# Patient Record
Sex: Female | Born: 1965
Health system: Southern US, Community
[De-identification: ages and names within clinical notes are randomized; demographics above are authoritative.]

## PROBLEM LIST (undated history)

## (undated) DIAGNOSIS — M1990 Unspecified osteoarthritis, unspecified site: Secondary | ICD-10-CM

## (undated) DIAGNOSIS — E079 Disorder of thyroid, unspecified: Secondary | ICD-10-CM

## (undated) DIAGNOSIS — E119 Type 2 diabetes mellitus without complications: Secondary | ICD-10-CM

---

## 1991-05-28 HISTORY — PX: APPENDECTOMY: SHX54

## 1996-05-27 HISTORY — PX: TONSILLECTOMY: SUR1361

## 2006-05-27 HISTORY — PX: KNEE ARTHROSCOPY W/ MENISCAL REPAIR: SHX1877

## 2013-05-27 HISTORY — PX: ROTATOR CUFF REPAIR: SHX139

## 2014-01-02 ENCOUNTER — Emergency Department: Payer: Self-pay | Admitting: Emergency Medicine

## 2014-02-12 ENCOUNTER — Emergency Department: Payer: Self-pay

## 2014-02-12 LAB — CBC
HCT: 42.7 % (ref 35.0–47.0)
HGB: 14.4 g/dL (ref 12.0–16.0)
MCH: 31.6 pg (ref 26.0–34.0)
MCHC: 33.7 g/dL (ref 32.0–36.0)
MCV: 94 fL (ref 80–100)
Platelet: 220 10*3/uL (ref 150–440)
RBC: 4.56 10*6/uL (ref 3.80–5.20)
RDW: 12 % (ref 11.5–14.5)
WBC: 10.6 10*3/uL (ref 3.6–11.0)

## 2014-02-12 LAB — COMPREHENSIVE METABOLIC PANEL
Albumin: 3.7 g/dL (ref 3.4–5.0)
Alkaline Phosphatase: 76 U/L
Anion Gap: 11 (ref 7–16)
BUN: 19 mg/dL — ABNORMAL HIGH (ref 7–18)
Bilirubin,Total: 0.4 mg/dL (ref 0.2–1.0)
Calcium, Total: 9.4 mg/dL (ref 8.5–10.1)
Chloride: 98 mmol/L (ref 98–107)
Co2: 23 mmol/L (ref 21–32)
Creatinine: 1.04 mg/dL (ref 0.60–1.30)
EGFR (African American): >60
EGFR (Non-African Amer.): >60
Glucose: 418 mg/dL — ABNORMAL HIGH (ref 65–99)
Osmolality: 285 (ref 275–301)
Potassium: 3.8 mmol/L (ref 3.5–5.1)
SGOT(AST): 11 U/L — ABNORMAL LOW (ref 15–37)
SGPT (ALT): 14 U/L
Sodium: 132 mmol/L — ABNORMAL LOW (ref 136–145)
Total Protein: 6.7 g/dL (ref 6.4–8.2)

## 2014-02-12 LAB — URINALYSIS, COMPLETE
Bacteria: NONE SEEN
Bilirubin,UR: NEGATIVE
Blood: NEGATIVE
Glucose,UR: >=500 mg/dL (ref 0–75)
Leukocyte Esterase: NEGATIVE
Nitrite: NEGATIVE
Ph: 5 (ref 4.5–8.0)
Protein: NEGATIVE
RBC,UR: 1 /HPF (ref 0–5)
Specific Gravity: 1.026 (ref 1.003–1.030)
Squamous Epithelial: 1
WBC UR: 1 /HPF (ref 0–5)

## 2014-02-12 LAB — T4, FREE: Free Thyroxine: 1.82 ng/dL — ABNORMAL HIGH (ref 0.76–1.46)

## 2014-02-12 LAB — TSH: Thyroid Stimulating Horm: 0.014 u[IU]/mL — ABNORMAL LOW

## 2014-02-19 ENCOUNTER — Ambulatory Visit: Payer: Self-pay | Admitting: Specialist

## 2014-03-09 ENCOUNTER — Ambulatory Visit: Payer: Self-pay | Admitting: Specialist

## 2014-06-30 ENCOUNTER — Emergency Department: Payer: Self-pay | Admitting: Student

## 2014-06-30 LAB — CBC
HCT: 45.2 % (ref 35.0–47.0)
HGB: 15.2 g/dL (ref 12.0–16.0)
MCH: 31.9 pg (ref 26.0–34.0)
MCHC: 33.6 g/dL (ref 32.0–36.0)
MCV: 95 fL (ref 80–100)
Platelet: 226 10*3/uL (ref 150–440)
RBC: 4.77 10*6/uL (ref 3.80–5.20)
RDW: 12.9 % (ref 11.5–14.5)
WBC: 9 10*3/uL (ref 3.6–11.0)

## 2014-06-30 LAB — BASIC METABOLIC PANEL
Anion Gap: 9 (ref 7–16)
BUN: 20 mg/dL — ABNORMAL HIGH (ref 7–18)
Calcium, Total: 9.1 mg/dL (ref 8.5–10.1)
Chloride: 102 mmol/L (ref 98–107)
Co2: 24 mmol/L (ref 21–32)
Creatinine: 0.79 mg/dL (ref 0.60–1.30)
EGFR (African American): >60
EGFR (Non-African Amer.): >60
Glucose: 335 mg/dL — ABNORMAL HIGH (ref 65–99)
Osmolality: 286 (ref 275–301)
Potassium: 4.6 mmol/L (ref 3.5–5.1)
Sodium: 135 mmol/L — ABNORMAL LOW (ref 136–145)

## 2014-06-30 LAB — TROPONIN I
Troponin-I: <0.02 ng/mL
Troponin-I: <0.02 ng/mL

## 2014-06-30 LAB — HCG, QUANTITATIVE, PREGNANCY: Beta Hcg, Quant.: 4 m[IU]/mL — ABNORMAL HIGH

## 2014-09-17 NOTE — Op Note (Signed)
PATIENT NAME:  Tanya Graves, Tanya Graves MR#:  [id number] DATE OF BIRTH:  1965/07/04  DATE OF PROCEDURE:  03/09/2014  PREOPERATIVE DIAGNOSIS: Large tear of the right rotator cuff supraspinatus and infraspinatus following anterior right shoulder dislocation 2 months ago.   OPERATION: Mini open right rotator cuff repair with arthroscopic debridement aunt of the shoulder.   SURGEON: Park Breed, MD   ANESTHESIA: General endotracheal plus interscalene block.   COMPLICATIONS: None.   DRAINS: None.   ESTIMATED BLOOD LOSS: Minimal.   REPLACEMENTS: None.   OPERATIVE FINDINGS: The patient had a very large tear of the supraspinatus and infraspinatus tendons. They were slightly retracted. The bone quality in the greater tuberosity was extremely poor and would not hold anchors well. The joint showed an intact biceps tendon and intact glenohumeral surfaces. The labrum was intact anteriorly. There was some bursitis. The coracoacromial ligament was intact.   OPERATIVE PROCEDURE: The patient was brought to the Operating Room where she underwent satisfactory general endotracheal anesthesia as well as an interscalene block on her right. She was placed in the left lateral decubitus position and padded appropriately. The right shoulder was prepped and draped in sterile fashion. Arthroscopy was carried out from a posterior portal with accessory portals being made laterally and anteriorly. The above findings were encountered on thorough arthroscopy of the joint and the subacromial space. The subacromial space was prepared with a motorized resector and ArthroCare to remove bursal tissue. The 3 Magnum wires were introduced into the rotator cuff and brought out the anterior portal. The bur was used to gently debride the greater tuberosity. We then attempted to introduce the speed screw tamp into the bone of the greater tuberosity. This proved to be fruitless as the bone was too to hold the tamps well. We attempted to put 1  speed screw in posteriorly, but it would not hold and it was removed. At this point, I elected to proceed with a mini open procedure and extended the anterolateral incision proximally and distally. The muscle was spread exposing the lateral aspect of the greater tuberosity. Further bursectomy was carried out. The 3 sutures in the rotator cuff were brought out laterally. Using another Magnum wire posteriorly woven through the tendon, all 3 of these sutures were then brought out through bone tunnels laterally over the good hard bone of the lateral portion of the greater tuberosity. The traction was reduced to 5 pounds. All 3 sutures were then tied over the lateral portion of the greater tuberosity bringing the rotator cuff down nicely. The wound was thoroughly irrigated. There was good coverage and return of the rotator cuff to the tuberosity region. The fascia was closed with 0 Vicryl and the subcutaneous tissue was closed with 2-0 Vicryl. All skin was closed with staples. Marcaine 0.5 with epinephrine and morphine was placed in the joint. Dry sterile dressing with TENS pads was applied. A padded and sling was applied. The patient was awakened and taken to recovery in good condition.    ____________________________ Park Breed, MD hem:TT D: 03/09/2014 11:30:35 ET T: 03/09/2014 14:28:11 ET JOB#: [id number]  cc: Park Breed, MD, <Dictator> Park Breed MD ELECTRONICALLY SIGNED 03/09/2014 22:24

## 2015-01-22 ENCOUNTER — Inpatient Hospital Stay
Admission: EM | Admit: 2015-01-22 | Discharge: 2015-01-25 | DRG: 854 | Disposition: A | Discharge status: Home / Self Care | Payer: 59 | Attending: Internal Medicine | Admitting: Internal Medicine

## 2015-01-22 ENCOUNTER — Encounter: Payer: Self-pay | Admitting: Emergency Medicine

## 2015-01-22 DIAGNOSIS — F17210 Nicotine dependence, cigarettes, uncomplicated: Secondary | ICD-10-CM | POA: Diagnosis present

## 2015-01-22 DIAGNOSIS — E039 Hypothyroidism, unspecified: Secondary | ICD-10-CM | POA: Diagnosis present

## 2015-01-22 DIAGNOSIS — E079 Disorder of thyroid, unspecified: Secondary | ICD-10-CM | POA: Diagnosis present

## 2015-01-22 DIAGNOSIS — Z79899 Other long term (current) drug therapy: Secondary | ICD-10-CM

## 2015-01-22 DIAGNOSIS — R739 Hyperglycemia, unspecified: Secondary | ICD-10-CM | POA: Diagnosis present

## 2015-01-22 DIAGNOSIS — Z794 Long term (current) use of insulin: Secondary | ICD-10-CM | POA: Diagnosis not present

## 2015-01-22 DIAGNOSIS — A419 Sepsis, unspecified organism: Principal | ICD-10-CM | POA: Diagnosis present

## 2015-01-22 DIAGNOSIS — E1165 Type 2 diabetes mellitus with hyperglycemia: Secondary | ICD-10-CM | POA: Diagnosis present

## 2015-01-22 DIAGNOSIS — Z833 Family history of diabetes mellitus: Secondary | ICD-10-CM | POA: Diagnosis not present

## 2015-01-22 DIAGNOSIS — E108 Type 1 diabetes mellitus with unspecified complications: Secondary | ICD-10-CM

## 2015-01-22 DIAGNOSIS — Z9889 Other specified postprocedural states: Secondary | ICD-10-CM

## 2015-01-22 DIAGNOSIS — Z9049 Acquired absence of other specified parts of digestive tract: Secondary | ICD-10-CM | POA: Diagnosis present

## 2015-01-22 DIAGNOSIS — L02415 Cutaneous abscess of right lower limb: Secondary | ICD-10-CM

## 2015-01-22 DIAGNOSIS — E119 Type 2 diabetes mellitus without complications: Secondary | ICD-10-CM | POA: Diagnosis not present

## 2015-01-22 DIAGNOSIS — L02214 Cutaneous abscess of groin: Secondary | ICD-10-CM | POA: Diagnosis present

## 2015-01-22 DIAGNOSIS — L02215 Cutaneous abscess of perineum: Secondary | ICD-10-CM | POA: Diagnosis present

## 2015-01-22 DIAGNOSIS — L03115 Cellulitis of right lower limb: Secondary | ICD-10-CM | POA: Diagnosis present

## 2015-01-22 DIAGNOSIS — Z8249 Family history of ischemic heart disease and other diseases of the circulatory system: Secondary | ICD-10-CM

## 2015-01-22 HISTORY — DX: Disorder of thyroid, unspecified: E07.9

## 2015-01-22 HISTORY — DX: Type 2 diabetes mellitus without complications: E11.9

## 2015-01-22 LAB — LACTIC ACID, PLASMA: Lactic Acid, Venous: 1.4 mmol/L (ref 0.5–2.0)

## 2015-01-22 LAB — URINALYSIS COMPLETE WITH MICROSCOPIC (ARMC ONLY)
Bacteria, UA: NONE SEEN
Bilirubin Urine: NEGATIVE
Glucose, UA: >500 mg/dL — AB
Leukocytes, UA: NEGATIVE
Nitrite: NEGATIVE
Protein, ur: NEGATIVE mg/dL
Specific Gravity, Urine: 1.033 — ABNORMAL HIGH (ref 1.005–1.030)
WBC, UA: NONE SEEN WBC/hpf (ref 0–5)
pH: 6 (ref 5.0–8.0)

## 2015-01-22 LAB — BASIC METABOLIC PANEL
Anion gap: 10 (ref 5–15)
BUN: 18 mg/dL (ref 6–20)
CO2: 23 mmol/L (ref 22–32)
Calcium: 9.5 mg/dL (ref 8.9–10.3)
Chloride: 97 mmol/L — ABNORMAL LOW (ref 101–111)
Creatinine, Ser: 0.94 mg/dL (ref 0.44–1.00)
GFR calc Af Amer: >60 mL/min (ref >60)
GFR calc non Af Amer: >60 mL/min (ref >60)
Glucose, Bld: 508 mg/dL (ref 65–99)
Potassium: 4.1 mmol/L (ref 3.5–5.1)
Sodium: 130 mmol/L — ABNORMAL LOW (ref 135–145)

## 2015-01-22 LAB — CBC
HCT: 44.4 % (ref 35.0–47.0)
Hemoglobin: 15.1 g/dL (ref 12.0–16.0)
MCH: 32.1 pg (ref 26.0–34.0)
MCHC: 34.1 g/dL (ref 32.0–36.0)
MCV: 93.9 fL (ref 80.0–100.0)
Platelets: 231 10*3/uL (ref 150–440)
RBC: 4.72 MIL/uL (ref 3.80–5.20)
RDW: 12.9 % (ref 11.5–14.5)
WBC: 14.7 10*3/uL — ABNORMAL HIGH (ref 3.6–11.0)

## 2015-01-22 LAB — GLUCOSE, CAPILLARY: Glucose-Capillary: 519 mg/dL — ABNORMAL HIGH (ref 65–99)

## 2015-01-22 MED ORDER — VANCOMYCIN HCL IN DEXTROSE 1-5 GM/200ML-% IV SOLN
1000.0000 mg | Freq: Once | INTRAVENOUS | Status: AC
  Administered 2015-01-22: 1000 mg via INTRAVENOUS
  Filled 2015-01-22: qty 200

## 2015-01-22 MED ORDER — LIDOCAINE-EPINEPHRINE 2 %-1:100000 IJ SOLN
1.7000 mL | Freq: Once | INTRAMUSCULAR | Status: AC
  Administered 2015-01-22: 1.7 mL via INTRADERMAL
  Filled 2015-01-22: qty 1.7

## 2015-01-22 MED ORDER — ACETAMINOPHEN 650 MG RE SUPP: 650.0000 mg | Freq: Four times a day (QID) | RECTAL | Status: DC | PRN

## 2015-01-22 MED ORDER — MORPHINE SULFATE (PF) 2 MG/ML IV SOLN
2.0000 mg | INTRAVENOUS | Status: DC | PRN
  Administered 2015-01-24 – 2015-01-25 (×3): 2 mg via INTRAVENOUS
  Filled 2015-01-22 (×3): qty 1

## 2015-01-22 MED ORDER — SODIUM CHLORIDE 0.9 % IV BOLUS (SEPSIS)
1000.0000 mL | Freq: Once | INTRAVENOUS | Status: AC
Start: 2015-01-22 — End: 2015-01-23
  Administered 2015-01-23: 1000 mL via INTRAVENOUS

## 2015-01-22 MED ORDER — HEPARIN SODIUM (PORCINE) 5000 UNIT/ML IJ SOLN
5000.0000 [IU] | Freq: Three times a day (TID) | INTRAMUSCULAR | Status: DC
  Administered 2015-01-22 – 2015-01-25 (×6): 5000 [IU] via SUBCUTANEOUS
  Filled 2015-01-22 (×7): qty 1

## 2015-01-22 MED ORDER — ACETAMINOPHEN 325 MG PO TABS: 650.0000 mg | ORAL_TABLET | Freq: Four times a day (QID) | ORAL | Status: DC | PRN

## 2015-01-22 MED ORDER — SODIUM CHLORIDE 0.9 % IV SOLN
INTRAVENOUS | Status: DC
  Administered 2015-01-23 – 2015-01-24 (×6): via INTRAVENOUS

## 2015-01-22 MED ORDER — SODIUM CHLORIDE 0.9 % IV BOLUS (SEPSIS)
1000.0000 mL | Freq: Once | INTRAVENOUS | Status: AC
  Administered 2015-01-22: 1000 mL via INTRAVENOUS

## 2015-01-22 MED ORDER — SODIUM CHLORIDE 0.9 % IV BOLUS (SEPSIS)
1000.0000 mL | Freq: Once | INTRAVENOUS | Status: AC
  Administered 2015-01-23: 1000 mL via INTRAVENOUS

## 2015-01-22 MED ORDER — SODIUM CHLORIDE 0.9 % IJ SOLN
3.0000 mL | Freq: Two times a day (BID) | INTRAMUSCULAR | Status: DC
  Administered 2015-01-22 – 2015-01-24 (×2): 3 mL via INTRAVENOUS

## 2015-01-22 MED ORDER — OXYCODONE HCL 5 MG PO TABS
5.0000 mg | ORAL_TABLET | ORAL | Status: DC | PRN
  Administered 2015-01-22 – 2015-01-25 (×7): 5 mg via ORAL
  Filled 2015-01-22 (×7): qty 1

## 2015-01-22 MED ORDER — ONDANSETRON HCL 4 MG PO TABS: 4.0000 mg | ORAL_TABLET | Freq: Four times a day (QID) | ORAL | Status: DC | PRN

## 2015-01-22 MED ORDER — CLINDAMYCIN PHOSPHATE 600 MG/50ML IV SOLN
600.0000 mg | Freq: Once | INTRAVENOUS | Status: AC
  Administered 2015-01-22: 600 mg via INTRAVENOUS
  Filled 2015-01-22: qty 50

## 2015-01-22 MED ORDER — ONDANSETRON HCL 4 MG/2ML IJ SOLN: 4.0000 mg | Freq: Four times a day (QID) | INTRAMUSCULAR | Status: DC | PRN

## 2015-01-22 NOTE — ED Provider Notes (Signed)
[facility] Emergency Department Provider Note   ____________________________________________  Time seen: 8:45 PM I have reviewed the triage vital signs and the triage nursing note.  HISTORY  Chief Complaint Dizziness   Historian Patient  HPI Tanya Graves is a 49 y.o. female who has a history of insulin-dependent diabetes, and history of remote [place], who noticed a small area of tenderness and inflammation consistent with a boil on Friday, and a third one popped up and seems to be worsening. The pain in the right inner thigh boil is an 8 out of 10 currently. Patient mostly came in because her blood sugars were elevated today in the 500s and even after she gave herself an additional 5 units fast acting insulin, her blood sugar did not come down except into the 400s. She denies any fevers. She denies syncope or passing out, but is positive for dizziness. No chest pain shortness of breath, trouble breathing, or cough. No urinary symptoms. Symptoms are moderate.    Past Medical History  Diagnosis Date  . Diabetes mellitus without complication   . Thyroid disease     There are no active problems to display for this patient.   Past Surgical History  Procedure Laterality Date  . Rotator cuff repair    . Appendectomy      No current outpatient prescriptions on file. metformin, long-acting insulin, and short-acting insulin as needed for a sliding scale  Allergies Review of patient's allergies indicates no known allergies.  Family History  Problem Relation Age of Onset  . Diabetes Father   . Heart failure Father   . Diabetes Brother   . Diabetes Other     Social History Social History  Substance Use Topics  . Smoking status: Current Every Day Smoker -- 0.25 packs/day    Types: Cigarettes  . Smokeless tobacco: None  . Alcohol Use: No    Review of Systems  Constitutional: Negative for fever. Eyes: Negative for visual changes. ENT: Negative  for sore throat. Cardiovascular: Negative for chest pain. Respiratory: Negative for shortness of breath. Gastrointestinal: Negative for abdominal pain, vomiting and diarrhea. Genitourinary: Negative for dysuria. Musculoskeletal: Negative for back pain. Skin: Positive for boils in the groin. Neurological: Negative for headache. 10 point Review of Systems otherwise negative ____________________________________________   PHYSICAL EXAM:  VITAL SIGNS: ED Triage Vitals  Enc Vitals Group     BP 01/22/15 2024 135/90 mmHg     Pulse Rate 01/22/15 2024 128     Resp 01/22/15 2024 18     Temp 01/22/15 2024 98.2 F (36.8 C)     Temp src --      SpO2 01/22/15 2024 96 %     Weight 01/22/15 2024 271 lb (122.925 kg)     Height 01/22/15 2024 5\' 8"  (1.727 m)     Head Cir --      Peak Flow --      Pain Score 01/22/15 2025 8     Pain Loc --      Pain Edu? --      Excl. in [place]? --      Constitutional: Alert and oriented. Anxious. Well appearing and overall, and in no distress. Eyes: Conjunctivae are normal. PERRL. Normal extraocular movements. ENT   Head: Normocephalic and atraumatic.   Nose: No congestion/rhinnorhea.   Mouth/Throat: Mucous membranes are moist.   Neck: No stridor. Cardiovascular/Chest: Tachycardic, regular.  No murmurs, rubs, or gallops. Respiratory: Normal respiratory effort without tachypnea nor retractions. Breath sounds  are clear and equal bilaterally. No wheezes/rales/rhonchi. Gastrointestinal: Soft. No distention, no guarding, no rebound. Nontender   Genitourinary/rectal: Two tiny boils which are draining on the left groin. Right inner groin area of fluctuance and pointing with some surrounding tenderness. No additional skin rash. No crepitus. Musculoskeletal: Nontender with normal range of motion in all extremities. No joint effusions.  No lower extremity tenderness.  No edema. Neurologic:  Normal speech and language. No gross or focal neurologic deficits  are appreciated. Skin:  Skin is warm, dry and intact. No rash noted. Psychiatric: Mood and affect are normal. Speech and behavior are normal. Patient exhibits appropriate insight and judgment.  ____________________________________________   EKG I, Lisa Roca, MD, the attending physician have personally viewed and interpreted all ECGs.  Sinus tachycardia. On the trauma beats per minute. Narrow QRS. Normal axis. Normal ST and T-wave. ____________________________________________  LABS (pertinent positives/negatives)  Lactic acid 1.4 Basic metabolic panel significant for glucose 508, sodium 1:30 and chloride 97 without significant other abnormality White blood cell count is 14.7, hemoglobin 15.1, platelets 231 Urinalysis 1+ ketones and negative for signs of urinary tract infection  ____________________________________________  RADIOLOGY All Xrays were viewed by me. Imaging interpreted by Radiologist.  None __________________________________________  PROCEDURES  Procedure(s) performed: Performed by Dr. Lisa Roca M.D. Abscess I&D, right inner thigh. Skin cleaned with Betadine. #11 blade incised fluctuant/pointing abscess.  Critical Care performed: None   ____________________________________________   ED COURSE / ASSESSMENT AND PLAN  CONSULTATIONS: Face-to-face with hospitalist for admission.  Pertinent labs & imaging results that were available during my care of the patient were reviewed by me and considered in my medical decision making (see chart for details).  Patient is here tachycardic and hyperglycemic with multiple small boils and one that is more extensive and lanced by me here in the emergency department. Only a small amount of pus was drained. Area of induration is relatively large and with her elevated white blood cell count, hyperglycemia and tachycardia, I'm concerned for cellulitis and/or potentially early sepsis. No evidence of DKA. Normal saline bolus was  started. Lactate is normal. Patient was started on antibiotic clindamycin.  Patient / Family / Caregiver informed of clinical course, medical decision-making process, and agree with plan.   ___________________________________________   FINAL CLINICAL IMPRESSION(S) / ED DIAGNOSES   Final diagnoses:  Hyperglycemia  Abscess of right thigh  Cellulitis of right thigh       Lisa Roca, MD 01/22/15 2239

## 2015-01-22 NOTE — ED Notes (Signed)
Pt. States she is insulin diabetic.  Pt. States reading on her machine read 467.  Pt. Also states "I have a boils to inner upper legs".

## 2015-01-22 NOTE — H&P (Signed)
Tanya Graves at [place] NAME: Tanya Graves    MR#:  [id number]  DATE OF BIRTH:  02-28-1966   DATE OF ADMISSION:  01/22/2015  PRIMARY CARE PHYSICIAN: No primary care provider on file.   REQUESTING/REFERRING PHYSICIAN: Lord  CHIEF COMPLAINT:   Chief Complaint  Patient presents with  . Dizziness    Pt. states BS is high.  Pt. states her machine read 467.  Pt. also states 3 boils on inside of upper legs.    HISTORY OF PRESENT ILLNESS:  Tanya Graves  is a 49 y.o. female with a known history of type 2 diabetes uncomplicated presenting with elevated blood glucose. She describes approximately 2-3 day duration of elevated blood glucose readings with associated increased thirst and increased urination. She also denotes having "3 boils" on the inside of her thighs. The  largest being inside of her right thigh, red, painful described as "pain" nonradiating and worse with touching no relieving factors nonradiating. Denies fevers or chills  PAST MEDICAL HISTORY:   Past Medical History  Diagnosis Date  . Diabetes mellitus without complication   . Thyroid disease     PAST SURGICAL HISTORY:   Past Surgical History  Procedure Laterality Date  . Rotator cuff repair    . Appendectomy      SOCIAL HISTORY:   Social History  Substance Use Topics  . Smoking status: Current Every Day Smoker -- 0.25 packs/day    Types: Cigarettes  . Smokeless tobacco: Not on file  . Alcohol Use: No    FAMILY HISTORY:   Family History  Problem Relation Age of Onset  . Diabetes Father   . Heart failure Father   . Diabetes Brother   . Diabetes Other     DRUG ALLERGIES:  No Known Allergies  REVIEW OF SYSTEMS:  REVIEW OF SYSTEMS:  CONSTITUTIONAL: Denies fevers, chills, fatigue, weakness.  EYES: Denies blurred vision, double vision, or eye pain.  EARS, NOSE, THROAT: Denies tinnitus, ear pain, hearing loss.  RESPIRATORY: denies cough, shortness  of breath, wheezing  CARDIOVASCULAR: Denies chest pain, palpitations, edema.  GASTROINTESTINAL: Denies nausea, vomiting, diarrhea, abdominal pain.  GENITOURINARY: Denies dysuria, hematuria.  ENDOCRINE: Denies nocturia or thyroid problems. HEMATOLOGIC AND LYMPHATIC: Denies easy bruising or bleeding.  SKIN: Skin lesions as described above Denies further rash or lesions.  MUSCULOSKELETAL: Denies pain in neck, back, shoulder, knees, hips, or further arthritic symptoms.  NEUROLOGIC: Denies paralysis, paresthesias.  PSYCHIATRIC: Denies anxiety or depressive symptoms. Otherwise full review of systems performed by me is negative.   MEDICATIONS AT HOME:   Prior to Admission medications   Medication Sig Start Date End Date Taking? Authorizing Provider  Insulin Glargine (TOUJEO SOLOSTAR [place]) Inject 45 Units into the skin every morning.   Yes Historical Provider, MD  insulin lispro (HUMALOG) 100 UNIT/ML injection Inject 4-11 Units into the skin 3 (three) times daily before meals. Sliding scale per blood sugar readings. BS100-150=4, 151-200=5, 201-250=7, 251-300=8, 301-350=9, over 350=11   Yes Historical Provider, MD  levothyroxine (SYNTHROID, LEVOTHROID) 200 MCG tablet Take 200 mcg by mouth daily before breakfast.   Yes Historical Provider, MD  metFORMIN (GLUCOPHAGE) 500 MG tablet Take 500 mg by mouth 2 (two) times daily with a meal.   Yes Historical Provider, MD      VITAL SIGNS:  Blood pressure 102/76, pulse 99, temperature 98.2 F (36.8 C), resp. rate 16, height 5\' 8"  (1.727 m), weight 271 lb (122.925 kg), last menstrual period  01/16/2015, SpO2 97 %.  PHYSICAL EXAMINATION:  VITAL SIGNS: Filed Vitals:   01/22/15 2230  BP: 102/76  Pulse: 99  Temp:   Resp: 46   GENERAL:49 y.o.female currently in no acute distress.  HEAD: Normocephalic, atraumatic.  EYES: Pupils equal, round, reactive to light. Extraocular muscles intact. No scleral icterus.  MOUTH: Moist mucosal membrane. Dentition intact.  No abscess noted.  EAR, NOSE, THROAT: Clear without exudates. No external lesions.  NECK: Supple. No thyromegaly. No nodules. No JVD.  PULMONARY: Clear to ascultation, without wheeze rails or rhonci. No use of accessory muscles, Good respiratory effort. good air entry bilaterally CHEST: Nontender to palpation.  CARDIOVASCULAR: S1 and S2. Tachycardic. No murmurs, rubs, or gallops. No edema. Pedal pulses 2+ bilaterally.  GASTROINTESTINAL: Soft, nontender, nondistended. No masses. Positive bowel sounds. No hepatosplenomegaly.  MUSCULOSKELETAL: No swelling, clubbing, or edema. Range of motion full in all extremities.  NEUROLOGIC: Cranial nerves II through XII are intact. No gross focal neurological deficits. Sensation intact. Reflexes intact.  SKIN: Erythematous lesion medial aspect of right thigh approximately 2 x 2 centimeters warm to palpation, 2 smaller lesions left thigh 1 cm x 1 cm. No further ulceration, lesions, rashes, or cyanosis. Skin warm and dry. Turgor intact.  PSYCHIATRIC: Mood, affect within normal limits. The patient is awake, alert and oriented x 3. Insight, judgment intact.    LABORATORY PANEL:   CBC  Recent Labs Lab 01/22/15 2111  WBC 14.7*  HGB 15.1  HCT 44.4  PLT 231   ------------------------------------------------------------------------------------------------------------------  Chemistries   Recent Labs Lab 01/22/15 2111  NA 130*  K 4.1  CL 97*  CO2 23  GLUCOSE 508*  BUN 18  CREATININE 0.94  CALCIUM 9.5   ------------------------------------------------------------------------------------------------------------------  Cardiac Enzymes No results for input(s): TROPONINI in the last 168 hours. ------------------------------------------------------------------------------------------------------------------  RADIOLOGY:  No results found.  EKG:   Orders placed or performed in visit on 06/30/14  . EKG 12-Lead    IMPRESSION AND PLAN:    49 year old Caucasian female history of type 2 diabetes uncomplicated presenting with elevated blood glucose as well as multiple boils  1.Sepsis, meeting septic criteria by leukocytosis, tachycardia present on arrival. Source cellulitis  Panculture. Broad-spectrum antibiotics including vancomycin and taper antibiotics when culture data returns. Continue IV fluid hydration to keep mean arterial pressure greater than 65. He may require pressor therapy if blood pressure worsens. We will repeat lactic acid given the initial is greater than 2.2.  2. Type 2 diabetes insulin requiring poorly controlled: IV fluid hydration, continue basal insulin, at insulin sliding scale with Accu-Cheks 3. Hypothyroidism unspecified: Synthroid 4. Venous thromboembolism prophylactic: Heparin subcutaneous     All the records are reviewed and case discussed with ED provider. Management plans discussed with the patient, family and they are in agreement.  CODE STATUS: Full  TOTAL TIME TAKING CARE OF THIS PATIENT: 35 minutes.    Hower,  Karenann Cai.D on 01/22/2015 at 11:18 PM  Between 7am to 6pm - Pager - [phone]  After 6pm: House Pager: - [phone]  [facility]  Office  [phone]  CC: Primary care physician; No primary care provider on file.

## 2015-01-22 NOTE — ED Notes (Signed)
MD at bedside. 

## 2015-01-22 NOTE — ED Notes (Signed)
BS in triage. [place]

## 2015-01-23 DIAGNOSIS — L02415 Cutaneous abscess of right lower limb: Secondary | ICD-10-CM

## 2015-01-23 DIAGNOSIS — R739 Hyperglycemia, unspecified: Secondary | ICD-10-CM

## 2015-01-23 DIAGNOSIS — L03115 Cellulitis of right lower limb: Secondary | ICD-10-CM

## 2015-01-23 DIAGNOSIS — E119 Type 2 diabetes mellitus without complications: Secondary | ICD-10-CM

## 2015-01-23 LAB — BASIC METABOLIC PANEL
Anion gap: 7 (ref 5–15)
BUN: 15 mg/dL (ref 6–20)
CO2: 24 mmol/L (ref 22–32)
Calcium: 8.2 mg/dL — ABNORMAL LOW (ref 8.9–10.3)
Chloride: 103 mmol/L (ref 101–111)
Creatinine, Ser: 0.65 mg/dL (ref 0.44–1.00)
GFR calc Af Amer: >60 mL/min (ref >60)
GFR calc non Af Amer: >60 mL/min (ref >60)
Glucose, Bld: 286 mg/dL — ABNORMAL HIGH (ref 65–99)
Potassium: 3.8 mmol/L (ref 3.5–5.1)
Sodium: 134 mmol/L — ABNORMAL LOW (ref 135–145)

## 2015-01-23 LAB — GLUCOSE, CAPILLARY
Glucose-Capillary: 184 mg/dL — ABNORMAL HIGH (ref 65–99)
Glucose-Capillary: 246 mg/dL — ABNORMAL HIGH (ref 65–99)
Glucose-Capillary: 253 mg/dL — ABNORMAL HIGH (ref 65–99)
Glucose-Capillary: 279 mg/dL — ABNORMAL HIGH (ref 65–99)
Glucose-Capillary: 314 mg/dL — ABNORMAL HIGH (ref 65–99)

## 2015-01-23 LAB — CBC
HCT: 38.9 % (ref 35.0–47.0)
Hemoglobin: 13.6 g/dL (ref 12.0–16.0)
MCH: 32.7 pg (ref 26.0–34.0)
MCHC: 34.9 g/dL (ref 32.0–36.0)
MCV: 93.6 fL (ref 80.0–100.0)
Platelets: 204 10*3/uL (ref 150–440)
RBC: 4.16 MIL/uL (ref 3.80–5.20)
RDW: 12.6 % (ref 11.5–14.5)
WBC: 12.3 10*3/uL — ABNORMAL HIGH (ref 3.6–11.0)

## 2015-01-23 LAB — LACTIC ACID, PLASMA: Lactic Acid, Venous: 0.9 mmol/L (ref 0.5–2.0)

## 2015-01-23 LAB — HEMOGLOBIN A1C: Hgb A1c MFr Bld: 11.6 % — ABNORMAL HIGH (ref 4.0–6.0)

## 2015-01-23 MED ORDER — INSULIN GLARGINE 100 UNIT/ML ~~LOC~~ SOLN
45.0000 [IU] | Freq: Every morning | SUBCUTANEOUS | Status: DC
  Administered 2015-01-23: 45 [IU] via SUBCUTANEOUS
  Filled 2015-01-23 (×2): qty 0.45

## 2015-01-23 MED ORDER — INSULIN GLARGINE 300 UNIT/ML ~~LOC~~ SOPN: 45.0000 [IU] | PEN_INJECTOR | Freq: Every morning | SUBCUTANEOUS | Status: DC

## 2015-01-23 MED ORDER — VANCOMYCIN HCL 10 G IV SOLR
1500.0000 mg | Freq: Two times a day (BID) | INTRAVENOUS | Status: DC
  Administered 2015-01-23 – 2015-01-24 (×4): 1500 mg via INTRAVENOUS
  Filled 2015-01-23 (×5): qty 1500

## 2015-01-23 MED ORDER — INSULIN ASPART 100 UNIT/ML ~~LOC~~ SOLN
0.0000 [IU] | Freq: Three times a day (TID) | SUBCUTANEOUS | Status: DC
  Administered 2015-01-23 (×2): 8 [IU] via SUBCUTANEOUS
  Administered 2015-01-23: 5 [IU] via SUBCUTANEOUS
  Administered 2015-01-24 (×2): 3 [IU] via SUBCUTANEOUS
  Filled 2015-01-23: qty 5
  Filled 2015-01-23: qty 8
  Filled 2015-01-23: qty 5
  Filled 2015-01-23: qty 8
  Filled 2015-01-23 (×2): qty 3

## 2015-01-23 MED ORDER — INSULIN ASPART 100 UNIT/ML ~~LOC~~ SOLN
5.0000 [IU] | Freq: Three times a day (TID) | SUBCUTANEOUS | Status: DC
  Administered 2015-01-23 – 2015-01-25 (×6): 5 [IU] via SUBCUTANEOUS
  Filled 2015-01-23 (×5): qty 5

## 2015-01-23 MED ORDER — LEVOTHYROXINE SODIUM 200 MCG PO TABS
200.0000 ug | ORAL_TABLET | Freq: Every day | ORAL | Status: DC
  Administered 2015-01-23 – 2015-01-25 (×3): 200 ug via ORAL
  Filled 2015-01-23 (×3): qty 1

## 2015-01-23 MED ORDER — INSULIN ASPART 100 UNIT/ML ~~LOC~~ SOLN
0.0000 [IU] | Freq: Every day | SUBCUTANEOUS | Status: DC
  Administered 2015-01-23: 4 [IU] via SUBCUTANEOUS
  Filled 2015-01-23: qty 4

## 2015-01-23 MED ORDER — INSULIN GLARGINE 100 UNIT/ML ~~LOC~~ SOLN
52.0000 [IU] | Freq: Every morning | SUBCUTANEOUS | Status: DC
  Administered 2015-01-24 – 2015-01-25 (×2): 52 [IU] via SUBCUTANEOUS
  Filled 2015-01-23 (×3): qty 0.52

## 2015-01-23 NOTE — ED Notes (Signed)
CBG value of 322

## 2015-01-23 NOTE — Progress Notes (Signed)
[place] at [place] NAME: Tanya Graves    MR#:  [id number]  DATE OF BIRTH:  1966/02/03  SUBJECTIVE:  CHIEF COMPLAINT:  Patient is still complaining of severe pain in the right groin area   REVIEW OF SYSTEMS:  CONSTITUTIONAL: No fever, fatigue or weakness.  EYES: No blurred or double vision.  EARS, NOSE, AND THROAT: No tinnitus or ear pain.  RESPIRATORY: No cough, shortness of breath, wheezing or hemoptysis.  CARDIOVASCULAR: No chest pain, orthopnea, edema.  GASTROINTESTINAL: No nausea, vomiting, diarrhea or abdominal pain.  GENITOURINARY: No dysuria, hematuria.  ENDOCRINE: No polyuria, nocturia,  HEMATOLOGY: No anemia, easy bruising or bleeding SKIN: No rash or lesion. MUSCULOSKELETAL: No joint pain or arthritis.  Pain and swelling in the right thigh area NEUROLOGIC: No tingling, numbness, weakness.  PSYCHIATRY: No anxiety or depression.   DRUG ALLERGIES:  No Known Allergies  VITALS:  Blood pressure 117/77, pulse 80, temperature 98.1 F (36.7 C), temperature source Oral, resp. rate 18, height 5\' 8"  (1.727 m), weight 122.925 kg (271 lb), last menstrual period 01/16/2015, SpO2 95 %.  PHYSICAL EXAMINATION:  GENERAL:  49 y.o.-year-old patient lying in the bed with no acute distress.  EYES: Pupils equal, round, reactive to light and accommodation. No scleral icterus. Extraocular muscles intact.  HEENT: Head atraumatic, normocephalic. Oropharynx and nasopharynx clear.  NECK:  Supple, no jugular venous distention. No thyroid enlargement, no tenderness.  LUNGS: Normal breath sounds bilaterally, no wheezing, rales,rhonchi or crepitation. No use of accessory muscles of respiration.  CARDIOVASCULAR: S1, S2 normal. No murmurs, rubs, or gallops.  ABDOMEN: Soft, nontender, nondistended. Bowel sounds present. No organomegaly or mass.  EXTREMITIES: No pedal edema, cyanosis, or clubbing. Right thigh with erythema, induration some fluctuance  and tender to touch. The left eye area with healing furuncle NEUROLOGIC: Cranial nerves II through XII are intact. Muscle strength 5/5 in all extremities. Sensation intact. Gait not checked.  PSYCHIATRIC: The patient is alert and oriented x 3.  SKIN: No obvious rash, lesion, or ulcer.    LABORATORY PANEL:   CBC  Recent Labs Lab 01/23/15 0115  WBC 12.3*  HGB 13.6  HCT 38.9  PLT 204   ------------------------------------------------------------------------------------------------------------------  Chemistries   Recent Labs Lab 01/23/15 0115  NA 134*  K 3.8  CL 103  CO2 24  GLUCOSE 286*  BUN 15  CREATININE 0.65  CALCIUM 8.2*   ------------------------------------------------------------------------------------------------------------------  Cardiac Enzymes No results for input(s): TROPONINI in the last 168 hours. ------------------------------------------------------------------------------------------------------------------  RADIOLOGY:  No results found.  EKG:   Orders placed or performed during the hospital encounter of 01/22/15  . EKG 12-Lead  . EKG 12-Lead    ASSESSMENT AND PLAN:   49 year old Caucasian female history of type 2 diabetes uncomplicated presenting with elevated blood glucose as well as multiple boils  1.Sepsis, meeting septic criteria by leukocytosis, tachycardia present on arrival. Source cellulitis Follow-up Panculture.  Broad-spectrum antibiotics including vancomycin and taper antibiotics when culture data returns.  Continue IV fluid hydration to keep mean arterial pressure greater than 65. Surgical consult is placed or performed possible incision and drainage Pain management with morphine as needed basis  2. Type 2 diabetes insulin requiring poorly controlled: Lantus is increased to 52 units Continue IV fluid hydration, continue basal insulin, at insulin sliding scale with Accu-Cheks 3. Hypothyroidism unspecified: Synthroid 4.  Venous thromboembolism prophylactic: Heparin subcutaneous    All the records are reviewed and case discussed with Care Management/Social Workerr. Management plans discussed  with the patient, family and they are in agreement.  CODE STATUS: Full code  TOTAL TIME TAKING CARE OF THIS PATIENT: 35 minutes.   POSSIBLE D/C IN  1-2 DAYS, DEPENDING ON CLINICAL CONDITION.   Nicholes Mango M.D on 01/23/2015 at 3:03 PM  Between 7am to 6pm - Pager - [phone] After 6pm go to [email or web address] - password [email or web address] [facility]  [place] [facility]  Office  [phone]  CC: Primary care physician; No primary care provider on file.

## 2015-01-23 NOTE — Progress Notes (Signed)
ANTIBIOTIC CONSULT NOTE - INITIAL  Pharmacy Consult for vancomycin dosing Indication: cellulitis  No Known Allergies  Patient Measurements: Height: 5\' 8"  (172.7 cm) Weight: 271 lb (122.925 kg) IBW/kg (Calculated) : 63.9 Adjusted Body Weight: 88kg  Vital Signs: Temp: 98.2 F (36.8 C) (08/28 2024) BP: 105/69 mmHg (08/28 2321) Pulse Rate: 105 (08/28 2321) Intake/Output from previous day:   Intake/Output from this shift:    Labs:  Recent Labs  01/22/15 2111  WBC 14.7*  HGB 15.1  PLT 231  CREATININE 0.94   Estimated Creatinine Clearance: 101.1 mL/min (by C-G formula based on Cr of 0.94). No results for input(Graves): VANCOTROUGH, VANCOPEAK, VANCORANDOM, GENTTROUGH, GENTPEAK, GENTRANDOM, TOBRATROUGH, TOBRAPEAK, TOBRARND, AMIKACINPEAK, AMIKACINTROU, AMIKACIN in the last 72 hours.   Microbiology: No results found for this or any previous visit (from the past 720 hour(Graves)).  Medical History: Past Medical History  Diagnosis Date  . Diabetes mellitus without complication   . Thyroid disease     Medications:   Assessment: UA: (-)  Blood cx pending  Goal of Therapy:  Vancomycin trough level 15-20 mcg/ml  Plan:  TBW 122.9kg  IBW 63.9kg  DW 88kg Vd 62L kei 0.089 hr-1  t1/2 8 hours 1 gram given in ED. 1500 mg IV q 12 hours ordered as continuation. Level before 5th dose.  Tanya Graves 01/23/2015,12:08 AM

## 2015-01-23 NOTE — Consult Note (Signed)
General Surgery Consult Note  CC: Right groin pain, hyperglycemia x 3 day  HPI: [name] is a pleasant 49 yo F with a history of DM who presents with worsening right medial groin nodule/abscess.  Small incision has been placed into it in ER, some purulence.  Also with BG in 4-600 range.  Has never had before, does have two smaller nodules on left medial thigh.  No fevers/chills, night sweats, shortness of breath, cough, chest pain, abdominal pain, nausea/vomiting, diarrhea/constipation, dysuria/hematuria.  Active Ambulatory Problems    Diagnosis Date Noted  . No Active Ambulatory Problems   Resolved Ambulatory Problems    Diagnosis Date Noted  . No Resolved Ambulatory Problems   Past Medical History  Diagnosis Date  . Diabetes mellitus without complication   . Thyroid disease    Past Surgical History  Procedure Laterality Date  . Rotator cuff repair    . Appendectomy    . Knee arthroscopy w/ meniscal repair Right 2008     Medication List    ASK your doctor about these medications        insulin lispro 100 UNIT/ML injection  Commonly known as:  HUMALOG  Inject 4-11 Units into the skin 3 (three) times daily before meals. Sliding scale per blood sugar readings. BS100-150=4, 151-200=5, 201-250=7, 251-300=8, 301-350=9, over 350=11     levothyroxine 200 MCG tablet  Commonly known as:  SYNTHROID, LEVOTHROID  Take 200 mcg by mouth daily before breakfast.     metFORMIN 500 MG tablet  Commonly known as:  GLUCOPHAGE  Take 500 mg by mouth 2 (two) times daily with a meal.     TOUJEO SOLOSTAR [place]  Inject 45 Units into the skin every morning.       No Known Allergies   Social History   Social History  . Marital Status: Single    Spouse Name: N/A  . Number of Children: N/A  . Years of Education: N/A   Occupational History  . Not on file.   Social History Main Topics  . Smoking status: Current Every Day Smoker -- 0.25 packs/day    Types: Cigarettes  . Smokeless tobacco:  Not on file  . Alcohol Use: No  . Drug Use: Not on file  . Sexual Activity: Not on file   Other Topics Concern  . Not on file   Social History Narrative   Family History  Problem Relation Age of Onset  . Diabetes Father   . Heart failure Father   . Diabetes Brother   . Diabetes Other    ROS: Full ROS obtained, pertinent positives and negatives as above.  Blood pressure 117/77, pulse 80, temperature 98.1 F (36.7 C), temperature source Oral, resp. rate 18, height 5\' 8"  (1.727 m), weight 271 lb (122.925 kg), last menstrual period 01/16/2015, SpO2 95 %. GEN: NAD/A&Ox3 FACE: no obvious facial trauma, normal external nose, normal external ears EYES: no scleral icterus, no conjunctivitis HEAD: normocephalic atraumatic CV: RRR, no MRG RESP: moving air well, lungs clear ABD: soft, nontender, nondistended EXT: moving all ext well, strength 5/5, approx 5 x 2 cm nodule with purulence right groin, approx 1 x 1 cm nodules x 2 on left medial thigh NEURO: cnII-XII grossly intact, sensation intact all 4 ext  Labs: Personally reviewed, significant for WBC 12.3, BG in 200s  A/P 49 yo with groin abscess - cont abx - will plan on OR tomorrow for I and D of groin abscess.  I have spoken with patient regarding risks  and benefits of procedure and she would like to proceed.

## 2015-01-23 NOTE — Progress Notes (Signed)
Inpatient Diabetes Program Recommendations  AACE/ADA: New Consensus Statement on Inpatient Glycemic Control (2013)  Target Ranges:  Prepandial:   less than 140 mg/dL      Peak postprandial:   less than 180 mg/dL (1-2 hours)      Critically ill patients:  140 - 180 mg/dL   Review of Glycemic control: Results for ORRIE, LASCANO (MRN [id number]) as of 01/23/2015 12:49  Ref. Range 01/22/2015 20:29 01/23/2015 00:41 01/23/2015 07:30 01/23/2015 11:28  Glucose-Capillary Latest Ref Range: 65-99 mg/dL 519 (H) 314 (H) 279 (H) 246 (H)    Diabetes history: Type 2 Outpatient Diabetes medications: Toujeo 45 units q HS, Humalog quikpen, Metformin Current orders for Inpatient glycemic control:  Lantus 45 units q AM, Novolog moderate tid with meals and HS  Spoke with patient regarding home diabetes management.  She states that her A1C had been high in the past, however she states that the last 3 months (since being on Toujeo) they have improved.  She states she knew something was wrong when her blood sugars were elevated regarding the "boil" on her leg.  She has lost 93 pounds over the past 2 1/2 years using [employer] and recently rejoined in March.    A1C pending.  May need increased in basal insulin while in the hospital.  Consider Lantus 52 units q HS.  Also consider adding Novolog meal coverage 5 units tid with meals (hold if patient eats less than 50%).  Thanks, Adah Perl, RN, BC-ADM Inpatient Diabetes Coordinator Pager [phone] (8a-5p)

## 2015-01-24 ENCOUNTER — Inpatient Hospital Stay: Payer: 59 | Admitting: Anesthesiology

## 2015-01-24 ENCOUNTER — Encounter: Payer: Self-pay | Admitting: Surgery

## 2015-01-24 ENCOUNTER — Encounter
Admission: EM | Disposition: A | Discharge status: Home / Self Care | Payer: Self-pay | Source: Home / Self Care | Attending: Internal Medicine

## 2015-01-24 HISTORY — PX: IRRIGATION AND DEBRIDEMENT ABSCESS: SHX5252

## 2015-01-24 LAB — BASIC METABOLIC PANEL
Anion gap: 3 — ABNORMAL LOW (ref 5–15)
BUN: 9 mg/dL (ref 6–20)
CO2: 27 mmol/L (ref 22–32)
Calcium: 8.3 mg/dL — ABNORMAL LOW (ref 8.9–10.3)
Chloride: 106 mmol/L (ref 101–111)
Creatinine, Ser: 0.55 mg/dL (ref 0.44–1.00)
GFR calc Af Amer: >60 mL/min (ref >60)
GFR calc non Af Amer: >60 mL/min (ref >60)
Glucose, Bld: 267 mg/dL — ABNORMAL HIGH (ref 65–99)
Potassium: 4.4 mmol/L (ref 3.5–5.1)
Sodium: 136 mmol/L (ref 135–145)

## 2015-01-24 LAB — CBC
HCT: 40.3 % (ref 35.0–47.0)
Hemoglobin: 13.7 g/dL (ref 12.0–16.0)
MCH: 32.9 pg (ref 26.0–34.0)
MCHC: 34 g/dL (ref 32.0–36.0)
MCV: 96.5 fL (ref 80.0–100.0)
Platelets: 220 10*3/uL (ref 150–440)
RBC: 4.18 MIL/uL (ref 3.80–5.20)
RDW: 12.7 % (ref 11.5–14.5)
WBC: 7.9 10*3/uL (ref 3.6–11.0)

## 2015-01-24 LAB — MRSA PCR SCREENING: MRSA by PCR: NEGATIVE

## 2015-01-24 LAB — GLUCOSE, CAPILLARY
Glucose-Capillary: 238 mg/dL — ABNORMAL HIGH (ref 65–99)
Glucose-Capillary: 180 mg/dL — ABNORMAL HIGH (ref 65–99)
Glucose-Capillary: 177 mg/dL — ABNORMAL HIGH (ref 65–99)
Glucose-Capillary: 147 mg/dL — ABNORMAL HIGH (ref 65–99)

## 2015-01-24 LAB — HEMOGLOBIN A1C: Hgb A1c MFr Bld: 11.4 % — ABNORMAL HIGH (ref 4.0–6.0)

## 2015-01-24 LAB — VANCOMYCIN, TROUGH: Vancomycin Tr: 10 ug/mL (ref 10–20)

## 2015-01-24 LAB — PREGNANCY, URINE: Preg Test, Ur: NEGATIVE

## 2015-01-24 SURGERY — IRRIGATION AND DEBRIDEMENT ABSCESS
Anesthesia: General | Site: Groin | Wound class: Clean Contaminated

## 2015-01-24 MED ORDER — PROPOFOL 10 MG/ML IV BOLUS
INTRAVENOUS | Status: DC | PRN
  Administered 2015-01-24: 200 mg via INTRAVENOUS

## 2015-01-24 MED ORDER — FENTANYL CITRATE (PF) 100 MCG/2ML IJ SOLN
INTRAMUSCULAR | Status: DC | PRN
  Administered 2015-01-24: 100 ug via INTRAVENOUS

## 2015-01-24 MED ORDER — MIDAZOLAM HCL 2 MG/2ML IJ SOLN
INTRAMUSCULAR | Status: DC | PRN
  Administered 2015-01-24: 2 mg via INTRAVENOUS

## 2015-01-24 MED ORDER — KETOROLAC TROMETHAMINE 30 MG/ML IJ SOLN
INTRAMUSCULAR | Status: DC | PRN
  Administered 2015-01-24: 30 mg via INTRAVENOUS

## 2015-01-24 MED ORDER — LIDOCAINE HCL (CARDIAC) 20 MG/ML IV SOLN
INTRAVENOUS | Status: DC | PRN
  Administered 2015-01-24: 100 mg via INTRAVENOUS

## 2015-01-24 MED ORDER — GLYCOPYRROLATE 0.2 MG/ML IJ SOLN
INTRAMUSCULAR | Status: DC | PRN
  Administered 2015-01-24: 0.3 mg via INTRAVENOUS

## 2015-01-24 MED ORDER — ONDANSETRON HCL 4 MG/2ML IJ SOLN: 4.0000 mg | Freq: Once | INTRAMUSCULAR | Status: DC | PRN

## 2015-01-24 MED ORDER — VANCOMYCIN HCL 10 G IV SOLR
1500.0000 mg | Freq: Three times a day (TID) | INTRAVENOUS | Status: DC
  Administered 2015-01-25 (×2): 1500 mg via INTRAVENOUS
  Filled 2015-01-24 (×5): qty 1500

## 2015-01-24 MED ORDER — FENTANYL CITRATE (PF) 100 MCG/2ML IJ SOLN
25.0000 ug | INTRAMUSCULAR | Status: DC | PRN
  Administered 2015-01-24 (×4): 25 ug via INTRAVENOUS

## 2015-01-24 MED ORDER — ONDANSETRON HCL 4 MG/2ML IJ SOLN
INTRAMUSCULAR | Status: DC | PRN
  Administered 2015-01-24: 4 mg via INTRAVENOUS

## 2015-01-24 MED ORDER — KETAMINE HCL 50 MG/ML IJ SOLN
INTRAMUSCULAR | Status: DC | PRN
  Administered 2015-01-24: 25 mg via INTRAMUSCULAR

## 2015-01-24 SURGICAL SUPPLY — 20 items
BLADE SURG SZ11 CARB STEEL (BLADE) ×3 IMPLANT
BRIEF STRETCH MATERNITY 2XLG (MISCELLANEOUS) IMPLANT
CANISTER SUCT 1200ML W/VALVE (MISCELLANEOUS) ×3 IMPLANT
DRAIN PENROSE 1/4X12 LTX (DRAIN) ×3 IMPLANT
DRAIN PENROSE 5/8X12 LTX STRL (DRAIN) ×3 IMPLANT
DRAPE LAPAROTOMY 100X77 ABD (DRAPES) ×3 IMPLANT
DRAPE LEGGINS SURG 28X43 STRL (DRAPES) IMPLANT
DRAPE UNDER BUTTOCK W/FLU (DRAPES) IMPLANT
GAUZE SPONGE 4X4 12PLY STRL (GAUZE/BANDAGES/DRESSINGS) ×3 IMPLANT
GLOVE BIO SURGEON STRL SZ7.5 (GLOVE) ×6 IMPLANT
GLOVE INDICATOR 8.0 STRL GRN (GLOVE) ×3 IMPLANT
GOWN STRL REUS W/ TWL LRG LVL3 (GOWN DISPOSABLE) ×2 IMPLANT
GOWN STRL REUS W/TWL LRG LVL3 (GOWN DISPOSABLE) ×4
KIT RM TURNOVER CYSTO AR (KITS) ×3 IMPLANT
LABEL OR SOLS (LABEL) ×3 IMPLANT
NS IRRIG 500ML POUR BTL (IV SOLUTION) ×3 IMPLANT
PACK BASIN MINOR ARMC (MISCELLANEOUS) ×3 IMPLANT
PAD ABD DERMACEA PRESS 5X9 (GAUZE/BANDAGES/DRESSINGS) IMPLANT
PAD PREP 24X41 OB/GYN DISP (PERSONAL CARE ITEMS) IMPLANT
SWAB CULTURE AMIES ANAERIB BLU (MISCELLANEOUS) IMPLANT

## 2015-01-24 NOTE — Transfer of Care (Signed)
Immediate Anesthesia Transfer of Care Note  Patient: Tanya Graves  Procedure(s) Performed: Procedure(s): IRRIGATION AND DEBRIDEMENT ABSCESS/GROIN ABSCESS (N/A)  Patient Location: PACU  Anesthesia Type:General  Level of Consciousness: awake, alert , oriented and patient cooperative  Airway & Oxygen Therapy: Patient Spontanous Breathing and Patient connected to nasal cannula oxygen  Post-op Assessment: Report given to RN and Post -op Vital signs reviewed and stable  Post vital signs: Reviewed and stable  Last Vitals:  Filed Vitals:   01/24/15 1136  BP: 126/90  Pulse:   Temp: 36.2 C  Resp: 16    Complications: No apparent anesthesia complications

## 2015-01-24 NOTE — Progress Notes (Signed)
[name]    MR#:  [id number]  DATE OF BIRTH:  16-Oct-1965  SUBJECTIVE:  No issues overnight. Patient is going to OR for I&D.  REVIEW OF SYSTEMS:    Review of Systems  Constitutional: Negative for fever, chills and malaise/fatigue.  HENT: Negative for sore throat.   Eyes: Negative for blurred vision.  Respiratory: Negative for cough, hemoptysis, shortness of breath and wheezing.   Cardiovascular: Negative for chest pain, palpitations and leg swelling.  Gastrointestinal: Negative for nausea, vomiting, abdominal pain, diarrhea and blood in stool.  Genitourinary: Negative for dysuria.  Musculoskeletal: Negative for back pain.  Skin:       absess groin  Neurological: Negative for dizziness, tremors and headaches.  Endo/Heme/Allergies: Does not bruise/bleed easily.    Tolerating Diet: Yes      DRUG ALLERGIES:  No Known Allergies  VITALS:  Blood pressure 115/70, pulse 72, temperature 98.2 F (36.8 C), temperature source Oral, resp. rate 18, height _0  (1.727 m), weight 122.925 kg (271 lb), last menstrual period 01/16/2015, SpO2 99 %.  PHYSICAL EXAMINATION:   Physical Exam  Constitutional: She is oriented to person, place, and time and well-developed, well-nourished, and in no distress. No distress.  HENT:  Head: Normocephalic.  Eyes: No scleral icterus.  Neck: Normal range of motion. Neck supple. No JVD present. No tracheal deviation present.  Cardiovascular: Normal rate, regular rhythm and normal heart sounds.  Exam reveals no gallop and no friction rub.   No murmur heard. Pulmonary/Chest: Effort normal and breath sounds normal. No respiratory distress. She has no wheezes. She has no rales. She exhibits no tenderness.  Abdominal: Soft. Bowel sounds are normal. She exhibits no distension and no mass. There is no tenderness. There is no rebound and no guarding.  Musculoskeletal: Normal range  of motion. She exhibits no edema.  Neurological: She is alert and oriented to person, place, and time.  Skin: Skin is warm. No rash noted. No erythema.  Psychiatric: Affect and judgment normal.      LABORATORY PANEL:   CBC  Recent Labs Lab 01/24/15 0557  WBC 7.9  HGB 13.7  HCT 40.3  PLT 220   ------------------------------------------------------------------------------------------------------------------  Chemistries   Recent Labs Lab 01/24/15 0557  NA 136  K 4.4  CL 106  CO2 27  GLUCOSE 267*  BUN 9  CREATININE 0.55  CALCIUM 8.3*   ------------------------------------------------------------------------------------------------------------------  Cardiac Enzymes No results for input(s): TROPONINI in the last 168 hours. ------------------------------------------------------------------------------------------------------------------  RADIOLOGY:  No results found.   ASSESSMENT AND PLAN:   49 year old female with history of type 2 diabetes presented with hyperglycemia and multiple boils/abscess in the groin region.  1. Sepsis: Patient met criteria by leukocytosis and tachycardia on arrival. Sepsis is resolving. The sources due to these boils. Patient is on broad-spectrum antibiotics including vancomycin. Patient will go for I&D. Blood culture negative date.   2. Type 2 diabetes: Insulin requiring. I suspect her blood sugars were elevated due to her acute infection. Blood sugars have improved. Continue Lantus and sliding scale insulin along with basal insulin.  3. Hypothyroidism: Continue Synthroid     Management plans discussed with the patient and she is in agreement.  CODE STATUS: FULL  TOTAL TIME TAKING CARE OF THIS PATIENT: 25 minutes.     POSSIBLE D/C 1-2 days, DEPENDING ON CLINICAL CONDITION.   Manish Ruggiero M.D on 01/24/2015 at 11:03 AM  Between 7am to 6pm -  Pager - [phone] After 6pm go to [email or web address] - password [email or web address] [facility]  [facility]  Office  [phone]  CC: Primary care physician; No primary care provider on file.

## 2015-01-24 NOTE — Progress Notes (Signed)
ANTIBIOTIC CONSULT NOTE - Follow up  Pharmacy Consult for vancomycin dosing Indication: cellulitis/groin abscess  No Known Allergies  Patient Measurements: Height: 5\' 8"  (172.7 cm) Weight: 271 lb (122.925 kg) IBW/kg (Calculated) : 63.9 Adjusted Body Weight: 88kg  Vital Signs: Temp: 97.7 F (36.5 C) (08/30 1604) Temp Source: Oral (08/30 1604) BP: 101/58 mmHg (08/30 1604) Pulse Rate: 55 (08/30 1604)  Labs:  Recent Labs  01/22/15 2111 01/23/15 0115 01/24/15 0557  WBC 14.7* 12.3* 7.9  HGB 15.1 13.6 13.7  PLT 231 204 220  CREATININE 0.94 0.65 0.55   Estimated Creatinine Clearance: 118.8 mL/min (by C-G formula based on Cr of 0.55).  Microbiology: Recent Results (from the past 720 hour(s))  Culture, blood (routine x 2)     Status: None (Preliminary result)   Collection Time: 01/22/15  9:11 PM  Result Value Ref Range Status   Specimen Description BLOOD LEFT HAND  Final   Special Requests BOTTLES DRAWN AEROBIC AND ANAEROBIC  1CC  Final   Culture NO GROWTH 2 DAYS  Final   Report Status PENDING  Incomplete  Culture, blood (routine x 2)     Status: None (Preliminary result)   Collection Time: 01/22/15  9:12 PM  Result Value Ref Range Status   Specimen Description BLOOD RIGHT ASSIST CONTROL  Final   Special Requests BOTTLES DRAWN AEROBIC AND ANAEROBIC  1CC  Final   Culture NO GROWTH 2 DAYS  Final   Report Status PENDING  Incomplete  MRSA PCR Screening     Status: None   Collection Time: 01/24/15  4:35 AM  Result Value Ref Range Status   MRSA by PCR NEGATIVE NEGATIVE Final    Comment:        The GeneXpert MRSA Assay (FDA approved for NASAL specimens only), is one component of a comprehensive MRSA colonization surveillance program. It is not intended to diagnose MRSA infection nor to guide or monitor treatment for MRSA infections.     Medical History: Past Medical History  Diagnosis Date  . Diabetes mellitus without complication   . Thyroid disease      Medications:  Anti-infectives    Start     Dose/Rate Route Frequency Ordered Stop   01/25/15 0100  vancomycin (VANCOCIN) 1,500 mg in sodium chloride 0.9 % 500 mL IVPB     1,500 mg 250 mL/hr over 120 Minutes Intravenous Every 8 hours 01/24/15 1843     01/23/15 0500  vancomycin (VANCOCIN) 1,500 mg in sodium chloride 0.9 % 500 mL IVPB  Status:  Discontinued     1,500 mg 250 mL/hr over 120 Minutes Intravenous Every 12 hours 01/23/15 0006 01/24/15 1843   01/22/15 2300  vancomycin (VANCOCIN) IVPB 1000 mg/200 mL premix     1,000 mg 200 mL/hr over 60 Minutes Intravenous  Once 01/22/15 2257 01/23/15 0037   01/22/15 2200  clindamycin (CLEOCIN) IVPB 600 mg     600 mg 100 mL/hr over 30 Minutes Intravenous  Once 01/22/15 2200 01/22/15 2305     Assessment: 49 yo Female with Groin abscess/cellulitis/sepsis, Hx DM.  Blood cx NGTD. Vancomycin trough= 10 mcg/ml  Goal of Therapy:  Vancomycin trough level 15-20 mcg/ml  Plan:  Vancomycin trough is 10 so will increase vancomycin dose to 1500 mg iv q 8 hours and check a trough with the 5th dose of this new regimen.   Chinita Greenland PharmD Clinical Pharmacist 01/24/2015 6:44 PM

## 2015-01-24 NOTE — Anesthesia Postprocedure Evaluation (Signed)
  Anesthesia Post-op Note  Patient: Tanya Graves  Procedure(s) Performed: Procedure(s): IRRIGATION AND DEBRIDEMENT ABSCESS/GROIN ABSCESS (N/A)  Anesthesia type:General  Patient location: PACU  Post pain: Pain level controlled  Post assessment: Post-op Vital signs reviewed, Patient's Cardiovascular Status Stable, Respiratory Function Stable, Patent Airway and No signs of Nausea or vomiting  Post vital signs: Reviewed and stable  Last Vitals:  Filed Vitals:   01/24/15 1604  BP: 101/58  Pulse: 55  Temp: 36.5 C  Resp: 18    Level of consciousness: awake, alert  and patient cooperative  Complications: No apparent anesthesia complications

## 2015-01-24 NOTE — Progress Notes (Signed)
ANTIBIOTIC CONSULT NOTE - Follow up  Pharmacy Consult for vancomycin dosing Indication: cellulitis/groin abscess  No Known Allergies  Patient Measurements: Height: 5\' 8"  (172.7 cm) Weight: 271 lb (122.925 kg) IBW/kg (Calculated) : 63.9 Adjusted Body Weight: 88kg  Vital Signs: Temp: 98.2 F (36.8 C) (08/30 0737) Temp Source: Oral (08/30 0737) BP: 115/70 mmHg (08/30 0737) Pulse Rate: 72 (08/30 0737)  Labs:  Recent Labs  01/22/15 2111 01/23/15 0115 01/24/15 0557  WBC 14.7* 12.3* 7.9  HGB 15.1 13.6 13.7  PLT 231 204 220  CREATININE 0.94 0.65 0.55   Estimated Creatinine Clearance: 118.8 mL/min (by C-G formula based on Cr of 0.55).  Microbiology: Recent Results (from the past 720 hour(s))  Culture, blood (routine x 2)     Status: None (Preliminary result)   Collection Time: 01/22/15  9:11 PM  Result Value Ref Range Status   Specimen Description BLOOD LEFT HAND  Final   Special Requests BOTTLES DRAWN AEROBIC AND ANAEROBIC  1CC  Final   Culture NO GROWTH < 12 HOURS  Final   Report Status PENDING  Incomplete  Culture, blood (routine x 2)     Status: None (Preliminary result)   Collection Time: 01/22/15  9:12 PM  Result Value Ref Range Status   Specimen Description BLOOD RIGHT ASSIST CONTROL  Final   Special Requests BOTTLES DRAWN AEROBIC AND ANAEROBIC  1CC  Final   Culture NO GROWTH < 12 HOURS  Final   Report Status PENDING  Incomplete  MRSA PCR Screening     Status: None   Collection Time: 01/24/15  4:35 AM  Result Value Ref Range Status   MRSA by PCR NEGATIVE NEGATIVE Final    Comment:        The GeneXpert MRSA Assay (FDA approved for NASAL specimens only), is one component of a comprehensive MRSA colonization surveillance program. It is not intended to diagnose MRSA infection nor to guide or monitor treatment for MRSA infections.     Medical History: Past Medical History  Diagnosis Date  . Diabetes mellitus without complication   . Thyroid disease      Medications:  Anti-infectives    Start     Dose/Rate Route Frequency Ordered Stop   01/23/15 0500  vancomycin (VANCOCIN) 1,500 mg in sodium chloride 0.9 % 500 mL IVPB     1,500 mg 250 mL/hr over 120 Minutes Intravenous Every 12 hours 01/23/15 0006     01/22/15 2300  vancomycin (VANCOCIN) IVPB 1000 mg/200 mL premix     1,000 mg 200 mL/hr over 60 Minutes Intravenous  Once 01/22/15 2257 01/23/15 0037   01/22/15 2200  clindamycin (CLEOCIN) IVPB 600 mg     600 mg 100 mL/hr over 30 Minutes Intravenous  Once 01/22/15 2200 01/22/15 2305     Assessment: 49 yo Female with Groin abscess/cellulitis/sepsis, Hx DM.  Blood cx NGTD.  Goal of Therapy:  Vancomycin trough level 15-20 mcg/ml  Plan:  TBW 122.9kg  IBW 63.9kg  DW 88kg Vd 62L kei 0.089 hr-1  t1/2 8 hours 1 gram given in ED. 1500 mg IV q 12 hours ordered as continuation. Level before 5th dose on 8/30 at 1630.  8/30: Patient for I&D of abscess today. F/U for possible wound cx?  Chinita Greenland PharmD Clinical Pharmacist 01/24/2015 8:54 AM

## 2015-01-24 NOTE — Anesthesia Procedure Notes (Signed)
Procedure Name: LMA Insertion Date/Time: 01/24/2015 10:57 AM Performed by: Rosaria Ferries, Carmin Dibartolo Pre-anesthesia Checklist: Patient identified, Emergency Drugs available, Suction available and Patient being monitored Patient Re-evaluated:Patient Re-evaluated prior to inductionOxygen Delivery Method: Circle system utilized Preoxygenation: Pre-oxygenation with 100% oxygen Intubation Type: IV induction LMA: LMA inserted LMA Size: 4.0 Number of attempts: 1 Placement Confirmation: breath sounds checked- equal and bilateral

## 2015-01-24 NOTE — Progress Notes (Signed)
OR is requesting that this order be done.  Dr Ronelle Nigh requesting

## 2015-01-24 NOTE — Brief Op Note (Signed)
01/22/2015 - 01/24/2015  11:25 AM  PATIENT:  Tanya Graves  49 y.o. female  PRE-OPERATIVE DIAGNOSIS:  GROIN ABSCESS  POST-OPERATIVE DIAGNOSIS:  Groin abscess  PROCEDURE:  Procedure(s): IRRIGATION AND DEBRIDEMENT ABSCESS/GROIN ABSCESS (N/A), multiple  SURGEON:  Surgeon(s) and Role:    * [name], MD - Primary  PHYSICIAN ASSISTANT:   ASSISTANTS: none   ANESTHESIA:   general  EBL:  Total I/O In: 251 [I.V.:500; IV Piggyback:375] Out: 23 [Urine:400; Blood:10]  BLOOD ADMINISTERED:none  DRAINS: Penrose drain in the right groin subcutaneous tissue   LOCAL MEDICATIONS USED:  LIDOCAINE   SPECIMEN:  Source of Specimen:  Culture and Aspirate  DISPOSITION OF SPECIMEN:  PATHOLOGY  COUNTS:  YES  TOURNIQUET:  * No tourniquets in log *  DICTATION: .Note written in EPIC  PLAN OF CARE: Admit for overnight observation  PATIENT DISPOSITION:  PACU - hemodynamically stable.   Delay start of Pharmacological VTE agent (>24hrs) due to surgical blood loss or risk of bleeding: no

## 2015-01-24 NOTE — Progress Notes (Signed)
Surgery Progress Note  S:  Still with pain O: Blood pressure 115/70, pulse 72, temperature 98.2 F (36.8 C), temperature source Oral, resp. rate 18, height 5\' 8"  (1.727 m), weight 271 lb (122.925 kg), last menstrual period 01/16/2015, SpO2 99 %. GEN: NAD/A&Ox3 GROIN: Persistent abscess with drainage, new small abscess medial to vagina  A/P 49 yo with groin abscess - to OR for I and D today

## 2015-01-24 NOTE — Progress Notes (Addendum)
Inpatient Diabetes Program Recommendations  AACE/ADA: New Consensus Statement on Inpatient Glycemic Control (2013)  Target Ranges:  Prepandial:   less than 140 mg/dL      Peak postprandial:   less than 180 mg/dL (1-2 hours)      Critically ill patients:  140 - 180 mg/dL   Diabetes history: Type 2 Outpatient Diabetes medications: Toujeo 45 units q HS, Humalog quikpen 4-11 units tid with meals, Metformin 500mg  bid  Current orders for Inpatient glycemic control:  Lantus 52 units q AM, Novolog moderate tid with meals and HS, Novolog 5 units with meals   A1C 11.6%  Noted that Lantus was increased to 52 units q HS beginning tonight and that Novolog meal coverage 5 units tid with meals (hold if patient eats less than 50%) was ordered.  Please give Lantus and Novolog correction insulin (SSI) this morning before surgery.   Gentry Fitz, RN, BA, MHA, CDE Diabetes Coordinator Inpatient Diabetes Program  [phone] (Team Pager) [phone] ([place]) 01/24/2015 9:01 AM

## 2015-01-24 NOTE — Op Note (Signed)
Preoperative diagnosis: Groin Abscess Postoperative diagnosis: Groin/thigh Abscess x 4 Procedure performed: Incision and drainage perineal abscess Surgeon: Zadrian Mccauley Anesthesia: General LMA EBL: 10 ml Specimen: None  Indication for procedure: Tanya Graves is a pleasant 49 yo diabetic female who presents with 3 days of hyperglycemia, right groin swelling and fluctuance and 3 smaller nodules on right labia and medial left thigh.  She was brought to the OR for incision and drainage of perineal abscess.  Details of procedure:  Informed consent was obtained.  Tanya Graves was brought to the operating room suite and laid supine on the OR table.  He was induced, LMA was placed and general anesthesia was administered.  Her legs were then placed frog leg and her groins and thighs were prepped and draped.  Timeout was performed identifying patient's name, operative site and procedure to be performed.  He was noted to have an approx 4 x 4 x 2 cm area in her right groin.  The wound was incised at the anterior and posterior aspects of the cavity.  The cavity was then probed with a hemostat to break up any septations.  The cavity was irrigated and a penrose was placed through the cavity and sutured into place with 3-0 nylon sutures.  It was then packed with 1/2 inch iodoform gauze.  The remaining three abscesses were incised and drained but not packed due to their sizes.  A sterile dressing was then placed over the wounds.  The patient was then awakened, LMA was removed and he was transferred to PACU.  There were no immediate complications.  Needle, sponge and instrument count was correct at the end of the procedure.

## 2015-01-24 NOTE — Progress Notes (Signed)
Patient just left for OR via her bed. Transported by the OR orderly

## 2015-01-24 NOTE — Anesthesia Preprocedure Evaluation (Addendum)
Anesthesia Evaluation  Patient identified by MRN, date of birth, ID band Patient awake    Reviewed: Allergy & Precautions, NPO status , Patient's Chart, lab work & pertinent test results  History of Anesthesia Complications Negative for: history of anesthetic complications  Airway Mallampati: I  TM Distance: >3 FB Neck ROM: Full    Dental  (+) Teeth Intact, Implants   Pulmonary Current Smoker (< 1/2 ppd),          Cardiovascular     Neuro/Psych    GI/Hepatic   Endo/Other  diabetes, Type 2, Insulin Dependent, Oral Hypoglycemic AgentsHypothyroidism   Renal/GU      Musculoskeletal   Abdominal   Peds  Hematology   Anesthesia Other Findings   Reproductive/Obstetrics                           Anesthesia Physical Anesthesia Plan  ASA: II  Anesthesia Plan: General   Post-op Pain Management: MAC Combined w/ Regional for Post-op pain   Induction: Intravenous  Airway Management Planned: LMA  Additional Equipment:   Intra-op Plan:   Post-operative Plan:   Informed Consent: I have reviewed the patients History and Physical, chart, labs and discussed the procedure including the risks, benefits and alternatives for the proposed anesthesia with the patient or authorized representative who has indicated his/her understanding and acceptance.     Plan Discussed with:   Anesthesia Plan Comments:        Anesthesia Quick Evaluation

## 2015-01-25 LAB — CBC
HCT: 35.6 % (ref 35.0–47.0)
Hemoglobin: 12.2 g/dL (ref 12.0–16.0)
MCH: 32.8 pg (ref 26.0–34.0)
MCHC: 34.3 g/dL (ref 32.0–36.0)
MCV: 95.6 fL (ref 80.0–100.0)
Platelets: 206 10*3/uL (ref 150–440)
RBC: 3.73 MIL/uL — ABNORMAL LOW (ref 3.80–5.20)
RDW: 13 % (ref 11.5–14.5)
WBC: 6.6 10*3/uL (ref 3.6–11.0)

## 2015-01-25 LAB — GLUCOSE, CAPILLARY
Glucose-Capillary: 182 mg/dL — ABNORMAL HIGH (ref 65–99)
Glucose-Capillary: 88 mg/dL (ref 65–99)
Glucose-Capillary: 110 mg/dL — ABNORMAL HIGH (ref 65–99)

## 2015-01-25 LAB — BASIC METABOLIC PANEL
Anion gap: 2 — ABNORMAL LOW (ref 5–15)
BUN: 10 mg/dL (ref 6–20)
CO2: 24 mmol/L (ref 22–32)
Calcium: 7.8 mg/dL — ABNORMAL LOW (ref 8.9–10.3)
Chloride: 112 mmol/L — ABNORMAL HIGH (ref 101–111)
Creatinine, Ser: 0.57 mg/dL (ref 0.44–1.00)
GFR calc Af Amer: >60 mL/min (ref >60)
GFR calc non Af Amer: >60 mL/min (ref >60)
Glucose, Bld: 105 mg/dL — ABNORMAL HIGH (ref 65–99)
Potassium: 3.6 mmol/L (ref 3.5–5.1)
Sodium: 138 mmol/L (ref 135–145)

## 2015-01-25 MED ORDER — INFLUENZA VAC SPLIT QUAD 0.5 ML IM SUSY
0.5000 mL | PREFILLED_SYRINGE | Freq: Once | INTRAMUSCULAR | Status: AC
  Administered 2015-01-25: 0.5 mL via INTRAMUSCULAR
  Filled 2015-01-25: qty 0.5

## 2015-01-25 MED ORDER — CLINDAMYCIN HCL 300 MG PO CAPS: 300.0000 mg | ORAL_CAPSULE | Freq: Three times a day (TID) | ORAL | Status: DC

## 2015-01-25 MED ORDER — INFLUENZA VAC SPLIT QUAD 0.5 ML IM SUSY: 0.5000 mL | PREFILLED_SYRINGE | INTRAMUSCULAR | Status: DC

## 2015-01-25 NOTE — Progress Notes (Signed)
Discharge instructions given per MD order. Pt's valuables taken home at time of discharge. Patient discharge via wheelchair.

## 2015-01-25 NOTE — Progress Notes (Signed)
[place] at [place] was admitted to the Hospital on 01/22/2015 and Discharged  01/25/2015 and should be excused from work/school   for 7days starting 01/22/2015 , may return to work/school without any restrictions.  Call Bettey Costa MD with questions.  Heily Carlucci M.D on 01/25/2015,at 11:45 AM  [place] [facility]  [phone]

## 2015-01-25 NOTE — Progress Notes (Signed)
Spoke with Dr.Modi regarding antibiotic Rx-MD to call in antibiotic for patient to pick up for clindamycin. Patient called and notified of antibiotic order called in by MD.

## 2015-01-25 NOTE — Discharge Instructions (Signed)
Patient to go home with penrose drain per MD order.Dressing supplies- abd pad/gloves given to patient.

## 2015-01-25 NOTE — Discharge Summary (Signed)
[facility] [place] NAME: Tanya Graves    MR#:  [id number]  DATE OF BIRTH:  12/22/1965  DATE OF ADMISSION:  01/22/2015 ADMITTING PHYSICIAN: Lytle Butte, MD  DATE OF DISCHARGE: 01/25/2015 PRIMARY CARE PHYSICIAN: No primary care provider on file.    ADMISSION DIAGNOSIS:  Hyperglycemia [R73.9] Cellulitis of right thigh [L03.115] Abscess of right thigh [L02.415]  DISCHARGE DIAGNOSIS:  Active Problems:   Sepsis affecting skin   Poorly controlled type 2 diabetes mellitus   Abscess of right thigh   Cellulitis of right thigh   Hyperglycemia   SECONDARY DIAGNOSIS:   Past Medical History  Diagnosis Date  . Diabetes mellitus without complication   . Thyroid disease     HOSPITAL COURSE:  December a very pleasant 49 year old female with a history of diabetes who presented with uncontrolled diabetes and coronary abscess. For further details please refer the H&P.  1. Sepsis: Patient met criteria by leukocytosis and tachycardia on arrival. Sepsis has resolved The sources due to these boils/groin abscess.. Patient was on broad-spectrum antibiotics including vancomycin.. Blood culture negative to date date. Patient underwent I&D on 01/24/2015. Culture from this is pending. Patient will follow-up with Dr. Rexene Edison. Patient will be discharged on clindamycin.   2. Type 2 diabetes: Insulin requiring. I suspect her blood sugars were elevated due to her acute infection. Blood sugars have improved. Patient should continue on her outpatient diabetic regimen.   3. Hypothyroidism: Continue Synthroid    DISCHARGE CONDITIONS AND DIET:  Patient's being discharged home in stable condition on a diabetic diet  CONSULTS OBTAINED:  Treatment Team:  Lytle Butte, MD  DRUG ALLERGIES:  No Known Allergies  DISCHARGE MEDICATIONS:   Current Discharge Medication List    CONTINUE these medications which have NOT CHANGED   Details  Insulin  Glargine (TOUJEO SOLOSTAR [place]) Inject 45 Units into the skin every morning.    insulin lispro (HUMALOG) 100 UNIT/ML injection Inject 4-11 Units into the skin 3 (three) times daily before meals. Sliding scale per blood sugar readings. BS100-150=4, 151-200=5, 201-250=7, 251-300=8, 301-350=9, over 350=11    levothyroxine (SYNTHROID, LEVOTHROID) 200 MCG tablet Take 200 mcg by mouth daily before breakfast.    metFORMIN (GLUCOPHAGE) 500 MG tablet Take 500 mg by mouth 2 (two) times daily with a meal.              Today   CHIEF COMPLAINT:  Patient is doing well this morning. Patient would like to go home.   VITAL SIGNS:  Blood pressure 140/95, pulse 77, temperature 98.1 F (36.7 C), temperature source Oral, resp. rate 18, height 5' 8" (1.727 m), weight 122.925 kg (271 lb), last menstrual period 01/16/2015, SpO2 98 %.   REVIEW OF SYSTEMS:  Review of Systems  Constitutional: Negative for fever, chills and malaise/fatigue.  HENT: Negative for sore throat.   Eyes: Negative for blurred vision.  Respiratory: Negative for cough, hemoptysis, shortness of breath and wheezing.   Cardiovascular: Negative for chest pain, palpitations and leg swelling.  Gastrointestinal: Negative for nausea, vomiting, abdominal pain, diarrhea and blood in stool.  Genitourinary: Negative for dysuria.  Musculoskeletal: Negative for back pain.  Skin:       Groin abscess much improved  Neurological: Negative for dizziness, tremors and headaches.  Endo/Heme/Allergies: Does not bruise/bleed easily.     PHYSICAL EXAMINATION:  GENERAL:  49 y.o.-year-old patient lying in the bed with no acute distress.  NECK:  Supple, no jugular venous distention.  No thyroid enlargement, no tenderness.  LUNGS: Normal breath sounds bilaterally, no wheezing, rales,rhonchi  No use of accessory muscles of respiration.  CARDIOVASCULAR: S1, S2 normal. No murmurs, rubs, or gallops.  ABDOMEN: Soft, non-tender, non-distended. Bowel sounds  present. No organomegaly or mass.  EXTREMITIES: No pedal edema, cyanosis, or clubbing.  PSYCHIATRIC: The patient is alert and oriented x 3.  SKIN: Abscess is packed.   DATA REVIEW:   CBC  Recent Labs Lab 01/25/15 0549  WBC 6.6  HGB 12.2  HCT 35.6  PLT 206    Chemistries   Recent Labs Lab 01/25/15 0549  NA 138  K 3.6  CL 112*  CO2 24  GLUCOSE 105*  BUN 10  CREATININE 0.57  CALCIUM 7.8*    Cardiac Enzymes No results for input(s): TROPONINI in the last 168 hours.  Microbiology Results  _0 @  RADIOLOGY:  No results found.    Management plans discussed with the patient and she is in agreement. Stable for discharge home   Patient should follow up with Dr.Lundquest 1 week.  CODE STATUS:     Code Status Orders        Start     Ordered   01/22/15 2257  Full code   Continuous     01/22/15 2257      TOTAL TIME TAKING CARE OF THIS PATIENT: 35 minutes.    Ariell Gunnels M.D on 01/25/2015 at 11:40 AM  Between 7am to 6pm - Pager - [phone] After 6pm go to [email or web address] - password [email or web address] [facility]  [facility]  Office  [phone]  CC: Primary care physician; No primary care provider on file.

## 2015-01-25 NOTE — Progress Notes (Signed)
Surgery progress note  S: Doing well.  Pain improved O:Blood pressure 140/95, pulse 77, temperature 98.1 F (36.7 C), temperature source Oral, resp. rate 18, height 5\' 8"  (1.727 m), weight 271 lb (122.925 kg), last menstrual period 01/16/2015, SpO2 98 %. GEN: NAD/A&Ox3 Groin: Incision with less erythema/induration, penrose in place  A/P 49 yo s/p I and D of groin abscess, doing well - okay for d/c, f/u in 1 week

## 2015-01-27 LAB — CULTURE, BLOOD (ROUTINE X 2)
Culture: NO GROWTH
Culture: NO GROWTH

## 2015-01-27 LAB — WOUND CULTURE

## 2015-01-28 LAB — GLUCOSE, CAPILLARY: Glucose-Capillary: 322 mg/dL — ABNORMAL HIGH (ref 65–99)

## 2015-01-31 ENCOUNTER — Telehealth: Payer: Self-pay | Admitting: Surgery

## 2015-01-31 NOTE — Telephone Encounter (Signed)
Patient ripped stitches out but drain tube is still in place. Has appointment Thursday with Dr. Rexene Edison. Little bleeding. Please call

## 2015-01-31 NOTE — Telephone Encounter (Signed)
Returned patient call.  Patient reports that the suture holding her penrose drain untied and the drain is now moving . Patient does not report fever, pain, or excessive drainage coming from the site. Informed patient to tape one end of the drain to keep it from sliding, and not to become concerned if the drain come out. Patient directed to seek medical attention if she develops a fever, severe pain, or drainage. Patient confirmed understanding of direction and information.

## 2015-02-01 LAB — ANAEROBIC CULTURE

## 2015-02-02 ENCOUNTER — Encounter: Payer: Self-pay | Admitting: Surgery

## 2015-02-02 ENCOUNTER — Ambulatory Visit (INDEPENDENT_AMBULATORY_CARE_PROVIDER_SITE_OTHER): Payer: 59 | Admitting: Surgery

## 2015-02-02 VITALS — BP 117/89 | HR 90 | Temp 98.1°F | Ht 68.0 in | Wt 274.6 lb

## 2015-02-02 DIAGNOSIS — Z09 Encounter for follow-up examination after completed treatment for conditions other than malignant neoplasm: Secondary | ICD-10-CM

## 2015-02-02 NOTE — Progress Notes (Signed)
Surgery Clinic Note  S: No acute issues.  Drain fell out O:Blood pressure 117/89, pulse 90, temperature 98.1 F (36.7 C), temperature source Oral, height 5\' 8"  (1.727 m), weight 274 lb 9.6 oz (124.558 kg), last menstrual period 01/16/2015. GEN: NAD/A&Ox3 GROIN: Mild induration, incisions c/d/i, no pain, no erythema  A/P 49 yo s/p I and D of right groin abscess, resolving - cont abx - no acute issues

## 2015-02-02 NOTE — Patient Instructions (Signed)
Please use chlorahexadine once a week all over your body. Just use in place of regular body wash. This should help decrease risk of reoccuring abscesses.  No need for follow-up in office unless you notice increased redness, pain, or fever. In this case, call our office and we will get you in immediately. If after hours, go to Emergency Department for care.

## 2015-02-13 ENCOUNTER — Telehealth: Payer: Self-pay | Admitting: Surgery

## 2015-02-13 NOTE — Telephone Encounter (Signed)
Returned patient call. Patient reports bloody drainage coming from her I&D location when pressure is applied. Appointment was made in the [facility] office on 02/14/15 at 2:00 to see Dr Rexene Edison. Patient confirmed appointment.

## 2015-02-13 NOTE — Telephone Encounter (Signed)
Patient requesting to speak with a nurse. Patient had I&D abscess/ Groin Abscess on 8/30 with Dr Rexene Edison - the incision is draining - clear and bloody. Concerned about infection. Please call and advise.

## 2015-02-14 ENCOUNTER — Encounter: Payer: Self-pay | Admitting: Surgery

## 2015-02-14 ENCOUNTER — Ambulatory Visit (INDEPENDENT_AMBULATORY_CARE_PROVIDER_SITE_OTHER): Payer: 59 | Admitting: Surgery

## 2015-02-14 VITALS — BP 121/85 | HR 93 | Temp 97.8°F | Ht 68.0 in | Wt 262.0 lb

## 2015-02-14 DIAGNOSIS — Z09 Encounter for follow-up examination after completed treatment for conditions other than malignant neoplasm: Secondary | ICD-10-CM

## 2015-02-14 NOTE — Progress Notes (Signed)
Surgery Progress Note  S:  Concerned about drainage.  Did have some mild swelling which allowed expression of clear/bloody fluid.  Non tender, no redness/induration. Blood sugars have been mildly elevated. O:Blood pressure 121/85, pulse 93, temperature 97.8 F (36.6 C), temperature source Oral, height 5\' 8"  (1.727 m), weight 262 lb (118.842 kg), last menstrual period 01/16/2015. GEN: NAD/A&Ox3 GROIN: nontender, no erythema/induration, purulence  A/P 49 yo s/p I and D of right groin abscess, doing well - no acute surgical issues, no need for abx - f/u prn

## 2015-02-14 NOTE — Patient Instructions (Signed)
Please call our office with any questions or concerns.  If you notice redness, a hardened area at the incision site, or a fever >100.5 - Call immediately so that we can work you in.  Continue to keep this area as clean and dry as possible, the incision looks great today!

## 2015-04-19 ENCOUNTER — Other Ambulatory Visit
Admission: RE | Admit: 2015-04-19 | Discharge: 2015-04-19 | Disposition: A | Discharge status: Home / Self Care | Payer: 59 | Source: Ambulatory Visit | Attending: Internal Medicine | Admitting: Internal Medicine

## 2015-04-19 DIAGNOSIS — E785 Hyperlipidemia, unspecified: Secondary | ICD-10-CM | POA: Insufficient documentation

## 2015-04-19 LAB — BASIC METABOLIC PANEL
Anion gap: 6 (ref 5–15)
BUN: 12 mg/dL (ref 6–20)
CO2: 27 mmol/L (ref 22–32)
Calcium: 9.2 mg/dL (ref 8.9–10.3)
Chloride: 108 mmol/L (ref 101–111)
Creatinine, Ser: 0.66 mg/dL (ref 0.44–1.00)
GFR calc Af Amer: >60 mL/min (ref >60)
GFR calc non Af Amer: >60 mL/min (ref >60)
Glucose, Bld: 124 mg/dL — ABNORMAL HIGH (ref 65–99)
Potassium: 4.4 mmol/L (ref 3.5–5.1)
Sodium: 141 mmol/L (ref 135–145)

## 2015-04-26 ENCOUNTER — Telehealth: Payer: Self-pay | Admitting: Surgery

## 2015-04-26 NOTE — Telephone Encounter (Signed)
Returned patent call. Patient needs information about her procedure sent to her supplemental insurance company for claim filing purposes. Informed patient that she will need to bring the required claim forms from her insurance company and we will fill in our portion of the forms. Patient confirmed understanding of information.

## 2015-04-26 NOTE — Telephone Encounter (Signed)
Patient asked for a letter with letter head for her work, Please call patient to get the details for the note

## 2015-06-22 ENCOUNTER — Telehealth: Payer: Self-pay | Admitting: Surgery

## 2015-06-22 NOTE — Telephone Encounter (Signed)
Patient said Dr. Rexene Edison did a surgery for her 9/16. I wasn't sure what she was talking about but she said something popped up. She would like to talk to a nurse.

## 2015-06-22 NOTE — Telephone Encounter (Signed)
Tanya Graves, just fyi patient called in and I gave her the information that you instructed to give her in the message below. Patient is going to call her GYN doctor now to make an appointment.

## 2015-06-22 NOTE — Telephone Encounter (Signed)
Has another abscess that has come up on the left labia. Noticed this yesterday, has doubled in size since then. States that this is painful. Pt denies shaving. Was on Clindamycin with last abscess and she states that this worked well.  Explained that I would need to speak with Dr. Adonis Huguenin regarding this today and that I would get back to her after this.

## 2015-06-22 NOTE — Telephone Encounter (Signed)
Returned phone call to patient at this time. No answer. Left voicemail for return phone call.   If patient returns call, I have spoken with Dr. Adonis Huguenin and he would like for patient to call GYN physician and schedule and appointment for this abscess as this is in the gynecologic area. If she cannot be seen by her gynecologist and begins to develop a fever, severe pain, or nausea and vomiting she will need to go to the Emergency Room.

## 2015-06-23 ENCOUNTER — Telehealth: Payer: Self-pay

## 2015-06-23 NOTE — Telephone Encounter (Signed)
Patient called back in at this time. She states that she cannot get a hold of [facility] at this time to make an appointment. She has called 4 times today and has sent a message through the portal. I called over to [facility] at this time and spoke with Roswell Miners (Nurse) and explained that patient has a labial abscess and needs seen today, she will have a receptionist call the patient right away to get her on the schedule.  Returned call to patient and told her to be expecting a call from [place]. She thanked me for working on this for her.

## 2015-07-13 LAB — HM PAP SMEAR: HM Pap smear: NEGATIVE

## 2015-07-13 LAB — HM MAMMOGRAPHY: HM Mammogram: NORMAL (ref 0–4)

## 2016-03-14 ENCOUNTER — Other Ambulatory Visit: Payer: Self-pay | Admitting: Internal Medicine

## 2016-03-14 DIAGNOSIS — R51 Headache: Principal | ICD-10-CM

## 2016-03-14 DIAGNOSIS — R519 Headache, unspecified: Secondary | ICD-10-CM

## 2016-03-15 ENCOUNTER — Ambulatory Visit
Admission: RE | Admit: 2016-03-15 | Discharge: 2016-03-15 | Disposition: A | Discharge status: Home / Self Care | Payer: 59 | Source: Ambulatory Visit | Attending: Internal Medicine | Admitting: Internal Medicine

## 2016-03-15 DIAGNOSIS — R519 Headache, unspecified: Secondary | ICD-10-CM

## 2016-03-15 DIAGNOSIS — R51 Headache: Secondary | ICD-10-CM | POA: Insufficient documentation

## 2016-05-21 ENCOUNTER — Other Ambulatory Visit: Payer: Self-pay | Admitting: Specialist

## 2016-06-06 ENCOUNTER — Other Ambulatory Visit: Payer: 59

## 2016-06-07 LAB — HEMOGLOBIN A1C: Hemoglobin A1C: 12.9

## 2016-06-07 LAB — LIPID PANEL
Cholesterol: 230 — AB (ref 0–200)
HDL: 63 (ref 35–70)
LDL Cholesterol: 141
LDl/HDL Ratio: 2.2
Triglycerides: 129 (ref 40–160)

## 2016-06-07 LAB — TSH: TSH: 6.98 — AB (ref 0.41–5.90)

## 2016-06-12 ENCOUNTER — Ambulatory Visit: Admit: 2016-06-12 | Payer: 59 | Admitting: Specialist

## 2016-06-12 SURGERY — REPAIR, TENDON, ACHILLES
Anesthesia: Choice | Laterality: Left

## 2016-06-18 ENCOUNTER — Other Ambulatory Visit: Payer: Self-pay | Admitting: Specialist

## 2016-07-08 ENCOUNTER — Encounter
Admission: RE | Admit: 2016-07-08 | Discharge: 2016-07-08 | Disposition: A | Discharge status: Home / Self Care | Payer: 59 | Source: Ambulatory Visit | Attending: Specialist | Admitting: Specialist

## 2016-07-08 DIAGNOSIS — Z01812 Encounter for preprocedural laboratory examination: Secondary | ICD-10-CM | POA: Diagnosis present

## 2016-07-08 HISTORY — DX: Unspecified osteoarthritis, unspecified site: M19.90

## 2016-07-08 LAB — BASIC METABOLIC PANEL
Anion gap: 7 (ref 5–15)
BUN: 16 mg/dL (ref 6–20)
CO2: 29 mmol/L (ref 22–32)
Calcium: 9.4 mg/dL (ref 8.9–10.3)
Chloride: 101 mmol/L (ref 101–111)
Creatinine, Ser: 0.7 mg/dL (ref 0.44–1.00)
GFR calc Af Amer: >60 mL/min (ref >60)
GFR calc non Af Amer: >60 mL/min (ref >60)
Glucose, Bld: 275 mg/dL — ABNORMAL HIGH (ref 65–99)
Potassium: 4.9 mmol/L (ref 3.5–5.1)
Sodium: 137 mmol/L (ref 135–145)

## 2016-07-08 NOTE — Patient Instructions (Addendum)
  Your procedure is scheduled on: July 17, 2016 (Wednesday) Report to Same Day Surgery 2nd floor medical mall [facility] Entrance-take elevator on left to 2nd floor.  Check in with surgery information desk.) To find out your arrival time please call [phone] between 1PM - 3PM on July 16, 2016 (Tuesday)  Remember: Instructions that are not followed completely may result in serious medical risk, up to and including death, or upon the discretion of your surgeon and anesthesiologist your surgery may need to be rescheduled.    _x___ 1. Do not eat food or drink liquids after midnight. No gum chewing or hard candies.     __x__ 2. No Alcohol for 24 hours before or after surgery.   __x__3. No Smoking for 24 prior to surgery.   ____  4. Bring all medications with you on the day of surgery if instructed.    __x__ 5. Notify your doctor if there is any change in your medical condition     (cold, fever, infections).     Do not wear jewelry, make-up, hairpins, clips or nail polish.  Do not wear lotions, powders, or perfumes. You may wear deodorant.  Do not shave 48 hours prior to surgery. Men may shave face and neck.  Do not bring valuables to the hospital.    [facility] is not responsible for any belongings or valuables.               Contacts, dentures or bridgework may not be worn into surgery.  Leave your suitcase in the car. After surgery it may be brought to your room.  For patients admitted to the hospital, discharge time is determined by your treatment team.   Patients discharged the day of surgery will not be allowed to drive home.  You will need someone to drive you home and stay with you the night of your procedure.    Please read over the following fact sheets that you were given:   [facility] Preparing for Surgery and or MRSA Information   _x___ Take these medicines the morning of surgery with A SIP OF WATER:    1.  Levothyroxine  2.  3.  4.  5.  6.  ____Fleets enema or Magnesium Citrate as directed.   _x___ Use CHG Soap or sage wipes as directed on instruction sheet   _x___ Use inhalers on the day of surgery and bring to hospital day of surgery (USE VENTOLIN INHALER THE MORNING OF SURGERY AND [place])  _X___ Stop metformin 2 days prior to surgery ([place])   _x__ Take 1/2 of usual insulin dose the night before surgery and none on the morning of surgery (NO INSULIN THE MORNING OF SURGERY)           ____ Stop Aspirin, Coumadin, Pllavix ,Eliquis, Effient, or Pradaxa  x__ Stop Anti-inflammatories such as Advil, Aleve, Ibuprofen, Motrin, Naproxen,          Naprosyn, Goodies powders or aspirin products. Ok to take Tylenol.   ____ Stop supplements until after surgery.    ____ Bring C-Pap to the hospital.

## 2016-07-17 ENCOUNTER — Ambulatory Visit: Payer: 59 | Admitting: Anesthesiology

## 2016-07-17 ENCOUNTER — Encounter
Admission: RE | Disposition: A | Discharge status: Home / Self Care | Payer: Self-pay | Source: Ambulatory Visit | Attending: Specialist

## 2016-07-17 ENCOUNTER — Ambulatory Visit
Admission: RE | Admit: 2016-07-17 | Discharge: 2016-07-17 | Disposition: A | Discharge status: Home / Self Care | Payer: 59 | Source: Ambulatory Visit | Attending: Specialist | Admitting: Specialist

## 2016-07-17 ENCOUNTER — Encounter: Payer: Self-pay | Admitting: *Deleted

## 2016-07-17 DIAGNOSIS — M7662 Achilles tendinitis, left leg: Secondary | ICD-10-CM | POA: Insufficient documentation

## 2016-07-17 DIAGNOSIS — E119 Type 2 diabetes mellitus without complications: Secondary | ICD-10-CM | POA: Insufficient documentation

## 2016-07-17 DIAGNOSIS — Z720 Tobacco use: Secondary | ICD-10-CM | POA: Insufficient documentation

## 2016-07-17 DIAGNOSIS — Z888 Allergy status to other drugs, medicaments and biological substances status: Secondary | ICD-10-CM | POA: Diagnosis not present

## 2016-07-17 DIAGNOSIS — Z794 Long term (current) use of insulin: Secondary | ICD-10-CM | POA: Diagnosis not present

## 2016-07-17 DIAGNOSIS — M9262 Juvenile osteochondrosis of tarsus, left ankle: Secondary | ICD-10-CM | POA: Insufficient documentation

## 2016-07-17 HISTORY — PX: CALCANEAL OSTEOTOMY: SHX1281

## 2016-07-17 LAB — GLUCOSE, CAPILLARY
Glucose-Capillary: 244 mg/dL — ABNORMAL HIGH (ref 65–99)
Glucose-Capillary: 204 mg/dL — ABNORMAL HIGH (ref 65–99)

## 2016-07-17 SURGERY — OSTEOTOMY, CALCANEUS
Anesthesia: General | Laterality: Left | Wound class: Clean

## 2016-07-17 MED ORDER — ONDANSETRON HCL 4 MG/2ML IJ SOLN
INTRAMUSCULAR | Status: AC
  Filled 2016-07-17: qty 2

## 2016-07-17 MED ORDER — DEXAMETHASONE SODIUM PHOSPHATE 10 MG/ML IJ SOLN
INTRAMUSCULAR | Status: AC
  Filled 2016-07-17: qty 1

## 2016-07-17 MED ORDER — MELOXICAM 15 MG PO TABS: 15.0000 mg | ORAL_TABLET | Freq: Every day | ORAL | 3 refills | Status: DC

## 2016-07-17 MED ORDER — INSULIN ASPART 100 UNIT/ML ~~LOC~~ SOLN
SUBCUTANEOUS | Status: AC
  Administered 2016-07-17: 6 [IU] via SUBCUTANEOUS
  Filled 2016-07-17: qty 1

## 2016-07-17 MED ORDER — ACETAMINOPHEN 10 MG/ML IV SOLN
INTRAVENOUS | Status: AC
  Filled 2016-07-17: qty 100

## 2016-07-17 MED ORDER — GABAPENTIN 400 MG PO CAPS
ORAL_CAPSULE | ORAL | Status: AC
  Administered 2016-07-17: 400 mg via ORAL
  Filled 2016-07-17: qty 1

## 2016-07-17 MED ORDER — EPHEDRINE SULFATE 50 MG/ML IJ SOLN
INTRAMUSCULAR | Status: AC
  Filled 2016-07-17: qty 1

## 2016-07-17 MED ORDER — GABAPENTIN 400 MG PO CAPS
400.0000 mg | ORAL_CAPSULE | Freq: Once | ORAL | Status: AC
  Administered 2016-07-17: 400 mg via ORAL

## 2016-07-17 MED ORDER — ROCURONIUM BROMIDE 50 MG/5ML IV SOSY
PREFILLED_SYRINGE | INTRAVENOUS | Status: AC
  Filled 2016-07-17: qty 5

## 2016-07-17 MED ORDER — BUPIVACAINE HCL (PF) 0.5 % IJ SOLN
INTRAMUSCULAR | Status: AC
  Filled 2016-07-17: qty 30

## 2016-07-17 MED ORDER — INSULIN ASPART 100 UNIT/ML ~~LOC~~ SOLN
SUBCUTANEOUS | Status: DC
Start: 2016-07-17 — End: 2016-07-17
  Filled 2016-07-17: qty 6

## 2016-07-17 MED ORDER — FENTANYL CITRATE (PF) 100 MCG/2ML IJ SOLN
INTRAMUSCULAR | Status: DC | PRN
  Administered 2016-07-17: 100 ug via INTRAVENOUS
  Administered 2016-07-17: 150 ug via INTRAVENOUS

## 2016-07-17 MED ORDER — CLINDAMYCIN PHOSPHATE 900 MG/50ML IV SOLN
INTRAVENOUS | Status: AC
  Filled 2016-07-17: qty 50

## 2016-07-17 MED ORDER — SODIUM CHLORIDE 0.9 % IV SOLN
INTRAVENOUS | Status: DC
  Administered 2016-07-17: 07:00:00 via INTRAVENOUS

## 2016-07-17 MED ORDER — BUPIVACAINE HCL (PF) 0.5 % IJ SOLN
INTRAMUSCULAR | Status: DC | PRN
  Administered 2016-07-17: 30 mL

## 2016-07-17 MED ORDER — NEOMYCIN-POLYMYXIN B GU 40-200000 IR SOLN
Status: DC | PRN
  Administered 2016-07-17: 4 mL

## 2016-07-17 MED ORDER — HYDROCODONE-ACETAMINOPHEN 7.5-325 MG PO TABS
1.0000 | ORAL_TABLET | Freq: Four times a day (QID) | ORAL | Status: DC | PRN
  Administered 2016-07-17: 1 via ORAL
  Filled 2016-07-17: qty 1

## 2016-07-17 MED ORDER — MELOXICAM 7.5 MG PO TABS
ORAL_TABLET | ORAL | Status: AC
  Administered 2016-07-17: 15 mg via ORAL
  Filled 2016-07-17: qty 2

## 2016-07-17 MED ORDER — GLYCOPYRROLATE 0.2 MG/ML IJ SOLN
INTRAMUSCULAR | Status: DC | PRN
  Administered 2016-07-17: .4 mg via INTRAVENOUS

## 2016-07-17 MED ORDER — PHENYLEPHRINE HCL 10 MG/ML IJ SOLN
INTRAMUSCULAR | Status: DC | PRN
  Administered 2016-07-17: 200 ug via INTRAVENOUS

## 2016-07-17 MED ORDER — HYDROCODONE-ACETAMINOPHEN 7.5-325 MG PO TABS
ORAL_TABLET | ORAL | Status: AC
  Filled 2016-07-17: qty 1

## 2016-07-17 MED ORDER — CHLORHEXIDINE GLUCONATE CLOTH 2 % EX PADS: 6.0000 | MEDICATED_PAD | Freq: Once | CUTANEOUS | Status: DC

## 2016-07-17 MED ORDER — NEOMYCIN-POLYMYXIN B GU 40-200000 IR SOLN
Status: AC
  Filled 2016-07-17: qty 4

## 2016-07-17 MED ORDER — MELOXICAM 7.5 MG PO TABS: 15.0000 mg | ORAL_TABLET | Freq: Once | ORAL | Status: DC

## 2016-07-17 MED ORDER — FENTANYL CITRATE (PF) 100 MCG/2ML IJ SOLN
INTRAMUSCULAR | Status: AC
Start: 2016-07-17 — End: 2016-07-17
  Administered 2016-07-17: 25 ug via INTRAVENOUS
  Filled 2016-07-17: qty 2

## 2016-07-17 MED ORDER — ACETAMINOPHEN 10 MG/ML IV SOLN
INTRAVENOUS | Status: DC | PRN
  Administered 2016-07-17: 1000 mg via INTRAVENOUS

## 2016-07-17 MED ORDER — PROPOFOL 10 MG/ML IV BOLUS
INTRAVENOUS | Status: DC | PRN
  Administered 2016-07-17: 200 mg via INTRAVENOUS

## 2016-07-17 MED ORDER — INSULIN ASPART 100 UNIT/ML ~~LOC~~ SOLN
SUBCUTANEOUS | Status: AC
  Filled 2016-07-17: qty 1

## 2016-07-17 MED ORDER — CEFAZOLIN SODIUM-DEXTROSE 2-4 GM/100ML-% IV SOLN
INTRAVENOUS | Status: AC
  Filled 2016-07-17: qty 100

## 2016-07-17 MED ORDER — HYDROCODONE-ACETAMINOPHEN 7.5-325 MG PO TABS: 1.0000 | ORAL_TABLET | Freq: Four times a day (QID) | ORAL | 0 refills | Status: DC | PRN

## 2016-07-17 MED ORDER — ONDANSETRON HCL 4 MG/2ML IJ SOLN
INTRAMUSCULAR | Status: DC | PRN
  Administered 2016-07-17: 4 mg via INTRAVENOUS

## 2016-07-17 MED ORDER — GABAPENTIN 400 MG PO CAPS: 400.0000 mg | ORAL_CAPSULE | ORAL | Status: DC

## 2016-07-17 MED ORDER — FAMOTIDINE 20 MG PO TABS
20.0000 mg | ORAL_TABLET | Freq: Once | ORAL | Status: AC
  Administered 2016-07-17: 20 mg via ORAL

## 2016-07-17 MED ORDER — LIDOCAINE HCL (CARDIAC) 20 MG/ML IV SOLN
INTRAVENOUS | Status: DC | PRN
  Administered 2016-07-17: 100 mg via INTRAVENOUS

## 2016-07-17 MED ORDER — PHENYLEPHRINE HCL 10 MG/ML IJ SOLN
INTRAMUSCULAR | Status: DC | PRN
  Administered 2016-07-17: 30 ug/min via INTRAVENOUS

## 2016-07-17 MED ORDER — FENTANYL CITRATE (PF) 250 MCG/5ML IJ SOLN
INTRAMUSCULAR | Status: AC
  Filled 2016-07-17: qty 5

## 2016-07-17 MED ORDER — MELOXICAM 7.5 MG PO TABS
15.0000 mg | ORAL_TABLET | Freq: Once | ORAL | Status: AC
  Administered 2016-07-17: 15 mg via ORAL

## 2016-07-17 MED ORDER — PROPOFOL 10 MG/ML IV BOLUS
INTRAVENOUS | Status: AC
  Filled 2016-07-17: qty 20

## 2016-07-17 MED ORDER — GLYCOPYRROLATE 0.2 MG/ML IJ SOLN
INTRAMUSCULAR | Status: AC
  Filled 2016-07-17: qty 1

## 2016-07-17 MED ORDER — NEOSTIGMINE METHYLSULFATE 10 MG/10ML IV SOLN
INTRAVENOUS | Status: DC | PRN
  Administered 2016-07-17: 2.5 mg via INTRAVENOUS

## 2016-07-17 MED ORDER — FENTANYL CITRATE (PF) 100 MCG/2ML IJ SOLN
INTRAMUSCULAR | Status: AC
  Filled 2016-07-17: qty 2

## 2016-07-17 MED ORDER — ROCURONIUM BROMIDE 100 MG/10ML IV SOLN
INTRAVENOUS | Status: DC | PRN
  Administered 2016-07-17: 10 mg via INTRAVENOUS
  Administered 2016-07-17: 50 mg via INTRAVENOUS

## 2016-07-17 MED ORDER — DEXAMETHASONE SODIUM PHOSPHATE 4 MG/ML IJ SOLN
INTRAMUSCULAR | Status: DC | PRN
  Administered 2016-07-17: 5 mg via INTRAVENOUS

## 2016-07-17 MED ORDER — CEFAZOLIN SODIUM-DEXTROSE 2-4 GM/100ML-% IV SOLN
2.0000 g | INTRAVENOUS | Status: AC
  Administered 2016-07-17: 2 g via INTRAVENOUS

## 2016-07-17 MED ORDER — FENTANYL CITRATE (PF) 100 MCG/2ML IJ SOLN
25.0000 ug | INTRAMUSCULAR | Status: DC | PRN
  Administered 2016-07-17 (×5): 25 ug via INTRAVENOUS

## 2016-07-17 MED ORDER — ONDANSETRON HCL 4 MG/2ML IJ SOLN: 4.0000 mg | Freq: Once | INTRAMUSCULAR | Status: DC | PRN

## 2016-07-17 MED ORDER — CEFAZOLIN SODIUM-DEXTROSE 2-4 GM/100ML-% IV SOLN: 2.0000 g | INTRAVENOUS | Status: DC

## 2016-07-17 MED ORDER — CLINDAMYCIN PHOSPHATE 900 MG/50ML IV SOLN
900.0000 mg | INTRAVENOUS | Status: AC
  Administered 2016-07-17: 900 mg via INTRAVENOUS

## 2016-07-17 MED ORDER — INSULIN ASPART 100 UNIT/ML ~~LOC~~ SOLN
6.0000 [IU] | Freq: Once | SUBCUTANEOUS | Status: AC
  Administered 2016-07-17: 6 [IU] via SUBCUTANEOUS

## 2016-07-17 MED ORDER — FAMOTIDINE 20 MG PO TABS
ORAL_TABLET | ORAL | Status: AC
  Administered 2016-07-17: 20 mg via ORAL
  Filled 2016-07-17: qty 1

## 2016-07-17 MED ORDER — MIDAZOLAM HCL 2 MG/2ML IJ SOLN
INTRAMUSCULAR | Status: DC | PRN
  Administered 2016-07-17: 2 mg via INTRAVENOUS

## 2016-07-17 MED ORDER — MIDAZOLAM HCL 2 MG/2ML IJ SOLN
INTRAMUSCULAR | Status: AC
  Filled 2016-07-17: qty 2

## 2016-07-17 MED ORDER — LIDOCAINE HCL (PF) 2 % IJ SOLN
INTRAMUSCULAR | Status: AC
  Filled 2016-07-17: qty 2

## 2016-07-17 MED ORDER — GABAPENTIN 400 MG PO CAPS: 400.0000 mg | ORAL_CAPSULE | Freq: Two times a day (BID) | ORAL | 3 refills | Status: DC

## 2016-07-17 SURGICAL SUPPLY — 42 items
BLADE OSCILLATING/SAGITTAL (BLADE) ×2
BLADE SURG MINI STRL (BLADE) ×3 IMPLANT
BLADE SW THK.38XMED LNG THN (BLADE) ×1 IMPLANT
BNDG COHESIVE 4X5 TAN STRL (GAUZE/BANDAGES/DRESSINGS) ×3 IMPLANT
BNDG ESMARK 6X12 TAN STRL LF (GAUZE/BANDAGES/DRESSINGS) ×3 IMPLANT
BUR 4X45 EGG (BURR) ×3 IMPLANT
CANISTER SUCT 1200ML W/VALVE (MISCELLANEOUS) ×3 IMPLANT
CHLORAPREP W/TINT 26ML (MISCELLANEOUS) ×3 IMPLANT
CUFF TOURN 30 N/S (MISCELLANEOUS) ×3 IMPLANT
DRAIN PENROSE 1/4X12 LTX (DRAIN) IMPLANT
DRAPE FLUOR MINI C-ARM 54X84 (DRAPES) ×3 IMPLANT
ELECT REM PT RETURN 9FT ADLT (ELECTROSURGICAL) ×3
ELECTRODE REM PT RTRN 9FT ADLT (ELECTROSURGICAL) ×1 IMPLANT
GAUZE PETRO XEROFOAM 1X8 (MISCELLANEOUS) ×3 IMPLANT
GAUZE SPONGE 4X4 12PLY STRL (GAUZE/BANDAGES/DRESSINGS) ×3 IMPLANT
GLOVE SURG ORTHO 8.0 STRL STRW (GLOVE) ×3 IMPLANT
GOWN STRL REUS W/ TWL LRG LVL3 (GOWN DISPOSABLE) ×1 IMPLANT
GOWN STRL REUS W/TWL LRG LVL3 (GOWN DISPOSABLE) ×2
GOWN STRL REUS W/TWL LRG LVL4 (GOWN DISPOSABLE) ×3 IMPLANT
HANDLE YANKAUER SUCT BULB TIP (MISCELLANEOUS) ×3 IMPLANT
KIT RM TURNOVER STRD PROC AR (KITS) ×3 IMPLANT
LOOP RED MAXI  1X406MM (MISCELLANEOUS)
LOOP VESSEL MAXI 1X406 RED (MISCELLANEOUS) IMPLANT
LOOP VESSEL SUPERMAXI WHITE (MISCELLANEOUS) IMPLANT
NDL SAFETY 18GX1.5 (NEEDLE) ×3 IMPLANT
NEEDLE FILTER BLUNT 18X 1/2SAF (NEEDLE) ×2
NEEDLE FILTER BLUNT 18X1 1/2 (NEEDLE) ×1 IMPLANT
NS IRRIG 1000ML POUR BTL (IV SOLUTION) ×3 IMPLANT
PACK EXTREMITY ARMC (MISCELLANEOUS) ×3 IMPLANT
PADDING CAST 4IN STRL (MISCELLANEOUS) ×4
PADDING CAST BLEND 4X4 STRL (MISCELLANEOUS) ×2 IMPLANT
SPLINT CAST 1 STEP 4X30 (MISCELLANEOUS) IMPLANT
SPONGE LAP 18X18 5 PK (GAUZE/BANDAGES/DRESSINGS) ×3 IMPLANT
STAPLER SKIN PROX 35W (STAPLE) ×3 IMPLANT
STOCKINETTE BIAS CUT 6 980064 (GAUZE/BANDAGES/DRESSINGS) ×3 IMPLANT
STOCKINETTE IMPERVIOUS 9X36 MD (GAUZE/BANDAGES/DRESSINGS) ×3 IMPLANT
SUT BONE WAX W31G (SUTURE) ×3 IMPLANT
SUT VIC AB 2-0 CT2 27 (SUTURE) IMPLANT
SUT VIC AB 3-0 SH 27 (SUTURE) ×2
SUT VIC AB 3-0 SH 27X BRD (SUTURE) ×1 IMPLANT
SYR 30ML LL (SYRINGE) ×3 IMPLANT
SYRINGE 10CC LL (SYRINGE) ×3 IMPLANT

## 2016-07-17 NOTE — Op Note (Signed)
07/17/2016  9:14 AM  PATIENT:  Tanya Graves    PRE-OPERATIVE DIAGNOSIS:  M76.62 Achilles tendinitis, left leg  POST-OPERATIVE DIAGNOSIS:  Same  PROCEDURE:  CALCANEAL OSTEOTOMY LEFT  SURGEON:  Park Breed, MD  ANESTHESIA:   General  PREOPERATIVE INDICATIONS:  [name] is a  51 y.o. female with a diagnosis of M76.62 Achilles tendinitis, left leg who failed conservative measures and elected for surgical management.    The risks benefits and alternatives were discussed with the patient preoperatively including but not limited to the risks of infection, bleeding, nerve injury, cardiopulmonary complications, the need for revision surgery, among others, and the patient was willing to proceed.  EBL: None  TOURNIQUET TIME: 43  MIN  OPERATIVE IMPLANTS: None  OPERATIVE FINDINGS: Thickened bursa retrocalcaneal area  OPERATIVE PROCEDURE: The patient was brought to the operating room and underwent satisfactory general endotracheal anesthesia. The patient was then rolled into the prone position on the operating room table and padded appropriately.  The operative leg was prepped and draped sterile fashion.  Esmarch was applied and tourniquet inflated to 300 mmHg.  A posterior lateral incision was made parallel to the distal portion of the Achilles tendon.  Dissection was carried out bluntly through subcutaneous tissue.  The sural nerve was protected.  The retrocalcaneal  bursa and adjacent tissues were debrided to expose the dorsal aspect of the calcaneus posteriorly.  The interval between the Achilles tendon and the large Haglund deformity was exposed and retracted with a Crego retractor.  An oscillating saw was then used to remove the large bony prominence.   A rasp was used to smooth this off.  Fluoroscopy showed good removal of the offending bone.  The traction spur at the distal Achilles insertion point was left alone.  After thorough irrigation, bone wax was applied to the raw bony  surface.  The soft tissues were closed with 2-0 and 3-0 Vicryls and the skin was closed with staples.  Half percent Marcaine was placed in the soft tissues.  Dry sterile dressing with a posterior splint was applied with the ankle in some equinus.  Sponge and needle counts were correct.  The patient was then rolled into the supine position on a stretcher and awakened.  Return to the recovery room in good condition.  Park Breed, MD

## 2016-07-17 NOTE — Discharge Instructions (Addendum)
AMBULATORY SURGERY  °DISCHARGE INSTRUCTIONS ° ° °1) The drugs that you were given will stay in your system until tomorrow so for the next 24 hours you should not: ° °A) Drive an automobile °B) Make any legal decisions °C) Drink any alcoholic beverage ° ° °2) You may resume regular meals tomorrow.  Today it is better to start with liquids and gradually work up to solid foods. ° °You may eat anything you prefer, but it is better to start with liquids, then soup and crackers, and gradually work up to solid foods. ° ° °3) Please notify your doctor immediately if you have any unusual bleeding, trouble breathing, redness and pain at the surgery site, drainage, fever, or pain not relieved by medication. ° °Please contact your physician with any problems or Same Day Surgery at [phone], Monday through Friday 6 am to 4 pm, or [facility] [place] Main number at [phone]. °

## 2016-07-17 NOTE — Anesthesia Post-op Follow-up Note (Cosign Needed)
Anesthesia QCDR form completed.        

## 2016-07-17 NOTE — Anesthesia Procedure Notes (Addendum)
Procedure Name: Intubation Date/Time: 07/17/2016 7:51 AM Performed by: Rosaria Ferries, Caleb Prigmore Pre-anesthesia Checklist: Patient identified, Emergency Drugs available, Suction available and Patient being monitored Patient Re-evaluated:Patient Re-evaluated prior to inductionOxygen Delivery Method: Circle system utilized Preoxygenation: Pre-oxygenation with 100% oxygen Intubation Type: IV induction Laryngoscope Size: Miller and 3 Grade View: Grade I Tube size: 7.0 mm Number of attempts: 1 Placement Confirmation: ETT inserted through vocal cords under direct vision,  positive ETCO2 and breath sounds checked- equal and bilateral Secured at: 22 cm Tube secured with: Tape Dental Injury: Teeth and Oropharynx as per pre-operative assessment

## 2016-07-17 NOTE — Anesthesia Postprocedure Evaluation (Signed)
Anesthesia Post Note  Patient: Tanya Graves  Procedure(s) Performed: Procedure(s) (LRB): CALCANEAL OSTEOTOMY (Left)  Patient location during evaluation: PACU Anesthesia Type: General Level of consciousness: awake and alert Pain management: pain level controlled Vital Signs Assessment: post-procedure vital signs reviewed and stable Respiratory status: spontaneous breathing and respiratory function stable Cardiovascular status: stable Anesthetic complications: no     Last Vitals:  Vitals:   07/17/16 1002 07/17/16 1011  BP: 106/68 98/62  Pulse: 84 67  Resp: 14 18  Temp: 36.4 C 36.3 C    Last Pain:  Vitals:   07/17/16 1011  TempSrc: Temporal  PainSc: 4                  KEPHART,WILLIAM K

## 2016-07-17 NOTE — H&P (Signed)
THE PATIENT WAS SEEN PRIOR TO SURGERY TODAY.  HISTORY, ALLERGIES, HOME MEDICATIONS AND OPERATIVE PROCEDURE WERE REVIEWED. RISKS AND BENEFITS OF SURGERY DISCUSSED WITH PATIENT AGAIN.  NO CHANGES FROM INITIAL HISTORY AND PHYSICAL NOTED.    

## 2016-07-17 NOTE — Anesthesia Preprocedure Evaluation (Addendum)
Anesthesia Evaluation  Patient identified by MRN, date of birth, ID band Patient awake    Reviewed: Allergy & Precautions, NPO status , Patient's Chart, lab work & pertinent test results  History of Anesthesia Complications Negative for: history of anesthetic complications  Airway Mallampati: II       Dental   Pulmonary Current Smoker,           Cardiovascular negative cardio ROS       Neuro/Psych negative neurological ROS  negative psych ROS   GI/Hepatic negative GI ROS, Neg liver ROS,   Endo/Other  diabetes, Type 2, Oral Hypoglycemic Agents, Insulin Dependent  Renal/GU negative Renal ROS     Musculoskeletal negative musculoskeletal ROS (+)   Abdominal   Peds  Hematology negative hematology ROS (+)   Anesthesia Other Findings   Reproductive/Obstetrics                             Anesthesia Physical Anesthesia Plan  ASA: III  Anesthesia Plan: General   Post-op Pain Management:    Induction: Intravenous  Airway Management Planned: Oral ETT  Additional Equipment:   Intra-op Plan:   Post-operative Plan:   Informed Consent: I have reviewed the patients History and Physical, chart, labs and discussed the procedure including the risks, benefits and alternatives for the proposed anesthesia with the patient or authorized representative who has indicated his/her understanding and acceptance.     Plan Discussed with:   Anesthesia Plan Comments:        Anesthesia Quick Evaluation

## 2016-07-17 NOTE — Transfer of Care (Signed)
Immediate Anesthesia Transfer of Care Note  Patient: Tanya Graves  Procedure(s) Performed: Procedure(s): CALCANEAL OSTEOTOMY (Left)  Patient Location: PACU  Anesthesia Type:General  Level of Consciousness: awake, alert , oriented and patient cooperative  Airway & Oxygen Therapy: Patient Spontanous Breathing and Patient connected to nasal cannula oxygen  Post-op Assessment: Report given to RN and Post -op Vital signs reviewed and stable  Post vital signs: Reviewed and stable  Last Vitals:  Vitals:   07/17/16 0658  BP: 123/75  Pulse: 97  Resp: 20  Temp: 36.6 C    Last Pain:  Vitals:   07/17/16 0658  TempSrc: Oral  PainSc: 2       Patients Stated Pain Goal: 2 ([id number])  Complications: No apparent anesthesia complications

## 2016-07-18 ENCOUNTER — Encounter: Payer: Self-pay | Admitting: Specialist

## 2016-07-18 LAB — SURGICAL PATHOLOGY

## 2016-11-19 ENCOUNTER — Emergency Department
Admission: EM | Admit: 2016-11-19 | Discharge: 2016-11-19 | Disposition: A | Discharge status: Home / Self Care | Payer: 59 | Attending: Emergency Medicine | Admitting: Emergency Medicine

## 2016-11-19 ENCOUNTER — Encounter: Payer: Self-pay | Admitting: Emergency Medicine

## 2016-11-19 DIAGNOSIS — Z794 Long term (current) use of insulin: Secondary | ICD-10-CM | POA: Insufficient documentation

## 2016-11-19 DIAGNOSIS — E1165 Type 2 diabetes mellitus with hyperglycemia: Secondary | ICD-10-CM | POA: Insufficient documentation

## 2016-11-19 DIAGNOSIS — Z79899 Other long term (current) drug therapy: Secondary | ICD-10-CM | POA: Insufficient documentation

## 2016-11-19 DIAGNOSIS — R739 Hyperglycemia, unspecified: Secondary | ICD-10-CM

## 2016-11-19 DIAGNOSIS — Z791 Long term (current) use of non-steroidal anti-inflammatories (NSAID): Secondary | ICD-10-CM | POA: Diagnosis not present

## 2016-11-19 DIAGNOSIS — F17210 Nicotine dependence, cigarettes, uncomplicated: Secondary | ICD-10-CM | POA: Diagnosis not present

## 2016-11-19 LAB — CBC
HCT: 43.4 % (ref 35.0–47.0)
Hemoglobin: 15.1 g/dL (ref 12.0–16.0)
MCH: 32.5 pg (ref 26.0–34.0)
MCHC: 34.9 g/dL (ref 32.0–36.0)
MCV: 93.2 fL (ref 80.0–100.0)
Platelets: 240 10*3/uL (ref 150–440)
RBC: 4.66 MIL/uL (ref 3.80–5.20)
RDW: 12.8 % (ref 11.5–14.5)
WBC: 7.9 10*3/uL (ref 3.6–11.0)

## 2016-11-19 LAB — BASIC METABOLIC PANEL
Anion gap: 7 (ref 5–15)
BUN: 20 mg/dL (ref 6–20)
CO2: 25 mmol/L (ref 22–32)
Calcium: 10 mg/dL (ref 8.9–10.3)
Chloride: 102 mmol/L (ref 101–111)
Creatinine, Ser: 0.92 mg/dL (ref 0.44–1.00)
GFR calc Af Amer: >60 mL/min (ref >60)
GFR calc non Af Amer: >60 mL/min (ref >60)
Glucose, Bld: 449 mg/dL — ABNORMAL HIGH (ref 65–99)
Potassium: 4.5 mmol/L (ref 3.5–5.1)
Sodium: 134 mmol/L — ABNORMAL LOW (ref 135–145)

## 2016-11-19 LAB — GLUCOSE, CAPILLARY
Glucose-Capillary: 452 mg/dL — ABNORMAL HIGH (ref 65–99)
Glucose-Capillary: 281 mg/dL — ABNORMAL HIGH (ref 65–99)
Glucose-Capillary: 132 mg/dL — ABNORMAL HIGH (ref 65–99)

## 2016-11-19 LAB — URINALYSIS, COMPLETE (UACMP) WITH MICROSCOPIC
Bacteria, UA: NONE SEEN
Bilirubin Urine: NEGATIVE
Glucose, UA: >=500 mg/dL — AB
Hgb urine dipstick: NEGATIVE
Ketones, ur: 5 mg/dL — AB
Leukocytes, UA: NEGATIVE
Nitrite: NEGATIVE
Protein, ur: NEGATIVE mg/dL
Specific Gravity, Urine: 1.037 — ABNORMAL HIGH (ref 1.005–1.030)
pH: 5 (ref 5.0–8.0)

## 2016-11-19 LAB — POCT PREGNANCY, URINE: Preg Test, Ur: NEGATIVE

## 2016-11-19 MED ORDER — INSULIN ASPART 100 UNIT/ML ~~LOC~~ SOLN
10.0000 [IU] | Freq: Once | SUBCUTANEOUS | Status: AC
Start: 2016-11-19 — End: 2016-11-19
  Administered 2016-11-19: 10 [IU] via INTRAVENOUS
  Filled 2016-11-19: qty 1

## 2016-11-19 MED ORDER — SODIUM CHLORIDE 0.9 % IV BOLUS (SEPSIS)
1000.0000 mL | Freq: Once | INTRAVENOUS | Status: AC
  Administered 2016-11-19: 1000 mL via INTRAVENOUS

## 2016-11-19 NOTE — ED Notes (Signed)
EDP at bedside  

## 2016-11-19 NOTE — ED Provider Notes (Signed)
[facility] Emergency Department Provider Note  Time seen: 5:03 PM  I have reviewed the triage vital signs and the nursing notes.   HISTORY  Chief Complaint Hyperglycemia    HPI Tanya Graves is a 51 y.o. female with a past medical history of diabetes, presents to the emergency department for an elevated blood sugar. According to the patient since yesterday she is feeling somewhat weak and fatigued. She checked her blood sugar yesterday and it was around 250. Patient states she took her insulin and went to bed, when she woke this morning is still somewhat elevated around 210. She went to work states she ate well all day including a salad for lunch however felt very agitated today which she states is normal with her blood sugar being elevated. She rechecked it and it read high. Patient gave herself insulin and tried walking recheck to 20 minutes later but it remained high so she came to the emergency department for evaluation. Patient denies any recent illnesses, cough, congestion, dysuria, abdominal pain and vomiting or diarrhea.   Past Medical History:  Diagnosis Date  . Arthritis   . Diabetes mellitus without complication ([place])   . Thyroid disease     Patient Active Problem List   Diagnosis Date Noted  . Abscess of right thigh   . Cellulitis of right thigh   . Hyperglycemia   . Sepsis affecting skin ([place]) 01/22/2015  . Poorly controlled type 2 diabetes mellitus ([place]) 01/22/2015    Past Surgical History:  Procedure Laterality Date  . APPENDECTOMY  1993  . CALCANEAL OSTEOTOMY Left 07/17/2016   Procedure: CALCANEAL OSTEOTOMY;  Surgeon: [name], MD;  Location: [facility] ORS;  Service: Orthopedics;  Laterality: Left;  . IRRIGATION AND DEBRIDEMENT ABSCESS N/A 01/24/2015   Procedure: IRRIGATION AND DEBRIDEMENT ABSCESS/GROIN ABSCESS;  Surgeon: [name], MD;  Location: [facility] ORS;  Service: General;  Laterality: N/A;  . KNEE ARTHROSCOPY W/ MENISCAL  REPAIR Right 2008  . ROTATOR CUFF REPAIR Right 2015  . TONSILLECTOMY  1998    Prior to Admission medications   Medication Sig Start Date End Date Taking? Authorizing Provider  albuterol (PROVENTIL HFA;VENTOLIN HFA) 108 (90 Base) MCG/ACT inhaler Inhale 1-2 puffs into the lungs every 6 (six) hours as needed for wheezing or shortness of breath.    [provider]  cetirizine (ZYRTEC) 10 MG tablet Take 10 mg by mouth daily.    [provider]  gabapentin (NEURONTIN) 400 MG capsule Take 1 capsule (400 mg total) by mouth 2 (two) times daily. 07/17/16   [name], MD  HYDROcodone-acetaminophen (NORCO) 7.5-325 MG tablet Take 1 tablet by mouth every 6 (six) hours as needed for moderate pain. 07/17/16   [name], MD  Insulin Glargine (TOUJEO SOLOSTAR [place]) Inject 40 Units into the skin every morning.     [provider]  insulin lispro (HUMALOG) 100 UNIT/ML injection Inject 4-15 Units into the skin 3 (three) times daily before meals. Sliding scale per blood sugar readings. BS100-150=4, 151-200=5, 201-250=7, 251-300=8, 301-350=9, over 350=11    [provider]  levothyroxine (SYNTHROID, LEVOTHROID) 175 MCG tablet Take 1 tablet by mouth daily before breakfast.  06/08/16   [provider]  meloxicam (MOBIC) 15 MG tablet Take 1 tablet (15 mg total) by mouth daily. 07/17/16   [name], MD  metFORMIN (GLUCOPHAGE) 500 MG tablet Take 1,000 mg by mouth 2 (two) times daily with a meal.     [provider]  Vitamin D, Ergocalciferol, (  DRISDOL) 50000 units CAPS capsule Take 50,000 Units by mouth every 7 (seven) days. Every Sunday    [provider]    Allergies  Allergen Reactions  . Corticosteroids Other (See Comments)    Elevate blood sugar    Family History  Problem Relation Age of Onset  . Diabetes Other   . Diabetes Father   . Heart failure Father   . Diabetes Brother     Social History Social History  Substance Use Topics   . Smoking status: Current Some Day Smoker    Packs/day: 0.25    Types: Cigarettes  . Smokeless tobacco: Never Used  . Alcohol use Yes     Comment: Occasional    Review of Systems Constitutional: Negative for fever. Eyes: Negative for visual changes. ENT: Negative for congestion Cardiovascular: Negative for chest pain. Respiratory: Negative for shortness of breath. Gastrointestinal: Negative for abdominal pain, vomiting and diarrhea. Genitourinary: Negative for dysuria. Musculoskeletal: Negative for back pain. Neurological: Negative for headache All other ROS negative  ____________________________________________   PHYSICAL EXAM:  VITAL SIGNS: ED Triage Vitals  Enc Vitals Group     BP 11/19/16 1620 (!) 143/90     Pulse Rate 11/19/16 1619 97     Resp 11/19/16 1619 18     Temp 11/19/16 1619 97.8 F (36.6 C)     Temp Source 11/19/16 1619 Oral     SpO2 11/19/16 1619 97 %     Weight 11/19/16 1619 273 lb (123.8 kg)     Height 11/19/16 1619 5\' 8"  (1.727 m)     Head Circumference --      Peak Flow --      Pain Score --      Pain Loc --      Pain Edu? --      Excl. in [place]? --     Constitutional: Alert and oriented. Well appearing and in no distress. Eyes: Normal exam ENT   Head: Normocephalic and atraumatic.   Mouth/Throat: Mucous membranes are moist. Cardiovascular: Normal rate, regular rhythm. No murmur Respiratory: Normal respiratory effort without tachypnea nor retractions. Breath sounds are clear Gastrointestinal: Soft and nontender. No distention.   Musculoskeletal: Nontender with normal range of motion in all extremities. No lower extremity tenderness or edema. Neurologic:  Normal speech and language. No gross focal neurologic deficits  Skin:  Skin is warm, dry and intact.  Psychiatric: Mood and affect are normal.   ____________________________________________    INITIAL IMPRESSION / ASSESSMENT AND PLAN / ED COURSE  Pertinent labs & imaging results  that were available during my care of the patient were reviewed by me and considered in my medical decision making (see chart for details).  The patient presents to the emergency department for an elevated blood glucose. We will check labs, IV hydrate and closely monitor. Overall the patient appears well with a normal physical exam. Negative review of systems. Denies any history of DKA in the past.  Patient's labs are largely within normal limits besides hyperglycemia. Patient has responded very well to insulin. Patient is currently eating, feels much better. We'll discharge home with PCP follow-up. Patient agreeable.  ____________________________________________   FINAL CLINICAL IMPRESSION(S) / ED DIAGNOSES  Hyperglycemia    Harvest Dark, MD 11/19/16 [phone]

## 2016-11-19 NOTE — ED Triage Notes (Addendum)
Pt c/o blood sugar reading high on glucometer today (over 600).  Has not had increased thirst or urination.  Has felt bad but no pain.  Ambulatory to triage.  Reports salad for lunch, has been taking insulin. Took her humalog at 1600 Blood sugar 452 here.

## 2016-11-19 NOTE — ED Notes (Signed)
Pt resting in bed, denies any needs, resp even and unlabored 

## 2017-01-14 ENCOUNTER — Ambulatory Visit: Payer: Self-pay | Admitting: Obstetrics and Gynecology

## 2017-02-10 ENCOUNTER — Ambulatory Visit (INDEPENDENT_AMBULATORY_CARE_PROVIDER_SITE_OTHER): Payer: 59 | Admitting: Obstetrics and Gynecology

## 2017-02-10 ENCOUNTER — Encounter: Payer: Self-pay | Admitting: Obstetrics and Gynecology

## 2017-02-10 VITALS — BP 112/80 | HR 90 | Ht 68.0 in | Wt 273.0 lb

## 2017-02-10 DIAGNOSIS — Z01419 Encounter for gynecological examination (general) (routine) without abnormal findings: Secondary | ICD-10-CM

## 2017-02-10 DIAGNOSIS — R1031 Right lower quadrant pain: Secondary | ICD-10-CM

## 2017-02-10 DIAGNOSIS — Z8041 Family history of malignant neoplasm of ovary: Secondary | ICD-10-CM | POA: Diagnosis not present

## 2017-02-10 DIAGNOSIS — Z1231 Encounter for screening mammogram for malignant neoplasm of breast: Secondary | ICD-10-CM

## 2017-02-10 DIAGNOSIS — Z1211 Encounter for screening for malignant neoplasm of colon: Secondary | ICD-10-CM | POA: Diagnosis not present

## 2017-02-10 DIAGNOSIS — Z1239 Encounter for other screening for malignant neoplasm of breast: Secondary | ICD-10-CM

## 2017-02-10 NOTE — Patient Instructions (Signed)
Preventive Care 40-64 Years, Female Preventive care refers to lifestyle choices and visits with your health care provider that can promote health and wellness. What does preventive care include?  A yearly physical exam. This is also called an annual well check.  Dental exams once or twice a year.  Routine eye exams. Ask your health care provider how often you should have your eyes checked.  Personal lifestyle choices, including: ? Daily care of your teeth and gums. ? Regular physical activity. ? Eating a healthy diet. ? Avoiding tobacco and drug use. ? Limiting alcohol use. ? Practicing safe sex. ? Taking low-dose aspirin daily starting at age 58. ? Taking vitamin and mineral supplements as recommended by your health care provider. What happens during an annual well check? The services and screenings done by your health care provider during your annual well check will depend on your age, overall health, lifestyle risk factors, and family history of disease. Counseling Your health care provider may ask you questions about your:  Alcohol use.  Tobacco use.  Drug use.  Emotional well-being.  Home and relationship well-being.  Sexual activity.  Eating habits.  Work and work Statistician.  Method of birth control.  Menstrual cycle.  Pregnancy history.  Screening You may have the following tests or measurements:  Height, weight, and BMI.  Blood pressure.  Lipid and cholesterol levels. These may be checked every 5 years, or more frequently if you are over 81 years old.  Skin check.  Lung cancer screening. You may have this screening every year starting at age 78 if you have a 30-pack-year history of smoking and currently smoke or have quit within the past 15 years.  Fecal occult blood test (FOBT) of the stool. You may have this test every year starting at age 65.  Flexible sigmoidoscopy or colonoscopy. You may have a sigmoidoscopy every 5 years or a colonoscopy  every 10 years starting at age 30.  Hepatitis C blood test.  Hepatitis B blood test.  Sexually transmitted disease (STD) testing.  Diabetes screening. This is done by checking your blood sugar (glucose) after you have not eaten for a while (fasting). You may have this done every 1-3 years.  Mammogram. This may be done every 1-2 years. Talk to your health care provider about when you should start having regular mammograms. This may depend on whether you have a family history of breast cancer.  BRCA-related cancer screening. This may be done if you have a family history of breast, ovarian, tubal, or peritoneal cancers.  Pelvic exam and Pap test. This may be done every 3 years starting at age 80. Starting at age 36, this may be done every 5 years if you have a Pap test in combination with an HPV test.  Bone density scan. This is done to screen for osteoporosis. You may have this scan if you are at high risk for osteoporosis.  Discuss your test results, treatment options, and if necessary, the need for more tests with your health care provider. Vaccines Your health care provider may recommend certain vaccines, such as:  Influenza vaccine. This is recommended every year.  Tetanus, diphtheria, and acellular pertussis (Tdap, Td) vaccine. You may need a Td booster every 10 years.  Varicella vaccine. You may need this if you have not been vaccinated.  Zoster vaccine. You may need this after age 5.  Measles, mumps, and rubella (MMR) vaccine. You may need at least one dose of MMR if you were born in  1957 or later. You may also need a second dose.  Pneumococcal 13-valent conjugate (PCV13) vaccine. You may need this if you have certain conditions and were not previously vaccinated.  Pneumococcal polysaccharide (PPSV23) vaccine. You may need one or two doses if you smoke cigarettes or if you have certain conditions.  Meningococcal vaccine. You may need this if you have certain  conditions.  Hepatitis A vaccine. You may need this if you have certain conditions or if you travel or work in places where you may be exposed to hepatitis A.  Hepatitis B vaccine. You may need this if you have certain conditions or if you travel or work in places where you may be exposed to hepatitis B.  Haemophilus influenzae type b (Hib) vaccine. You may need this if you have certain conditions.  Talk to your health care provider about which screenings and vaccines you need and how often you need them. This information is not intended to replace advice given to you by your health care provider. Make sure you discuss any questions you have with your health care provider. Document Released: 06/09/2015 Document Revised: 01/31/2016 Document Reviewed: 03/14/2015 Elsevier Interactive Patient Education  2017 [employer].

## 2017-02-10 NOTE — Progress Notes (Signed)
Patient ID: Tanya Graves, female   DOB: 1965-09-07, 51 y.o.   MRN: [id number]     Gynecology Annual Exam  PCP: Casilda Carls, MD  Chief Complaint:  Chief Complaint  Patient presents with  . Gynecologic Exam    wants to talk about starting Chantix  . Pelvic Pain    sharpe pains mostly on right side    History of Present Illness:Patient is a 51 y.o. G1P0010 presents for annual exam. The patient is a 51 y.o. G1P0010 presents for annual exam. The patient has no complaints today.   LMP: No LMP recorded. Patient is premenopausal.  The patient is sexually active. She denies dyspareunia.  The patient does perform self breast exams.  There is no notable family history of breast or ovarian cancer in her family.  The patient wears seatbelts: yes.   The patient has regular exercise: not asked.    The patient denies current symptoms of depression.     Review of Systems: Review of Systems  Constitutional: Negative for chills and fever.  HENT: Negative for congestion.   Respiratory: Negative for cough and shortness of breath.   Cardiovascular: Negative for chest pain and palpitations.  Gastrointestinal: Negative for abdominal pain, constipation, diarrhea, heartburn, nausea and vomiting.  Genitourinary: Negative for dysuria, frequency and urgency.  Skin: Negative for itching and rash.  Neurological: Negative for dizziness and headaches.  Endo/Heme/Allergies: Negative for polydipsia.  Psychiatric/Behavioral: Negative for depression.    Past Medical History:  Past Medical History:  Diagnosis Date  . Arthritis   . Diabetes mellitus without complication ([place])   . Thyroid disease     Past Surgical History:  Past Surgical History:  Procedure Laterality Date  . APPENDECTOMY  1993  . CALCANEAL OSTEOTOMY Left 07/17/2016   Procedure: CALCANEAL OSTEOTOMY;  Surgeon: [name], MD;  Location: [facility] ORS;  Service: Orthopedics;  Laterality: Left;  . IRRIGATION AND DEBRIDEMENT ABSCESS N/A 01/24/2015   Procedure: IRRIGATION AND DEBRIDEMENT  ABSCESS/GROIN ABSCESS;  Surgeon: [name], MD;  Location: [facility] ORS;  Service: General;  Laterality: N/A;  . KNEE ARTHROSCOPY W/ MENISCAL REPAIR Right 2008  . ROTATOR CUFF REPAIR Right 2015  . TONSILLECTOMY  1998    Gynecologic History:  No LMP recorded. Patient is premenopausal. Last Pap: Results were: 07/13/15  NIL and HR HPV negative  Last mammogram: 07/13/15 Results were: BI-RAD I Obstetric History: G1P0010  Family History:  Family History  Problem Relation Age of Onset  . Diabetes Other   . Diabetes Father   . Heart failure Father   . Diabetes Brother     Social History:  Social History   Social History  . Marital status: Divorced    Spouse name: N/A  . Number of children: N/A  . Years of education: N/A   Occupational History  . Not on file.   Social History Main Topics  . Smoking status: Current Some Day Smoker    Packs/day: 0.25    Types: Cigarettes  . Smokeless tobacco: Never Used  . Alcohol use Yes     Comment: Occasional  . Drug use: No  . Sexual activity: Yes    Birth control/ protection: None   Other Topics Concern  . Not on file   Social History Narrative  . No narrative on file    Allergies:  Allergies  Allergen Reactions  . Corticosteroids Other (See Comments)    Elevate blood sugar    Medications: Prior to Admission medications   Medication Sig Start Date End Date Taking? Authorizing Provider  albuterol (PROVENTIL HFA;VENTOLIN HFA)  108 (90 Base) MCG/ACT inhaler Inhale 1-2 puffs into the lungs every 6 (six) hours as needed for wheezing or shortness of breath.   Yes [provider]  cetirizine (ZYRTEC) 10 MG tablet Take 10 mg by mouth daily.   Yes [provider]  Insulin Glargine (TOUJEO SOLOSTAR [place]) Inject 40 Units into the skin every morning.    Yes [provider]  levothyroxine (SYNTHROID, LEVOTHROID) 175 MCG tablet Take 1 tablet by mouth daily before breakfast.  06/08/16  Yes [provider]  metFORMIN (GLUCOPHAGE) 500 MG tablet Take 1,000 mg by mouth 2 (two) times daily with a meal.    Yes [provider]  Vitamin D, Ergocalciferol, (DRISDOL) 50000 units CAPS capsule Take 50,000 Units by mouth every 7 (seven) days. Every Sunday   Yes [provider]    Physical Exam Vitals: Blood pressure 112/80, pulse 90, height '5\' 8"'  (1.727 m), weight 273 lb (123.8 kg).  General: NAD HEENT: normocephalic, anicteric Thyroid: no enlargement, no palpable nodules Pulmonary: No increased work of breathing, CTAB Cardiovascular: RRR, distal pulses 2+ Breast: Breast symmetrical, no tenderness, no palpable nodules or masses, no skin or nipple retraction present, no nipple discharge.  No axillary or supraclavicular lymphadenopathy. Abdomen: NABS, soft, non-tender, non-distended.  Umbilicus without lesions.  No hepatomegaly, splenomegaly or masses palpable. No evidence of hernia  Genitourinary:  External: Normal external female genitalia.  Normal urethral meatus, normal  Bartholin's and Skene's glands.    Vagina: Normal vaginal mucosa, no evidence of prolapse.    Cervix: Grossly normal in appearance, no bleeding  Uterus: Non-enlarged, mobile, normal contour.  No CMT  Adnexa: ovaries non-enlarged, no adnexal masses  Rectal: deferred  Lymphatic: no evidence of inguinal lymphadenopathy Extremities: no edema, erythema, or tenderness Neurologic: Grossly intact Psychiatric: mood appropriate, affect full  Female chaperone present for pelvic and breast  portions of the physical exam     Assessment: 51 y.o. G1P0010 routine annual exam  Plan: Problem List Items Addressed This Visit    None    Visit Diagnoses    Right lower quadrant pain    -  Primary   Relevant Orders   Integrated BRACAnalysis ([facility])   US PELVIS TRANSVANGINAL NON-OB (TV ONLY)   Breast screening       Relevant Orders   MM SCREENING BREAST TOMO BILATERAL   Special screening for  malignant neoplasms, colon       Relevant Orders   Ambulatory referral to Gastroenterology   Encounter for gynecological examination without abnormal finding       Family history of ovarian cancer       Relevant Orders   Integrated BRACAnalysis ([facility])   US PELVIS TRANSVANGINAL NON-OB (TV ONLY)      1) Mammogram - recommend yearly screening mammogram.  Mammogram Was ordered today   2) STI screening was not offered  3) ASCCP guidelines and rational discussed.  Patient opts for every 3 years screening interval  4) Osteoporosis  - per USPTF routine screening DEXA at age 15   5) Routine healthcare maintenance including cholesterol, diabetes screening discussed managed by PCP  6) Colonoscopy  Screening recommended starting at age 3 for average risk individuals, age 31 for individuals deemed at increased risk (including African Americans) and recommended to continue until age 25.  For patient age 22-85 individualized approach is recommended.  Gold standard screening is via colonoscopy, Cologuard screening is an acceptable alternative for patient unwilling or unable to undergo colonoscopy.  "  Colorectal cancer screening for average?risk adults: 2018 guideline update from the American Cancer Society"CA: A Cancer Journal for Clinicians: Oct 23, 2016   7) Right lower quadrant pain - will obtain TUVS to evaluate.  REports family history of ovarian cancer aMyriad Myrisk as time of ultrasound follow up

## 2017-02-13 ENCOUNTER — Ambulatory Visit
Admission: RE | Admit: 2017-02-13 | Discharge: 2017-02-13 | Disposition: A | Discharge status: Home / Self Care | Payer: 59 | Source: Ambulatory Visit | Attending: Obstetrics and Gynecology | Admitting: Obstetrics and Gynecology

## 2017-02-13 DIAGNOSIS — Z1231 Encounter for screening mammogram for malignant neoplasm of breast: Secondary | ICD-10-CM | POA: Insufficient documentation

## 2017-02-13 DIAGNOSIS — Z1239 Encounter for other screening for malignant neoplasm of breast: Secondary | ICD-10-CM

## 2017-02-20 ENCOUNTER — Other Ambulatory Visit: Payer: Self-pay | Admitting: *Deleted

## 2017-02-20 ENCOUNTER — Inpatient Hospital Stay
Admission: RE | Admit: 2017-02-20 | Discharge: 2017-02-20 | Disposition: A | Discharge status: Home / Self Care | Payer: Self-pay | Source: Ambulatory Visit | Attending: *Deleted | Admitting: *Deleted

## 2017-02-20 DIAGNOSIS — Z9289 Personal history of other medical treatment: Secondary | ICD-10-CM

## 2017-02-24 ENCOUNTER — Other Ambulatory Visit: Payer: 59

## 2017-02-24 ENCOUNTER — Ambulatory Visit: Payer: 59 | Admitting: Obstetrics and Gynecology

## 2017-02-25 ENCOUNTER — Telehealth: Payer: Self-pay | Admitting: Obstetrics and Gynecology

## 2017-02-25 ENCOUNTER — Encounter: Payer: Self-pay | Admitting: Obstetrics and Gynecology

## 2017-02-25 NOTE — Telephone Encounter (Signed)
Appointment Request From: Tanya Graves    With Provider: Malachy Mood, MD [[facility]]    Preferred Date Range: From 03/10/2017 To 03/20/2017    Preferred Times: Any    Reason: To address the following health maintenance concerns.  Colonoscopy

## 2017-02-25 NOTE — Telephone Encounter (Signed)
Message sent to [facility], referral coordinator

## 2017-03-05 ENCOUNTER — Other Ambulatory Visit: Payer: 59

## 2017-03-05 ENCOUNTER — Ambulatory Visit: Payer: 59 | Admitting: Obstetrics and Gynecology

## 2017-03-18 ENCOUNTER — Ambulatory Visit: Payer: 59 | Admitting: Obstetrics and Gynecology

## 2017-03-18 ENCOUNTER — Other Ambulatory Visit: Payer: 59

## 2017-03-25 ENCOUNTER — Other Ambulatory Visit: Payer: 59

## 2017-03-25 ENCOUNTER — Ambulatory Visit: Payer: 59 | Admitting: Obstetrics and Gynecology

## 2017-06-13 ENCOUNTER — Emergency Department: Payer: 59

## 2017-06-13 ENCOUNTER — Other Ambulatory Visit: Payer: Self-pay

## 2017-06-13 ENCOUNTER — Inpatient Hospital Stay
Admission: EM | Admit: 2017-06-13 | Discharge: 2017-06-14 | DRG: 638 | Disposition: A | Discharge status: Home / Self Care | Payer: 59 | Attending: Internal Medicine | Admitting: Internal Medicine

## 2017-06-13 ENCOUNTER — Encounter: Payer: Self-pay | Admitting: Emergency Medicine

## 2017-06-13 DIAGNOSIS — Z91138 Patient's unintentional underdosing of medication regimen for other reason: Secondary | ICD-10-CM

## 2017-06-13 DIAGNOSIS — E860 Dehydration: Secondary | ICD-10-CM | POA: Diagnosis present

## 2017-06-13 DIAGNOSIS — T383X6A Underdosing of insulin and oral hypoglycemic [antidiabetic] drugs, initial encounter: Secondary | ICD-10-CM | POA: Diagnosis present

## 2017-06-13 DIAGNOSIS — Z794 Long term (current) use of insulin: Secondary | ICD-10-CM | POA: Diagnosis not present

## 2017-06-13 DIAGNOSIS — Y92009 Unspecified place in unspecified non-institutional (private) residence as the place of occurrence of the external cause: Secondary | ICD-10-CM | POA: Diagnosis not present

## 2017-06-13 DIAGNOSIS — F17210 Nicotine dependence, cigarettes, uncomplicated: Secondary | ICD-10-CM | POA: Diagnosis present

## 2017-06-13 DIAGNOSIS — Z833 Family history of diabetes mellitus: Secondary | ICD-10-CM | POA: Diagnosis not present

## 2017-06-13 DIAGNOSIS — Z79890 Hormone replacement therapy (postmenopausal): Secondary | ICD-10-CM | POA: Diagnosis not present

## 2017-06-13 DIAGNOSIS — Z888 Allergy status to other drugs, medicaments and biological substances status: Secondary | ICD-10-CM | POA: Diagnosis not present

## 2017-06-13 DIAGNOSIS — E039 Hypothyroidism, unspecified: Secondary | ICD-10-CM | POA: Diagnosis present

## 2017-06-13 DIAGNOSIS — E1110 Type 2 diabetes mellitus with ketoacidosis without coma: Secondary | ICD-10-CM | POA: Diagnosis present

## 2017-06-13 DIAGNOSIS — N179 Acute kidney failure, unspecified: Secondary | ICD-10-CM | POA: Diagnosis present

## 2017-06-13 LAB — CBC WITH DIFFERENTIAL/PLATELET
Basophils Absolute: 0 10*3/uL (ref 0–0.1)
Basophils Relative: 0 %
Eosinophils Absolute: 0 10*3/uL (ref 0–0.7)
Eosinophils Relative: 0 %
HCT: 52.5 % — ABNORMAL HIGH (ref 35.0–47.0)
Hemoglobin: 16.5 g/dL — ABNORMAL HIGH (ref 12.0–16.0)
Lymphocytes Relative: 12 %
Lymphs Abs: 2.3 10*3/uL (ref 1.0–3.6)
MCH: 32.6 pg (ref 26.0–34.0)
MCHC: 31.5 g/dL — ABNORMAL LOW (ref 32.0–36.0)
MCV: 103.4 fL — ABNORMAL HIGH (ref 80.0–100.0)
Monocytes Absolute: 0.6 10*3/uL (ref 0.2–0.9)
Monocytes Relative: 3 %
Neutro Abs: 16.4 10*3/uL — ABNORMAL HIGH (ref 1.4–6.5)
Neutrophils Relative %: 85 %
Platelets: 302 10*3/uL (ref 150–440)
RBC: 5.08 MIL/uL (ref 3.80–5.20)
RDW: 13.9 % (ref 11.5–14.5)
WBC: 19.3 10*3/uL — ABNORMAL HIGH (ref 3.6–11.0)

## 2017-06-13 LAB — BASIC METABOLIC PANEL
Anion gap: 19 — ABNORMAL HIGH (ref 5–15)
Anion gap: 15 (ref 5–15)
BUN: 22 mg/dL — ABNORMAL HIGH (ref 6–20)
BUN: 21 mg/dL — ABNORMAL HIGH (ref 6–20)
BUN: 20 mg/dL (ref 6–20)
CO2: <7 mmol/L — ABNORMAL LOW (ref 22–32)
CO2: 11 mmol/L — ABNORMAL LOW (ref 22–32)
CO2: 13 mmol/L — ABNORMAL LOW (ref 22–32)
Calcium: 8.1 mg/dL — ABNORMAL LOW (ref 8.9–10.3)
Calcium: 7.9 mg/dL — ABNORMAL LOW (ref 8.9–10.3)
Calcium: 7.9 mg/dL — ABNORMAL LOW (ref 8.9–10.3)
Chloride: 104 mmol/L (ref 101–111)
Chloride: 107 mmol/L (ref 101–111)
Chloride: 108 mmol/L (ref 101–111)
Creatinine, Ser: 1.31 mg/dL — ABNORMAL HIGH (ref 0.44–1.00)
Creatinine, Ser: 1.2 mg/dL — ABNORMAL HIGH (ref 0.44–1.00)
Creatinine, Ser: 1 mg/dL (ref 0.44–1.00)
GFR calc Af Amer: 54 mL/min — ABNORMAL LOW (ref >60)
GFR calc Af Amer: >60 mL/min (ref >60)
GFR calc Af Amer: >60 mL/min (ref >60)
GFR calc non Af Amer: 47 mL/min — ABNORMAL LOW (ref >60)
GFR calc non Af Amer: 52 mL/min — ABNORMAL LOW (ref >60)
GFR calc non Af Amer: >60 mL/min (ref >60)
Glucose, Bld: 422 mg/dL — ABNORMAL HIGH (ref 65–99)
Glucose, Bld: 205 mg/dL — ABNORMAL HIGH (ref 65–99)
Glucose, Bld: 199 mg/dL — ABNORMAL HIGH (ref 65–99)
Potassium: 4.6 mmol/L (ref 3.5–5.1)
Potassium: 4.1 mmol/L (ref 3.5–5.1)
Potassium: 3.8 mmol/L (ref 3.5–5.1)
Sodium: 136 mmol/L (ref 135–145)
Sodium: 137 mmol/L (ref 135–145)
Sodium: 136 mmol/L (ref 135–145)

## 2017-06-13 LAB — COMPREHENSIVE METABOLIC PANEL
ALT: 14 U/L (ref 14–54)
AST: 22 U/L (ref 15–41)
Albumin: 4.6 g/dL (ref 3.5–5.0)
Alkaline Phosphatase: 119 U/L (ref 38–126)
BUN: 22 mg/dL — ABNORMAL HIGH (ref 6–20)
CO2: <7 mmol/L — ABNORMAL LOW (ref 22–32)
Calcium: 8.9 mg/dL (ref 8.9–10.3)
Chloride: 92 mmol/L — ABNORMAL LOW (ref 101–111)
Creatinine, Ser: 1.57 mg/dL — ABNORMAL HIGH (ref 0.44–1.00)
GFR calc Af Amer: 43 mL/min — ABNORMAL LOW (ref >60)
GFR calc non Af Amer: 37 mL/min — ABNORMAL LOW (ref >60)
Glucose, Bld: 751 mg/dL (ref 65–99)
Potassium: 5.2 mmol/L — ABNORMAL HIGH (ref 3.5–5.1)
Sodium: 130 mmol/L — ABNORMAL LOW (ref 135–145)
Total Bilirubin: 1.8 mg/dL — ABNORMAL HIGH (ref 0.3–1.2)
Total Protein: 8.2 g/dL — ABNORMAL HIGH (ref 6.5–8.1)

## 2017-06-13 LAB — URINALYSIS, COMPLETE (UACMP) WITH MICROSCOPIC
Bacteria, UA: NONE SEEN
Bilirubin Urine: NEGATIVE
Glucose, UA: >=500 mg/dL — AB
Ketones, ur: 80 mg/dL — AB
Leukocytes, UA: NEGATIVE
Nitrite: NEGATIVE
Protein, ur: 30 mg/dL — AB
Specific Gravity, Urine: 1.021 (ref 1.005–1.030)
pH: 5 (ref 5.0–8.0)

## 2017-06-13 LAB — GLUCOSE, CAPILLARY
Glucose-Capillary: 148 mg/dL — ABNORMAL HIGH (ref 65–99)
Glucose-Capillary: 150 mg/dL — ABNORMAL HIGH (ref 65–99)
Glucose-Capillary: 170 mg/dL — ABNORMAL HIGH (ref 65–99)
Glucose-Capillary: 175 mg/dL — ABNORMAL HIGH (ref 65–99)
Glucose-Capillary: 179 mg/dL — ABNORMAL HIGH (ref 65–99)
Glucose-Capillary: 188 mg/dL — ABNORMAL HIGH (ref 65–99)
Glucose-Capillary: 198 mg/dL — ABNORMAL HIGH (ref 65–99)
Glucose-Capillary: 203 mg/dL — ABNORMAL HIGH (ref 65–99)
Glucose-Capillary: 292 mg/dL — ABNORMAL HIGH (ref 65–99)
Glucose-Capillary: 376 mg/dL — ABNORMAL HIGH (ref 65–99)
Glucose-Capillary: 414 mg/dL — ABNORMAL HIGH (ref 65–99)
Glucose-Capillary: 501 mg/dL (ref 65–99)
Glucose-Capillary: 547 mg/dL (ref 65–99)
Glucose-Capillary: >600 mg/dL (ref 65–99)
Glucose-Capillary: >600 mg/dL (ref 65–99)

## 2017-06-13 LAB — HEMOGLOBIN A1C
Hgb A1c MFr Bld: 12.5 % — ABNORMAL HIGH (ref 4.8–5.6)
Mean Plasma Glucose: 312.05 mg/dL

## 2017-06-13 LAB — TROPONIN I: Troponin I: <0.03 ng/mL (ref <0.03)

## 2017-06-13 LAB — POCT PREGNANCY, URINE: Preg Test, Ur: NEGATIVE

## 2017-06-13 LAB — LIPASE, BLOOD: Lipase: 45 U/L (ref 11–51)

## 2017-06-13 MED ORDER — ALBUTEROL SULFATE (2.5 MG/3ML) 0.083% IN NEBU: 2.5000 mg | INHALATION_SOLUTION | RESPIRATORY_TRACT | Status: DC | PRN

## 2017-06-13 MED ORDER — TRAZODONE HCL 50 MG PO TABS: 50.0000 mg | ORAL_TABLET | Freq: Every evening | ORAL | Status: DC | PRN

## 2017-06-13 MED ORDER — MORPHINE SULFATE (PF) 4 MG/ML IV SOLN
4.0000 mg | Freq: Once | INTRAVENOUS | Status: AC
  Administered 2017-06-13: 4 mg via INTRAVENOUS

## 2017-06-13 MED ORDER — SODIUM CHLORIDE 0.9 % IV SOLN
INTRAVENOUS | Status: DC
  Administered 2017-06-13: 14:00:00 via INTRAVENOUS

## 2017-06-13 MED ORDER — SODIUM CHLORIDE 0.9 % IV BOLUS (SEPSIS)
1000.0000 mL | Freq: Once | INTRAVENOUS | Status: AC
  Administered 2017-06-13: 1000 mL via INTRAVENOUS

## 2017-06-13 MED ORDER — SODIUM CHLORIDE 0.9 % IV SOLN
INTRAVENOUS | Status: AC
  Administered 2017-06-13: 9.7 [IU]/h via INTRAVENOUS
  Administered 2017-06-13: 5.5 [IU]/h via INTRAVENOUS
  Administered 2017-06-13: 5.4 [IU]/h via INTRAVENOUS
  Filled 2017-06-13 (×3): qty 1

## 2017-06-13 MED ORDER — MORPHINE SULFATE (PF) 2 MG/ML IV SOLN
2.0000 mg | Freq: Once | INTRAVENOUS | Status: AC
  Administered 2017-06-13: 2 mg via INTRAVENOUS
  Filled 2017-06-13: qty 1

## 2017-06-13 MED ORDER — LORAZEPAM 2 MG/ML IJ SOLN: 0.5000 mg | Freq: Once | INTRAMUSCULAR | Status: DC

## 2017-06-13 MED ORDER — MORPHINE SULFATE (PF) 4 MG/ML IV SOLN
INTRAVENOUS | Status: AC
  Filled 2017-06-13: qty 1

## 2017-06-13 MED ORDER — ONDANSETRON HCL 4 MG PO TABS: 4.0000 mg | ORAL_TABLET | Freq: Four times a day (QID) | ORAL | Status: DC | PRN

## 2017-06-13 MED ORDER — MORPHINE SULFATE (PF) 2 MG/ML IV SOLN: 2.0000 mg | INTRAVENOUS | Status: DC | PRN

## 2017-06-13 MED ORDER — SODIUM CHLORIDE 0.9 % IV BOLUS (SEPSIS): 1000.0000 mL | Freq: Once | INTRAVENOUS | Status: DC

## 2017-06-13 MED ORDER — HYDROCODONE-ACETAMINOPHEN 5-325 MG PO TABS: 1.0000 | ORAL_TABLET | ORAL | Status: DC | PRN

## 2017-06-13 MED ORDER — FAMOTIDINE IN NACL 20-0.9 MG/50ML-% IV SOLN
20.0000 mg | Freq: Once | INTRAVENOUS | Status: AC
  Administered 2017-06-13: 20 mg via INTRAVENOUS
  Filled 2017-06-13: qty 50

## 2017-06-13 MED ORDER — SODIUM CHLORIDE 0.9% FLUSH
3.0000 mL | Freq: Two times a day (BID) | INTRAVENOUS | Status: DC
  Administered 2017-06-13 – 2017-06-14 (×3): 3 mL via INTRAVENOUS

## 2017-06-13 MED ORDER — DEXTROSE-NACL 5-0.45 % IV SOLN
INTRAVENOUS | Status: DC
  Administered 2017-06-13: 17:00:00 via INTRAVENOUS

## 2017-06-13 MED ORDER — ENOXAPARIN SODIUM 40 MG/0.4ML ~~LOC~~ SOLN: 40.0000 mg | SUBCUTANEOUS | Status: DC

## 2017-06-13 MED ORDER — ONDANSETRON HCL 4 MG/2ML IJ SOLN
4.0000 mg | Freq: Four times a day (QID) | INTRAMUSCULAR | Status: DC | PRN
  Administered 2017-06-13: 4 mg via INTRAVENOUS
  Filled 2017-06-13: qty 2

## 2017-06-13 MED ORDER — POLYETHYLENE GLYCOL 3350 17 G PO PACK: 17.0000 g | PACK | Freq: Every day | ORAL | Status: DC | PRN

## 2017-06-13 MED ORDER — ACETAMINOPHEN 325 MG PO TABS
650.0000 mg | ORAL_TABLET | Freq: Four times a day (QID) | ORAL | Status: DC | PRN
  Administered 2017-06-13 – 2017-06-14 (×2): 650 mg via ORAL
  Filled 2017-06-13 (×2): qty 2

## 2017-06-13 MED ORDER — ONDANSETRON HCL 4 MG/2ML IJ SOLN
4.0000 mg | Freq: Once | INTRAMUSCULAR | Status: AC
  Administered 2017-06-13: 4 mg via INTRAVENOUS
  Filled 2017-06-13: qty 2

## 2017-06-13 MED ORDER — ACETAMINOPHEN 650 MG RE SUPP: 650.0000 mg | Freq: Four times a day (QID) | RECTAL | Status: DC | PRN

## 2017-06-13 NOTE — Plan of Care (Signed)
Pt oriented to room and pt info guide, admission completed

## 2017-06-13 NOTE — H&P (Signed)
[facility] NAME: Tanya Graves    MR#:  [id number]  DATE OF BIRTH:  [birth year]  DATE OF ADMISSION:  06/13/2017  PRIMARY CARE PHYSICIAN: Casilda Carls, MD   REQUESTING/REFERRING PHYSICIAN: Dr. Derry Skill  CHIEF COMPLAINT:   Chief Complaint  Patient presents with  . Hyperglycemia  . Shortness of Breath  . Emesis   HISTORY OF PRESENT ILLNESS:  Tanya Graves  is a 52 y.o. female with a known history of insulin-dependent diabetes mellitus, hypothyroidism presents to the emergency room with 3 days of vomiting, epigastric pain.  She has had severe cold but seems to be slowly improving.  She has not taken insulin 2 days due to vomiting and unable to eat anything.  Here in the emergency room she has been found to have severe DKA with blood sugars greater than 600 bicarb less than 7 and increased anion gap.  Patient is being admitted being critically ill to the ICU.  PAST MEDICAL HISTORY:   Past Medical History:  Diagnosis Date  . Arthritis   . Diabetes mellitus without complication ([place])   . Thyroid disease     PAST SURGICAL HISTORY:   Past Surgical History:  Procedure Laterality Date  . APPENDECTOMY  1993  . CALCANEAL OSTEOTOMY Left 07/17/2016   Procedure: CALCANEAL OSTEOTOMY;  Surgeon: [name], MD;  Location: [facility] ORS;  Service: Orthopedics;  Laterality: Left;  . IRRIGATION AND DEBRIDEMENT ABSCESS N/A 01/24/2015   Procedure: IRRIGATION AND DEBRIDEMENT ABSCESS/GROIN ABSCESS;  Surgeon: [name], MD;  Location: [facility] ORS;  Service: General;  Laterality: N/A;  . KNEE ARTHROSCOPY W/ MENISCAL REPAIR Right 2008  . ROTATOR CUFF REPAIR Right 2015  . TONSILLECTOMY  1998    SOCIAL HISTORY:   Social History   Tobacco Use  . Smoking status: Current Some Day Smoker    Packs/day: 0.25    Types: Cigarettes  . Smokeless tobacco: Never Used  Substance Use Topics  . Alcohol use: Yes    Comment: Occasional    FAMILY  HISTORY:   Family History  Problem Relation Age of Onset  . Diabetes Other   . Diabetes Father   . Heart failure Father   . Diabetes Brother   . Breast cancer Neg Hx     DRUG ALLERGIES:   Allergies  Allergen Reactions  . Corticosteroids Other (See Comments)    Elevate blood sugar    REVIEW OF SYSTEMS:   Review of Systems  Constitutional: Positive for malaise/fatigue. Negative for chills, fever and weight loss.  HENT: Negative for hearing loss and nosebleeds.   Eyes: Negative for blurred vision, double vision and pain.  Respiratory: Negative for cough, hemoptysis, sputum production, shortness of breath and wheezing.   Cardiovascular: Negative for chest pain, palpitations, orthopnea and leg swelling.  Gastrointestinal: Positive for abdominal pain, nausea and vomiting. Negative for constipation and diarrhea.  Genitourinary: Negative for dysuria and hematuria.  Musculoskeletal: Negative for back pain, falls and myalgias.  Skin: Negative for rash.  Neurological: Positive for weakness. Negative for dizziness, tremors, sensory change, speech change, focal weakness, seizures and headaches.  Endo/Heme/Allergies: Does not bruise/bleed easily.  Psychiatric/Behavioral: Negative for depression and memory loss. The patient is not nervous/anxious.     MEDICATIONS AT HOME:   Prior to Admission medications   Medication Sig Start Date End Date Taking? Authorizing Provider  albuterol (PROVENTIL HFA;VENTOLIN HFA) 108 (90 Base) MCG/ACT inhaler Inhale 1-2 puffs into the lungs every 6 (six) hours as needed  for wheezing or shortness of breath.    [provider]  cetirizine (ZYRTEC) 10 MG tablet Take 10 mg by mouth daily.    [provider]  Insulin Glargine (TOUJEO SOLOSTAR [place]) Inject 40 Units into the skin every morning.     [provider]  levothyroxine (SYNTHROID, LEVOTHROID) 175 MCG tablet Take 1 tablet by mouth daily before breakfast.  06/08/16   [provider]  metFORMIN (GLUCOPHAGE) 500 MG tablet Take 1,000 mg by mouth 2 (two) times daily with a meal.     [provider]  Vitamin D, Ergocalciferol, (DRISDOL) 50000 units CAPS capsule Take 50,000 Units by mouth every 7 (seven) days. Every Sunday    [provider]     VITAL SIGNS:  Blood pressure (!) 163/78, pulse (!) 124, temperature 97.6 F (36.4 C), temperature source Axillary, resp. rate (!) 21, height 5\' 9"  (1.753 m), weight 117.9 kg (260 lb), last menstrual period 06/28/2015, SpO2 100 %.  PHYSICAL EXAMINATION:  Physical Exam  GENERAL:  52 y.o.-year-old patient lying in the bed with no acute distress.  EYES: Pupils equal, round, reactive to light and accommodation. No scleral icterus. Extraocular muscles intact.  HEENT: Head atraumatic, normocephalic. Oropharynx and nasopharynx clear. No oropharyngeal erythema, dry oral mucosa  NECK:  Supple, no jugular venous distention. No thyroid enlargement, no tenderness.  LUNGS: Normal breath sounds bilaterally, no wheezing, rales, rhonchi. No use of accessory muscles of respiration.  CARDIOVASCULAR: S1, S2 normal. No murmurs, rubs, or gallops.  ABDOMEN: Soft, epigastric and bilateral flank tenderness, nondistended. Bowel sounds present. No organomegaly or mass.  EXTREMITIES: No pedal edema, cyanosis, or clubbing. + 2 pedal & radial pulses b/l.  NEUROLOGIC: Cranial nerves II through XII are intact. No focal Motor or sensory deficits appreciated b/l PSYCHIATRIC: The patient is alert and oriented x 3.  Anxious SKIN: No obvious rash, lesion, or ulcer.   LABORATORY PANEL:   CBC Recent Labs  Lab 06/13/17 0915  WBC 19.3*  HGB 16.5*  HCT 52.5*  PLT 302   ------------------------------------------------------------------------------------------------------------------  Chemistries  Recent Labs  Lab 06/13/17 0915  NA 130*  K 5.2*  CL 92*  CO2 <7*  GLUCOSE 751*  BUN 22*  CREATININE 1.57*  CALCIUM 8.9  AST 22   ALT 14  ALKPHOS 119  BILITOT 1.8*   ------------------------------------------------------------------------------------------------------------------  Cardiac Enzymes Recent Labs  Lab 06/13/17 0915  TROPONINI <0.03   ------------------------------------------------------------------------------------------------------------------  RADIOLOGY:  Dg Chest Portable 1 View  Result Date: 06/13/2017 CLINICAL DATA:  Cough. EXAM: PORTABLE CHEST 1 VIEW COMPARISON:  Radiographs of June 30, 2014. FINDINGS: The heart size and mediastinal contours are within normal limits. Both lungs are clear. No pneumothorax or pleural effusion is noted. The visualized skeletal structures are unremarkable. IMPRESSION: No acute cardiopulmonary abnormality seen. Electronically Signed   By: Marijo Conception, M.D.   On: 06/13/2017 09:52     IMPRESSION AND PLAN:   * DKA due to missing insulin Start DKA protocol.  IV insulin.  Fluid bolus.  We will bolus additional 1 L normal saline stat.  BMP every 4 hours.  Accu-Cheks every hour. Patient is extremely anxious and also unable to tell me how much insulin she takes right now after her dose of morphine.  Can transition to home dose of Lantus and start diet when anion gap is normal and bicarb greater than 18.  * Leukocytosis likely due to hemoconcentration and stress reaction.  No signs of infection.  Repeat labs.  *  Vomiting and abdominal pain likely due to DKA.  Lipase normal.  Improved.  * Acute kidney injury due to dehydration.  Should improve with IV fluid resuscitation.  *DVT prophylaxis with Lovenox  All the records are reviewed and case discussed with ED provider. Management plans discussed with the patient, family and they are in agreement.  CODE STATUS: FULL CODE  TOTAL TIME TAKING CARE OF THIS PATIENT: 40 minutes.   Neita Carp M.D on 06/13/2017 at 11:49 AM  Between 7am to 6pm - Pager - [phone]  After 6pm go to [email or web address] - password  [facility]  Office  [phone]  CC: Primary care physician; Casilda Carls, MD  Note: This dictation was prepared with Dragon dictation along with smaller phrase technology. Any transcriptional errors that result from this process are unintentional.

## 2017-06-13 NOTE — Consult Note (Signed)
Pt admitted with DKA I have reviewed admission notes and orders PCCM service will help monitor her therapy and course while she is in the ICU  Merton Border, MD PCCM service Mobile [phone] Pager [phone] 06/13/2017 3:15 PM

## 2017-06-13 NOTE — ED Notes (Signed)
Pt states back pain has gone down to a 3 but now she report severe upper abdominal pain. Pt reports pain is sharp and is equal on both sides

## 2017-06-13 NOTE — ED Provider Notes (Signed)
[facility] Emergency Department Provider Note ____________________________________________   First MD Initiated Contact with Patient 06/13/17 [phone]     (approximate)  I have reviewed the triage vital signs and the nursing notes.   HISTORY  Chief Complaint Hyperglycemia; Shortness of Breath; and Emesis    HPI Tanya Graves is a 52 y.o. female with a history of DM on insulin and other past medical history as noted below who presents with shortness of breath, gradual onset over the last day, persistent course, and associated with nausea, several episodes of vomiting, and pain in the mid back and epigastric area.  Patient also reports associated hyperglycemia.  The patient states that she has had some nonproductive cough and URI type symptoms over the last 1-2 weeks and then developed nausea and vomiting 2 days ago.  She denies associated diarrhea.  She reports that with the coughing she developed the pain in the back and in the upper belly.  She states that she has not taken her sliding scale insulin in the last 2 days because she her p.o. intake has been minimal.  She has been trying to drink as much water and Pedialyte as possible but still feels very dehydrated.  Past Medical History:  Diagnosis Date  . Arthritis   . Diabetes mellitus without complication ([place])   . Thyroid disease     Patient Active Problem List   Diagnosis Date Noted  . Abscess of right thigh   . Cellulitis of right thigh   . Hyperglycemia   . Sepsis affecting skin ([place]) 01/22/2015  . Poorly controlled type 2 diabetes mellitus ([place]) 01/22/2015    Past Surgical History:  Procedure Laterality Date  . APPENDECTOMY  1993  . CALCANEAL OSTEOTOMY Left 07/17/2016   Procedure: CALCANEAL OSTEOTOMY;  Surgeon: [name], MD;  Location: [facility] ORS;  Service: Orthopedics;  Laterality: Left;  . IRRIGATION AND DEBRIDEMENT ABSCESS N/A 01/24/2015   Procedure: IRRIGATION AND DEBRIDEMENT  ABSCESS/GROIN ABSCESS;  Surgeon: [name], MD;  Location: [facility] ORS;  Service: General;  Laterality: N/A;  . KNEE ARTHROSCOPY W/ MENISCAL REPAIR Right 2008  . ROTATOR CUFF REPAIR Right 2015  . TONSILLECTOMY  1998    Prior to Admission medications   Medication Sig Start Date End Date Taking? Authorizing Provider  albuterol (PROVENTIL HFA;VENTOLIN HFA) 108 (90 Base) MCG/ACT inhaler Inhale 1-2 puffs into the lungs every 6 (six) hours as needed for wheezing or shortness of breath.    [provider]  cetirizine (ZYRTEC) 10 MG tablet Take 10 mg by mouth daily.    [provider]  Insulin Glargine (TOUJEO SOLOSTAR [place]) Inject 40 Units into the skin every morning.     [provider]  levothyroxine (SYNTHROID, LEVOTHROID) 175 MCG tablet Take 1 tablet by mouth daily before breakfast.  06/08/16   [provider]  metFORMIN (GLUCOPHAGE) 500 MG tablet Take 1,000 mg by mouth 2 (two) times daily with a meal.     [provider]  Vitamin D, Ergocalciferol, (DRISDOL) 50000 units CAPS capsule Take 50,000 Units by mouth every 7 (seven) days. Every Sunday    [provider]    Allergies Corticosteroids  Family History  Problem Relation Age of Onset  . Diabetes Other   . Diabetes Father   . Heart failure Father   . Diabetes Brother   . Breast cancer Neg Hx     Social History Social History   Tobacco Use  . Smoking status: Current Some Day Smoker  Packs/day: 0.25    Types: Cigarettes  . Smokeless tobacco: Never Used  Substance Use Topics  . Alcohol use: Yes    Comment: Occasional  . Drug use: No    Review of Systems  Constitutional: No fever. Eyes: No redness. ENT: No sore throat. Cardiovascular: Denies chest pain. Respiratory: Positive for shortness of breath. Gastrointestinal: Positive for nausea and vomiting.  No diarrhea.  Genitourinary: Negative for dysuria.  Positive for polyuria. Musculoskeletal: Positive for back  pain. Skin: Negative for rash. Neurological: Negative for headache.   ____________________________________________   PHYSICAL EXAM:  VITAL SIGNS: ED Triage Vitals  Enc Vitals Group     BP 06/13/17 0918 (!) 157/97     Pulse Rate 06/13/17 0918 (!) 127     Resp 06/13/17 0918 (!) 32     Temp 06/13/17 0918 97.6 F (36.4 C)     Temp Source 06/13/17 0918 Axillary     SpO2 06/13/17 0918 99 %     Weight 06/13/17 0914 260 lb (117.9 kg)     Height 06/13/17 0914 5\' 9"  (1.753 m)     Head Circumference --      Peak Flow --      Pain Score 06/13/17 0914 10     Pain Loc --      Pain Edu? --      Excl. in [place]? --     Constitutional: Alert and oriented.  Uncomfortable appearing. Eyes: Conjunctivae are normal.  No scleral icterus Head: Atraumatic. Nose: No congestion/rhinnorhea. Mouth/Throat: Mucous membranes are very dry.   Neck: Normal range of motion.  Cardiovascular: Tachycardic, regular rhythm. Grossly normal heart sounds.  Good peripheral circulation. Respiratory: Increased respiratory effort.  No acute distress.  No retractions. Lungs CTAB. Gastrointestinal: Soft with mild epigastric tenderness. No distention.  Genitourinary: No CVA tenderness. Musculoskeletal: No lower extremity edema.  Extremities warm and well perfused.  Mild lower thoracic/upper lumbar bilateral muscular tenderness.  No midline spinal tenderness. Neurologic:  Normal speech and language. No gross focal neurologic deficits are appreciated.  Skin:  Skin is warm and dry. No rash noted. Psychiatric: Mood and affect are normal. Speech and behavior are normal.  ____________________________________________   LABS (all labs ordered are listed, but only abnormal results are displayed)  Labs Reviewed  COMPREHENSIVE METABOLIC PANEL - Abnormal; Notable for the following components:      Result Value   Sodium 130 (*)    Potassium 5.2 (*)    Chloride 92 (*)    CO2 <7 (*)    Glucose, Bld 751 (*)    BUN 22 (*)     Creatinine, Ser 1.57 (*)    Total Protein 8.2 (*)    Total Bilirubin 1.8 (*)    GFR calc non Af Amer 37 (*)    GFR calc Af Amer 43 (*)    All other components within normal limits  CBC WITH DIFFERENTIAL/PLATELET - Abnormal; Notable for the following components:   WBC 19.3 (*)    Hemoglobin 16.5 (*)    HCT 52.5 (*)    MCV 103.4 (*)    MCHC 31.5 (*)    Neutro Abs 16.4 (*)    All other components within normal limits  BLOOD GAS, VENOUS - Abnormal; Notable for the following components:   pH, Ven <6.900 (*)    pCO2, Ven 23 (*)    pO2, Ven 51.0 (*)    All other components within normal limits  GLUCOSE, CAPILLARY - Abnormal; Notable for the following components:  Glucose-Capillary >600 (*)    All other components within normal limits  GLUCOSE, CAPILLARY - Abnormal; Notable for the following components:   Glucose-Capillary >600 (*)    All other components within normal limits  LIPASE, BLOOD  TROPONIN I  URINALYSIS, COMPLETE (UACMP) WITH MICROSCOPIC   ____________________________________________  EKG  ED ECG REPORT I, Arta Silence, the attending physician, personally viewed and interpreted this ECG.  Date: 06/13/2017 EKG Time: 921 Rate: 126 Rhythm: Sinus tachycardia QRS Axis: normal Intervals: normal ST/T Wave abnormalities: Borderline repolarization abnormality Narrative Interpretation: no evidence of acute ischemia  ____________________________________________  RADIOLOGY  CXR: No focal infiltrate or other acute findings  ____________________________________________   PROCEDURES  Procedure(s) performed: No    Critical Care performed: Yes  CRITICAL CARE Performed by: Arta Silence   Total critical care time: 40 minutes  Critical care time was exclusive of separately billable procedures and treating other patients.  Critical care was necessary to treat or prevent imminent or life-threatening deterioration.  Critical care was time spent  personally by me on the following activities: development of treatment plan with patient and/or surrogate as well as nursing, discussions with consultants, evaluation of patient's response to treatment, examination of patient, obtaining history from patient or surrogate, ordering and performing treatments and interventions, ordering and review of laboratory studies, ordering and review of radiographic studies, pulse oximetry and re-evaluation of patient's condition. ____________________________________________   INITIAL IMPRESSION / ASSESSMENT AND PLAN / ED COURSE  Pertinent labs & imaging results that were available during my care of the patient were reviewed by me and considered in my medical decision making (see chart for details).  52 year old female with past medical history as noted above including diabetes on insulin presents with shortness of breath acute onset today, associated with nausea and vomiting over the last 2 days and with epigastric and mid back pain.  She also reports cough and URI symptoms over at least the last week.  She has noted hyperglycemia at home, and EMS got a glucose reading in the 300s.  Patient reports she has not taken her insulin due to her lack of p.o. intake in the last 2 days.  I reviewed the past medical records in epic; the patient was last seen in the ED several months ago for hypoglycemia and discharged home.  On exam, the patient is visibly short of breath and uncomfortable appearing but in no acute distress.  She is tachycardic and slightly hypertensive but the other vital signs are normal.  Neuro is nonfocal, the lungs are clear, the abdomen is soft with minimal epigastric tenderness.  Mucous membranes are extremely dry.  Differential includes hyperglycemia with dehydration, DKA, HHS, with likely viral syndrome as precipitating cause.  Also consider gastritis, gastroenteritis, pancreatitis, or other hepatobiliary cause although this is less likely.  I have  a low suspicion for cardiac or primary pulmonary cause given the clear lungs, and the presence of the other symptoms and hyperglycemia.  Although the patient is tachypneic and has back pain, I do not suspect PE given that there is no hypoxia as well as the presence of vomiting and cough at the onset of the symptoms which suggest the etiology is listed above.  I also do not suspect aortic dissection or other vascular cause given that the patient has no chest pain, no history of vascular disease, and again the presence of vomiting, cough, and hyperglycemia.  Plan: Basic labs, DKA workup, IV fluids, chest x-ray, symptomatic treatment, and reassess.  ----------------------------------------- 11:41 AM  on 06/13/2017 -----------------------------------------  Lab workup is consistent with DKA with very low pH and elevated glucose.  Chest x-ray is negative.  Patient's symptoms, particularly nausea and epigastric pain are improved although she continues to cough intermittently.  Her respiratory status is improved as well.  I explained to the patient the likely diagnosis and the plan of care.  Patient was placed on an insulin drip after the VBG was resulted.  I signed the patient out to the hospitalist for admission. ____________________________________________   FINAL CLINICAL IMPRESSION(S) / ED DIAGNOSES  Final diagnoses:  Diabetic ketoacidosis without coma associated with type 2 diabetes mellitus ([place])      NEW MEDICATIONS STARTED DURING THIS VISIT:  New Prescriptions   No medications on file     Note:  This document was prepared using Dragon voice recognition software and may include unintentional dictation errors.    Arta Silence, MD 06/13/17 [phone]

## 2017-06-13 NOTE — ED Notes (Signed)
Date and time results received: 06/13/17 9:46 AM (use smartphrase ".now" to insert current time)  Test: pH Critical Value: less than 6.9  Name of Provider Notified: Dr. Cherylann Banas  Orders Received? Or Actions Taken?: Orders Received - See Orders for details

## 2017-06-13 NOTE — ED Triage Notes (Signed)
Pt arrived via EMS from work for reports of hyperglycemia, cough and cold symptoms for two weeks, nausea and vomiting for two days and lower back pain. EMS reports VSS. CBG 336. Pt reports has taken metformin but has not taken sliding scale insulin due to not eating and vomiting.

## 2017-06-14 LAB — GLUCOSE, CAPILLARY
Glucose-Capillary: 124 mg/dL — ABNORMAL HIGH (ref 65–99)
Glucose-Capillary: 130 mg/dL — ABNORMAL HIGH (ref 65–99)
Glucose-Capillary: 139 mg/dL — ABNORMAL HIGH (ref 65–99)
Glucose-Capillary: 141 mg/dL — ABNORMAL HIGH (ref 65–99)
Glucose-Capillary: 143 mg/dL — ABNORMAL HIGH (ref 65–99)
Glucose-Capillary: 144 mg/dL — ABNORMAL HIGH (ref 65–99)
Glucose-Capillary: 145 mg/dL — ABNORMAL HIGH (ref 65–99)
Glucose-Capillary: 146 mg/dL — ABNORMAL HIGH (ref 65–99)
Glucose-Capillary: 146 mg/dL — ABNORMAL HIGH (ref 65–99)
Glucose-Capillary: 149 mg/dL — ABNORMAL HIGH (ref 65–99)
Glucose-Capillary: 150 mg/dL — ABNORMAL HIGH (ref 65–99)
Glucose-Capillary: 150 mg/dL — ABNORMAL HIGH (ref 65–99)
Glucose-Capillary: 159 mg/dL — ABNORMAL HIGH (ref 65–99)
Glucose-Capillary: 161 mg/dL — ABNORMAL HIGH (ref 65–99)
Glucose-Capillary: 163 mg/dL — ABNORMAL HIGH (ref 65–99)
Glucose-Capillary: 216 mg/dL — ABNORMAL HIGH (ref 65–99)

## 2017-06-14 LAB — BASIC METABOLIC PANEL
Anion gap: 11 (ref 5–15)
Anion gap: 9 (ref 5–15)
Anion gap: 9 (ref 5–15)
BUN: 19 mg/dL (ref 6–20)
BUN: 17 mg/dL (ref 6–20)
BUN: 16 mg/dL (ref 6–20)
CO2: 15 mmol/L — ABNORMAL LOW (ref 22–32)
CO2: 18 mmol/L — ABNORMAL LOW (ref 22–32)
CO2: 17 mmol/L — ABNORMAL LOW (ref 22–32)
Calcium: 7.9 mg/dL — ABNORMAL LOW (ref 8.9–10.3)
Calcium: 8 mg/dL — ABNORMAL LOW (ref 8.9–10.3)
Calcium: 8.1 mg/dL — ABNORMAL LOW (ref 8.9–10.3)
Chloride: 109 mmol/L (ref 101–111)
Chloride: 109 mmol/L (ref 101–111)
Chloride: 108 mmol/L (ref 101–111)
Creatinine, Ser: 0.81 mg/dL (ref 0.44–1.00)
Creatinine, Ser: 0.81 mg/dL (ref 0.44–1.00)
Creatinine, Ser: 0.7 mg/dL (ref 0.44–1.00)
GFR calc Af Amer: >60 mL/min (ref >60)
GFR calc Af Amer: >60 mL/min (ref >60)
GFR calc Af Amer: >60 mL/min (ref >60)
GFR calc non Af Amer: >60 mL/min (ref >60)
GFR calc non Af Amer: >60 mL/min (ref >60)
GFR calc non Af Amer: >60 mL/min (ref >60)
Glucose, Bld: 163 mg/dL — ABNORMAL HIGH (ref 65–99)
Glucose, Bld: 156 mg/dL — ABNORMAL HIGH (ref 65–99)
Glucose, Bld: 164 mg/dL — ABNORMAL HIGH (ref 65–99)
Potassium: 3.5 mmol/L (ref 3.5–5.1)
Potassium: 3.5 mmol/L (ref 3.5–5.1)
Potassium: 3.2 mmol/L — ABNORMAL LOW (ref 3.5–5.1)
Sodium: 135 mmol/L (ref 135–145)
Sodium: 136 mmol/L (ref 135–145)
Sodium: 134 mmol/L — ABNORMAL LOW (ref 135–145)

## 2017-06-14 LAB — CBC
HCT: 37.6 % (ref 35.0–47.0)
Hemoglobin: 13.1 g/dL (ref 12.0–16.0)
MCH: 32.7 pg (ref 26.0–34.0)
MCHC: 34.9 g/dL (ref 32.0–36.0)
MCV: 93.5 fL (ref 80.0–100.0)
Platelets: 189 10*3/uL (ref 150–440)
RBC: 4.02 MIL/uL (ref 3.80–5.20)
RDW: 12.4 % (ref 11.5–14.5)
WBC: 13.6 10*3/uL — ABNORMAL HIGH (ref 3.6–11.0)

## 2017-06-14 LAB — MRSA PCR SCREENING: MRSA by PCR: NEGATIVE

## 2017-06-14 LAB — HIV ANTIBODY (ROUTINE TESTING W REFLEX): HIV Screen 4th Generation wRfx: NONREACTIVE

## 2017-06-14 MED ORDER — POTASSIUM CHLORIDE CRYS ER 20 MEQ PO TBCR
40.0000 meq | EXTENDED_RELEASE_TABLET | Freq: Once | ORAL | Status: AC
  Administered 2017-06-14: 40 meq via ORAL
  Filled 2017-06-14: qty 2

## 2017-06-14 MED ORDER — INSULIN ASPART 100 UNIT/ML ~~LOC~~ SOLN: 0.0000 [IU] | Freq: Every day | SUBCUTANEOUS | Status: DC

## 2017-06-14 MED ORDER — LEVOTHYROXINE SODIUM 175 MCG PO TABS
175.0000 ug | ORAL_TABLET | Freq: Every day | ORAL | Status: DC
  Administered 2017-06-14: 175 ug via ORAL
  Filled 2017-06-14: qty 1

## 2017-06-14 MED ORDER — INSULIN ASPART 100 UNIT/ML ~~LOC~~ SOLN
3.0000 [IU] | Freq: Three times a day (TID) | SUBCUTANEOUS | Status: DC
  Administered 2017-06-14 (×2): 3 [IU] via SUBCUTANEOUS
  Filled 2017-06-14 (×2): qty 1

## 2017-06-14 MED ORDER — INSULIN ASPART 100 UNIT/ML ~~LOC~~ SOLN
0.0000 [IU] | Freq: Three times a day (TID) | SUBCUTANEOUS | Status: DC
  Administered 2017-06-14: 2 [IU] via SUBCUTANEOUS
  Administered 2017-06-14: 5 [IU] via SUBCUTANEOUS
  Filled 2017-06-14 (×2): qty 1

## 2017-06-14 MED ORDER — LEVOTHYROXINE SODIUM 50 MCG PO TABS
175.0000 ug | ORAL_TABLET | Freq: Every day | ORAL | Status: DC
  Filled 2017-06-14 (×2): qty 1.5

## 2017-06-14 MED ORDER — INSULIN GLARGINE 300 UNIT/ML ~~LOC~~ SOPN: 46.0000 [IU] | PEN_INJECTOR | Freq: Every morning | SUBCUTANEOUS | Status: DC

## 2017-06-14 MED ORDER — ALBUTEROL SULFATE (2.5 MG/3ML) 0.083% IN NEBU: 2.5000 mg | INHALATION_SOLUTION | RESPIRATORY_TRACT | Status: DC | PRN

## 2017-06-14 MED ORDER — INSULIN GLARGINE 100 UNIT/ML ~~LOC~~ SOLN
40.0000 [IU] | Freq: Every day | SUBCUTANEOUS | Status: DC
  Administered 2017-06-14: 40 [IU] via SUBCUTANEOUS
  Filled 2017-06-14 (×2): qty 0.4

## 2017-06-14 NOTE — Progress Notes (Signed)
Pt has transitioned off IV insulin 2 hours after Lantus.  Lunch tray provided. 3 units novolog insulin SQ plus 2 units sliding scale insulin coverage given. IVF [place]. Ready for transfer when bed available

## 2017-06-14 NOTE — Progress Notes (Addendum)
Report called to 1A RN.  Pt transfering per w/ch to room# 142. Dr Darvin Neighbours aware of transfer and saw pt earlier today.  Pt is hoping to be discharged to home after evening meal if possible but realizes it may be Sunday. Ate 70% of lunch tray. No N&V. Up in room independently.

## 2017-06-14 NOTE — Progress Notes (Signed)
Patient is being discharged to home this evening. Spoke to Dr. Darvin Neighbours who asked that I put order in for her to be discharged home. DC & RX instructions given and patient acknowledged understanding. Removed IV's and patient called friend to pick her up.

## 2017-06-14 NOTE — Progress Notes (Addendum)
Alert and oriented. VSS. On insulin gtt at 2.6 u/hr. D51/2 NS at168ml/hr. C/o sinus headache. Tylenol given.  Productive tight cough up clear whitish sputum. Initially, exp wheezes noted. Now lung fields diminished throughout. Afebrile.  Denies fever/chills. Discussed Hgb A1c of 12.5 with patient. She sees Dr Rosario Jacks outpatient. BMET results pending. She just started a new job last week and is tearful that she became sick so early in her employment there. She is wanting to return to work on Monday if possible

## 2017-06-14 NOTE — Progress Notes (Signed)
DKA resolved.  I placed transition orders.  After transitioned off of insulin infusion, she may transfer to [place] floor.  After transfer, PCCM will sign off. Please call if we can be of further assistance    Merton Border, MD PCCM service Mobile [phone] Pager [phone] 06/14/2017 1:33 PM

## 2017-06-18 NOTE — Discharge Summary (Signed)
[facility] NAME: Tanya Graves    MR#:  [id number]  DATE OF BIRTH:  1965-08-06  DATE OF ADMISSION:  06/13/2017 ADMITTING PHYSICIAN: Hillary Bow, MD  DATE OF DISCHARGE: 06/14/2017  7:06 PM  PRIMARY CARE PHYSICIAN: Casilda Carls, MD   ADMISSION DIAGNOSIS:  Diabetic ketoacidosis without coma associated with type 2 diabetes mellitus ([place]) [E11.10]  DISCHARGE DIAGNOSIS:  Active Problems:   DKA (diabetic ketoacidoses) ([place])   SECONDARY DIAGNOSIS:   Past Medical History:  Diagnosis Date  . Arthritis   . Diabetes mellitus without complication ([place])   . Thyroid disease      ADMITTING HISTORY  HISTORY OF PRESENT ILLNESS:  Tanya Graves  is a 52 y.o. female with a known history of insulin-dependent diabetes mellitus, hypothyroidism presents to the emergency room with 3 days of vomiting, epigastric pain.  She has had severe cold but seems to be slowly improving.  She has not taken insulin 2 days due to vomiting and unable to eat anything.  Here in the emergency room she has been found to have severe DKA with blood sugars greater than 600 bicarb less than 7 and increased anion gap.  Patient is being admitted being critically ill to the ICU.     HOSPITAL COURSE:   *DKA *Acute kidney injury *Severe dehydration Patient was treated with DKA protocol. IV insulin, aggressive fluid resuscitation.  Serial labs.  Patient slowly improved and DKA resolved.  At this point she was transitioned to subcutaneous Lantus and sliding scale.  Her vomiting and nausea resolved.  She is tolerating food.  Requesting to go home.  Patient is being discharged to follow-up with her primary care physician in stable condition after DKA resolved and patient tolerating diet on Lantus.  CONSULTS OBTAINED:  Treatment Team:  Wilhelmina Mcardle, MD  DRUG ALLERGIES:   Allergies  Allergen Reactions  . Corticosteroids Other (See Comments)    Elevate blood sugar     DISCHARGE MEDICATIONS:   Allergies as of 06/14/2017      Reactions   Corticosteroids Other (See Comments)   Elevate blood sugar      Medication List    TAKE these medications   albuterol 108 (90 Base) MCG/ACT inhaler Commonly known as:  PROVENTIL HFA;VENTOLIN HFA Inhale 1-2 puffs into the lungs every 6 (six) hours as needed for wheezing or shortness of breath.   cetirizine 10 MG tablet Commonly known as:  ZYRTEC Take 10 mg by mouth daily.   HUMALOG KWIKPEN 100 UNIT/ML KiwkPen Generic drug:  insulin lispro Inject 15 Units into the skin 3 (three) times daily.   Insulin Glargine 300 UNIT/ML Sopn Commonly known as:  TOUJEO SOLOSTAR Inject 46 Units into the skin every morning. What changed:    medication strength  how much to take   levothyroxine 175 MCG tablet Commonly known as:  SYNTHROID, LEVOTHROID Take 1 tablet by mouth daily before breakfast.   metFORMIN 500 MG tablet Commonly known as:  GLUCOPHAGE Take 1,000 mg by mouth 2 (two) times daily with a meal.   Vitamin D (Ergocalciferol) 50000 units Caps capsule Commonly known as:  DRISDOL Take 50,000 Units by mouth every 7 (seven) days. Every Sunday       Today   VITAL SIGNS:  Blood pressure 106/65, pulse 94, temperature 98.6 F (37 C), temperature source Oral, resp. rate 19, height 5\' 8"  (1.727 m), weight 113.5 kg (250 lb 3.6 oz), last menstrual period 06/28/2015, SpO2 97 %.  I/O:  No intake or output data in the 24 hours ending 06/18/17 1542  PHYSICAL EXAMINATION:  Physical Exam  GENERAL:  52 y.o.-year-old patient lying in the bed with no acute distress.  LUNGS: Normal breath sounds bilaterally, no wheezing, rales,rhonchi or crepitation. No use of accessory muscles of respiration.  CARDIOVASCULAR: S1, S2 normal. No murmurs, rubs, or gallops.  ABDOMEN: Soft, non-tender, non-distended. Bowel sounds present. No organomegaly or mass.  NEUROLOGIC: Moves all 4 extremities. PSYCHIATRIC: The patient is alert  and oriented x 3.  SKIN: No obvious rash, lesion, or ulcer.   DATA REVIEW:   CBC Recent Labs  Lab 06/14/17 0431  WBC 13.6*  HGB 13.1  HCT 37.6  PLT 189    Chemistries  Recent Labs  Lab 06/13/17 0915  06/14/17 0906  NA 130*   < > 134*  K 5.2*   < > 3.2*  CL 92*   < > 108  CO2 <7*   < > 17*  GLUCOSE 751*   < > 164*  BUN 22*   < > 16  CREATININE 1.57*   < > 0.70  CALCIUM 8.9   < > 8.1*  AST 22  --   --   ALT 14  --   --   ALKPHOS 119  --   --   BILITOT 1.8*  --   --    < > = values in this interval not displayed.    Cardiac Enzymes Recent Labs  Lab 06/13/17 0915  TROPONINI <0.03    Microbiology Results  Results for orders placed or performed during the hospital encounter of 06/13/17  MRSA PCR Screening     Status: None   Collection Time: 06/14/17  6:38 AM  Result Value Ref Range Status   MRSA by PCR NEGATIVE NEGATIVE Final    Comment:        The GeneXpert MRSA Assay (FDA approved for NASAL specimens only), is one component of a comprehensive MRSA colonization surveillance program. It is not intended to diagnose MRSA infection nor to guide or monitor treatment for MRSA infections. Performed at [facility], [address] [place],  [address]     RADIOLOGY:  No results found.  Follow up with PCP in 1 week.  Management plans discussed with the patient, family and they are in agreement.  CODE STATUS:  Code Status History    Date Active Date Inactive Code Status Order ID Comments User Context   06/13/2017 11:47 06/14/2017 22:12 Full Code [id number]  Hillary Bow, MD ED   01/22/2015 22:57 01/25/2015 18:56 Full Code [id number]  Hower, Aaron Mose, MD ED      TOTAL TIME TAKING CARE OF THIS PATIENT ON DAY OF DISCHARGE: more than 30 minutes.   Neita Carp M.D on 06/18/2017 at 3:42 PM  Between 7am to 6pm - Pager - [phone]  After 6pm go to [email or web address] - password [email or web address] [facility]  Office   [phone]  CC: Primary care physician; Casilda Carls, MD  Note: This dictation was prepared with Dragon dictation along with smaller phrase technology. Any transcriptional errors that result from this process are unintentional.

## 2017-06-23 LAB — BLOOD GAS, VENOUS
Patient temperature: 37
pCO2, Ven: 23 mmHg — ABNORMAL LOW (ref 44.0–60.0)
pH, Ven: <6.9 — CL (ref 7.250–7.430)
pO2, Ven: 51 mmHg — ABNORMAL HIGH (ref 32.0–45.0)

## 2018-03-17 LAB — LIPID PANEL
Cholesterol: 232 — AB (ref 0–200)
HDL: 66 (ref 35–70)
LDL Cholesterol: 139
Triglycerides: 142 (ref 40–160)

## 2018-03-17 LAB — TSH: TSH: 2.7 (ref 0.41–5.90)

## 2018-05-29 ENCOUNTER — Inpatient Hospital Stay
Admission: EM | Admit: 2018-05-29 | Discharge: 2018-06-01 | DRG: 639 | Disposition: A | Discharge status: Home / Self Care | Payer: BLUE CROSS/BLUE SHIELD | Attending: Internal Medicine | Admitting: Internal Medicine

## 2018-05-29 ENCOUNTER — Emergency Department: Payer: BLUE CROSS/BLUE SHIELD

## 2018-05-29 DIAGNOSIS — E875 Hyperkalemia: Secondary | ICD-10-CM | POA: Diagnosis present

## 2018-05-29 DIAGNOSIS — E039 Hypothyroidism, unspecified: Secondary | ICD-10-CM | POA: Diagnosis present

## 2018-05-29 DIAGNOSIS — E785 Hyperlipidemia, unspecified: Secondary | ICD-10-CM | POA: Diagnosis present

## 2018-05-29 DIAGNOSIS — E1310 Other specified diabetes mellitus with ketoacidosis without coma: Secondary | ICD-10-CM

## 2018-05-29 DIAGNOSIS — Z794 Long term (current) use of insulin: Secondary | ICD-10-CM

## 2018-05-29 DIAGNOSIS — E1165 Type 2 diabetes mellitus with hyperglycemia: Secondary | ICD-10-CM

## 2018-05-29 DIAGNOSIS — E876 Hypokalemia: Secondary | ICD-10-CM | POA: Diagnosis not present

## 2018-05-29 DIAGNOSIS — Z9114 Patient's other noncompliance with medication regimen: Secondary | ICD-10-CM

## 2018-05-29 DIAGNOSIS — E872 Acidosis, unspecified: Secondary | ICD-10-CM

## 2018-05-29 DIAGNOSIS — Z9119 Patient's noncompliance with other medical treatment and regimen: Secondary | ICD-10-CM | POA: Diagnosis not present

## 2018-05-29 DIAGNOSIS — E1110 Type 2 diabetes mellitus with ketoacidosis without coma: Principal | ICD-10-CM | POA: Diagnosis present

## 2018-05-29 DIAGNOSIS — F17210 Nicotine dependence, cigarettes, uncomplicated: Secondary | ICD-10-CM | POA: Diagnosis present

## 2018-05-29 DIAGNOSIS — A419 Sepsis, unspecified organism: Secondary | ICD-10-CM | POA: Diagnosis not present

## 2018-05-29 DIAGNOSIS — M1990 Unspecified osteoarthritis, unspecified site: Secondary | ICD-10-CM | POA: Diagnosis present

## 2018-05-29 DIAGNOSIS — K219 Gastro-esophageal reflux disease without esophagitis: Secondary | ICD-10-CM | POA: Diagnosis present

## 2018-05-29 DIAGNOSIS — Z833 Family history of diabetes mellitus: Secondary | ICD-10-CM

## 2018-05-29 DIAGNOSIS — Z888 Allergy status to other drugs, medicaments and biological substances status: Secondary | ICD-10-CM

## 2018-05-29 DIAGNOSIS — Z79899 Other long term (current) drug therapy: Secondary | ICD-10-CM | POA: Diagnosis not present

## 2018-05-29 DIAGNOSIS — E860 Dehydration: Secondary | ICD-10-CM | POA: Diagnosis present

## 2018-05-29 DIAGNOSIS — R112 Nausea with vomiting, unspecified: Secondary | ICD-10-CM | POA: Diagnosis present

## 2018-05-29 DIAGNOSIS — B379 Candidiasis, unspecified: Secondary | ICD-10-CM | POA: Diagnosis not present

## 2018-05-29 DIAGNOSIS — Z79890 Hormone replacement therapy (postmenopausal): Secondary | ICD-10-CM | POA: Diagnosis not present

## 2018-05-29 DIAGNOSIS — D72829 Elevated white blood cell count, unspecified: Secondary | ICD-10-CM | POA: Diagnosis present

## 2018-05-29 LAB — BLOOD GAS, VENOUS
Acid-base deficit: 26.5 mmol/L — ABNORMAL HIGH (ref 0.0–2.0)
Bicarbonate: 3.7 mmol/L — ABNORMAL LOW (ref 20.0–28.0)
O2 Saturation: 62.4 %
Patient temperature: 37
pCO2, Ven: <19 mmHg — CL (ref 44.0–60.0)
pH, Ven: 6.97 — CL (ref 7.250–7.430)
pO2, Ven: 53 mmHg — ABNORMAL HIGH (ref 32.0–45.0)

## 2018-05-29 LAB — CBC
HCT: 49.6 % — ABNORMAL HIGH (ref 36.0–46.0)
Hemoglobin: 16 g/dL — ABNORMAL HIGH (ref 12.0–15.0)
MCH: 32.3 pg (ref 26.0–34.0)
MCHC: 32.3 g/dL (ref 30.0–36.0)
MCV: 100 fL (ref 80.0–100.0)
Platelets: 376 10*3/uL (ref 150–400)
RBC: 4.96 MIL/uL (ref 3.87–5.11)
RDW: 12 % (ref 11.5–15.5)
WBC: 25.2 10*3/uL — ABNORMAL HIGH (ref 4.0–10.5)
nRBC: 0 % (ref 0.0–0.2)

## 2018-05-29 LAB — COMPREHENSIVE METABOLIC PANEL
ALT: 17 U/L (ref 0–44)
AST: 21 U/L (ref 15–41)
Albumin: 4.8 g/dL (ref 3.5–5.0)
Alkaline Phosphatase: 120 U/L (ref 38–126)
Anion gap: UNDETERMINED (ref 5–15)
BUN: 22 mg/dL — ABNORMAL HIGH (ref 6–20)
CO2: <7 mmol/L — ABNORMAL LOW (ref 22–32)
Calcium: 8.5 mg/dL — ABNORMAL LOW (ref 8.9–10.3)
Chloride: 96 mmol/L — ABNORMAL LOW (ref 98–111)
Creatinine, Ser: 1.62 mg/dL — ABNORMAL HIGH (ref 0.44–1.00)
GFR calc Af Amer: 42 mL/min — ABNORMAL LOW (ref >60)
GFR calc non Af Amer: 36 mL/min — ABNORMAL LOW (ref >60)
Glucose, Bld: 782 mg/dL (ref 70–99)
Potassium: 4.8 mmol/L (ref 3.5–5.1)
Sodium: 133 mmol/L — ABNORMAL LOW (ref 135–145)
Total Bilirubin: 1.2 mg/dL (ref 0.3–1.2)
Total Protein: 7.9 g/dL (ref 6.5–8.1)

## 2018-05-29 LAB — POCT I-STAT, CHEM 8
BUN: 22 mg/dL — ABNORMAL HIGH (ref 6–20)
Calcium, Ion: 1.11 mmol/L — ABNORMAL LOW (ref 1.15–1.40)
Chloride: 101 mmol/L (ref 98–111)
Creatinine, Ser: 1.1 mg/dL — ABNORMAL HIGH (ref 0.44–1.00)
Glucose, Bld: >700 mg/dL (ref 70–99)
HCT: 49 % — ABNORMAL HIGH (ref 36.0–46.0)
Hemoglobin: 16.7 g/dL — ABNORMAL HIGH (ref 12.0–15.0)
Potassium: 5.4 mmol/L — ABNORMAL HIGH (ref 3.5–5.1)
Sodium: 132 mmol/L — ABNORMAL LOW (ref 135–145)
TCO2: 6 mmol/L — ABNORMAL LOW (ref 22–32)

## 2018-05-29 LAB — LIPASE, BLOOD: Lipase: 30 U/L (ref 11–51)

## 2018-05-29 LAB — CG4 I-STAT (LACTIC ACID)
Lactic Acid, Venous: 5.68 mmol/L (ref 0.5–1.9)
Lactic Acid, Venous: 5.04 mmol/L (ref 0.5–1.9)

## 2018-05-29 LAB — GLUCOSE, CAPILLARY
Glucose-Capillary: >600 mg/dL (ref 70–99)
Glucose-Capillary: >600 mg/dL (ref 70–99)
Glucose-Capillary: 585 mg/dL (ref 70–99)

## 2018-05-29 LAB — BETA-HYDROXYBUTYRIC ACID: Beta-Hydroxybutyric Acid: >8 mmol/L — ABNORMAL HIGH (ref 0.05–0.27)

## 2018-05-29 MED ORDER — INSULIN REGULAR(HUMAN) IN NACL 100-0.9 UT/100ML-% IV SOLN: INTRAVENOUS | Status: DC

## 2018-05-29 MED ORDER — LEVOTHYROXINE SODIUM 175 MCG PO TABS
175.0000 ug | ORAL_TABLET | Freq: Every day | ORAL | Status: DC
  Administered 2018-05-30 – 2018-06-01 (×3): 175 ug via ORAL
  Filled 2018-05-29 (×3): qty 1

## 2018-05-29 MED ORDER — SODIUM CHLORIDE 0.9 % IV SOLN: INTRAVENOUS | Status: AC

## 2018-05-29 MED ORDER — ONDANSETRON HCL 4 MG PO TABS: 4.0000 mg | ORAL_TABLET | Freq: Four times a day (QID) | ORAL | Status: DC | PRN

## 2018-05-29 MED ORDER — LORATADINE 10 MG PO TABS
10.0000 mg | ORAL_TABLET | Freq: Every day | ORAL | Status: DC
  Administered 2018-05-30 – 2018-06-01 (×3): 10 mg via ORAL
  Filled 2018-05-29 (×3): qty 1

## 2018-05-29 MED ORDER — INSULIN REGULAR(HUMAN) IN NACL 100-0.9 UT/100ML-% IV SOLN
INTRAVENOUS | Status: DC
  Administered 2018-05-29: 5.4 [IU]/h via INTRAVENOUS
  Administered 2018-05-30: 4 [IU]/h via INTRAVENOUS
  Administered 2018-05-30: 16.8 [IU]/h via INTRAVENOUS
  Filled 2018-05-29 (×3): qty 100

## 2018-05-29 MED ORDER — DEXTROSE-NACL 5-0.45 % IV SOLN
INTRAVENOUS | Status: DC
  Administered 2018-05-30 – 2018-05-31 (×3): via INTRAVENOUS

## 2018-05-29 MED ORDER — ONDANSETRON HCL 4 MG/2ML IJ SOLN
4.0000 mg | Freq: Once | INTRAMUSCULAR | Status: AC
  Administered 2018-05-29: 4 mg via INTRAVENOUS
  Filled 2018-05-29: qty 2

## 2018-05-29 MED ORDER — ENOXAPARIN SODIUM 40 MG/0.4ML ~~LOC~~ SOLN
40.0000 mg | SUBCUTANEOUS | Status: DC
  Administered 2018-05-30: 40 mg via SUBCUTANEOUS
  Filled 2018-05-29: qty 0.4

## 2018-05-29 MED ORDER — ATORVASTATIN CALCIUM 20 MG PO TABS
10.0000 mg | ORAL_TABLET | Freq: Every day | ORAL | Status: DC
  Administered 2018-05-30 – 2018-06-01 (×3): 10 mg via ORAL
  Filled 2018-05-29 (×3): qty 1

## 2018-05-29 MED ORDER — SODIUM CHLORIDE 0.9 % IV BOLUS
1000.0000 mL | Freq: Once | INTRAVENOUS | Status: AC
  Administered 2018-05-29: 1000 mL via INTRAVENOUS

## 2018-05-29 MED ORDER — ALBUTEROL SULFATE (2.5 MG/3ML) 0.083% IN NEBU: 2.5000 mg | INHALATION_SOLUTION | Freq: Four times a day (QID) | RESPIRATORY_TRACT | Status: DC | PRN

## 2018-05-29 MED ORDER — FENTANYL CITRATE (PF) 100 MCG/2ML IJ SOLN
50.0000 ug | Freq: Once | INTRAMUSCULAR | Status: AC
  Administered 2018-05-29: 50 ug via INTRAVENOUS

## 2018-05-29 MED ORDER — ONDANSETRON HCL 4 MG/2ML IJ SOLN
4.0000 mg | Freq: Four times a day (QID) | INTRAMUSCULAR | Status: DC | PRN
  Administered 2018-05-30 – 2018-06-01 (×5): 4 mg via INTRAVENOUS
  Filled 2018-05-29 (×5): qty 2

## 2018-05-29 MED ORDER — ONDANSETRON HCL 4 MG/2ML IJ SOLN
4.0000 mg | Freq: Once | INTRAMUSCULAR | Status: AC
  Administered 2018-05-29: 4 mg via INTRAVENOUS

## 2018-05-29 MED ORDER — FENTANYL CITRATE (PF) 100 MCG/2ML IJ SOLN
50.0000 ug | Freq: Once | INTRAMUSCULAR | Status: AC
  Administered 2018-05-29: 50 ug via INTRAVENOUS
  Filled 2018-05-29: qty 2

## 2018-05-29 MED ORDER — SODIUM CHLORIDE 0.9 % IV SOLN
2.0000 g | Freq: Once | INTRAVENOUS | Status: AC
  Administered 2018-05-29: 2 g via INTRAVENOUS
  Filled 2018-05-29: qty 2

## 2018-05-29 MED ORDER — SODIUM CHLORIDE 0.9 % IV SOLN
INTRAVENOUS | Status: DC
  Administered 2018-05-29: via INTRAVENOUS

## 2018-05-29 MED ORDER — HYDROMORPHONE HCL 1 MG/ML IJ SOLN
0.5000 mg | INTRAMUSCULAR | Status: AC
  Administered 2018-05-29: 0.5 mg via INTRAVENOUS
  Filled 2018-05-29: qty 1

## 2018-05-29 MED ORDER — DEXTROSE-NACL 5-0.45 % IV SOLN: INTRAVENOUS | Status: DC

## 2018-05-29 NOTE — ED Triage Notes (Signed)
Patient coming [facility] for back pain, abdominal pain, N/V. Patient vomiting on arrival. patient with hx of DM.   EMS vitals: HR 130, BP 188/110, CBG 372  Patient given 2mg  Zofran in transit.

## 2018-05-29 NOTE — ED Provider Notes (Signed)
[facility] Emergency Department Provider Note   ____________________________________________   First MD Initiated Contact with Patient 05/29/18 2113     (approximate)  I have reviewed the triage vital signs and the nursing notes.   HISTORY  Chief Complaint Emesis  EM caveat: Limited due to severe presentation and acuity, patient appears severely ill toxic  HPI Tanya Graves is a 53 y.o. female here for evaluation of severe vomiting and high glucose  Patient reports that about a week ago she had a boil over her buttock that burst open, that seem to get better.  But she does relate that she had a similar episode about a year ago and she began to have high blood sugar today, her blood glucometer read high and she developed nausea and severe vomiting and reports she does not know why she is breathing so hard starting this afternoon  She is vomited multiple times.  No chest pain.  Does not feel short of breath but feels like she cannot catch her breath.  No fever.  No skin rash except for the boil that popped and seemed to get better a week ago.  Was located around her buttock.  No chest pain.  Intractable vomiting.  Did not use her insulin today because she was not eating   Past Medical History:  Diagnosis Date  . Arthritis   . Diabetes mellitus without complication ([place])   . Thyroid disease     Patient Active Problem List   Diagnosis Date Noted  . DKA (diabetic ketoacidoses) ([place]) 06/13/2017  . Abscess of right thigh   . Cellulitis of right thigh   . Hyperglycemia   . Sepsis affecting skin 01/22/2015  . Poorly controlled type 2 diabetes mellitus ([place]) 01/22/2015    Past Surgical History:  Procedure Laterality Date  . APPENDECTOMY  1993  . CALCANEAL OSTEOTOMY Left 07/17/2016   Procedure: CALCANEAL OSTEOTOMY;  Surgeon: [name], MD;  Location: [facility] ORS;  Service: Orthopedics;  Laterality: Left;  . IRRIGATION AND DEBRIDEMENT ABSCESS N/A  01/24/2015   Procedure: IRRIGATION AND DEBRIDEMENT ABSCESS/GROIN ABSCESS;  Surgeon: [name], MD;  Location: [facility] ORS;  Service: General;  Laterality: N/A;  . KNEE ARTHROSCOPY W/ MENISCAL REPAIR Right 2008  . ROTATOR CUFF REPAIR Right 2015  . TONSILLECTOMY  1998    Prior to Admission medications   Medication Sig Start Date End Date Taking? Authorizing Provider  atorvastatin (LIPITOR) 10 MG tablet Take 10 mg by mouth daily.   Yes [provider]  insulin aspart (NOVOLOG) 100 UNIT/ML injection Inject 15 Units into the skin 3 (three) times daily before meals. sliding scale   Yes [provider]  Insulin Glargine (TOUJEO SOLOSTAR) 300 UNIT/ML SOPN Inject 46 Units into the skin every morning. 06/14/17  Yes Hillary Bow, MD  levothyroxine (SYNTHROID, LEVOTHROID) 175 MCG tablet Take 1 tablet by mouth daily before breakfast.  06/08/16  Yes [provider]  metFORMIN (GLUCOPHAGE) 500 MG tablet Take 1,000 mg by mouth 2 (two) times daily with a meal.    Yes [provider]  albuterol (PROVENTIL HFA;VENTOLIN HFA) 108 (90 Base) MCG/ACT inhaler Inhale 1-2 puffs into the lungs every 6 (six) hours as needed for wheezing or shortness of breath.    [provider]  cetirizine (ZYRTEC) 10 MG tablet Take 10 mg by mouth daily.    [provider]  HUMALOG KWIKPEN 100 UNIT/ML KiwkPen Inject 15 Units into the skin 3 (three) times daily.  06/06/17  [provider]  Vitamin D, Ergocalciferol, (DRISDOL) 50000 units CAPS capsule Take 50,000 Units by mouth every 7 (seven) days. Every Sunday    [provider]    Allergies Corticosteroids  Family History  Problem Relation Age of Onset  . Diabetes Other   . Diabetes Father   . Heart failure Father   . Diabetes Brother   . Breast cancer Neg Hx     Social History Social History   Tobacco Use  . Smoking status: Current Some Day Smoker    Packs/day: 0.25    Types: Cigarettes  .  Smokeless tobacco: Never Used  Substance Use Topics  . Alcohol use: Yes    Comment: Occasional  . Drug use: No    Review of Systems Constitutional: No fever/chills but very fatigued and weak feeling Eyes: No visual changes. ENT: No sore throat. Cardiovascular: Denies chest pain. Respiratory: Denies shortness of breath.  See HPI.  Feels like she just cannot catch her breath. Gastrointestinal: No abdominal pain.   Genitourinary: Negative for dysuria. Musculoskeletal: Negative for back pain. Skin: Negative for rash except for a boil on the buttock that popped a week ago and seemed to get better. Neurological: Negative for headaches, areas of focal weakness or numbness.    ____________________________________________   PHYSICAL EXAM:  VITAL SIGNS: ED Triage Vitals  Enc Vitals Group     BP 05/29/18 1938 (!) 152/91     Pulse Rate 05/29/18 1937 (!) 130     Resp 05/29/18 1937 (!) 33     Temp 05/29/18 1940 (!) 96.5 F (35.8 C)     Temp Source 05/29/18 1940 Axillary     SpO2 05/29/18 1937 100 %     Weight 05/29/18 1935 251 lb 5.2 oz (114 kg)     Height --      Head Circumference --      Peak Flow --      Pain Score 05/29/18 1935 9     Pain Loc --      Pain Edu? --      Excl. in [place]? --     Constitutional: Alert and oriented.  Extremely ill-appearing, coo small breathing, cool to the touch, appears cyanotic in her distal nailbeds. Eyes: Conjunctivae are normal. Head: Atraumatic. Nose: No congestion/rhinnorhea. Mouth/Throat: Mucous membranes are strongly dry. Neck: No stridor.  Cardiovascular: Normal rate, regular rhythm. Grossly normal heart sounds.  Good peripheral circulation. Respiratory: Normal respiratory effort.  No retractions. Lungs CTAB. Gastrointestinal: Soft and nontender. No distention.  Intermittently having retching. Musculoskeletal: No lower extremity tenderness nor edema. Neurologic:  Normal speech and language. No gross focal neurologic deficits are  appreciated.  Skin:  Skin is warm, dry and intact. No rash noted.  Examined the buttock, skin folds around the buttock in the area where she reports she had a boil over the left gluteal fissure and at this time I do not see any swelling erythema warmth or evidence of abscess. Psychiatric: Mood and affect are normal. Speech and behavior are normal.  ____________________________________________   LABS (all labs ordered are listed, but only abnormal results are displayed)  Labs Reviewed  CBC - Abnormal; Notable for the following components:      Result Value   WBC 25.2 (*)    Hemoglobin 16.0 (*)    HCT 49.6 (*)    All other components within normal limits  BLOOD GAS, VENOUS - Abnormal; Notable for the following components:   pH, Ven 6.97 (*)  pCO2, Ven <19.0 (*)    pO2, Ven 53.0 (*)    Bicarbonate 3.7 (*)    Acid-base deficit 26.5 (*)    All other components within normal limits  COMPREHENSIVE METABOLIC PANEL - Abnormal; Notable for the following components:   Sodium 133 (*)    Chloride 96 (*)    CO2 <7 (*)    Glucose, Bld 782 (*)    BUN 22 (*)    Creatinine, Ser 1.62 (*)    Calcium 8.5 (*)    GFR calc non Af Amer 36 (*)    GFR calc Af Amer 42 (*)    All other components within normal limits  GLUCOSE, CAPILLARY - Abnormal; Notable for the following components:   Glucose-Capillary >600 (*)    All other components within normal limits  BETA-HYDROXYBUTYRIC ACID - Abnormal; Notable for the following components:   Beta-Hydroxybutyric Acid >8.00 (*)    All other components within normal limits  GLUCOSE, CAPILLARY - Abnormal; Notable for the following components:   Glucose-Capillary >600 (*)    All other components within normal limits  GLUCOSE, CAPILLARY - Abnormal; Notable for the following components:   Glucose-Capillary >600 (*)    All other components within normal limits  CG4 I-STAT (LACTIC ACID) - Abnormal; Notable for the following components:   Lactic Acid, Venous 5.68  (*)    All other components within normal limits  POCT I-STAT, CHEM 8 - Abnormal; Notable for the following components:   Sodium 132 (*)    Potassium 5.4 (*)    BUN 22 (*)    Creatinine, Ser 1.10 (*)    Glucose, Bld >700 (*)    Calcium, Ion 1.11 (*)    TCO2 6 (*)    Hemoglobin 16.7 (*)    HCT 49.0 (*)    All other components within normal limits  CULTURE, BLOOD (ROUTINE X 2)  CULTURE, BLOOD (ROUTINE X 2)  LIPASE, BLOOD  URINALYSIS, COMPLETE (UACMP) WITH MICROSCOPIC  I-STAT CG4 LACTIC ACID, ED  I-STAT CHEM 8, ED  I-STAT CG4 LACTIC ACID, ED   ____________________________________________  EKG  Reviewed interval at 1940 Heart rate 130 QRS 110 QTc 440 Sinus tachycardia, no evidence acute ischemia ____________________________________________  RADIOLOGY  Dg Chest Port 1 View  Result Date: 05/29/2018 CLINICAL DATA:  Dyspnea EXAM: PORTABLE CHEST 1 VIEW COMPARISON:  06/13/2017 FINDINGS: The heart size and mediastinal contours are within normal limits. Both lungs are clear. The visualized skeletal structures are unremarkable. IMPRESSION: No active disease. Electronically Signed   By: Donavan Foil M.D.   On: 05/29/2018 20:01    Chest x-ray reviewed negative ____________________________________________   PROCEDURES  Procedure(s) performed: None  Procedures  Critical Care performed: Yes, see critical care note(s)  CRITICAL CARE Performed by: Delman Kitten   Total critical care time: 65 minutes  Critical care time was exclusive of separately billable procedures and treating other patients.  Critical care was necessary to treat or prevent imminent or life-threatening deterioration.  Critical care was time spent personally by me on the following activities: development of treatment plan with patient and/or surrogate as well as nursing, discussions with consultants, evaluation of patient's response to treatment, examination of patient, obtaining history from patient or  surrogate, ordering and performing treatments and interventions, ordering and review of laboratory studies, ordering and review of radiographic studies, pulse oximetry and re-evaluation of patient's condition.  ____________________________________________   INITIAL IMPRESSION / ASSESSMENT AND PLAN / ED COURSE  Pertinent labs & imaging results that  were available during my care of the patient were reviewed by me and considered in my medical decision making (see chart for details).       Patient reevaluated multiple times for concerns of severe DKA, severe acidosis, possible sepsis in association with a boil that she had a week ago, possibly a source for seeding.  She does appear toxic and acutely ill.  pH is critically low.  Initiate bolus fluid resuscitation, pain control for which she describes as back discomfort and muscle soreness.  No evidence of acute pulmonary source.  Tachycardia likely secondary to severe sepsis and DKA  ----------------------------------------- 10:44 PM on 05/29/2018 -----------------------------------------  Patient reevaluated, beginning to have nausea once again.  Remains tachycardic, fully alert, breathing appears to be improving, she appears much more comfortable than on presentation and far less toxic.  Nailbeds no longer appearing cyanotic.  Admitting to the ICU.  Also because of the concern of a boil that she noted, though I do not see any obvious abscess on examination I did discuss with Dr. Celine Ahr of surgery and surgery will provide consultation to evaluate and assure there is no sign or symptom of peri-gluteal or cleft type abscess given elevated concern for sepsis or infection as cause for today's hypoglycmia.   ____________________________________________   FINAL CLINICAL IMPRESSION(S) / ED DIAGNOSES  Final diagnoses:  Diabetic ketoacidosis without coma associated with other specified diabetes mellitus ([place])  Sepsis, due to unspecified organism,  unspecified whether acute organ dysfunction present ([place])  Acidosis, lactic        Note:  This document was prepared using Dragon voice recognition software and may include unintentional dictation errors       Delman Kitten, MD 05/29/18 2246

## 2018-05-29 NOTE — ED Notes (Signed)
ED TO INPATIENT HANDOFF REPORT  Name/Age/Gender [name] 53 y.o. female  Code Status Code Status History    Date Active Date Inactive Code Status Order ID Comments User Context   06/13/2017 1147 06/14/2017 2212 Full Code [id number]  Hillary Bow, MD ED   01/22/2015 2257 01/25/2015 1856 Full Code [id number]  Hower, Aaron Mose, MD ED      Home/SNF/Other Home  Chief Complaint nausea and vomiting  Level of Care/Admitting Diagnosis ED Disposition    ED Disposition Condition [place]: [place] [100120]  Level of Care: Stepdown [14]  Diagnosis: DKA (diabetic ketoacidoses) [facility]) [182993]  Admitting Physician: Gorden Harms [[id number]]  Attending Physician: Gorden Harms [[id number]]  Estimated length of stay: past midnight tomorrow  Certification:: I certify this patient will need inpatient services for at least 2 midnights  PT Class (Do Not Modify): Inpatient [101]  PT Acc Code (Do Not Modify): Private [1]       Medical History Past Medical History:  Diagnosis Date  . Arthritis   . Diabetes mellitus without complication ([place])   . Thyroid disease     Allergies Allergies  Allergen Reactions  . Corticosteroids Other (See Comments)    Elevate blood sugar    IV Location/Drains/Wounds Patient Lines/Drains/Airways Status   Active Line/Drains/Airways    Name:   Placement date:   Placement time:   Site:   Days:   Peripheral IV 05/29/18 Left Other (Comment)   05/29/18    1953    Other (Comment)   less than 1   Peripheral IV 05/29/18 Left Antecubital   05/29/18    2031    Antecubital   less than 1   Peripheral IV 05/29/18 Right Arm   05/29/18    2119    Arm   less than 1          Labs/Imaging Results for orders placed or performed during the hospital encounter of 05/29/18 (from the past 48 hour(s))  Blood gas, venous     Status: Abnormal   Collection Time: 05/29/18  7:37 PM  Result Value Ref Range   pH, Ven 6.97 (LL)  7.250 - 7.430    Comment: CRITICAL RESULT CALLED TO, READ BACK BY AND VERIFIED WITH: DR Jacqualine Code [id number] 2039 DT    pCO2, Ven <19.0 (LL) 44.0 - 60.0 mmHg    Comment: CRITICAL RESULT CALLED TO, READ BACK BY AND VERIFIED WITH: DR Jacqualine Code [id number] 2039 DT    pO2, Ven 53.0 (H) 32.0 - 45.0 mmHg   Bicarbonate 3.7 (L) 20.0 - 28.0 mmol/L   Acid-base deficit 26.5 (H) 0.0 - 2.0 mmol/L   O2 Saturation 62.4 %   Patient temperature 37.0    Collection site LINE    Sample type VENOUS     Comment: Performed at [facility], [address] [place], [place] [address]  Glucose, capillary     Status: Abnormal   Collection Time: 05/29/18  7:38 PM  Result Value Ref Range   Glucose-Capillary >600 (HH) 70 - 99 mg/dL  Lipase, blood     Status: None   Collection Time: 05/29/18  7:48 PM  Result Value Ref Range   Lipase 30 11 - 51 U/L    Comment: Performed at [facility], [address] [place], [place] [address]  CBC     Status: Abnormal   Collection Time: 05/29/18  7:48 PM  Result Value Ref Range   WBC 25.2 (  H) 4.0 - 10.5 K/uL   RBC 4.96 3.87 - 5.11 MIL/uL   Hemoglobin 16.0 (H) 12.0 - 15.0 g/dL   HCT 49.6 (H) 36.0 - 46.0 %   MCV 100.0 80.0 - 100.0 fL   MCH 32.3 26.0 - 34.0 pg   MCHC 32.3 30.0 - 36.0 g/dL   RDW 12.0 11.5 - 15.5 %   Platelets 376 150 - 400 K/uL   nRBC 0.0 0.0 - 0.2 %    Comment: Performed at [facility], [address] [place], [place] [address]  Comprehensive metabolic panel     Status: Abnormal   Collection Time: 05/29/18  7:48 PM  Result Value Ref Range   Sodium 133 (L) 135 - 145 mmol/L   Potassium 4.8 3.5 - 5.1 mmol/L   Chloride 96 (L) 98 - 111 mmol/L   CO2 <7 (L) 22 - 32 mmol/L   Glucose, Bld 782 (HH) 70 - 99 mg/dL    Comment: CRITICAL RESULT CALLED TO, READ BACK BY AND VERIFIED WITH KELLY GIBSON 05/29/18 @ 2045  MLK    BUN 22 (H) 6 - 20 mg/dL   Creatinine, Ser 1.62 (H) 0.44 - 1.00 mg/dL   Calcium 8.5 (L) 8.9 - 10.3 mg/dL   Total Protein 7.9  6.5 - 8.1 g/dL   Albumin 4.8 3.5 - 5.0 g/dL   AST 21 15 - 41 U/L   ALT 17 0 - 44 U/L   Alkaline Phosphatase 120 38 - 126 U/L   Total Bilirubin 1.2 0.3 - 1.2 mg/dL   GFR calc non Af Amer 36 (L) >60 mL/min   GFR calc Af Amer 42 (L) >60 mL/min   Anion gap UNABLE TO CALCULATE DUE TO NON NUMERIC VALUE [place] 5 - 15    Comment: Performed at [facility], [address] [place], [place] [address]  CG4 I-STAT (Lactic acid)     Status: Abnormal   Collection Time: 05/29/18  8:03 PM  Result Value Ref Range   Lactic Acid, Venous 5.68 (HH) 0.5 - 1.9 mmol/L   Comment NOTIFIED PHYSICIAN   I-STAT, chem 8     Status: Abnormal   Collection Time: 05/29/18  8:41 PM  Result Value Ref Range   Sodium 132 (L) 135 - 145 mmol/L   Potassium 5.4 (H) 3.5 - 5.1 mmol/L   Chloride 101 98 - 111 mmol/L   BUN 22 (H) 6 - 20 mg/dL   Creatinine, Ser 1.10 (H) 0.44 - 1.00 mg/dL   Glucose, Bld >700 (HH) 70 - 99 mg/dL   Calcium, Ion 1.11 (L) 1.15 - 1.40 mmol/L   TCO2 6 (L) 22 - 32 mmol/L   Hemoglobin 16.7 (H) 12.0 - 15.0 g/dL   HCT 49.0 (H) 36.0 - 46.0 %   Comment NOTIFIED PHYSICIAN   Glucose, capillary     Status: Abnormal   Collection Time: 05/29/18  9:22 PM  Result Value Ref Range   Glucose-Capillary >600 (HH) 70 - 99 mg/dL   Dg Chest Port 1 View  Result Date: 05/29/2018 CLINICAL DATA:  Dyspnea EXAM: PORTABLE CHEST 1 VIEW COMPARISON:  06/13/2017 FINDINGS: The heart size and mediastinal contours are within normal limits. Both lungs are clear. The visualized skeletal structures are unremarkable. IMPRESSION: No active disease. Electronically Signed   By: Donavan Foil M.D.   On: 05/29/2018 20:01    Pending Labs Unresulted Labs (From admission, onward)    Start     Ordered   05/29/18 1946  Beta-hydroxybutyric acid  Once,   STAT     05/29/18 1945   05/29/18 1943  Blood culture (routine x 2)  BLOOD CULTURE X 2,   STAT     05/29/18 1942   05/29/18 1936  Urinalysis, Complete w Microscopic  ONCE - STAT,   STAT      05/29/18 1936   Signed and Held  CBC  (enoxaparin (LOVENOX)    CrCl >/= 30 ml/min)  Once,   R    Comments:  Baseline for enoxaparin therapy IF NOT ALREADY DRAWN.  Notify MD if PLT < 100 K.    Signed and Held   Signed and Held  Creatinine, serum  (enoxaparin (LOVENOX)    CrCl >/= 30 ml/min)  Once,   R    Comments:  Baseline for enoxaparin therapy IF NOT ALREADY DRAWN.    Signed and Held   Signed and Held  Creatinine, serum  (enoxaparin (LOVENOX)    CrCl >/= 30 ml/min)  Weekly,   R    Comments:  while on enoxaparin therapy    Signed and Held   Signed and Held  Basic metabolic panel  STAT Now then every 4 hours ,   STAT     Signed and Held          Vitals/Pain Today's Vitals   05/29/18 2045 05/29/18 2056 05/29/18 2100 05/29/18 2132  BP:    137/78  Pulse: (!) 124   (!) 120  Resp: (!) 23  (!) 26 (!) 25  Temp:    98 F (36.7 C)  TempSrc:      SpO2: 100%   99%  Weight:      PainSc:  5       Isolation Precautions No active isolations  Medications Medications  sodium chloride 0.9 % bolus 1,000 mL (1,000 mLs Intravenous New Bag/Given 05/29/18 2037)  dextrose 5 %-0.45 % sodium chloride infusion (has no administration in time range)  insulin regular, human (MYXREDLIN) 100 units/ 100 mL infusion (5.4 Units/hr Intravenous New Bag/Given 05/29/18 2125)  sodium chloride 0.9 % bolus 1,000 mL (0 mLs Intravenous Stopped 05/29/18 2023)  fentaNYL (SUBLIMAZE) injection 50 mcg (50 mcg Intravenous Given 05/29/18 1949)  ondansetron (ZOFRAN) injection 4 mg (4 mg Intravenous Given 05/29/18 1949)  fentaNYL (SUBLIMAZE) injection 50 mcg (50 mcg Intravenous Given 05/29/18 2039)  cefTAZidime (FORTAZ) 2 g in sodium chloride 0.9 % 100 mL IVPB (0 g Intravenous Stopped 05/29/18 2127)  HYDROmorphone (DILAUDID) injection 0.5 mg (0.5 mg Intravenous Given 05/29/18 2129)    Mobility walks

## 2018-05-29 NOTE — Progress Notes (Signed)
Family Meeting Note  Advance Directive:yes  Today a meeting took place with the Patient.  Patient is able to participate   The following clinical team members were present during this meeting:MD  The following were discussed:Patient's diagnosis: dka, Patient's progosis: Unable to determine and Goals for treatment: Full Code  Additional follow-up to be provided: prn  Time spent during discussion:20 minutes  Gorden Harms, MD

## 2018-05-29 NOTE — ED Notes (Signed)
Attempted to call report. Primary RN unavailable

## 2018-05-29 NOTE — ED Notes (Signed)
Attempted to call report to primary RN. RN unavailable at this time.

## 2018-05-29 NOTE — ED Notes (Signed)
John RN at bedside to attempt ultrasound PIV

## 2018-05-29 NOTE — H&P (Signed)
[place] [facility] [place] NAME: Tanya Graves    MR#:  [id number]  DATE OF BIRTH:  Jun 21, 1965  DATE OF ADMISSION:  05/29/2018  PRIMARY CARE PHYSICIAN: Casilda Carls, MD   REQUESTING/REFERRING PHYSICIAN:   CHIEF COMPLAINT:   Chief Complaint  Patient presents with  . Emesis    HISTORY OF PRESENT ILLNESS: Tanya Graves  is a 53 y.o. female with a known history per below presenting to the emergency room with nausea, emesis, abdominal pain, generalized weakness, fatigue, ER work-up noted for acute DKA, pH 6.9, blood sugar 700, patient valuated in the emergency room, admits to dietary indiscretion as well as medication noncompliance, patient is now been admitted for acute recurrent diabetic ketoacidosis.  PAST MEDICAL HISTORY:   Past Medical History:  Diagnosis Date  . Arthritis   . Diabetes mellitus without complication ([place])   . Thyroid disease     PAST SURGICAL HISTORY:  Past Surgical History:  Procedure Laterality Date  . APPENDECTOMY  1993  . CALCANEAL OSTEOTOMY Left 07/17/2016   Procedure: CALCANEAL OSTEOTOMY;  Surgeon: [name], MD;  Location: [facility] ORS;  Service: Orthopedics;  Laterality: Left;  . IRRIGATION AND DEBRIDEMENT ABSCESS N/A 01/24/2015   Procedure: IRRIGATION AND DEBRIDEMENT ABSCESS/GROIN ABSCESS;  Surgeon: [name], MD;  Location: [facility] ORS;  Service: General;  Laterality: N/A;  . KNEE ARTHROSCOPY W/ MENISCAL REPAIR Right 2008  . ROTATOR CUFF REPAIR Right 2015  . TONSILLECTOMY  1998    SOCIAL HISTORY:  Social History   Tobacco Use  . Smoking status: Current Some Day Smoker    Packs/day: 0.25    Types: Cigarettes  . Smokeless tobacco: Never Used  Substance Use Topics  . Alcohol use: Yes    Comment: Occasional    FAMILY HISTORY:  Family History  Problem Relation Age of Onset  . Diabetes Other   . Diabetes Father   . Heart failure Father   . Diabetes Brother   . Breast cancer Neg Hx     DRUG  ALLERGIES:  Allergies  Allergen Reactions  . Corticosteroids Other (See Comments)    Elevate blood sugar    REVIEW OF SYSTEMS:   CONSTITUTIONAL: No fever,+ fatigue, weakness.  EYES: No blurred or double vision.  EARS, NOSE, AND THROAT: No tinnitus or ear pain.  RESPIRATORY: No cough, shortness of breath, wheezing or hemoptysis.  CARDIOVASCULAR: No chest pain, orthopnea, edema.  GASTROINTESTINAL: + nausea, vomiting, abdominal pain.  GENITOURINARY: No dysuria, hematuria.  ENDOCRINE: No polyuria, nocturia,  HEMATOLOGY: No anemia, easy bruising or bleeding SKIN: No rash or lesion. MUSCULOSKELETAL: No joint pain or arthritis.   NEUROLOGIC: No tingling, numbness, weakness.  PSYCHIATRY: No anxiety or depression.   MEDICATIONS AT HOME:  Prior to Admission medications   Medication Sig Start Date End Date Taking? Authorizing Provider  atorvastatin (LIPITOR) 10 MG tablet Take 10 mg by mouth daily.   Yes [provider]  insulin aspart (NOVOLOG) 100 UNIT/ML injection Inject 15 Units into the skin 3 (three) times daily before meals. sliding scale   Yes [provider]  Insulin Glargine (TOUJEO SOLOSTAR) 300 UNIT/ML SOPN Inject 46 Units into the skin every morning. 06/14/17  Yes Hillary Bow, MD  levothyroxine (SYNTHROID, LEVOTHROID) 175 MCG tablet Take 1 tablet by mouth daily before breakfast.  06/08/16  Yes [provider]  metFORMIN (GLUCOPHAGE) 500 MG tablet Take 1,000 mg by mouth 2 (two) times daily with a meal.    Yes [provider]  albuterol (PROVENTIL HFA;VENTOLIN HFA) 108 (90 Base) MCG/ACT inhaler Inhale 1-2 puffs into the lungs every 6 (six) hours as needed for wheezing or shortness of breath.    [provider]  cetirizine (ZYRTEC) 10 MG tablet Take 10 mg by mouth daily.    [provider]  HUMALOG KWIKPEN 100 UNIT/ML KiwkPen Inject 15 Units into the skin 3 (three) times daily.  06/06/17   [provider]  Vitamin D,  Ergocalciferol, (DRISDOL) 50000 units CAPS capsule Take 50,000 Units by mouth every 7 (seven) days. Every Sunday    [provider]      PHYSICAL EXAMINATION:   VITAL SIGNS: Blood pressure 137/77, pulse (!) 124, temperature 98 F (36.7 C), resp. rate (!) 23, weight 114 kg, last menstrual period 06/28/2015, SpO2 100 %.  GENERAL:  53 y.o.-year-old patient lying in the bed with no acute distress.  EYES: Pupils equal, round, reactive to light and accommodation. No scleral icterus. Extraocular muscles intact.  HEENT: Head atraumatic, normocephalic. Oropharynx and nasopharynx clear.  Dry mucous membranes NECK:  Supple, no jugular venous distention. No thyroid enlargement, no tenderness.  Poor skin turgor LUNGS: Normal breath sounds bilaterally, no wheezing, rales,rhonchi or crepitation. No use of accessory muscles of respiration.  CARDIOVASCULAR: S1, S2 normal. No murmurs, rubs, or gallops.  ABDOMEN: Soft, nontender, nondistended. Bowel sounds present. No organomegaly or mass.  EXTREMITIES: No pedal edema, cyanosis, or clubbing.  NEUROLOGIC: Cranial nerves II through XII are intact. Muscle strength 5/5 in all extremities. Sensation intact. Gait not checked.  PSYCHIATRIC: The patient is alert and oriented x 3.  SKIN: No obvious rash, lesion, or ulcer.   LABORATORY PANEL:   CBC Recent Labs  Lab 05/29/18 1948 05/29/18 2041  WBC 25.2*  --   HGB 16.0* 16.7*  HCT 49.6* 49.0*  PLT 376  --   MCV 100.0  --   MCH 32.3  --   MCHC 32.3  --   RDW 12.0  --    ------------------------------------------------------------------------------------------------------------------  Chemistries  Recent Labs  Lab 05/29/18 1948 05/29/18 2041  NA 133* 132*  K 4.8 5.4*  CL 96* 101  CO2 <7*  --   GLUCOSE 782* >700*  BUN 22* 22*  CREATININE 1.62* 1.10*  CALCIUM 8.5*  --   AST 21  --   ALT 17  --   ALKPHOS 120  --   BILITOT 1.2  --     ------------------------------------------------------------------------------------------------------------------ CrCl cannot be calculated (Unknown ideal weight.). ------------------------------------------------------------------------------------------------------------------ No results for input(s): TSH, T4TOTAL, T3FREE, THYROIDAB in the last 72 hours.  Invalid input(s): FREET3   Coagulation profile No results for input(s): INR, PROTIME in the last 168 hours. ------------------------------------------------------------------------------------------------------------------- No results for input(s): DDIMER in the last 72 hours. -------------------------------------------------------------------------------------------------------------------  Cardiac Enzymes No results for input(s): CKMB, TROPONINI, MYOGLOBIN in the last 168 hours.  Invalid input(s): CK ------------------------------------------------------------------------------------------------------------------ Invalid input(s): POCBNP  ---------------------------------------------------------------------------------------------------------------  Urinalysis    Component Value Date/Time   COLORURINE STRAW (A) 06/13/2017 0921   APPEARANCEUR CLEAR (A) 06/13/2017 0921   APPEARANCEUR Clear 02/12/2014 2210   LABSPEC 1.021 06/13/2017 0921   LABSPEC 1.026 02/12/2014 2210   PHURINE 5.0 06/13/2017 0921   GLUCOSEU >=500 (A) 06/13/2017 0921   GLUCOSEU >=500 02/12/2014 2210   HGBUR MODERATE (A) 06/13/2017 0921   BILIRUBINUR NEGATIVE 06/13/2017 0921   BILIRUBINUR Negative 02/12/2014 2210   KETONESUR 80 (A) 06/13/2017 0921   PROTEINUR 30 (A) 06/13/2017 0921   NITRITE NEGATIVE 06/13/2017 [place] 06/13/2017 5885  LEUKOCYTESUR Negative 02/12/2014 2210     RADIOLOGY: Dg Chest Port 1 View  Result Date: 05/29/2018 CLINICAL DATA:  Dyspnea EXAM: PORTABLE CHEST 1 VIEW COMPARISON:  06/13/2017 FINDINGS: The  heart size and mediastinal contours are within normal limits. Both lungs are clear. The visualized skeletal structures are unremarkable. IMPRESSION: No active disease. Electronically Signed   By: Donavan Foil M.D.   On: 05/29/2018 20:01    EKG: Orders placed or performed during the hospital encounter of 06/13/17  . ED EKG  . ED EKG  . EKG 12-Lead  . EKG 12-Lead    IMPRESSION AND PLAN: *Acute recurrent diabetic ketoacidosis secondary to medication noncompliance/dietary indiscretion *Acute hyperkalemia *Acute pseudohyponatremia *Chronic hypothyroidism, unspecified *Acute dehydration *Noncompliance with medical management   Admit to stepdown unit on our DKA protocol, case discussed with intensivist, IV fluids for rehydration, follow-up on cultures, continue Synthroid, and continue close medical monitoring.  All the records are reviewed and case discussed with ED provider. Management plans discussed with the patient, family and they are in agreement.  CODE STATUS:full Code Status History    Date Active Date Inactive Code Status Order ID Comments User Context   06/13/2017 1147 06/14/2017 2212 Full Code [id number]  Hillary Bow, MD ED   01/22/2015 2257 01/25/2015 1856 Full Code [id number]  Hower, Aaron Mose, MD ED       TOTAL TIME TAKING CARE OF THIS PATIENT: 40 minutes.    Avel Peace Amaura Authier M.D on 05/29/2018   Between 7am to 6pm - Pager - [phone]  After 6pm go to [email or web address] - password [email or web address] [place] [facility]  Office  [phone]  CC: Primary care physician; Casilda Carls, MD   Note: This dictation was prepared with Dragon dictation along with smaller phrase technology. Any transcriptional errors that result from this process are unintentional.

## 2018-05-29 NOTE — ED Notes (Signed)
Attempted to call report to floor. Primary RN unavailable at this time.

## 2018-05-29 NOTE — Progress Notes (Signed)
CODE SEPSIS - PHARMACY COMMUNICATION  **Broad Spectrum Antibiotics should be administered within 1 hour of Sepsis diagnosis**  Time Code Sepsis Called/Page Received:  1/3 @ 20:50   Antibiotics Ordered:  Vanc, Ceftazidime   Time of 1st antibiotic administration: 1/3 @ 20:57   Additional action taken by pharmacy: none   If necessary, Name of Provider/Nurse Contacted:     Selen Smucker D ,PharmD Clinical Pharmacist  05/29/2018  9:05 PM

## 2018-05-29 NOTE — Progress Notes (Signed)
Acute recurrent diabetic ketoacidosis secondary to medication noncompliance/dietary indiscretion with Acute hyperkalemia and dehydration   Admit to stepdown unit on our DKA protocol IV fluids for rehydration

## 2018-05-30 ENCOUNTER — Other Ambulatory Visit: Payer: Self-pay

## 2018-05-30 DIAGNOSIS — E1110 Type 2 diabetes mellitus with ketoacidosis without coma: Principal | ICD-10-CM

## 2018-05-30 DIAGNOSIS — A419 Sepsis, unspecified organism: Secondary | ICD-10-CM

## 2018-05-30 LAB — URINALYSIS, COMPLETE (UACMP) WITH MICROSCOPIC
Bacteria, UA: NONE SEEN
Bilirubin Urine: NEGATIVE
Glucose, UA: >=500 mg/dL — AB
Ketones, ur: 80 mg/dL — AB
Leukocytes, UA: NEGATIVE
Nitrite: NEGATIVE
Protein, ur: 100 mg/dL — AB
Specific Gravity, Urine: 1.017 (ref 1.005–1.030)
pH: 5 (ref 5.0–8.0)

## 2018-05-30 LAB — GLUCOSE, CAPILLARY
Glucose-Capillary: 107 mg/dL — ABNORMAL HIGH (ref 70–99)
Glucose-Capillary: 123 mg/dL — ABNORMAL HIGH (ref 70–99)
Glucose-Capillary: 134 mg/dL — ABNORMAL HIGH (ref 70–99)
Glucose-Capillary: 145 mg/dL — ABNORMAL HIGH (ref 70–99)
Glucose-Capillary: 147 mg/dL — ABNORMAL HIGH (ref 70–99)
Glucose-Capillary: 148 mg/dL — ABNORMAL HIGH (ref 70–99)
Glucose-Capillary: 151 mg/dL — ABNORMAL HIGH (ref 70–99)
Glucose-Capillary: 151 mg/dL — ABNORMAL HIGH (ref 70–99)
Glucose-Capillary: 154 mg/dL — ABNORMAL HIGH (ref 70–99)
Glucose-Capillary: 159 mg/dL — ABNORMAL HIGH (ref 70–99)
Glucose-Capillary: 163 mg/dL — ABNORMAL HIGH (ref 70–99)
Glucose-Capillary: 167 mg/dL — ABNORMAL HIGH (ref 70–99)
Glucose-Capillary: 170 mg/dL — ABNORMAL HIGH (ref 70–99)
Glucose-Capillary: 173 mg/dL — ABNORMAL HIGH (ref 70–99)
Glucose-Capillary: 175 mg/dL — ABNORMAL HIGH (ref 70–99)
Glucose-Capillary: 180 mg/dL — ABNORMAL HIGH (ref 70–99)
Glucose-Capillary: 212 mg/dL — ABNORMAL HIGH (ref 70–99)
Glucose-Capillary: 275 mg/dL — ABNORMAL HIGH (ref 70–99)
Glucose-Capillary: 335 mg/dL — ABNORMAL HIGH (ref 70–99)
Glucose-Capillary: 395 mg/dL — ABNORMAL HIGH (ref 70–99)
Glucose-Capillary: 435 mg/dL — ABNORMAL HIGH (ref 70–99)
Glucose-Capillary: 488 mg/dL — ABNORMAL HIGH (ref 70–99)
Glucose-Capillary: 96 mg/dL (ref 70–99)

## 2018-05-30 LAB — BASIC METABOLIC PANEL
Anion gap: UNDETERMINED (ref 5–15)
Anion gap: 25 — ABNORMAL HIGH (ref 5–15)
Anion gap: 16 — ABNORMAL HIGH (ref 5–15)
Anion gap: 11 (ref 5–15)
Anion gap: 11 (ref 5–15)
Anion gap: 8 (ref 5–15)
BUN: 26 mg/dL — ABNORMAL HIGH (ref 6–20)
BUN: 27 mg/dL — ABNORMAL HIGH (ref 6–20)
BUN: 27 mg/dL — ABNORMAL HIGH (ref 6–20)
BUN: 25 mg/dL — ABNORMAL HIGH (ref 6–20)
BUN: 24 mg/dL — ABNORMAL HIGH (ref 6–20)
BUN: 23 mg/dL — ABNORMAL HIGH (ref 6–20)
CO2: <7 mmol/L — ABNORMAL LOW (ref 22–32)
CO2: 9 mmol/L — ABNORMAL LOW (ref 22–32)
CO2: 11 mmol/L — ABNORMAL LOW (ref 22–32)
CO2: 15 mmol/L — ABNORMAL LOW (ref 22–32)
CO2: 13 mmol/L — ABNORMAL LOW (ref 22–32)
CO2: 17 mmol/L — ABNORMAL LOW (ref 22–32)
Calcium: 8.7 mg/dL — ABNORMAL LOW (ref 8.9–10.3)
Calcium: 8.4 mg/dL — ABNORMAL LOW (ref 8.9–10.3)
Calcium: 8.1 mg/dL — ABNORMAL LOW (ref 8.9–10.3)
Calcium: 8 mg/dL — ABNORMAL LOW (ref 8.9–10.3)
Calcium: 8.3 mg/dL — ABNORMAL LOW (ref 8.9–10.3)
Calcium: 8.1 mg/dL — ABNORMAL LOW (ref 8.9–10.3)
Chloride: 102 mmol/L (ref 98–111)
Chloride: 106 mmol/L (ref 98–111)
Chloride: 111 mmol/L (ref 98–111)
Chloride: 111 mmol/L (ref 98–111)
Chloride: 115 mmol/L — ABNORMAL HIGH (ref 98–111)
Chloride: 110 mmol/L (ref 98–111)
Creatinine, Ser: 1.73 mg/dL — ABNORMAL HIGH (ref 0.44–1.00)
Creatinine, Ser: 1.68 mg/dL — ABNORMAL HIGH (ref 0.44–1.00)
Creatinine, Ser: 1.48 mg/dL — ABNORMAL HIGH (ref 0.44–1.00)
Creatinine, Ser: 1.07 mg/dL — ABNORMAL HIGH (ref 0.44–1.00)
Creatinine, Ser: 1.11 mg/dL — ABNORMAL HIGH (ref 0.44–1.00)
Creatinine, Ser: 0.92 mg/dL (ref 0.44–1.00)
GFR calc Af Amer: 39 mL/min — ABNORMAL LOW (ref >60)
GFR calc Af Amer: 40 mL/min — ABNORMAL LOW (ref >60)
GFR calc Af Amer: 47 mL/min — ABNORMAL LOW (ref >60)
GFR calc Af Amer: >60 mL/min (ref >60)
GFR calc Af Amer: >60 mL/min (ref >60)
GFR calc Af Amer: >60 mL/min (ref >60)
GFR calc non Af Amer: 34 mL/min — ABNORMAL LOW (ref >60)
GFR calc non Af Amer: 35 mL/min — ABNORMAL LOW (ref >60)
GFR calc non Af Amer: 41 mL/min — ABNORMAL LOW (ref >60)
GFR calc non Af Amer: >60 mL/min (ref >60)
GFR calc non Af Amer: 57 mL/min — ABNORMAL LOW (ref >60)
GFR calc non Af Amer: >60 mL/min (ref >60)
Glucose, Bld: 539 mg/dL (ref 70–99)
Glucose, Bld: 353 mg/dL — ABNORMAL HIGH (ref 70–99)
Glucose, Bld: 193 mg/dL — ABNORMAL HIGH (ref 70–99)
Glucose, Bld: 150 mg/dL — ABNORMAL HIGH (ref 70–99)
Glucose, Bld: 127 mg/dL — ABNORMAL HIGH (ref 70–99)
Glucose, Bld: 178 mg/dL — ABNORMAL HIGH (ref 70–99)
Potassium: 4.7 mmol/L (ref 3.5–5.1)
Potassium: 4.5 mmol/L (ref 3.5–5.1)
Potassium: 4.3 mmol/L (ref 3.5–5.1)
Potassium: 3.6 mmol/L (ref 3.5–5.1)
Potassium: 4.6 mmol/L (ref 3.5–5.1)
Potassium: 3.4 mmol/L — ABNORMAL LOW (ref 3.5–5.1)
Sodium: 140 mmol/L (ref 135–145)
Sodium: 140 mmol/L (ref 135–145)
Sodium: 138 mmol/L (ref 135–145)
Sodium: 137 mmol/L (ref 135–145)
Sodium: 139 mmol/L (ref 135–145)
Sodium: 135 mmol/L (ref 135–145)

## 2018-05-30 LAB — CBC
HCT: 47.1 % — ABNORMAL HIGH (ref 36.0–46.0)
Hemoglobin: 15.1 g/dL — ABNORMAL HIGH (ref 12.0–15.0)
MCH: 32.1 pg (ref 26.0–34.0)
MCHC: 32.1 g/dL (ref 30.0–36.0)
MCV: 100 fL (ref 80.0–100.0)
Platelets: 373 10*3/uL (ref 150–400)
RBC: 4.71 MIL/uL (ref 3.87–5.11)
RDW: 11.9 % (ref 11.5–15.5)
WBC: 33.9 10*3/uL — ABNORMAL HIGH (ref 4.0–10.5)
nRBC: 0 % (ref 0.0–0.2)

## 2018-05-30 LAB — MRSA PCR SCREENING: MRSA by PCR: NEGATIVE

## 2018-05-30 MED ORDER — CYCLOBENZAPRINE HCL 10 MG PO TABS
5.0000 mg | ORAL_TABLET | Freq: Two times a day (BID) | ORAL | Status: DC | PRN
  Administered 2018-05-30: 5 mg via ORAL
  Filled 2018-05-30 (×2): qty 0.5

## 2018-05-30 MED ORDER — MORPHINE SULFATE (PF) 2 MG/ML IV SOLN
1.0000 mg | INTRAVENOUS | Status: DC | PRN
  Administered 2018-05-30: 2 mg via INTRAVENOUS
  Filled 2018-05-30: qty 1

## 2018-05-30 MED ORDER — PROMETHAZINE HCL 25 MG/ML IJ SOLN
6.2500 mg | Freq: Four times a day (QID) | INTRAMUSCULAR | Status: DC | PRN
  Administered 2018-05-30: 12.5 mg via INTRAVENOUS
  Administered 2018-05-31: 6.25 mg via INTRAVENOUS
  Filled 2018-05-30 (×2): qty 1

## 2018-05-30 MED ORDER — VANCOMYCIN HCL 10 G IV SOLR
1250.0000 mg | Freq: Two times a day (BID) | INTRAVENOUS | Status: DC
  Administered 2018-05-30 – 2018-05-31 (×3): 1250 mg via INTRAVENOUS
  Filled 2018-05-30 (×5): qty 1250

## 2018-05-30 MED ORDER — ENOXAPARIN SODIUM 40 MG/0.4ML ~~LOC~~ SOLN
40.0000 mg | SUBCUTANEOUS | Status: DC
  Administered 2018-05-30 – 2018-05-31 (×2): 40 mg via SUBCUTANEOUS
  Filled 2018-05-30 (×2): qty 0.4

## 2018-05-30 NOTE — Progress Notes (Signed)
Follow up - Critical Care Medicine Note  Patient Details:    Tanya Graves is an 53 y.o. female  with a past medical history of diabetes, below presenting to the emergency room with nausea, emesis, abdominal pain, generalized weakness, fatigue, ER work-up noted for acute DKA, pH 6.9, blood sugar 700.  Admitted to the intensive care unit for DKA protocol  Lines, Airways, Drains:    Anti-infectives:  Anti-infectives (From admission, onward)   Start     Dose/Rate Route Frequency Ordered Stop   05/30/18 0200  vancomycin (VANCOCIN) 1,250 mg in sodium chloride 0.9 % 250 mL IVPB     1,250 mg 166.7 mL/hr over 90 Minutes Intravenous Every 12 hours 05/30/18 0147     05/29/18 2045  cefTAZidime (FORTAZ) 2 g in sodium chloride 0.9 % 100 mL IVPB     2 g 200 mL/hr over 30 Minutes Intravenous  Once 05/29/18 2039 05/29/18 2127      Microbiology: Results for orders placed or performed during the hospital encounter of 05/29/18  Blood culture (routine x 2)     Status: None (Preliminary result)   Collection Time: 05/29/18  7:48 PM  Result Value Ref Range Status   Specimen Description BLOOD RIGHT FOOT  Final   Special Requests   Final    BOTTLES DRAWN AEROBIC AND ANAEROBIC Blood Culture adequate volume   Culture   Final    NO GROWTH < 12 HOURS Performed at [facility], [address] [place], [place] [address]    Report Status PENDING  Incomplete  Blood culture (routine x 2)     Status: None (Preliminary result)   Collection Time: 05/29/18  7:48 PM  Result Value Ref Range Status   Specimen Description BLOOD LEFT AC  Final   Special Requests   Final    BOTTLES DRAWN AEROBIC AND ANAEROBIC Blood Culture results may not be optimal due to an excessive volume of blood received in culture bottles   Culture   Final    NO GROWTH < 12 HOURS Performed at [facility], [address], [place], [place] [address]    Report Status PENDING  Incomplete  MRSA PCR Screening     Status:  None   Collection Time: 05/29/18 11:39 PM  Result Value Ref Range Status   MRSA by PCR NEGATIVE NEGATIVE Final    Comment:        The GeneXpert MRSA Assay (FDA approved for NASAL specimens only), is one component of a comprehensive MRSA colonization surveillance program. It is not intended to diagnose MRSA infection nor to guide or monitor treatment for MRSA infections. Performed at [facility], [address] [place], [place] [address]    Studies: Dg Chest [facility] 1 View  Result Date: 05/29/2018 CLINICAL DATA:  Dyspnea EXAM: PORTABLE CHEST 1 VIEW COMPARISON:  06/13/2017 FINDINGS: The heart size and mediastinal contours are within normal limits. Both lungs are clear. The visualized skeletal structures are unremarkable. IMPRESSION: No active disease. Electronically Signed   By: Donavan Foil M.D.   On: 05/29/2018 20:01    Consults: Treatment Team:  Fredirick Maudlin, MD   Subjective:    Overnight Issues:   Objective:  Vital signs for last 24 hours: Temp:  [96.5 F (35.8 C)-98.3 F (36.8 C)] 97.8 F (36.6 C) (01/04 0800) Pulse Rate:  [109-131] 117 (01/04 0900) Resp:  [16-33] 23 (01/04 0900) BP: (106-163)/(65-106) 106/76 (01/04 0900) SpO2:  [94 %-100 %] 98 % (01/04 0900) Weight:  [  114 kg-114.5 kg] 114.5 kg (01/04 0000)  Hemodynamic parameters for last 24 hours:    Intake/Output from previous day: 01/03 0701 - 01/04 0700 In: 16.4 [I.V.:16.4] Out: -   Intake/Output this shift: No intake/output data recorded.  Vent settings for last 24 hours:    Physical Exam:  Vital signs: Patient is hemodynamically stable.  Please see the above listed vital signs HEENT: Trachea midline, no thyromegaly noted, no carotid bruits appreciated, no accessory muscle utilization noted Cardiovascular: Sinus tachycardia Pulmonary: Clear to auscultation Abdominal: Positive bowel sounds, soft exam Extremities: No clubbing, cyanosis or edema noted Neurologic: No focal deficits  appreciated  Assessment/Plan:   DKA.  On protocol.  Anion gap is still elevated at 16, blood sugar is 193, CO2 is 11 and potassium is 4.3.  We will continue following protocol, transition when gap closes, close follow-up of electrolytes.  Patient is doing well this morning.   Hermelinda Dellen, DO  Guila Owensby 05/30/2018  *Care during the described time interval was provided by me and/or other providers on the critical care team.  I have reviewed this patient's available data, including medical history, events of note, physical examination and test results as part of my evaluation.

## 2018-05-30 NOTE — Progress Notes (Signed)
[facility] NAME: Tanya Graves    MR#:  [id number]  DATE OF BIRTH:  August 03, 1965  SUBJECTIVE:   States she is feeling a little bit better this morning.  She did not sleep well last night and is pretty tired this morning.  Remains on insulin drip.  REVIEW OF SYSTEMS:  Review of Systems  Constitutional: Positive for malaise/fatigue. Negative for chills and fever.  HENT: Negative for congestion and sore throat.   Eyes: Negative for blurred vision and double vision.  Respiratory: Negative for cough and shortness of breath.   Cardiovascular: Negative for chest pain and leg swelling.  Gastrointestinal: Negative for nausea and vomiting.  Genitourinary: Negative for dysuria and urgency.  Musculoskeletal: Negative for back pain and neck pain.  Neurological: Negative for dizziness and headaches.  Psychiatric/Behavioral: Negative for depression. The patient has insomnia.     DRUG ALLERGIES:   Allergies  Allergen Reactions  . Corticosteroids Other (See Comments)    Elevate blood sugar   VITALS:  Blood pressure (!) 115/52, pulse 99, temperature 98.1 F (36.7 C), temperature source Oral, resp. rate (!) 23, height 5\' 8"  (1.727 m), weight 114.5 kg, last menstrual period 06/28/2015, SpO2 97 %. PHYSICAL EXAMINATION:  Physical Exam  GENERAL:  53 y.o.-year-old patient lying in the bed with no acute distress.  EYES: Pupils equal, round, reactive to light and accommodation. No scleral icterus. Extraocular muscles intact.  HEENT: Head atraumatic, normocephalic. Oropharynx and nasopharynx clear.    Moist mucous membranes NECK:  Supple, no jugular venous distention. No thyroid enlargement, no tenderness.   LUNGS: Normal breath sounds bilaterally, no wheezing, rales,rhonchi or crepitation. No use of accessory muscles of respiration.  CARDIOVASCULAR: RRR, S1, S2 normal. No murmurs, rubs, or gallops.  ABDOMEN: Soft, nontender, nondistended. Bowel  sounds present. No organomegaly or mass.  EXTREMITIES: No pedal edema, cyanosis, or clubbing.  NEUROLOGIC: Cranial nerves II through XII are intact. Muscle strength 5/5 in all extremities. Sensation intact. Gait not checked.  PSYCHIATRIC: The patient is alert and oriented x 3.  SKIN: No obvious rash, lesion, or ulcer.  LABORATORY PANEL:  Female CBC Recent Labs  Lab 05/30/18 0026  WBC 33.9*  HGB 15.1*  HCT 47.1*  PLT 373   ------------------------------------------------------------------------------------------------------------------ Chemistries  Recent Labs  Lab 05/29/18 1948  05/30/18 1741  NA 133*   < > 139  K 4.8   < > 4.6  CL 96*   < > 115*  CO2 <7*   < > 13*  GLUCOSE 782*   < > 127*  BUN 22*   < > 24*  CREATININE 1.62*   < > 1.11*  CALCIUM 8.5*   < > 8.3*  AST 21  --   --   ALT 17  --   --   ALKPHOS 120  --   --   BILITOT 1.2  --   --    < > = values in this interval not displayed.   RADIOLOGY:  Dg Chest Port 1 View  Result Date: 05/29/2018 CLINICAL DATA:  Dyspnea EXAM: PORTABLE CHEST 1 VIEW COMPARISON:  06/13/2017 FINDINGS: The heart size and mediastinal contours are within normal limits. Both lungs are clear. The visualized skeletal structures are unremarkable. IMPRESSION: No active disease. Electronically Signed   By: Donavan Foil M.D.   On: 05/29/2018 20:01   ASSESSMENT AND PLAN:   DKA, mild-blood sugars improving, but still with an anion gap. -Continue insulin drip for  now, addition to subcutaneous insulin when able -CBGs every hour -BMPs every 4 hours until gap closes -Continue IV fluids  ?Gluteal abscess -Seen by surgery today, who did not feel that this was a true abscess -Still on vancomycin today- consider stopping  Hyperkalemia-resolved with fluids and correction of blood sugars -Monitor  Chronic hypothyroidism-stable -Continue home Synthroid  Hyperlipidemia-stable -Continue home Lipitor  All the records are reviewed and case discussed  with Care Management/Social Worker. Management plans discussed with the patient, family and they are in agreement.  CODE STATUS: Full Code  TOTAL TIME TAKING CARE OF THIS PATIENT: 35 minutes.   More than 50% of the time was spent in counseling/coordination of care: YES  POSSIBLE D/C IN 1-2 DAYS, DEPENDING ON CLINICAL CONDITION.   Berna Spare Mayo M.D on 05/30/2018 at 6:39 PM  Between 7am to 6pm - Pager - [phone]  After 6pm go to [email or web address] - Proofreader  [facility]  Office  [phone]  CC: Primary care physician; Casilda Carls, MD  Note: This dictation was prepared with Dragon dictation along with smaller phrase technology. Any transcriptional errors that result from this process are unintentional.

## 2018-05-30 NOTE — Consult Note (Signed)
Reason for Consult:? abscess Referring Physician: Jacqualine Code, ED  Tanya Graves is an 53 y.o. female.  HPI: Scented to the emergency department in diabetic ketoacidosis.  He apparently reported to the emergency medicine physician on call that she had a previous episode of DKA caused by an abscess.  Despite no abscess identified on the emergency department physician's examination, general surgery is consulted for evaluation of possible abscess.  She states that she feels better this morning.  Her acidosis and blood glucose have both improved.  Her white blood cell count, however has risen.  She denies fevers or chills, nausea or vomiting.  No imaging studies were obtained secondary to critical illness at the time of presentation.  Past Medical History:  Diagnosis Date  . Arthritis   . Diabetes mellitus without complication ([place])   . Thyroid disease     Past Surgical History:  Procedure Laterality Date  . APPENDECTOMY  1993  . CALCANEAL OSTEOTOMY Left 07/17/2016   Procedure: CALCANEAL OSTEOTOMY;  Surgeon: [name], MD;  Location: [facility] ORS;  Service: Orthopedics;  Laterality: Left;  . IRRIGATION AND DEBRIDEMENT ABSCESS N/A 01/24/2015   Procedure: IRRIGATION AND DEBRIDEMENT ABSCESS/GROIN ABSCESS;  Surgeon: [name], MD;  Location: [facility] ORS;  Service: General;  Laterality: N/A;  . KNEE ARTHROSCOPY W/ MENISCAL REPAIR Right 2008  . ROTATOR CUFF REPAIR Right 2015  . TONSILLECTOMY  1998    Family History  Problem Relation Age of Onset  . Diabetes Other   . Diabetes Father   . Heart failure Father   . Diabetes Brother   . Breast cancer Neg Hx     Social History:  reports that she has been smoking cigarettes. She has been smoking about 0.25 packs per day. She has never used smokeless tobacco. She reports current alcohol use. She reports that she does not use drugs.  Allergies:  Allergies  Allergen Reactions  . Corticosteroids Other (See Comments)    Elevate blood sugar     Medications: I have reviewed the patient's current medications.  Results for orders placed or performed during the hospital encounter of 05/29/18 (from the past 48 hour(s))  Blood gas, venous     Status: Abnormal   Collection Time: 05/29/18  7:37 PM  Result Value Ref Range   pH, Ven 6.97 (LL) 7.250 - 7.430    Comment: CRITICAL RESULT CALLED TO, READ BACK BY AND VERIFIED WITH: DR Jacqualine Code [id number] 2039 DT    pCO2, Ven <19.0 (LL) 44.0 - 60.0 mmHg    Comment: CRITICAL RESULT CALLED TO, READ BACK BY AND VERIFIED WITH: DR Jacqualine Code [id number] 2039 DT    pO2, Ven 53.0 (H) 32.0 - 45.0 mmHg   Bicarbonate 3.7 (L) 20.0 - 28.0 mmol/L   Acid-base deficit 26.5 (H) 0.0 - 2.0 mmol/L   O2 Saturation 62.4 %   Patient temperature 37.0    Collection site LINE    Sample type VENOUS     Comment: Performed at [facility], [address] [place], [place] [address]  Glucose, capillary     Status: Abnormal   Collection Time: 05/29/18  7:38 PM  Result Value Ref Range   Glucose-Capillary >600 (HH) 70 - 99 mg/dL  Lipase, blood     Status: None   Collection Time: 05/29/18  7:48 PM  Result Value Ref Range   Lipase 30 11 - 51 U/L    Comment: Performed at [facility], [address], [place], [place] [address]  CBC  Status: Abnormal   Collection Time: 05/29/18  7:48 PM  Result Value Ref Range   WBC 25.2 (H) 4.0 - 10.5 K/uL   RBC 4.96 3.87 - 5.11 MIL/uL   Hemoglobin 16.0 (H) 12.0 - 15.0 g/dL   HCT 49.6 (H) 36.0 - 46.0 %   MCV 100.0 80.0 - 100.0 fL   MCH 32.3 26.0 - 34.0 pg   MCHC 32.3 30.0 - 36.0 g/dL   RDW 12.0 11.5 - 15.5 %   Platelets 376 150 - 400 K/uL   nRBC 0.0 0.0 - 0.2 %    Comment: Performed at [facility], [address] [place], [place] [address]  Comprehensive metabolic panel     Status: Abnormal   Collection Time: 05/29/18  7:48 PM  Result Value Ref Range   Sodium 133 (L) 135 - 145 mmol/L   Potassium 4.8 3.5 - 5.1 mmol/L   Chloride 96 (L) 98 - 111  mmol/L   CO2 <7 (L) 22 - 32 mmol/L   Glucose, Bld 782 (HH) 70 - 99 mg/dL    Comment: CRITICAL RESULT CALLED TO, READ BACK BY AND VERIFIED WITH KELLY GIBSON 05/29/18 @ 2045  MLK    BUN 22 (H) 6 - 20 mg/dL   Creatinine, Ser 1.62 (H) 0.44 - 1.00 mg/dL   Calcium 8.5 (L) 8.9 - 10.3 mg/dL   Total Protein 7.9 6.5 - 8.1 g/dL   Albumin 4.8 3.5 - 5.0 g/dL   AST 21 15 - 41 U/L   ALT 17 0 - 44 U/L   Alkaline Phosphatase 120 38 - 126 U/L   Total Bilirubin 1.2 0.3 - 1.2 mg/dL   GFR calc non Af Amer 36 (L) >60 mL/min   GFR calc Af Amer 42 (L) >60 mL/min   Anion gap UNABLE TO CALCULATE DUE TO NON NUMERIC VALUE [place] 5 - 15    Comment: Performed at [facility], [address] [place], [place] [address]  Blood culture (routine x 2)     Status: None (Preliminary result)   Collection Time: 05/29/18  7:48 PM  Result Value Ref Range   Specimen Description BLOOD RIGHT FOOT    Special Requests      BOTTLES DRAWN AEROBIC AND ANAEROBIC Blood Culture adequate volume   Culture      NO GROWTH < 12 HOURS Performed at [facility], [address], [place], [place] [address]    Report Status PENDING   Blood culture (routine x 2)     Status: None (Preliminary result)   Collection Time: 05/29/18  7:48 PM  Result Value Ref Range   Specimen Description BLOOD LEFT AC    Special Requests      BOTTLES DRAWN AEROBIC AND ANAEROBIC Blood Culture results may not be optimal due to an excessive volume of blood received in culture bottles   Culture      NO GROWTH < 12 HOURS Performed at [facility], [address] [place], [place] [address]    Report Status PENDING   Beta-hydroxybutyric acid     Status: Abnormal   Collection Time: 05/29/18  7:48 PM  Result Value Ref Range   Beta-Hydroxybutyric Acid >8.00 (H) 0.05 - 0.27 mmol/L    Comment: RESULT CONFIRMED BY MANUAL DILUTION [facility] Performed at [facility], [address] [place], [place] [address]   CG4 I-STAT (Lactic acid)      Status: Abnormal   Collection Time: 05/29/18  8:03 PM  Result Value Ref  Range   Lactic Acid, Venous 5.68 (HH) 0.5 - 1.9 mmol/L   Comment NOTIFIED PHYSICIAN   I-STAT, chem 8     Status: Abnormal   Collection Time: 05/29/18  8:41 PM  Result Value Ref Range   Sodium 132 (L) 135 - 145 mmol/L   Potassium 5.4 (H) 3.5 - 5.1 mmol/L   Chloride 101 98 - 111 mmol/L   BUN 22 (H) 6 - 20 mg/dL   Creatinine, Ser 1.10 (H) 0.44 - 1.00 mg/dL   Glucose, Bld >700 (HH) 70 - 99 mg/dL   Calcium, Ion 1.11 (L) 1.15 - 1.40 mmol/L   TCO2 6 (L) 22 - 32 mmol/L   Hemoglobin 16.7 (H) 12.0 - 15.0 g/dL   HCT 49.0 (H) 36.0 - 46.0 %   Comment NOTIFIED PHYSICIAN   Glucose, capillary     Status: Abnormal   Collection Time: 05/29/18  9:22 PM  Result Value Ref Range   Glucose-Capillary >600 (HH) 70 - 99 mg/dL  Glucose, capillary     Status: Abnormal   Collection Time: 05/29/18 10:27 PM  Result Value Ref Range   Glucose-Capillary >600 (HH) 70 - 99 mg/dL  CG4 I-STAT (Lactic acid)     Status: Abnormal   Collection Time: 05/29/18 10:44 PM  Result Value Ref Range   Lactic Acid, Venous 5.04 (HH) 0.5 - 1.9 mmol/L   Comment NOTIFIED PHYSICIAN   Glucose, capillary     Status: Abnormal   Collection Time: 05/29/18 11:29 PM  Result Value Ref Range   Glucose-Capillary 585 (HH) 70 - 99 mg/dL   Comment 1 Notify RN   MRSA PCR Screening     Status: None   Collection Time: 05/29/18 11:39 PM  Result Value Ref Range   MRSA by PCR NEGATIVE NEGATIVE    Comment:        The GeneXpert MRSA Assay (FDA approved for NASAL specimens only), is one component of a comprehensive MRSA colonization surveillance program. It is not intended to diagnose MRSA infection nor to guide or monitor treatment for MRSA infections. Performed at [facility], [address] [place], [place] [address]   CBC     Status: Abnormal   Collection Time: 05/30/18 12:26 AM  Result Value Ref Range   WBC 33.9 (H) 4.0 - 10.5 K/uL   RBC 4.71 3.87  - 5.11 MIL/uL   Hemoglobin 15.1 (H) 12.0 - 15.0 g/dL   HCT 47.1 (H) 36.0 - 46.0 %   MCV 100.0 80.0 - 100.0 fL   MCH 32.1 26.0 - 34.0 pg   MCHC 32.1 30.0 - 36.0 g/dL   RDW 11.9 11.5 - 15.5 %   Platelets 373 150 - 400 K/uL   nRBC 0.0 0.0 - 0.2 %    Comment: Performed at [facility], [address] [place], [place] [address]  Basic metabolic panel     Status: Abnormal   Collection Time: 05/30/18 12:26 AM  Result Value Ref Range   Sodium 140 135 - 145 mmol/L   Potassium 4.7 3.5 - 5.1 mmol/L   Chloride 102 98 - 111 mmol/L   CO2 <7 (L) 22 - 32 mmol/L   Glucose, Bld 539 (HH) 70 - 99 mg/dL    Comment: CRITICAL RESULT CALLED TO, READ BACK BY AND VERIFIED WITH IMMACULATE EGWUATU ON 05/30/18 AT 0126 [facility]    BUN 26 (H) 6 - 20 mg/dL   Creatinine, Ser 1.73 (H) 0.44 - 1.00 mg/dL   Calcium 8.7 (L) 8.9 -  10.3 mg/dL   GFR calc non Af Amer 34 (L) >60 mL/min   GFR calc Af Amer 39 (L) >60 mL/min   Anion gap UNABLE TO CALCULATE DUE TO NON NUMERIC VALUE [place] 5 - 15    Comment: Performed at [facility], [address] [place], [place] [address]  Glucose, capillary     Status: Abnormal   Collection Time: 05/30/18  1:00 AM  Result Value Ref Range   Glucose-Capillary 488 (H) 70 - 99 mg/dL  Glucose, capillary     Status: Abnormal   Collection Time: 05/30/18  2:00 AM  Result Value Ref Range   Glucose-Capillary 435 (H) 70 - 99 mg/dL  Glucose, capillary     Status: Abnormal   Collection Time: 05/30/18  3:04 AM  Result Value Ref Range   Glucose-Capillary 395 (H) 70 - 99 mg/dL  Glucose, capillary     Status: Abnormal   Collection Time: 05/30/18  4:04 AM  Result Value Ref Range   Glucose-Capillary 335 (H) 70 - 99 mg/dL   Comment 1 Notify RN    Comment 2 Document in Chart   Basic metabolic panel     Status: Abnormal   Collection Time: 05/30/18  4:31 AM  Result Value Ref Range   Sodium 140 135 - 145 mmol/L   Potassium 4.5 3.5 - 5.1 mmol/L   Chloride 106 98 - 111 mmol/L   CO2 9 (L)  22 - 32 mmol/L   Glucose, Bld 353 (H) 70 - 99 mg/dL   BUN 27 (H) 6 - 20 mg/dL   Creatinine, Ser 1.68 (H) 0.44 - 1.00 mg/dL   Calcium 8.4 (L) 8.9 - 10.3 mg/dL   GFR calc non Af Amer 35 (L) >60 mL/min   GFR calc Af Amer 40 (L) >60 mL/min   Anion gap 25 (H) 5 - 15    Comment: Performed at [facility], [address] [place], [place] [address]  Glucose, capillary     Status: Abnormal   Collection Time: 05/30/18  5:09 AM  Result Value Ref Range   Glucose-Capillary 275 (H) 70 - 99 mg/dL  Glucose, capillary     Status: Abnormal   Collection Time: 05/30/18  6:12 AM  Result Value Ref Range   Glucose-Capillary 212 (H) 70 - 99 mg/dL  Urinalysis, Complete w Microscopic     Status: Abnormal   Collection Time: 05/30/18  6:37 AM  Result Value Ref Range   Color, Urine YELLOW (A) YELLOW   APPearance HAZY (A) CLEAR   Specific Gravity, Urine 1.017 1.005 - 1.030   pH 5.0 5.0 - 8.0   Glucose, UA >=500 (A) NEGATIVE mg/dL   Hgb urine dipstick SMALL (A) NEGATIVE   Bilirubin Urine NEGATIVE NEGATIVE   Ketones, ur 80 (A) NEGATIVE mg/dL   Protein, ur 100 (A) NEGATIVE mg/dL   Nitrite NEGATIVE NEGATIVE   Leukocytes, UA NEGATIVE NEGATIVE   RBC / HPF 0-5 0 - 5 RBC/hpf   WBC, UA 0-5 0 - 5 WBC/hpf   Bacteria, UA NONE SEEN NONE SEEN   Squamous Epithelial / LPF 0-5 0 - 5   Mucus PRESENT    Hyaline Casts, UA PRESENT     Comment: Performed at [facility], [address] [place], [place] [address]  Glucose, capillary     Status: Abnormal   Collection Time: 05/30/18  7:02 AM  Result Value Ref Range   Glucose-Capillary 167 (H) 70 - 99 mg/dL  Glucose, capillary  Status: Abnormal   Collection Time: 05/30/18  7:59 AM  Result Value Ref Range   Glucose-Capillary 180 (H) 70 - 99 mg/dL  Basic metabolic panel     Status: Abnormal   Collection Time: 05/30/18  8:28 AM  Result Value Ref Range   Sodium 138 135 - 145 mmol/L   Potassium 4.3 3.5 - 5.1 mmol/L    Comment: HEMOLYSIS AT THIS LEVEL  MAY AFFECT RESULT   Chloride 111 98 - 111 mmol/L   CO2 11 (L) 22 - 32 mmol/L   Glucose, Bld 193 (H) 70 - 99 mg/dL   BUN 27 (H) 6 - 20 mg/dL   Creatinine, Ser 1.48 (H) 0.44 - 1.00 mg/dL   Calcium 8.1 (L) 8.9 - 10.3 mg/dL   GFR calc non Af Amer 41 (L) >60 mL/min   GFR calc Af Amer 47 (L) >60 mL/min   Anion gap 16 (H) 5 - 15    Comment: Performed at [facility], [address] [place], [place] [address]  Glucose, capillary     Status: Abnormal   Collection Time: 05/30/18  9:26 AM  Result Value Ref Range   Glucose-Capillary 163 (H) 70 - 99 mg/dL  Glucose, capillary     Status: Abnormal   Collection Time: 05/30/18 10:11 AM  Result Value Ref Range   Glucose-Capillary 159 (H) 70 - 99 mg/dL  Glucose, capillary     Status: Abnormal   Collection Time: 05/30/18 11:08 AM  Result Value Ref Range   Glucose-Capillary 154 (H) 70 - 99 mg/dL  Glucose, capillary     Status: Abnormal   Collection Time: 05/30/18 12:07 PM  Result Value Ref Range   Glucose-Capillary 148 (H) 70 - 99 mg/dL  Glucose, capillary     Status: Abnormal   Collection Time: 05/30/18  1:07 PM  Result Value Ref Range   Glucose-Capillary 145 (H) 70 - 99 mg/dL    Dg Chest Port 1 View  Result Date: 05/29/2018 CLINICAL DATA:  Dyspnea EXAM: PORTABLE CHEST 1 VIEW COMPARISON:  06/13/2017 FINDINGS: The heart size and mediastinal contours are within normal limits. Both lungs are clear. The visualized skeletal structures are unremarkable. IMPRESSION: No active disease. Electronically Signed   By: Donavan Foil M.D.   On: 05/29/2018 20:01    Review of Systems  All other systems reviewed and are negative.  Blood pressure 123/68, pulse (!) 107, temperature 97.8 F (36.6 C), temperature source Oral, resp. rate (!) 22, height 5\' 8"  (1.727 m), weight 114.5 kg, last menstrual period 06/28/2015, SpO2 98 %. Physical Exam  Constitutional: She is oriented to person, place, and time. She appears well-developed and well-nourished. No  distress.  HENT:  Head: Normocephalic and atraumatic.  Eyes: Pupils are equal, round, and reactive to light. Right eye exhibits no discharge. Left eye exhibits no discharge. No scleral icterus.  Neck: Normal range of motion. No thyromegaly present.  Cardiovascular:  Tachycardic, normal rhythm  Respiratory:  Tachypneic, no increased WOB  GI: Soft. She exhibits distension. There is no abdominal tenderness.  Genitourinary:       Genitourinary Comments: Scabbed-over lesion lateral to the introitus, in the fold of skin just below the buttocks. No fluctuance or drainage.    Musculoskeletal:        General: No edema.  Neurological: She is alert and oriented to person, place, and time.  Skin: Skin is warm and dry.  Psychiatric: She has a normal mood and affect. Her behavior is normal.  Labs reviewed.  WBC elevated at 339.  Assessment/Plan: 53 year old woman admitted in diabetic ketoacidosis.  There was some concern that this may have been precipitated by an abscess.  I do not appreciate any skin lesions consistent with an abscess.  There is only a small scabbed over boil.  There is no fluctuance, no erythema, no induration.  No findings concerning for necrotizing soft tissue infection.  If ongoing concern for infection, she may need additional imaging.  Fredirick Maudlin 05/30/2018, 1:45 PM

## 2018-05-30 NOTE — Progress Notes (Signed)
eLink Physician-Brief Progress Note Patient Name: Tanya Graves DOB: 07-09-1965 MRN: [id number]   Date of Service  05/30/2018  HPI/Events of Note  Accepted from ED and discussed with Dr Mortimer Fries earlier. Notes, labs, meds reviewed.  Camera: VS stable.  Discussed with bed side RN.   Admitted for Recurring DKA.  Sepsis from gluteal abscess?. Elevated LA. AGMA. Received fluids. On ceftazidime abx. General surgery consult for possible I and D. As per notes.  BG < 500. K 5.4. CxR neg, UA neg. LFT normal.   eICU Interventions  Continue care On VTE prophylaxis Asp precautions.  Follow DKA protocol.      Intervention Category Intermediate Interventions: Hyperglycemia - evaluation and treatment  Elmer Sow 05/30/2018, 1:26 AM

## 2018-05-30 NOTE — Progress Notes (Signed)
Pharmacy Antibiotic Note  Tanya Graves is a 53 y.o. female admitted on 05/29/2018 with sepsis.  Pharmacy has been consulted for Vancomycin dosing.  Ke=0.071 T/12=9.8hr Vd=59L  Plan: Vancomycin 1250 IV every 12 hours.  Goal trough 15-20 mcg/mL.  Vancomycin trough prior to 5th dose.  Height: 5\' 8"  (172.7 cm) Weight: 252 lb 6.8 oz (114.5 kg) IBW/kg (Calculated) : 63.9  Temp (24hrs), Avg:97.6 F (36.4 C), Min:96.5 F (35.8 C), Max:98.3 F (36.8 C)  Recent Labs  Lab 05/29/18 1948 05/29/18 2003 05/29/18 2041 05/29/18 2244 05/30/18 0026  WBC 25.2*  --   --   --  33.9*  CREATININE 1.62*  --  1.10*  --   --   LATICACIDVEN  --  5.68*  --  5.04*  --     Estimated Creatinine Clearance: 80.3 mL/min (A) (by C-G formula based on SCr of 1.1 mg/dL (H)).    Allergies  Allergen Reactions  . Corticosteroids Other (See Comments)    Elevate blood sugar    Antimicrobials this admission: Vancomycin 1/4 >>   Thank you for allowing pharmacy to be a part of this patient's care.  Paulina Fusi, PharmD, BCPS 05/30/2018 2:05 AM

## 2018-05-30 NOTE — Progress Notes (Signed)
Late entering pt was admitted from ED, s/o DKA. Pt is tachy with Hr in 130's, resp  26+ with a deep and labored breathing pattern. A/ox4, MAEx4, denies any pain at this time, stated nauseated bout not vomiting any more she said. Pt on insulin gtt and cont on DKA protocol as ordered. Pt has some skin discoloration from to bilat thighs and legs. She said PCP is aware and " it has been there for a while". Pt oriented to room and call light.pt asked to call her friend in and visited with her.

## 2018-05-31 LAB — BASIC METABOLIC PANEL
Anion gap: 9 (ref 5–15)
BUN: 19 mg/dL (ref 6–20)
CO2: 19 mmol/L — ABNORMAL LOW (ref 22–32)
Calcium: 8.4 mg/dL — ABNORMAL LOW (ref 8.9–10.3)
Chloride: 110 mmol/L (ref 98–111)
Creatinine, Ser: 0.84 mg/dL (ref 0.44–1.00)
GFR calc Af Amer: >60 mL/min (ref >60)
GFR calc non Af Amer: >60 mL/min (ref >60)
Glucose, Bld: 156 mg/dL — ABNORMAL HIGH (ref 70–99)
Potassium: 3.3 mmol/L — ABNORMAL LOW (ref 3.5–5.1)
Sodium: 138 mmol/L (ref 135–145)

## 2018-05-31 LAB — BLOOD GAS, VENOUS
Acid-base deficit: 4.9 mmol/L — ABNORMAL HIGH (ref 0.0–2.0)
Bicarbonate: 19.8 mmol/L — ABNORMAL LOW (ref 20.0–28.0)
FIO2: 0.21
O2 Saturation: 90.8 %
Patient temperature: 37
pCO2, Ven: 35 mmHg — ABNORMAL LOW (ref 44.0–60.0)
pH, Ven: 7.36 (ref 7.250–7.430)
pO2, Ven: 63 mmHg — ABNORMAL HIGH (ref 32.0–45.0)

## 2018-05-31 LAB — CBC
HCT: 36 % (ref 36.0–46.0)
Hemoglobin: 12.6 g/dL (ref 12.0–15.0)
MCH: 32.2 pg (ref 26.0–34.0)
MCHC: 35 g/dL (ref 30.0–36.0)
MCV: 92.1 fL (ref 80.0–100.0)
Platelets: 172 10*3/uL (ref 150–400)
RBC: 3.91 MIL/uL (ref 3.87–5.11)
RDW: 12.4 % (ref 11.5–15.5)
WBC: 10.8 10*3/uL — ABNORMAL HIGH (ref 4.0–10.5)
nRBC: 0 % (ref 0.0–0.2)

## 2018-05-31 LAB — GLUCOSE, CAPILLARY
Glucose-Capillary: 160 mg/dL — ABNORMAL HIGH (ref 70–99)
Glucose-Capillary: 158 mg/dL — ABNORMAL HIGH (ref 70–99)
Glucose-Capillary: 121 mg/dL — ABNORMAL HIGH (ref 70–99)
Glucose-Capillary: 137 mg/dL — ABNORMAL HIGH (ref 70–99)
Glucose-Capillary: 137 mg/dL — ABNORMAL HIGH (ref 70–99)
Glucose-Capillary: 242 mg/dL — ABNORMAL HIGH (ref 70–99)
Glucose-Capillary: 300 mg/dL — ABNORMAL HIGH (ref 70–99)
Glucose-Capillary: 309 mg/dL — ABNORMAL HIGH (ref 70–99)
Glucose-Capillary: 439 mg/dL — ABNORMAL HIGH (ref 70–99)
Glucose-Capillary: 406 mg/dL — ABNORMAL HIGH (ref 70–99)

## 2018-05-31 MED ORDER — INSULIN ASPART 100 UNIT/ML ~~LOC~~ SOLN: 0.0000 [IU] | Freq: Every day | SUBCUTANEOUS | Status: DC

## 2018-05-31 MED ORDER — INSULIN REGULAR HUMAN 100 UNIT/ML IJ SOLN
30.0000 [IU] | Freq: Once | INTRAMUSCULAR | Status: AC
  Administered 2018-05-31: 30 [IU] via SUBCUTANEOUS
  Filled 2018-05-31: qty 10

## 2018-05-31 MED ORDER — INSULIN GLARGINE 100 UNIT/ML ~~LOC~~ SOLN
30.0000 [IU] | SUBCUTANEOUS | Status: DC
  Administered 2018-05-31: 30 [IU] via SUBCUTANEOUS
  Filled 2018-05-31 (×3): qty 0.3

## 2018-05-31 MED ORDER — SODIUM CHLORIDE 0.9% FLUSH: 3.0000 mL | INTRAVENOUS | Status: DC | PRN

## 2018-05-31 MED ORDER — INFLUENZA VAC SPLIT HIGH-DOSE 0.5 ML IM SUSY
0.5000 mL | PREFILLED_SYRINGE | INTRAMUSCULAR | Status: DC
  Filled 2018-05-31: qty 0.5

## 2018-05-31 MED ORDER — INSULIN ASPART 100 UNIT/ML ~~LOC~~ SOLN
0.0000 [IU] | Freq: Three times a day (TID) | SUBCUTANEOUS | Status: DC
  Administered 2018-05-31: 11 [IU] via SUBCUTANEOUS
  Administered 2018-05-31: 12:00:00 8 [IU] via SUBCUTANEOUS
  Administered 2018-05-31: 5 [IU] via SUBCUTANEOUS
  Administered 2018-06-01: 13:00:00 8 [IU] via SUBCUTANEOUS
  Administered 2018-06-01: 2 [IU] via SUBCUTANEOUS
  Filled 2018-05-31 (×5): qty 1

## 2018-05-31 MED ORDER — ALUM & MAG HYDROXIDE-SIMETH 200-200-20 MG/5ML PO SUSP
30.0000 mL | ORAL | Status: DC | PRN
  Administered 2018-05-31 (×2): 30 mL via ORAL
  Filled 2018-05-31 (×2): qty 30

## 2018-05-31 MED ORDER — IBUPROFEN 400 MG PO TABS
400.0000 mg | ORAL_TABLET | Freq: Four times a day (QID) | ORAL | Status: DC | PRN
  Administered 2018-05-31: 400 mg via ORAL
  Filled 2018-05-31: qty 1

## 2018-05-31 MED ORDER — INSULIN ASPART 100 UNIT/ML ~~LOC~~ SOLN
2.0000 [IU] | Freq: Three times a day (TID) | SUBCUTANEOUS | Status: DC
  Administered 2018-05-31 – 2018-06-01 (×5): 2 [IU] via SUBCUTANEOUS
  Filled 2018-05-31 (×5): qty 1

## 2018-05-31 MED ORDER — PNEUMOCOCCAL 13-VAL CONJ VACC IM SUSP
0.5000 mL | INTRAMUSCULAR | Status: DC
  Filled 2018-05-31: qty 0.5

## 2018-05-31 MED ORDER — POTASSIUM CHLORIDE 10 MEQ/100ML IV SOLN
10.0000 meq | INTRAVENOUS | Status: AC
  Administered 2018-05-31 (×2): 10 meq via INTRAVENOUS
  Filled 2018-05-31 (×2): qty 100

## 2018-05-31 NOTE — Plan of Care (Signed)
Pt off insulin gtt. VSS. Pt has an order to transfer to the floor.

## 2018-05-31 NOTE — Progress Notes (Signed)
Follow up - Critical Care Medicine Note  Patient Details:    Tanya Graves is an 53 y.o. female  with a past medical history of diabetes, below presenting to the emergency room with nausea, emesis, abdominal pain, generalized weakness, fatigue, ER work-up noted for acute DKA, pH 6.9, blood sugar 700.  Admitted to the intensive care unit for DKA protocol  Lines, Airways, Drains:    Anti-infectives:  Anti-infectives (From admission, onward)   Start     Dose/Rate Route Frequency Ordered Stop   05/30/18 0200  vancomycin (VANCOCIN) 1,250 mg in sodium chloride 0.9 % 250 mL IVPB     1,250 mg 166.7 mL/hr over 90 Minutes Intravenous Every 12 hours 05/30/18 0147     05/29/18 2045  cefTAZidime (FORTAZ) 2 g in sodium chloride 0.9 % 100 mL IVPB     2 g 200 mL/hr over 30 Minutes Intravenous  Once 05/29/18 2039 05/29/18 2127      Microbiology: Results for orders placed or performed during the hospital encounter of 05/29/18  Blood culture (routine x 2)     Status: None (Preliminary result)   Collection Time: 05/29/18  7:48 PM  Result Value Ref Range Status   Specimen Description BLOOD RIGHT FOOT  Final   Special Requests   Final    BOTTLES DRAWN AEROBIC AND ANAEROBIC Blood Culture adequate volume   Culture   Final    NO GROWTH 2 DAYS Performed at [facility], [address] [place], [place] [address]    Report Status PENDING  Incomplete  Blood culture (routine x 2)     Status: None (Preliminary result)   Collection Time: 05/29/18  7:48 PM  Result Value Ref Range Status   Specimen Description BLOOD LEFT AC  Final   Special Requests   Final    BOTTLES DRAWN AEROBIC AND ANAEROBIC Blood Culture results may not be optimal due to an excessive volume of blood received in culture bottles   Culture   Final    NO GROWTH 2 DAYS Performed at [facility], [address] [place], [place] [address]    Report Status PENDING  Incomplete  MRSA PCR Screening     Status: None    Collection Time: 05/29/18 11:39 PM  Result Value Ref Range Status   MRSA by PCR NEGATIVE NEGATIVE Final    Comment:        The GeneXpert MRSA Assay (FDA approved for NASAL specimens only), is one component of a comprehensive MRSA colonization surveillance program. It is not intended to diagnose MRSA infection nor to guide or monitor treatment for MRSA infections. Performed at [facility], [address] [place], [place] [address]    Studies: Dg Chest [facility] 1 View  Result Date: 05/29/2018 CLINICAL DATA:  Dyspnea EXAM: PORTABLE CHEST 1 VIEW COMPARISON:  06/13/2017 FINDINGS: The heart size and mediastinal contours are within normal limits. Both lungs are clear. The visualized skeletal structures are unremarkable. IMPRESSION: No active disease. Electronically Signed   By: Donavan Foil M.D.   On: 05/29/2018 20:01    Consults: Treatment Team:  Fredirick Maudlin, MD   Subjective:    Overnight Issues: Doing well this morning.  Complaining of some costal pain, she states she has been throwing up.  Worsened with motion compatible with musculoskeletal Motrin added  Objective:  Vital signs for last 24 hours: Temp:  [98.1 F (36.7 C)-99.2 F (37.3 C)] 98.8 F (37.1 C) (01/05 0744) Pulse Rate:  [95-109] 95 (01/05  0600) Resp:  [0-23] 17 (01/05 0600) BP: (112-137)/(52-79) 131/76 (01/05 0744) SpO2:  [95 %-100 %] 95 % (01/05 0600)  Hemodynamic parameters for last 24 hours:    Intake/Output from previous day: 01/04 0701 - 01/05 0700 In: 121.9 [I.V.:21.9; IV Piggyback:100] Out: 425 [Urine:425]  Intake/Output this shift: No intake/output data recorded.  Vent settings for last 24 hours:    Physical Exam:  Vital signs: Patient is hemodynamically stable.  Please see the above listed vital signs HEENT: Trachea midline, no thyromegaly noted, no carotid bruits appreciated, no accessory muscle utilization noted Cardiovascular: Sinus tachycardia Pulmonary: Clear to  auscultation Abdominal: Positive bowel sounds, soft exam Extremities: No clubbing, cyanosis or edema noted Neurologic: No focal deficits appreciated  Assessment/Plan:   DKA.  On protocol.  Anion gap has resolved, but sugar stable.  On protocol has been transitioned to Lantus with scale coverage.  Hypokalemia.  Being replaced  Hypothyroid.  On replacement  Patient is being transferred to floor  Hermelinda Dellen, DO  Saed Hudlow 05/31/2018  *Care during the described time interval was provided by me and/or other providers on the critical care team.  I have reviewed this patient's available data, including medical history, events of note, physical examination and test results as part of my evaluation. Patient ID: Tanya Graves, female   DOB: [birth year], 53 y.o.   MRN: [id number]

## 2018-05-31 NOTE — Progress Notes (Signed)
Inpatient Diabetes Program Recommendations  AACE/ADA: New Consensus Statement on Inpatient Glycemic Control (2015)  Target Ranges:  Prepandial:   less than 140 mg/dL      Peak postprandial:   less than 180 mg/dL (1-2 hours)      Critically ill patients:  140 - 180 mg/dL   Lab Results  Component Value Date   GLUCAP 300 (H) 05/31/2018   HGBA1C 12.5 (H) 06/13/2017    Review of Glycemic Control  Diabetes history: DM Outpatient Diabetes medications: Toujeo 46 units qd + Novolog 15 units tid meal coverage + Metformin 1 gm bid Current orders for Inpatient glycemic control: Lantus 30 units + Novolog 2 units tid meal coverage tid + Novolog moderate correction tid + hs 0-5 units  Inpatient Diabetes Program Recommendations:   Noted pending A1c -Increase Novolog meal coverage to 5 units tid if eats 50%  Thank you, Bethena Roys E. Xeng Kucher, RN, MSN, CDE  Diabetes Coordinator Inpatient Glycemic Control Team Team Pager [phone] (8am-5pm) 05/31/2018 4:11 PM

## 2018-05-31 NOTE — Consult Note (Signed)
Patient seen on rounds today.  Her blood glucose is improving.  She is off the insulin drip and is being transferred to the general care floor.  There have been no other significant changes since I saw her yesterday. Past Medical History:  Diagnosis Date  . Arthritis   . Diabetes mellitus without complication ([place])   . Thyroid disease    Past Surgical History:  Procedure Laterality Date  . APPENDECTOMY  1993  . CALCANEAL OSTEOTOMY Left 07/17/2016   Procedure: CALCANEAL OSTEOTOMY;  Surgeon: [name], MD;  Location: [facility] ORS;  Service: Orthopedics;  Laterality: Left;  . IRRIGATION AND DEBRIDEMENT ABSCESS N/A 01/24/2015   Procedure: IRRIGATION AND DEBRIDEMENT ABSCESS/GROIN ABSCESS;  Surgeon: [name], MD;  Location: [facility] ORS;  Service: General;  Laterality: N/A;  . KNEE ARTHROSCOPY W/ MENISCAL REPAIR Right 2008  . ROTATOR CUFF REPAIR Right 2015  . TONSILLECTOMY  1998   Family History  Problem Relation Age of Onset  . Diabetes Other   . Diabetes Father   . Heart failure Father   . Diabetes Brother   . Breast cancer Neg Hx    Social History   Tobacco Use  . Smoking status: Current Some Day Smoker    Packs/day: 0.25    Types: Cigarettes  . Smokeless tobacco: Never Used  Substance Use Topics  . Alcohol use: Yes    Comment: Occasional  . Drug use: No   Current Meds  Medication Sig  . atorvastatin (LIPITOR) 10 MG tablet Take 10 mg by mouth daily.  . insulin aspart (NOVOLOG) 100 UNIT/ML injection Inject 15 Units into the skin 3 (three) times daily before meals. sliding scale  . Insulin Glargine (TOUJEO SOLOSTAR) 300 UNIT/ML SOPN Inject 46 Units into the skin every morning.  Marland Kitchen levothyroxine (SYNTHROID, LEVOTHROID) 175 MCG tablet Take 1 tablet by mouth daily before breakfast.   . metFORMIN (GLUCOPHAGE) 500 MG tablet Take 1,000 mg by mouth 2 (two) times daily with a meal.    Allergies  Allergen Reactions  . Corticosteroids Other (See Comments)    Elevate blood  sugar   Vitals:   05/31/18 0744 05/31/18 0918  BP: 131/76 98/62  Pulse:  92  Resp:  20  Temp: 98.8 F (37.1 C) (!) 97.5 F (36.4 C)  SpO2:  94%   I/O last 3 completed shifts: In: 138.3 [I.V.:38.3; IV Piggyback:100] Out: 425 [Urine:425] Total I/O In: 5.1 [I.V.:5.1] Out: -    Focused physical examination: There is no change to the small scabbed over spot seen yesterday.  No other abscesses or other skin lesions are present.  Results for FAIGE, SEELY (MRN [id number]) as of 05/31/2018 10:28  Ref. Range 05/31/2018 09:17  WBC Latest Ref Range: 4.0 - 10.5 K/uL 10.8 (H)  RBC Latest Ref Range: 3.87 - 5.11 MIL/uL 3.91  Hemoglobin Latest Ref Range: 12.0 - 15.0 g/dL 12.6  HCT Latest Ref Range: 36.0 - 46.0 % 36.0  MCV Latest Ref Range: 80.0 - 100.0 fL 92.1  MCH Latest Ref Range: 26.0 - 34.0 pg 32.2  MCHC Latest Ref Range: 30.0 - 36.0 g/dL 35.0  RDW Latest Ref Range: 11.5 - 15.5 % 12.4  Platelets Latest Ref Range: 150 - 400 K/uL 172  nRBC Latest Ref Range: 0.0 - 0.2 % 0.0   Assessment and plan: This is a 53 year old woman admitted with diabetic ketoacidosis.  General surgery was consulted due to the concern that she may have an abscess causing this.  No abscess was ever appreciated.  Her white blood cell count is normalizing.  She has no surgical needs at this time and we will sign off.

## 2018-05-31 NOTE — Progress Notes (Signed)
[facility] [place] NAME: Alonna Bartling    MR#:  [id number]  DATE OF BIRTH:  [birth year]  SUBJECTIVE:   Feeling better this morning.  Off the insulin drip since 4 AM this morning.  Denies any shortness of breath, cough, dysuria.  REVIEW OF SYSTEMS:  Review of Systems  Constitutional: Positive for malaise/fatigue. Negative for chills and fever.  HENT: Negative for congestion and sore throat.   Eyes: Negative for blurred vision and double vision.  Respiratory: Negative for cough and shortness of breath.   Cardiovascular: Negative for chest pain and leg swelling.  Gastrointestinal: Negative for nausea and vomiting.  Genitourinary: Negative for dysuria and urgency.  Musculoskeletal: Negative for back pain and neck pain.  Neurological: Negative for dizziness and headaches.  Psychiatric/Behavioral: Negative for depression. The patient does not have insomnia.     DRUG ALLERGIES:   Allergies  Allergen Reactions  . Corticosteroids Other (See Comments)    Elevate blood sugar   VITALS:  Blood pressure 98/62, pulse 92, temperature (!) 97.5 F (36.4 C), temperature source Oral, resp. rate 20, height 5\' 8"  (1.727 m), weight 118.2 kg, last menstrual period 06/28/2015, SpO2 94 %. PHYSICAL EXAMINATION:  Physical Exam  GENERAL:  53 y.o.-year-old patient sitting up in bed with no acute distress.  EYES: Pupils equal, round, reactive to light and accommodation. No scleral icterus. Extraocular muscles intact.  HEENT: Head atraumatic, normocephalic. Oropharynx and nasopharynx clear. Moist mucous membranes NECK:  Supple, no jugular venous distention. No thyroid enlargement, no tenderness.   LUNGS: Normal breath sounds bilaterally, no wheezing, rales,rhonchi or crepitation. No use of accessory muscles of respiration.  CARDIOVASCULAR: RRR, S1, S2 normal. No murmurs, rubs, or gallops.  ABDOMEN: Soft, nontender, nondistended. Bowel sounds present. No  organomegaly or mass.  EXTREMITIES: No pedal edema, cyanosis, or clubbing.  NEUROLOGIC: Cranial nerves II through XII are intact. Muscle strength 5/5 in all extremities. Sensation intact. Gait not checked.  PSYCHIATRIC: The patient is alert and oriented x 3.  SKIN: No obvious rash, lesion, or ulcer.  LABORATORY PANEL:  Female CBC Recent Labs  Lab 05/31/18 0917  WBC 10.8*  HGB 12.6  HCT 36.0  PLT 172   ------------------------------------------------------------------------------------------------------------------ Chemistries  Recent Labs  Lab 05/29/18 1948  05/31/18 0203  NA 133*   < > 138  K 4.8   < > 3.3*  CL 96*   < > 110  CO2 <7*   < > 19*  GLUCOSE 782*   < > 156*  BUN 22*   < > 19  CREATININE 1.62*   < > 0.84  CALCIUM 8.5*   < > 8.4*  AST 21  --   --   ALT 17  --   --   ALKPHOS 120  --   --   BILITOT 1.2  --   --    < > = values in this interval not displayed.   RADIOLOGY:  No results found. ASSESSMENT AND PLAN:   Uncontrolled type 2 diabetes-DKA has resolved.  Off insulin drip since 4am this morning. -Continue Lantus 30 units nightly and moderate sliding scale -Check A1c with morning labs  Leukocytosis- initially came in meeting sepsis criteria with tachycardia and leukocytosis.  Thought to be secondary to a possible gluteal abscess.  Sepsis has been ruled out with negative infectious work-up. Leukocytosis likely just due to stress response from DKA. -Seen by surgery, who do not feel that she has a true abscess -  Will stop vancomycin today -Check CBC in the morning  Chronic hypothyroidism-stable -Continue home Synthroid  Hyperlipidemia-stable -Continue home Lipitor  All the records are reviewed and case discussed with Care Management/Social Worker. Management plans discussed with the patient, family and they are in agreement.  CODE STATUS: Full Code  TOTAL TIME TAKING CARE OF THIS PATIENT: 40 minutes.   More than 50% of the time was spent in  counseling/coordination of care: YES  POSSIBLE D/C tomorrow, DEPENDING ON CLINICAL CONDITION.   Berna Spare [place] Agent M.D on 05/31/2018 at 12:00 PM  Between 7am to 6pm - Pager - [phone]  After 6pm go to [email or web address] - Proofreader  [facility]  Office  [phone]  CC: Primary care physician; Casilda Carls, MD  Note: This dictation was prepared with Dragon dictation along with smaller phrase technology. Any transcriptional errors that result from this process are unintentional.

## 2018-05-31 NOTE — Progress Notes (Signed)
Pt reports she just "feels bad" becoming tearful with emotional support given. Reported nausea while trying to eat lunch with Phenergan with pt sleeping post medication. Maalox order activated if pt needs for indigestion per her request. POCT glucose checks rising with SSI coverage done.

## 2018-06-01 LAB — CBC
HCT: 35.7 % — ABNORMAL LOW (ref 36.0–46.0)
Hemoglobin: 12.5 g/dL (ref 12.0–15.0)
MCH: 32.2 pg (ref 26.0–34.0)
MCHC: 35 g/dL (ref 30.0–36.0)
MCV: 92 fL (ref 80.0–100.0)
Platelets: 165 10*3/uL (ref 150–400)
RBC: 3.88 MIL/uL (ref 3.87–5.11)
RDW: 12.3 % (ref 11.5–15.5)
WBC: 8 10*3/uL (ref 4.0–10.5)
nRBC: 0 % (ref 0.0–0.2)

## 2018-06-01 LAB — BASIC METABOLIC PANEL
Anion gap: 6 (ref 5–15)
BUN: 16 mg/dL (ref 6–20)
CO2: 24 mmol/L (ref 22–32)
Calcium: 8.6 mg/dL — ABNORMAL LOW (ref 8.9–10.3)
Chloride: 111 mmol/L (ref 98–111)
Creatinine, Ser: 0.86 mg/dL (ref 0.44–1.00)
GFR calc Af Amer: >60 mL/min (ref >60)
GFR calc non Af Amer: >60 mL/min (ref >60)
Glucose, Bld: 140 mg/dL — ABNORMAL HIGH (ref 70–99)
Potassium: 3.2 mmol/L — ABNORMAL LOW (ref 3.5–5.1)
Sodium: 141 mmol/L (ref 135–145)

## 2018-06-01 LAB — GLUCOSE, CAPILLARY
Glucose-Capillary: 100 mg/dL — ABNORMAL HIGH (ref 70–99)
Glucose-Capillary: 130 mg/dL — ABNORMAL HIGH (ref 70–99)
Glucose-Capillary: 148 mg/dL — ABNORMAL HIGH (ref 70–99)
Glucose-Capillary: 280 mg/dL — ABNORMAL HIGH (ref 70–99)
Glucose-Capillary: 292 mg/dL — ABNORMAL HIGH (ref 70–99)
Glucose-Capillary: 54 mg/dL — ABNORMAL LOW (ref 70–99)
Glucose-Capillary: 54 mg/dL — ABNORMAL LOW (ref 70–99)
Glucose-Capillary: 56 mg/dL — ABNORMAL LOW (ref 70–99)
Glucose-Capillary: >600 mg/dL (ref 70–99)
Glucose-Capillary: >600 mg/dL (ref 70–99)

## 2018-06-01 LAB — LACTIC ACID, PLASMA
Lactic Acid, Venous: 0.7 mmol/L (ref 0.5–1.9)
Lactic Acid, Venous: 0.8 mmol/L (ref 0.5–1.9)

## 2018-06-01 LAB — LIPASE, BLOOD: Lipase: 19 U/L (ref 11–51)

## 2018-06-01 LAB — HEMOGLOBIN A1C
Hgb A1c MFr Bld: 11.5 % — ABNORMAL HIGH (ref 4.8–5.6)
Mean Plasma Glucose: 283.35 mg/dL

## 2018-06-01 LAB — MAGNESIUM: Magnesium: 2 mg/dL (ref 1.7–2.4)

## 2018-06-01 MED ORDER — SENNOSIDES-DOCUSATE SODIUM 8.6-50 MG PO TABS
1.0000 | ORAL_TABLET | Freq: Two times a day (BID) | ORAL | Status: DC | PRN
  Administered 2018-06-01: 04:00:00 1 via ORAL
  Filled 2018-06-01: qty 1

## 2018-06-01 MED ORDER — INSULIN GLARGINE 100 UNIT/ML ~~LOC~~ SOLN
30.0000 [IU] | Freq: Every day | SUBCUTANEOUS | Status: DC
  Administered 2018-06-01: 10:00:00 30 [IU] via SUBCUTANEOUS
  Filled 2018-06-01 (×2): qty 0.3

## 2018-06-01 MED ORDER — PANTOPRAZOLE SODIUM 40 MG PO TBEC: 40.0000 mg | DELAYED_RELEASE_TABLET | Freq: Two times a day (BID) | ORAL | 0 refills | Status: DC

## 2018-06-01 MED ORDER — POTASSIUM CHLORIDE 20 MEQ PO PACK
40.0000 meq | PACK | ORAL | Status: AC
  Administered 2018-06-01: 40 meq via ORAL
  Filled 2018-06-01: qty 2

## 2018-06-01 MED ORDER — INSULIN GLARGINE 300 UNIT/ML ~~LOC~~ SOPN: 40.0000 [IU] | PEN_INJECTOR | Freq: Every morning | SUBCUTANEOUS | Status: DC

## 2018-06-01 MED ORDER — MAGIC MOUTHWASH
5.0000 mL | Freq: Two times a day (BID) | ORAL | Status: DC | PRN
  Administered 2018-06-01: 5 mL via ORAL
  Filled 2018-06-01: qty 10

## 2018-06-01 MED ORDER — HUMALOG KWIKPEN 100 UNIT/ML ~~LOC~~ SOPN: 15.0000 [IU] | PEN_INJECTOR | Freq: Three times a day (TID) | SUBCUTANEOUS | 1 refills | Status: DC

## 2018-06-01 MED ORDER — NYSTATIN 100000 UNIT/ML MT SUSP
5.0000 mL | Freq: Four times a day (QID) | OROMUCOSAL | Status: DC
  Administered 2018-06-01 (×2): 500000 [IU] via ORAL
  Filled 2018-06-01 (×2): qty 5

## 2018-06-01 MED ORDER — POTASSIUM CHLORIDE CRYS ER 20 MEQ PO TBCR
40.0000 meq | EXTENDED_RELEASE_TABLET | ORAL | Status: DC
  Administered 2018-06-01: 40 meq via ORAL
  Filled 2018-06-01: qty 2

## 2018-06-01 MED ORDER — INSULIN GLARGINE 100 UNIT/ML ~~LOC~~ SOLN: 30.0000 [IU] | Freq: Every day | SUBCUTANEOUS | Status: DC

## 2018-06-01 MED ORDER — NYSTATIN 100000 UNIT/ML MT SUSP: 5.0000 mL | Freq: Four times a day (QID) | OROMUCOSAL | 0 refills | Status: AC

## 2018-06-01 MED ORDER — DEXTROSE 50 % IV SOLN: 1.0000 | INTRAVENOUS | Status: DC | PRN

## 2018-06-01 MED ORDER — LIDOCAINE VISCOUS HCL 2 % MT SOLN
15.0000 mL | OROMUCOSAL | Status: DC | PRN
  Administered 2018-06-01: 15:00:00 15 mL via OROMUCOSAL
  Filled 2018-06-01 (×4): qty 15

## 2018-06-01 MED ORDER — PANTOPRAZOLE SODIUM 40 MG PO TBEC
40.0000 mg | DELAYED_RELEASE_TABLET | Freq: Two times a day (BID) | ORAL | Status: DC
  Administered 2018-06-01: 08:00:00 40 mg via ORAL
  Filled 2018-06-01: qty 1

## 2018-06-01 NOTE — Progress Notes (Signed)
Inpatient Diabetes Program Recommendations  AACE/ADA: New Consensus Statement on Inpatient Glycemic Control (2015)  Target Ranges:  Prepandial:   less than 140 mg/dL      Peak postprandial:   less than 180 mg/dL (1-2 hours)      Critically ill patients:  140 - 180 mg/dL   Lab Results  Component Value Date   GLUCAP 148 (H) 06/01/2018   HGBA1C 11.5 (H) 06/01/2018    Review of Glycemic Control Results for Tanya Graves, Tanya Graves (MRN [id number]) as of 06/01/2018 11:18  Ref. Range 05/31/2018 21:08 05/31/2018 21:56 06/01/2018 00:38 06/01/2018 04:20 06/01/2018 04:21 06/01/2018 04:23 06/01/2018 04:57 06/01/2018 06:31 06/01/2018 07:27  Glucose-Capillary Latest Ref Range: 70 - 99 mg/dL 439 (H) 406 (H) 280 (H) 56 (L) 54 (L) 54 (L) 100 (H) 130 (H) 148 (H)   Diabetes history: DM 2 per patient (she was off insulin for a while but then restarted) Outpatient Diabetes medications:  Toujeo 40 units q HS, Humalog 0-15 units tid with meals 100-150 mg/dL- 4 units, 151-200 mg/dL-6 units, 201-250 mg/dL-8 units, 251-300 mg/dL-10 units, 301-350 mg/dL-12 units, 351-400 mg/dL-14 units, >401 mg/dL- 15 units Current orders for Inpatient glycemic control:  Lantus 30 units daily, Novolog moderate tid with meals and HS, Novolog 2 units tid with meals  Inpatient Diabetes Program Recommendations:    Note low blood sugar this AM.  Per documentation, patient did receive Regular insulin SQ at 2301 for blood sugar of 406 mg/dL.  This likely contributed to low blood sugar this AM.  Discussed with MD.  She has placed order for patient to receive Lantus 30 units this AM prior to d/c home.  Talked to patient at length regarding DM, DKA, sick day rules, monitoring and DM survival skills.  Discussed A1C of 11.5% which is an average blood sugar of approximately 283 mg/dL.  According to patient her home blood sugars do not match this A1C.  She states that she often has shaky spells and low blood sugars?  We discussed hypoglycemia treatment and prevention.  She  is currently only testing before meals and not at bedtime.  It may be useful to have CGM to determine 24 hour trends of blood sugars.  Patient has PCP but is very interested in seeing Dr. Buddy Duty in [place].  States that she has requested her records however they have not been sent to his office.  She plans to discuss this with her PCP on Wednesday again.  She also plans to discuss potential for CGM with her PCP.  Patient very tearful stating that she really tries and that people do not understand.  She wants to go see CDE as well to improve her management. We discussed cause of DKA and need for more intensive testing/management when she is sick.  Sick day rules also attached to D/C instructions.  She verbalized understanding and was appreciative of my visit.   Thanks , Adah Perl, RN, BC-ADM Inpatient Diabetes Coordinator Pager [phone] (8a-5p)

## 2018-06-01 NOTE — Progress Notes (Addendum)
Pnt's  blood sugar at 2108 was 439; notified Dr Jerelyn Charles who then ordered 30 units of Regular Insulin. Administered 30 units. Spot checked pnt at 0420 and found pnt to be hypoglycemic at Blood Sugar of 56, repeat 0423 which was 54. Gave (1) 4 oz juice which is 115 ml's and pnt repeat BS was 100 at 0457. Spot check of blood sugar again at 0630, blood sugar was 130. Notified Sridharan of hypoglycemic event. Also leaving message for diabetes educator to review orders.   Moved Lantus time due to hypoglycemic event. Will relay to day nurse to report to primary doctor.

## 2018-06-01 NOTE — Progress Notes (Signed)
Notified diabetes educator to review insulin orders and of hypoglycemic event overnight. Educator suggested that we talk to pharmacy but that maybe lantus could be given at 10am this am.  Contacted pharmacy as well to let them know of the hyper/hypoglycemic events and times. Asked pharmacist if he could let me know if lantus could be given at 10am? Informed him that due to initial re-timing of lantus and hyperglycemic and hypoglycemic times, pnt did not receive any lantus. Instructed by Dr. Jerelyn Charles to give both the regular and lantus however spot check confirmed a Blood sugar of 54.  Pharmacy said that he would not given 1/5  Bedtime dose of lantus and he  restart lantus to be given this evening and that novolog coverage would be sufficient. Notified day nurse Gerald Stabs of modification of order.

## 2018-06-01 NOTE — Progress Notes (Signed)
Received MD order to discharge patient to home, reviewed homes meds, prescriptions, follow up appointments and discharge instructions with patient and patient verbalized understanding

## 2018-06-01 NOTE — Discharge Summary (Signed)
[facility] NAME: Tanya Graves    MR#:  [id number]  DATE OF BIRTH:  1965/12/02  DATE OF ADMISSION:  05/29/2018   ADMITTING PHYSICIAN: Gorden Harms, MD  DATE OF DISCHARGE: 06/01/18  PRIMARY CARE PHYSICIAN: Casilda Carls, MD   ADMISSION DIAGNOSIS:  Acidosis, lactic [E87.2] Diabetic ketoacidosis without coma associated with other specified diabetes mellitus ([place]) [E13.10] Sepsis, due to unspecified organism, unspecified whether acute organ dysfunction present ([place]) [A41.9] DISCHARGE DIAGNOSIS:  Active Problems:   DKA (diabetic ketoacidoses) ([place])  SECONDARY DIAGNOSIS:   Past Medical History:  Diagnosis Date  . Arthritis   . Diabetes mellitus without complication ([place])   . Thyroid disease    HOSPITAL COURSE:   Tanya Graves is a 53 year old female who presented to the ED with nausea, vomiting, abdominal pain, generalized weakness and fatigue.  In the ED, she was found to be in acute DKA with a pH of 6.9 and blood sugar of 700.  She is started on insulin drip and was admitted for further management.  Uncontrolled type 2 diabetes- A1c 11.5 this admission -Initially in DKA, which resolved with insulin drip -Discharged home on Lantus 40 units daily and Novolog 15 units with meals per DM coordinator recommendations  Epigastric pain- felt to be secondary to GERD -Lipase, LFTs, bili all normal -No chest pain to suggest cardiac etiology -Started on protonix 40mg  bid -H. Pylori serology ordered and was pending at the time of discharge  Thrush-noted on the day of discharge -Start nystatin oral suspension 4 times daily for 7 days  Leukocytosis- initially came in meeting sepsis criteria with tachycardia and leukocytosis.  Thought to be secondary to a possible gluteal abscess.  Sepsis was ruled out with negative infectious work-up. Leukocytosis likely just due to stress response from DKA. -Seen by surgery, who do not feel that she  has a true abscess -Initially on vancomycin, but this was discontinued  -WBC returned to normal on the day of discharge  Chronic hypothyroidism-stable -Continued home Synthroid  Hyperlipidemia-stable -Continued home Lipitor  DISCHARGE CONDITIONS:  Uncontrolled type 2 diabetes Epigastric pain Chronic hypothyroidism Hyperlipidemia CONSULTS OBTAINED:  CCM DRUG ALLERGIES:   Allergies  Allergen Reactions  . Corticosteroids Other (See Comments)    Elevate blood sugar   DISCHARGE MEDICATIONS:   Allergies as of 06/01/2018      Reactions   Corticosteroids Other (See Comments)   Elevate blood sugar      Medication List    STOP taking these medications   HUMALOG KWIKPEN 100 UNIT/ML KwikPen Generic drug:  insulin lispro     TAKE these medications   albuterol 108 (90 Base) MCG/ACT inhaler Commonly known as:  PROVENTIL HFA;VENTOLIN HFA Inhale 1-2 puffs into the lungs every 6 (six) hours as needed for wheezing or shortness of breath.   atorvastatin 10 MG tablet Commonly known as:  LIPITOR Take 10 mg by mouth daily.   cetirizine 10 MG tablet Commonly known as:  ZYRTEC Take 10 mg by mouth daily.   insulin aspart 100 UNIT/ML injection Commonly known as:  novoLOG Inject 15 Units into the skin 3 (three) times daily before meals. sliding scale   Insulin Glargine 300 UNIT/ML Sopn Commonly known as:  TOUJEO SOLOSTAR Inject 46 Units into the skin every morning.   levothyroxine 175 MCG tablet Commonly known as:  SYNTHROID, LEVOTHROID Take 1 tablet by mouth daily before breakfast.   metFORMIN 500 MG tablet Commonly known as:  GLUCOPHAGE Take 1,000  mg by mouth 2 (two) times daily with a meal.   nystatin 100000 UNIT/ML suspension Commonly known as:  MYCOSTATIN Take 5 mLs (500,000 Units total) by mouth 4 (four) times daily for 7 days.   pantoprazole 40 MG tablet Commonly known as:  PROTONIX Take 1 tablet (40 mg total) by mouth 2 (two) times daily.   Vitamin D  (Ergocalciferol) 1.25 MG (50000 UT) Caps capsule Commonly known as:  DRISDOL Take 50,000 Units by mouth every 7 (seven) days. Every Sunday        DISCHARGE INSTRUCTIONS:  1.  Follow-up with PCP in 5 days 2.  Needs continued diabetes education as an outpatient 3.  Follow-up H. pylori serologies and epigastric pain 4.  Started on Protonix 40 mg twice daily this admission DIET:  Diabetic diet DISCHARGE CONDITION:  Stable ACTIVITY:  Activity as tolerated OXYGEN:  Home Oxygen: No.  Oxygen Delivery: room air DISCHARGE LOCATION:  home   If you experience worsening of your admission symptoms, develop shortness of breath, life threatening emergency, suicidal or homicidal thoughts you must seek medical attention immediately by calling 911 or calling your MD immediately  if symptoms less severe.  You Must read complete instructions/literature along with all the possible adverse reactions/side effects for all the Medicines you take and that have been prescribed to you. Take any new Medicines after you have completely understood and accpet all the possible adverse reactions/side effects.   Please note  You were cared for by a hospitalist during your hospital stay. If you have any questions about your discharge medications or the care you received while you were in the hospital after you are discharged, you can call the unit and asked to speak with the hospitalist on call if the hospitalist that took care of you is not available. Once you are discharged, your primary care physician will handle any further medical issues. Please note that NO REFILLS for any discharge medications will be authorized once you are discharged, as it is imperative that you return to your primary care physician (or establish a relationship with a primary care physician if you do not have one) for your aftercare needs so that they can reassess your need for medications and monitor your lab values.    On the day of  Discharge:  VITAL SIGNS:  Blood pressure 125/70, pulse 79, temperature 98.5 F (36.9 C), temperature source Oral, resp. rate 20, height 5\' 8"  (1.727 m), weight 118.2 kg, last menstrual period 06/28/2015, SpO2 97 %. PHYSICAL EXAMINATION:  GENERAL:  53 y.o.-year-old patient lying in the bed with no acute distress.  Well-appearing. EYES: Pupils equal, round, reactive to light and accommodation. No scleral icterus. Extraocular muscles intact.  HEENT: Head atraumatic, normocephalic.  Small amount of white material present on the roof of the mouth.  Moist mucous membranes. NECK:  Supple, no jugular venous distention. No thyroid enlargement, no tenderness.  LUNGS: Normal breath sounds bilaterally, no wheezing, rales,rhonchi or crepitation. No use of accessory muscles of respiration.  CARDIOVASCULAR: S1, S2 normal. No murmurs, rubs, or gallops.  ABDOMEN: Soft, non-tender, non-distended. Bowel sounds present. No organomegaly or mass.  EXTREMITIES: No pedal edema, cyanosis, or clubbing.  NEUROLOGIC: Cranial nerves II through XII are intact. Muscle strength 5/5 in all extremities. Sensation intact. Gait not checked.  PSYCHIATRIC: The patient is alert and oriented x 3.  SKIN: No obvious rash, lesion, or ulcer.  DATA REVIEW:   CBC Recent Labs  Lab 06/01/18 0306  WBC 8.0  HGB 12.5  HCT 35.7*  PLT 165    Chemistries  Recent Labs  Lab 05/29/18 1948  06/01/18 0306 06/01/18 0800  NA 133*   < > 141  --   K 4.8   < > 3.2*  --   CL 96*   < > 111  --   CO2 <7*   < > 24  --   GLUCOSE 782*   < > 140*  --   BUN 22*   < > 16  --   CREATININE 1.62*   < > 0.86  --   CALCIUM 8.5*   < > 8.6*  --   MG  --   --   --  2.0  AST 21  --   --   --   ALT 17  --   --   --   ALKPHOS 120  --   --   --   BILITOT 1.2  --   --   --    < > = values in this interval not displayed.     Microbiology Results  Results for orders placed or performed during the hospital encounter of 05/29/18  Blood culture (routine  x 2)     Status: None (Preliminary result)   Collection Time: 05/29/18  7:48 PM  Result Value Ref Range Status   Specimen Description BLOOD RIGHT FOOT  Final   Special Requests   Final    BOTTLES DRAWN AEROBIC AND ANAEROBIC Blood Culture adequate volume   Culture   Final    NO GROWTH 2 DAYS Performed at [facility], [address] [place], [place] [address]    Report Status PENDING  Incomplete  Blood culture (routine x 2)     Status: None (Preliminary result)   Collection Time: 05/29/18  7:48 PM  Result Value Ref Range Status   Specimen Description BLOOD LEFT AC  Final   Special Requests   Final    BOTTLES DRAWN AEROBIC AND ANAEROBIC Blood Culture results may not be optimal due to an excessive volume of blood received in culture bottles   Culture   Final    NO GROWTH 2 DAYS Performed at [facility], [address], [place], [place] [address]    Report Status PENDING  Incomplete  MRSA PCR Screening     Status: None   Collection Time: 05/29/18 11:39 PM  Result Value Ref Range Status   MRSA by PCR NEGATIVE NEGATIVE Final    Comment:        The GeneXpert MRSA Assay (FDA approved for NASAL specimens only), is one component of a comprehensive MRSA colonization surveillance program. It is not intended to diagnose MRSA infection nor to guide or monitor treatment for MRSA infections. Performed at [facility], [address], [place], [place] [address]     RADIOLOGY:  No results found.   Management plans discussed with the patient, family and they are in agreement.  CODE STATUS: Full Code   TOTAL TIME TAKING CARE OF THIS PATIENT: 35 minutes.    Berna Spare Arcadia Gorgas M.D on 06/01/2018 at 9:19 AM  Between 7am to 6pm - Pager - [phone]  After 6pm go to [email or web address] - Proofreader  [facility]  Office  [phone]  CC: Primary care physician; Casilda Carls, MD   Note: This dictation was prepared with Dragon  dictation along with smaller phrase technology. Any transcriptional errors that result from this process are unintentional.

## 2018-06-01 NOTE — Discharge Instructions (Signed)
It was so nice to meet you during this hospitalization!  You came into the hospital with DKA (diabetic ketoacidosis). Your blood sugars were very high. We put you on an insulin drip to help get your blood sugars down. I have not made any changes to your home insulin. You should follow-up with your primary care doctor for any additional changes.   I have prescribed some Protonix 40mg  twice a day to help with the heartburn. I have also ordered testing for H. Pylori, which is a bacterial infection of the stomach that can sometimes cause significant heartburn and upper abdominal pain. Your primary care doctor should follow-up with this.  I have also prescribed some nystatin for possible thrush. You should use this 4 times a day for the next 7 days.  Take care, Dr. Brett Albino

## 2018-06-02 LAB — H PYLORI, IGM, IGG, IGA AB
H Pylori IgG: 0.31 Index Value (ref 0.00–0.79)
H. Pylogi, Iga Abs: <9 units (ref 0.0–8.9)
H. Pylogi, Igm Abs: <9 units (ref 0.0–8.9)

## 2018-06-03 LAB — CULTURE, BLOOD (ROUTINE X 2)
Culture: NO GROWTH
Culture: NO GROWTH
Special Requests: ADEQUATE

## 2018-06-05 ENCOUNTER — Telehealth: Payer: Self-pay

## 2018-06-05 NOTE — Telephone Encounter (Signed)
EMMI Follow-up: Noted on the report that the patient wanted to know who to contact if there was a change in her condition and other questions.  I talked with Tanya Graves and she had gone to her follow-up appointment with Dr. Rosario Jacks and that didn't go well. Said he refused to write Rx for her insulin even though she was just about out and it ended up that Dr. Brett Albino wrote her a 30-day Rx for her insulin.  Her and PCP to part ways and she asked for a office number for Dr. Brett Albino (I looked online and found [phone]) and gave to Tanya Graves.  I let her know there would be a second automated call with a different series of questions and to let us know if she had any other concerns.  She thanked me for my help.

## 2018-06-27 ENCOUNTER — Other Ambulatory Visit: Payer: Self-pay | Admitting: Internal Medicine

## 2018-06-29 ENCOUNTER — Other Ambulatory Visit: Payer: Self-pay | Admitting: Internal Medicine

## 2018-07-08 ENCOUNTER — Encounter: Payer: Self-pay | Admitting: Internal Medicine

## 2018-07-08 ENCOUNTER — Ambulatory Visit (INDEPENDENT_AMBULATORY_CARE_PROVIDER_SITE_OTHER): Payer: BLUE CROSS/BLUE SHIELD | Admitting: Internal Medicine

## 2018-07-08 VITALS — BP 134/86 | HR 96 | Resp 12 | Ht 68.0 in | Wt 267.0 lb

## 2018-07-08 DIAGNOSIS — E1165 Type 2 diabetes mellitus with hyperglycemia: Secondary | ICD-10-CM | POA: Diagnosis not present

## 2018-07-08 MED ORDER — INSULIN PEN NEEDLE 32G X 6 MM MISC: 3 refills | Status: DC

## 2018-07-08 MED ORDER — METFORMIN HCL 1000 MG PO TABS: 1000.0000 mg | ORAL_TABLET | Freq: Two times a day (BID) | ORAL | 3 refills | Status: DC

## 2018-07-08 MED ORDER — GLUCOSE BLOOD VI STRP: ORAL_STRIP | 12 refills | Status: DC

## 2018-07-08 MED ORDER — NOVOLOG FLEXPEN 100 UNIT/ML ~~LOC~~ SOPN: 10.0000 [IU] | PEN_INJECTOR | Freq: Three times a day (TID) | SUBCUTANEOUS | 3 refills | Status: DC

## 2018-07-08 MED ORDER — INSULIN GLARGINE (2 UNIT DIAL) 300 UNIT/ML ~~LOC~~ SOPN: 36.0000 [IU] | PEN_INJECTOR | Freq: Every day | SUBCUTANEOUS | 3 refills | Status: DC

## 2018-07-08 MED ORDER — ATORVASTATIN CALCIUM 10 MG PO TABS: 10.0000 mg | ORAL_TABLET | Freq: Every day | ORAL | 6 refills | Status: DC

## 2018-07-08 MED ORDER — LEVOTHYROXINE SODIUM 175 MCG PO TABS: 175.0000 ug | ORAL_TABLET | Freq: Every day | ORAL | 3 refills | Status: DC

## 2018-07-08 NOTE — Progress Notes (Signed)
Name: Tanya Graves  MRN/ DOB: [id number], 1965-06-16   Age/ Sex: 52 y.o., female    PCP: Patient, No Pcp Per   Reason for Endocrinology Evaluation: Type 2 Diabetes Mellitus     Date of Initial Endocrinology Visit: 07/08/2018     PATIENT IDENTIFIER: Tanya Graves is a 53 y.o. female with a past medical history of T2DM, HTN, Dyslipidemia and hypothyroidism. The patient presented for initial endocrinology clinic visit on 07/08/2018 for consultative assistance with her diabetes management.    HPI: Tanya Graves was    Diagnosed with T2DM ~ 10 yrs Prior Medications tried/Intolerance: no Currently checking blood sugars 4 x / day,  before meals Hypoglycemia episodes :yes               Symptoms: dizzy                 Frequency: 3/ week Hemoglobin A1c has ranged from 11.4%  in 2016, peaking at 12.5% in 2019 Patient required assistance for hypoglycemia: no Patient has required hospitalization within the last 1 year from hyper or hypoglycemia: Yes, 05/2018 with gastroenteritis and DKA   In terms of diet, the patient historically has been nonadherent to a low-carb diet, but since her admission in January 2020 to the hospital she has been avoiding all sugar sweetened beverages, apparently during this hospitalization she has been told to eat multiple small meals a day so she has been eating 4-5 times a day checking her sugar and giving herself NovoLog with every meal.      Thyroid Disease  She is S/P RAI ablation years ago > 20 yrs ago and has been on levothyroxine . She is on Levothyroxine 175 mcg daily   HOME DIABETES REGIMEN: Toujeo 40 units QAM  Novolog SS 1-15 units per meal  Metformin 1000 mg daily    Statin: Yes ACE-I/ARB: No Prior Diabetic Education:Yes   GLUCOSE CONTROL: Date  Breakfast  Lunch  Dinner  bedtime  07/08/18 242 374    2/11 116/ 321  393   2/10 135  601 409 / MN 185  2/8 328  508   2/6  15:50 AM 50/62 199   199  2/5 322   255    DIABETIC  COMPLICATIONS: Microvascular complications:   Neuropathy   Denies: retinopthy , CKD   Last eye exam: Completed 07/2017  Macrovascular complications:    Denies: CAD, PVD, CVA   PAST HISTORY: Past Medical History:  Past Medical History:  Diagnosis Date  . Arthritis   . Diabetes mellitus without complication ([place])   . Thyroid disease    Past Surgical History:  Past Surgical History:  Procedure Laterality Date  . APPENDECTOMY  1993  . CALCANEAL OSTEOTOMY Left 07/17/2016   Procedure: CALCANEAL OSTEOTOMY;  Surgeon: [name], MD;  Location: [facility] ORS;  Service: Orthopedics;  Laterality: Left;  . IRRIGATION AND DEBRIDEMENT ABSCESS N/A 01/24/2015   Procedure: IRRIGATION AND DEBRIDEMENT ABSCESS/GROIN ABSCESS;  Surgeon: [name], MD;  Location: [facility] ORS;  Service: General;  Laterality: N/A;  . KNEE ARTHROSCOPY W/ MENISCAL REPAIR Right 2008  . ROTATOR CUFF REPAIR Right 2015  . TONSILLECTOMY  1998      Social History:  reports that she has been smoking cigarettes. She has been smoking about 0.25 packs per day. She has never used smokeless tobacco. She reports current alcohol use. She reports that she does not use drugs. Family History:  Family History  Problem Relation Age of Onset  .  Diabetes Other   . Diabetes Father   . Heart failure Father   . Diabetes Brother   . Breast cancer Neg Hx      HOME MEDICATIONS: Allergies as of 07/08/2018      Reactions   Corticosteroids Other (See Comments)   Elevate blood sugar      Medication List       Accurate as of July 08, 2018  3:15 PM. Always use your most recent med list.        albuterol 108 (90 Base) MCG/ACT inhaler Commonly known as:  PROVENTIL HFA;VENTOLIN HFA Inhale 1-2 puffs into the lungs every 6 (six) hours as needed for wheezing or shortness of breath.   atorvastatin 10 MG tablet Commonly known as:  LIPITOR Take 10 mg by mouth daily.   cetirizine 10 MG tablet Commonly known as:   ZYRTEC Take 10 mg by mouth daily.   Insulin Glargine 300 UNIT/ML Sopn Inject 40 Units into the skin every morning.   levothyroxine 175 MCG tablet Commonly known as:  SYNTHROID, LEVOTHROID Take 1 tablet by mouth daily before breakfast.   metFORMIN 1000 MG tablet Commonly known as:  GLUCOPHAGE   NOVOLOG FLEXPEN 100 UNIT/ML FlexPen Generic drug:  insulin aspart   pantoprazole 40 MG tablet Commonly known as:  PROTONIX Take 1 tablet (40 mg total) by mouth 2 (two) times daily.   Vitamin D (Ergocalciferol) 1.25 MG (50000 UT) Caps capsule Commonly known as:  DRISDOL Take 50,000 Units by mouth every 7 (seven) days. Every Sunday        ALLERGIES: Allergies  Allergen Reactions  . Corticosteroids Other (See Comments)    Elevate blood sugar     REVIEW OF SYSTEMS: A comprehensive ROS was conducted with the patient and is negative except as per HPI and below:  Review of Systems  Constitutional: Positive for malaise/fatigue. Negative for weight loss.  HENT: Positive for congestion. Negative for sore throat.   Eyes: Positive for blurred vision. Negative for pain.  Respiratory: Positive for cough. Negative for shortness of breath.   Cardiovascular: Negative for chest pain and palpitations.  Gastrointestinal: Negative for nausea and vomiting.  Genitourinary: Positive for frequency.  Neurological: Positive for tingling. Negative for tremors.  Endo/Heme/Allergies: Positive for polydipsia.  Psychiatric/Behavioral: Negative for depression. The patient is not nervous/anxious.       OBJECTIVE:   VITAL SIGNS: BP 134/86   Pulse 96   Resp 12   Ht 5\' 8"  (1.727 m)   Wt 267 lb (121.1 kg)   LMP 06/28/2015 (Approximate)   SpO2 99%   BMI 40.60 kg/m    PHYSICAL EXAM:  General: Pt appears well and is in NAD  Hydration: Well-hydrated with moist mucous membranes and good skin turgor  HEENT: Head: Unremarkable with good dentition. Oropharynx clear without exudate.  Eyes: External eye  exam normal without stare, lid lag or exophthalmos.  EOM intact.  PERRL.  Neck: General: Supple without adenopathy or carotid bruits. Thyroid: Thyroid size normal.  No goiter or nodules appreciated. No thyroid bruit.  Lungs: Clear with good BS bilat with no rales, rhonchi, or wheezes  Heart: RRR with normal S1 and S2 and no gallops; no murmurs; no rub  Abdomen: Normoactive bowel sounds, soft, nontender, without masses or organomegaly palpable  Extremities:  Lower extremities - No pretibial edema. No lesions.  Skin: Normal texture and temperature to palpation. No rash noted. No Acanthosis nigricans/skin tags. No lipohypertrophy.  Neuro: MS is good with appropriate affect, pt is  alert and Ox3    DM foot exam: deferred   DATA REVIEWED:  Lab Results  Component Value Date   HGBA1C 11.5 (H) 06/01/2018   HGBA1C 12.5 (H) 06/13/2017   HGBA1C 11.4 (H) 01/24/2015   Lab Results  Component Value Date   CREATININE 0.86 06/01/2018    ASSESSMENT / PLAN / RECOMMENDATIONS:   1) Type 2 Diabetes Mellitus, Poorly controlled, With Neuropathic complications - Most recent A1c of 11.5 %. Goal A1c < 7.0 %.   Plan: GENERAL:  Poorly controlled diabetes is due to medication nonadherence and dietary indiscretions.  She has been motivated recently to change her lifestyle, she has been eating 4-5 meals a day and providing herself with NovoLog based on a sliding scale, patient has been stacking insulin which has been causing intermittent hypoglycemia. Discussed pharmacokinetics of basal/bolus insulin and the importance of taking prandial insulin with meals.   We also discussed avoiding sugar-sweetened beverages and snacks, when possible.  I have discussed with the patient the pathophysiology of diabetes. We went over the natural progression of the disease. We talked about both insulin resistance and insulin deficiency. We stressed the importance of lifestyle changes including diet and exercise. I explained the  complications associated with diabetes including retinopathy, nephropathy, neuropathy as well as increased risk of cardiovascular disease. We went over the benefit seen with glycemic control.    I explained to the patient that diabetic patients are at higher than normal risk for amputations. The patient was informed that diabetes is the number one cause of non-traumatic amputations in [place]. The patient was advised to look and examine their feet , wear proper fitting shoes and not to go barefoot.   Patient advised to eat 3 meals a day with NovoLog, she was advised to avoid snacking between the meals we also discussed options for low carb snacking.  She was also advised that if she has upper GI symptoms to hold eating and to hold NovoLog but she can continue with the Toujeo.  She was provided with a pamphlet for freestyle libre, we will discuss this on the next visit as we did not have time to discuss this today.  MEDICATIONS:  Increase metformin to 1000 mg twice a day with meals  Decrease Toujeo to 36 units daily  Start NovoLog 10 units with each meal  If the pre-meal glucose is over 200 add additional 2 units of NovoLog to mealtime insulin.   EDUCATION / INSTRUCTIONS:  BG monitoring instructions: Patient is instructed to check her blood sugars 4 times a day, fasting and bedtime.  Call [facility] if: BG persistently < 70 or > 300. . I reviewed the Rule of 15 for the treatment of hypoglycemia in detail with the patient. Literature supplied.   2) Diabetic complications:   Eye: Does not have known diabetic retinopathy.   Neuro/ Feet: Does have known diabetic peripheral neuropathy, based on her complaints of feet numbness.  Will perform a foot exam on next visit.  Renal: Patient does not have known baseline CKD. She is not on an ACEI/ARB at present.   3) Lipids: Patient is on a statin.    4) Hypertension: She is at goal of < 140/90 mmHg.  5) Post-Ablative  Hypothyroidism :  - Pt is clinically euthyroid. Refilled Levothyroxine today, this will be monitored by her PCP once she obtains a new one.   F/u in 3 weeks    Signed electronically by: Mack Guise, MD  [facility]  [address] [place], [place] [address] Phone: [phone] FAX: [phone]   CC: Patient, No Pcp Per No address on file Phone: None  Fax: None    Return to Endocrinology clinic as below: No future appointments.

## 2018-07-08 NOTE — Patient Instructions (Addendum)
-   Increase Metformin to 1000 mg Twice a day with meals - Decrease Toujeo to 36 units  - Take Novolog 10 units with each meal  - If your Pre-meal sugar is over 200 , add 2 more units to your meal time Novolog dose - If you have nausea/vomiting and you don't eat, do NOT take Novolog but take Toujeo  - Check sugar before each meal and at Bedtime  Choose healthy, lower carb lower calorie snacks: toss salad, cooked vegetables, cottage cheese, peanut butter, low fat cheese / string cheese, lower sodium deli meat, tuna salad or chicken salad    HOW TO TREAT LOW BLOOD SUGARS (Blood sugar LESS THAN 70 MG/DL)  Please follow the RULE OF 15 for the treatment of hypoglycemia treatment (when your (blood sugars are less than 70 mg/dL)    STEP 1: Take 15 grams of carbohydrates when your blood sugar is low, which includes:   3-4 GLUCOSE TABS  OR  3-4 OZ OF JUICE OR REGULAR SODA OR  ONE TUBE OF GLUCOSE GEL     STEP 2: RECHECK blood sugar in 15 MINUTES STEP 3: If your blood sugar is still low at the 15 minute recheck --> then, go back to STEP 1 and treat AGAIN with another 15 grams of carbohydrates.

## 2018-07-09 ENCOUNTER — Encounter: Payer: Self-pay | Admitting: Internal Medicine

## 2018-07-22 ENCOUNTER — Encounter: Payer: Self-pay | Admitting: Family Medicine

## 2018-07-22 ENCOUNTER — Ambulatory Visit (INDEPENDENT_AMBULATORY_CARE_PROVIDER_SITE_OTHER): Payer: BLUE CROSS/BLUE SHIELD | Admitting: Family Medicine

## 2018-07-22 VITALS — BP 122/80 | HR 89 | Temp 98.4°F | Ht 68.25 in | Wt 270.5 lb

## 2018-07-22 DIAGNOSIS — Z720 Tobacco use: Secondary | ICD-10-CM | POA: Insufficient documentation

## 2018-07-22 DIAGNOSIS — E785 Hyperlipidemia, unspecified: Secondary | ICD-10-CM | POA: Insufficient documentation

## 2018-07-22 DIAGNOSIS — E039 Hypothyroidism, unspecified: Secondary | ICD-10-CM | POA: Insufficient documentation

## 2018-07-22 DIAGNOSIS — E7800 Pure hypercholesterolemia, unspecified: Secondary | ICD-10-CM | POA: Insufficient documentation

## 2018-07-22 DIAGNOSIS — E1169 Type 2 diabetes mellitus with other specified complication: Secondary | ICD-10-CM | POA: Insufficient documentation

## 2018-07-22 DIAGNOSIS — Z23 Encounter for immunization: Secondary | ICD-10-CM | POA: Diagnosis not present

## 2018-07-22 DIAGNOSIS — K219 Gastro-esophageal reflux disease without esophagitis: Secondary | ICD-10-CM | POA: Insufficient documentation

## 2018-07-22 DIAGNOSIS — E1165 Type 2 diabetes mellitus with hyperglycemia: Secondary | ICD-10-CM | POA: Diagnosis not present

## 2018-07-22 DIAGNOSIS — Z1211 Encounter for screening for malignant neoplasm of colon: Secondary | ICD-10-CM

## 2018-07-22 MED ORDER — LEVOTHYROXINE SODIUM 175 MCG PO TABS: 175.0000 ug | ORAL_TABLET | Freq: Every day | ORAL | 3 refills | Status: DC

## 2018-07-22 MED ORDER — ATORVASTATIN CALCIUM 10 MG PO TABS: 10.0000 mg | ORAL_TABLET | Freq: Every day | ORAL | 3 refills | Status: DC

## 2018-07-22 MED ORDER — PANTOPRAZOLE SODIUM 40 MG PO TBEC: 40.0000 mg | DELAYED_RELEASE_TABLET | Freq: Two times a day (BID) | ORAL | 3 refills | Status: DC

## 2018-07-22 NOTE — Patient Instructions (Addendum)
#  Tobacco Smoking - try to pick a quit date -  Quit-line  [employer]  1-800-QUIT-NOW 308-246-9837)  Refilled medication today  2 shots today  Referral to GI for colonoscopy  #Protonix - this is for heartburn like symptoms - acid reflux - can take for 8 weeks - if symptoms return when you stop, then you can restart the medication

## 2018-07-22 NOTE — Assessment & Plan Note (Signed)
Refill provided. Will get outside records for most recent TSH

## 2018-07-22 NOTE — Progress Notes (Signed)
Subjective:     Tanya Graves is a 53 y.o. female presenting for Establish Care (previous PCP Dr. Delos Haring in [place] for diabetes); Medication Refill; and Nicotine Dependence     HPI  #hypothyroidism - last TSH checked in November - as far as she knows it was normal - has been on 175 mcg for a while - feels like it is a good balance  #Tobacco use - smoking 1/4 ppd - will occasionally go a day or 2 without smoking - has quit in the past - will use nicotine gum occasionally  - smokes less in the winter time - does get anxious - patches irritate her skin - has not tried the lozanges - tries to make a deal with herself to limit  - quit for a duration of 3 years in the past  #obesity - had lost from 363 with weight watchers  -   #Protonix - went off the medication for a short time  Review of Systems  No fever/chills No sob, wheezing  1/3-10/2018: Admission: DKA resolved with insulin 07/08/2018: Endocrine - metformin increased, decreased toujeo, and start novolog 10 un with meals  Social History   Tobacco Use  Smoking Status Current Some Day Smoker  . Packs/day: 0.25  . Years: 30.00  . Pack years: 7.50  . Types: Cigarettes  Smokeless Tobacco Never Used        Objective:    BP Readings from Last 3 Encounters:  07/22/18 122/80  07/08/18 134/86  06/01/18 131/81   Wt Readings from Last 3 Encounters:  07/22/18 270 lb 8 oz (122.7 kg)  07/08/18 267 lb (121.1 kg)  05/31/18 260 lb 9.3 oz (118.2 kg)    BP 122/80   Pulse 89   Temp 98.4 F (36.9 C)   Ht 5' 8.25" (1.734 m)   Wt 270 lb 8 oz (122.7 kg)   LMP 06/28/2015 (Approximate)   SpO2 97%   BMI 40.83 kg/m    Physical Exam Constitutional:      General: She is not in acute distress.    Appearance: She is well-developed. She is not diaphoretic.  HENT:     Right Ear: External ear normal.     Left Ear: External ear normal.     Nose: Nose normal.  Eyes:   Conjunctiva/sclera: Conjunctivae normal.  Neck:     Musculoskeletal: Neck supple.  Cardiovascular:     Rate and Rhythm: Normal rate.  Pulmonary:     Effort: Pulmonary effort is normal.  Skin:    General: Skin is warm and dry.     Capillary Refill: Capillary refill takes less than 2 seconds.  Neurological:     Mental Status: She is alert. Mental status is at baseline.  Psychiatric:        Mood and Affect: Mood normal.        Behavior: Behavior normal.           Assessment & Plan:   Problem List Items Addressed This Visit      Digestive   GERD (gastroesophageal reflux disease)    Started in the hospital. Hx of hiatal hernia. Discussed 8 week trial and stopping to see if symptoms have resolved.       Relevant Medications   pantoprazole (PROTONIX) 40 MG tablet     Endocrine   Poorly controlled type 2 diabetes mellitus ([place])    Seeing endocrinology. Pneumonia shot today.       Relevant Medications  atorvastatin (LIPITOR) 10 MG tablet   Other Relevant Orders   Pneumococcal polysaccharide vaccine 23-valent greater than or equal to 2yo subcutaneous/IM   Hypothyroidism    Refill provided. Will get outside records for most recent TSH      Relevant Medications   levothyroxine (SYNTHROID, LEVOTHROID) 175 MCG tablet   Hyperlipidemia associated with type 2 diabetes mellitus ([place])    Refill provided. Will obtain outside labs      Relevant Medications   atorvastatin (LIPITOR) 10 MG tablet     Other   Tobacco use - Primary    Very interested in quitting, limitation is often stress. Briefly discussed welbutrin/chantix but patient will try to use nicotine replacement and the quitline as well as consider therapy for irritability.        Other Visit Diagnoses    Colon cancer screening       Relevant Orders   Ambulatory referral to Gastroenterology       Return in about 8 months (around 03/22/2019).  [name], MD

## 2018-07-22 NOTE — Assessment & Plan Note (Signed)
Seeing endocrinology. Pneumonia shot today.

## 2018-07-22 NOTE — Assessment & Plan Note (Signed)
Refill provided. Will obtain outside labs

## 2018-07-22 NOTE — Assessment & Plan Note (Signed)
Started in the hospital. Hx of hiatal hernia. Discussed 8 week trial and stopping to see if symptoms have resolved.

## 2018-07-22 NOTE — Assessment & Plan Note (Signed)
Very interested in quitting, limitation is often stress. Briefly discussed welbutrin/chantix but patient will try to use nicotine replacement and the quitline as well as consider therapy for irritability.

## 2018-07-29 ENCOUNTER — Ambulatory Visit (INDEPENDENT_AMBULATORY_CARE_PROVIDER_SITE_OTHER): Payer: BLUE CROSS/BLUE SHIELD | Admitting: Internal Medicine

## 2018-07-29 ENCOUNTER — Other Ambulatory Visit: Payer: Self-pay

## 2018-07-29 ENCOUNTER — Encounter: Payer: Self-pay | Admitting: Internal Medicine

## 2018-07-29 ENCOUNTER — Other Ambulatory Visit: Payer: Self-pay | Admitting: Family Medicine

## 2018-07-29 VITALS — BP 108/76 | HR 103 | Temp 97.9°F | Ht 68.25 in | Wt 267.2 lb

## 2018-07-29 DIAGNOSIS — E890 Postprocedural hypothyroidism: Secondary | ICD-10-CM | POA: Diagnosis not present

## 2018-07-29 DIAGNOSIS — E1165 Type 2 diabetes mellitus with hyperglycemia: Secondary | ICD-10-CM

## 2018-07-29 MED ORDER — DAPAGLIFLOZIN PROPANEDIOL 10 MG PO TABS: 10.0000 mg | ORAL_TABLET | Freq: Every day | ORAL | 6 refills | Status: DC

## 2018-07-29 MED ORDER — DAPAGLIFLOZIN PROPANEDIOL 5 MG PO TABS: 5.0000 mg | ORAL_TABLET | Freq: Every day | ORAL | 0 refills | Status: DC

## 2018-07-29 NOTE — Patient Instructions (Addendum)
-   Increase toujeo to 42 units  - Increase Novolog to 12 units, If over 200 , please take 2 additional units of Novolog  - Continue Metformin 1000 mg Twice a day  - Farxiga 5 mg once a day for 30 day, then increase to 10 mg daily    HOW TO TREAT LOW BLOOD SUGARS (Blood sugar LESS THAN 70 MG/DL)  Please follow the RULE OF 15 for the treatment of hypoglycemia treatment (when your (blood sugars are less than 70 mg/dL)    STEP 1: Take 15 grams of carbohydrates when your blood sugar is low, which includes:   3-4 GLUCOSE TABS  OR  3-4 OZ OF JUICE OR REGULAR SODA OR  ONE TUBE OF GLUCOSE GEL     STEP 2: RECHECK blood sugar in 15 MINUTES STEP 3: If your blood sugar is still low at the 15 minute recheck --> then, go back to STEP 1 and treat AGAIN with another 15 grams of carbohydrates.

## 2018-07-29 NOTE — Progress Notes (Signed)
Name: Tanya Graves  Age/ Sex: 53 y.o., female   MRN/ DOB: [id number], 04-23-1966     PCP: [name], MD   Reason for Endocrinology Evaluation: Type 2 Diabetes Mellitus  Initial Endocrine Consultative Visit: 07/08/2018    PATIENT IDENTIFIER: Tanya Graves is a 53 y.o. female with a past medical history of T2DM, HTN, Dyslipidemia and hypothyroidism . The patient has followed with Endocrinology clinic since 07/08/2018 for consultative assistance with management of her diabetes.  DIABETIC HISTORY:  Tanya Graves was diagnosed with T1DM ~ 2010.  She does not recall intolerance to other oral glycemic agents.  On her initial visit here she was on MDI regimen of insulin and metformin. Her hemoglobin A1c has ranged from 11.4%  in 2016, peaking at 12.5% in 2019   Thyroid Disease  She is S/P RAI ablation years ago > 20 yrs ago and has been on levothyroxine . She is on Levothyroxine 175 mcg daily   SUBJECTIVE:   During the last visit (07/08/2018): Her A1c was 11.5%.  When increase her metformin to 1000 mg twice daily, we decreased her Toujeo from 40 to 36 units daily, and we started her on NovoLog 10 units with each meal.  Today (07/31/2018): Tanya Graves for 2-week follow-up on diabetes management.  She checks her blood sugars 4 times daily, preprandial and bedtime. The patient has not had hypoglycemic episodes since the last clinic visit. Otherwise, the patient has not required any recent emergency interventions for hypoglycemia and has not had recent hospitalizations secondary to hyper or hypoglycemic episodes.   She has continued with low-carb diet.     ROS: As per HPI and as detailed below: Review of Systems  Respiratory: Negative for cough and shortness of breath.   Cardiovascular: Negative for chest pain and palpitations.  Gastrointestinal: Positive for nausea. Negative for constipation.    Genitourinary: Negative for frequency.  Endo/Heme/Allergies: Negative for polydipsia.      HOME DIABETES REGIMEN:      GLUCOSE LOG:  Date Breakfast Lunch  Supper Bedtime  07/09/2018 194 384 284 232  2/14 118 257 342 220  2/15 165 232 280 220  2/16 351 281 243 221  2/17 192 218 388 279  2/18 176 141 301 246  2/19 206 152 316 239  2/20 296 323 421 303  2/21 275 314 292 171  2/22 214 385 269 161  2/23 221 349 227 201  2/24 251 319 231 271  2/25 231 485 344 271  2/26 241 308 291 304     HISTORY:  Past Medical History:  Past Medical History:  Diagnosis Date  . Arthritis   . Diabetes mellitus without complication ([place])   . Thyroid disease    Past Surgical History:  Past Surgical History:  Procedure Laterality Date  . APPENDECTOMY  1993  . CALCANEAL OSTEOTOMY Left 07/17/2016   Procedure: CALCANEAL OSTEOTOMY;  Surgeon: [name], MD;  Location: [facility] ORS;  Service: Orthopedics;  Laterality: Left;  . IRRIGATION AND DEBRIDEMENT ABSCESS N/A 01/24/2015   Procedure: IRRIGATION AND DEBRIDEMENT ABSCESS/GROIN ABSCESS;  Surgeon: [name], MD;  Location: [facility] ORS;  Service: General;  Laterality: N/A;  . KNEE ARTHROSCOPY W/ MENISCAL REPAIR Right 2008  . ROTATOR CUFF REPAIR Right 2015  . TONSILLECTOMY  1998    Social History:  reports that she has been smoking cigarettes. She has a 7.50 pack-year smoking history. She has never used smokeless tobacco. She reports current alcohol use. She reports  that she does not use drugs. Family History:  Family History  Problem Relation Age of Onset  . Diabetes Father   . Heart failure Father   . Diabetes Brother   . Arthritis Mother   . Diabetes Mother   . Transient ischemic attack Mother   . Heart failure Maternal Grandmother   . Alcohol abuse Maternal Grandmother   . Diabetes Paternal Grandmother   . Kidney failure Paternal Grandmother   . Heart failure Paternal Grandfather   . Cancer Paternal Grandfather         unknown type  . Hypertension Brother   . Breast cancer Maternal Great-grandmother      HOME MEDICATIONS: Allergies as of 07/29/2018      Reactions   Corticosteroids Other (See Comments)   Elevate blood sugar      Medication List       Accurate as of July 29, 2018 11:59 PM. Always use your most recent med list.        albuterol 108 (90 Base) MCG/ACT inhaler Commonly known as:  PROVENTIL HFA;VENTOLIN HFA Inhale 1-2 puffs into the lungs every 6 (six) hours as needed for wheezing or shortness of breath.   atorvastatin 10 MG tablet Commonly known as:  LIPITOR Take 1 tablet (10 mg total) by mouth daily.   cetirizine 10 MG tablet Commonly known as:  ZYRTEC Take 10 mg by mouth daily.   dapagliflozin propanediol 5 MG Tabs tablet Commonly known as:  Farxiga Take 5 mg by mouth daily.   dapagliflozin propanediol 10 MG Tabs tablet Commonly known as:  Farxiga Take 10 mg by mouth daily.   glucose blood test strip Commonly known as:  Contour Next Test 4 times a day   Insulin Glargine (2 Unit Dial) 300 UNIT/ML Sopn Commonly known as:  Toujeo Max SoloStar Inject 36 Units into the skin daily.   Insulin Pen Needle 32G X 6 MM Misc Commonly known as:  BD Pen Needle Micro U/F 4x a day   levothyroxine 175 MCG tablet Commonly known as:  SYNTHROID, LEVOTHROID Take 1 tablet (175 mcg total) by mouth daily before breakfast.   Melatonin 10 MG Tabs Take 1 tablet by mouth at bedtime as needed.   metFORMIN 1000 MG tablet Commonly known as:  GLUCOPHAGE Take 1 tablet (1,000 mg total) by mouth 2 (two) times daily with a meal.   NovoLOG FlexPen 100 UNIT/ML FlexPen Generic drug:  insulin aspart Inject 10 Units into the skin 3 (three) times daily with meals. Max daily of 40 units   pantoprazole 40 MG tablet Commonly known as:  PROTONIX Take 1 tablet (40 mg total) by mouth 2 (two) times daily.        OBJECTIVE:   Vital Signs: BP 108/76 (BP Location: Left Arm, Patient Position:  Sitting, Cuff Size: Large)   Pulse (!) 103   Temp 97.9 F (36.6 C) (Oral)   Ht 5' 8.25" (1.734 m)   Wt 267 lb 3.2 oz (121.2 kg)   LMP 06/28/2015 (Approximate)   SpO2 98%   BMI 40.33 kg/m   Wt Readings from Last 3 Encounters:  07/29/18 267 lb 3.2 oz (121.2 kg)  07/22/18 270 lb 8 oz (122.7 kg)  07/08/18 267 lb (121.1 kg)     Exam: General: Pt appears well and is in NAD  Neck: General: Supple without adenopathy. Thyroid: Thyroid size normal.  No goiter or nodules appreciated. No thyroid bruit.  Lungs: Clear with good BS bilat with no rales, rhonchi, or  wheezes  Heart: RRR with normal S1 and S2 and no gallops; no murmurs; no rub  Abdomen: Normoactive bowel sounds, soft, nontender, without masses or organomegaly palpable  Extremities: No pretibial edema. No tremor. Normal strength and motion throughout. See detailed diabetic foot exam below.  Skin: Normal texture and temperature to palpation. No rash noted. No Acanthosis nigricans/skin tags. No lipohypertrophy.  Neuro: MS is good with appropriate affect, pt is alert and Ox3   DM foot exam: The skin of the feet is intact without sores or ulcerations. The pedal pulses are 2+ on right and 2+ on left. The sensation is intact to a screening 5.07, 10 gram monofilament bilaterally            DATA REVIEWED:  Lab Results  Component Value Date   HGBA1C 11.5 (H) 06/01/2018   HGBA1C 12.5 (H) 06/13/2017   HGBA1C 12.9 06/07/2016   Lab Results  Component Value Date   LDLCALC 139 03/17/2018   CREATININE 0.88 07/29/2018   No results found for: MICRALBCREAT  No results found for: FRUCTOSAMINE   Lab Results  Component Value Date   CHOL 232 (A) 03/17/2018   HDL 66 03/17/2018   LDLCALC 139 03/17/2018   TRIG 142 03/17/2018         ASSESSMENT / PLAN / RECOMMENDATIONS:  1)Type 2 Diabetes Mellitus, Poorly controlled, With Neuropathic complications - Most recent A1c of 11.5 %. Goal A1c < 7.0 %.   -I have placed the patient on  continue with lifestyle changes, and continued frequent blood glucose checks. -She has increased her metformin to 1000 mg twice a day without side effects, but she has been noted to continue with hyperglycemia throughout the day. -She is trying to do better with a low carb snack options. -We will add an SGLT2 inhibitor, we discussed the benefit of improving her glucose control and weight loss we also discussed side effects such as increased dental infections, or dizziness due to the diuretic effect. -He was encouraged to call with any side effects.  MEDICATIONS:  Continue metformin 1000 mg twice daily with meals  Increase Toujeo to 42 units daily  Increase NovoLog to 12 units with each meal  If the pre-meal glucose is 200mg /dL over patient to add 2 more units of NovoLog to that meal.  Farxiga 5 mg daily, for 1 month, then may increase to 10 mg daily if no side effects.  EDUCATION / INSTRUCTIONS:  BG monitoring instructions: Patient is instructed to check her blood sugars 4 times a day, before meals and bedtime.  Call [facility] if: BG persistently < 70 or > 300. . I reviewed the Rule of 15 for the treatment of hypoglycemia in detail with the patient. Literature supplied.  2.) Post-Ablative Hypothyroidism :  -She is clinically and biochemically euthyroid -Pt educated extensively on the correct way to take levothyroxine (first thing in the morning with water, 30 minutes before eating or taking other medications). - Pt encouraged to double dose the following day if she were to miss a dose given long half-life of levothyroxine.   Medication Continue levothyroxine 175 MCG daily  F/U in 6 weeks    Signed electronically by: Mack Guise, MD  [facility]  [facility] [address] [place], [place] [address] Phone: [phone] FAX: [phone]   CC: [name], [place]  [address] Phone: [phone]  Fax: [phone]  Return to Endocrinology clinic as below: Future  Appointments  Date Time Provider [place]  09/09/2018  3:40 PM Kayloni Rocco, Melanie Crazier, MD [facility] None  03/22/2019  8:00 AM [name], MD [facility]

## 2018-07-30 LAB — BASIC METABOLIC PANEL
BUN: 16 mg/dL (ref 6–23)
CO2: 24 mEq/L (ref 19–32)
Calcium: 9.5 mg/dL (ref 8.4–10.5)
Chloride: 99 mEq/L (ref 96–112)
Creatinine, Ser: 0.88 mg/dL (ref 0.40–1.20)
GFR: 67.59 mL/min (ref >60.00)
Glucose, Bld: 362 mg/dL — ABNORMAL HIGH (ref 70–99)
Potassium: 4.6 mEq/L (ref 3.5–5.1)
Sodium: 133 mEq/L — ABNORMAL LOW (ref 135–145)

## 2018-07-30 LAB — TSH: TSH: 4.11 u[IU]/mL (ref 0.35–4.50)

## 2018-07-30 LAB — T4, FREE: Free T4: 1.03 ng/dL (ref 0.60–1.60)

## 2018-07-31 MED ORDER — DEXCOM G6 TRANSMITTER MISC: 1.0000 | 3 refills | Status: DC

## 2018-07-31 MED ORDER — DEXCOM G6 SENSOR MISC: 1.0000 | 6 refills | Status: DC

## 2018-07-31 MED ORDER — DEXCOM G6 RECEIVER DEVI: 1.0000 | 0 refills | Status: DC

## 2018-08-01 ENCOUNTER — Encounter: Payer: Self-pay | Admitting: Family Medicine

## 2018-08-01 ENCOUNTER — Encounter: Payer: Self-pay | Admitting: Internal Medicine

## 2018-08-03 ENCOUNTER — Ambulatory Visit (INDEPENDENT_AMBULATORY_CARE_PROVIDER_SITE_OTHER): Payer: BLUE CROSS/BLUE SHIELD | Admitting: Obstetrics and Gynecology

## 2018-08-03 ENCOUNTER — Encounter: Payer: Self-pay | Admitting: Obstetrics and Gynecology

## 2018-08-03 VITALS — BP 118/60 | HR 131 | Ht 68.0 in | Wt 265.0 lb

## 2018-08-03 DIAGNOSIS — N764 Abscess of vulva: Secondary | ICD-10-CM | POA: Diagnosis not present

## 2018-08-03 MED ORDER — FLUCONAZOLE 150 MG PO TABS: 150.0000 mg | ORAL_TABLET | Freq: Once | ORAL | 0 refills | Status: AC

## 2018-08-03 MED ORDER — SULFAMETHOXAZOLE-TRIMETHOPRIM 800-160 MG PO TABS: 1.0000 | ORAL_TABLET | Freq: Two times a day (BID) | ORAL | 0 refills | Status: AC

## 2018-08-03 NOTE — Progress Notes (Signed)
Obstetrics & Gynecology Office Visit   Chief Complaint:  Chief Complaint  Patient presents with  . Bartholin's Cyst    x few days, hard time walking/sitting    History of Present Illness: Patient is a 53 year old caucasian female, past medical history notable for DMII, who presents with a 3 day history of right labial pain and swelling.  No inciting trauma.  She denies drainage or discharge.  The lesion is limited to the right labia with no other cutaneous lesions noted.  She has had a prior abscess in the right groin which resulted in sepsis and required I&D for management.  She denies getting similar abscesses or boils under her armpits.  Tenderness is exacerbated with day to day activities such as walking or sitting.  Review of Systems: Review of Systems  Constitutional: Negative.   Genitourinary: Negative.   Skin: Negative.      Past Medical History:  Past Medical History:  Diagnosis Date  . Arthritis   . Diabetes mellitus without complication ([place])   . Thyroid disease     Past Surgical History:  Past Surgical History:  Procedure Laterality Date  . APPENDECTOMY  1993  . CALCANEAL OSTEOTOMY Left 07/17/2016   Procedure: CALCANEAL OSTEOTOMY;  Surgeon: [name], MD;  Location: [facility] ORS;  Service: Orthopedics;  Laterality: Left;  . IRRIGATION AND DEBRIDEMENT ABSCESS N/A 01/24/2015   Procedure: IRRIGATION AND DEBRIDEMENT ABSCESS/GROIN ABSCESS;  Surgeon: [name], MD;  Location: [facility] ORS;  Service: General;  Laterality: N/A;  . KNEE ARTHROSCOPY W/ MENISCAL REPAIR Right 2008  . ROTATOR CUFF REPAIR Right 2015  . TONSILLECTOMY  1998    Gynecologic History: Patient's last menstrual period was 06/28/2015 (approximate).  Obstetric History: G1P0010  Family History:  Family History  Problem Relation Age of Onset  . Diabetes Father   . Heart failure Father   . Diabetes Brother   . Arthritis Mother   . Diabetes Mother   . Transient ischemic attack Mother    . Heart failure Maternal Grandmother   . Alcohol abuse Maternal Grandmother   . Diabetes Paternal Grandmother   . Kidney failure Paternal Grandmother   . Heart failure Paternal Grandfather   . Cancer Paternal Grandfather        unknown type  . Hypertension Brother   . Breast cancer Maternal Great-grandmother     Social History:  Social History   Socioeconomic History  . Marital status: Single    Spouse name: Not on file  . Number of children: 0  . Years of education: Associates  . Highest education level: Not on file  Occupational History  . Not on file  Social Needs  . Financial resource strain: Not hard at all  . Food insecurity:    Worry: Not on file    Inability: Not on file  . Transportation needs:    Medical: Not on file    Non-medical: Not on file  Tobacco Use  . Smoking status: Current Some Day Smoker    Packs/day: 0.25    Years: 30.00    Pack years: 7.50    Types: Cigarettes  . Smokeless tobacco: Never Used  Substance and Sexual Activity  . Alcohol use: Yes    Comment: a couple of times a month, typically less than 2 drinks  . Drug use: No  . Sexual activity: Not Currently    Birth control/protection: Post-menopausal  Lifestyle  . Physical activity:    Days per week: Not on  file    Minutes per session: Not on file  . Stress: Not on file  Relationships  . Social connections:    Talks on phone: Not on file    Gets together: Not on file    Attends religious service: Not on file    Active member of club or organization: Not on file    Attends meetings of clubs or organizations: Not on file    Relationship status: Not on file  . Intimate partner violence:    Fear of current or ex partner: Not on file    Emotionally abused: Not on file    Physically abused: Not on file    Forced sexual activity: Not on file  Other Topics Concern  . Not on file  Social History Narrative   Lives alone   Dog - Sugar   Enjoys: hiking - looking for local trails    Exercise: walking trails with dog   Diet: doing well, did weight watchers, watching carbs    Allergies:  Allergies  Allergen Reactions  . Corticosteroids Other (See Comments)    Elevate blood sugar    Medications: Prior to Admission medications   Medication Sig Start Date End Date Taking? Authorizing Provider  albuterol (PROVENTIL HFA;VENTOLIN HFA) 108 (90 Base) MCG/ACT inhaler Inhale 1-2 puffs into the lungs every 6 (six) hours as needed for wheezing or shortness of breath.   Yes [provider]  atorvastatin (LIPITOR) 10 MG tablet Take 1 tablet (10 mg total) by mouth daily. 07/22/18  Yes Cody, Jessica R, MD  cetirizine (ZYRTEC) 10 MG tablet Take 10 mg by mouth daily.   Yes [provider]  Continuous Blood Gluc Receiver (DEXCOM G6 RECEIVER) DEVI 1 Device by Does not apply route as directed. 07/31/18  Yes Shamleffer, Ibtehal Jaralla, MD  Continuous Blood Gluc Sensor (DEXCOM G6 SENSOR) MISC 1 kit by Does not apply route as directed. 07/31/18  Yes Shamleffer, Ibtehal Jaralla, MD  Continuous Blood Gluc Transmit (DEXCOM G6 TRANSMITTER) MISC 1 Device by Does not apply route as directed. 07/31/18  Yes Shamleffer, Ibtehal Jaralla, MD  dapagliflozin propanediol (FARXIGA) 10 MG TABS tablet Take 10 mg by mouth daily. 07/29/18  Yes Shamleffer, Ibtehal Jaralla, MD  dapagliflozin propanediol (FARXIGA) 5 MG TABS tablet Take 5 mg by mouth daily. 07/29/18  Yes Shamleffer, Ibtehal Jaralla, MD  glucose blood (CONTOUR NEXT TEST) test strip 4 times a day 07/08/18  Yes Shamleffer, Ibtehal Jaralla, MD  Insulin Glargine, 2 Unit Dial, (TOUJEO MAX SOLOSTAR) 300 UNIT/ML SOPN Inject 36 Units into the skin daily. 07/08/18  Yes Shamleffer, Ibtehal Jaralla, MD  Insulin Pen Needle (BD PEN NEEDLE MICRO U/F) 32G X 6 MM MISC 4x a day 07/08/18  Yes Shamleffer, Ibtehal Jaralla, MD  levothyroxine (SYNTHROID, LEVOTHROID) 175 MCG tablet Take 1 tablet (175 mcg total) by mouth daily before breakfast. 07/22/18  Yes Cody, Jessica  R, MD  Melatonin 10 MG TABS Take 1 tablet by mouth at bedtime as needed.   Yes [provider]  metFORMIN (GLUCOPHAGE) 1000 MG tablet Take 1 tablet (1,000 mg total) by mouth 2 (two) times daily with a meal. 07/08/18  Yes Shamleffer, Ibtehal Jaralla, MD  NOVOLOG FLEXPEN 100 UNIT/ML FlexPen Inject 10 Units into the skin 3 (three) times daily with meals. Max daily of 40 units 07/08/18  Yes Shamleffer, Ibtehal Jaralla, MD  pantoprazole (PROTONIX) 40 MG tablet Take 1 tablet (40 mg total) by mouth 2 (two) times daily. 07/22/18  Yes Cody, Jessica R, MD    sulfamethoxazole-trimethoprim (BACTRIM DS,SEPTRA DS) 800-160 MG tablet Take 1 tablet by mouth 2 (two) times daily for 10 days. 08/03/18 08/13/18  Malachy Mood, MD    Physical Exam Vitals:  Vitals:   08/03/18 1353  BP: 118/60  Pulse: (!) 131   Patient's last menstrual period was 06/28/2015 (approximate).  General: NAD HEENT: normocephalic, anicteric Pulmonary: No increased work of breathing Genitourinary:  External: Normal external female genitalia.   The right labia contains a small furuncle, there is some slight overlying erythema, tenderness, no induration or fluctuance.  Normal urethral meatus, normal Bartholin's and Skene's glands.    Rectal: deferred  Lymphatic: no evidence of inguinal lymphadenopathy Extremities: no edema, erythema, or tenderness Neurologic: Grossly intact Psychiatric: mood appropriate, affect full  Female chaperone present for pelvic  portions of the physical exam  Assessment: 53 y.o. G1P0010 with right labial furuncle  Plan: Problem List Items Addressed This Visit    None    Visit Diagnoses    Furuncle of labia majora    -  Primary   Relevant Medications   sulfamethoxazole-trimethoprim (BACTRIM DS,SEPTRA DS) 800-160 MG tablet     1) Labial Furuncle - Right labia small furuncle, no fluid collection amenable to drainage - start bactrim also rx diflucan if candida symptoms develop - Warm compress  sitabaths - Re-eval in 3 days prior to weekend to verify improving  2) A total of 15 minutes were spent in face-to-face contact with the patient during this encounter with over half of that time devoted to counseling and coordination of care.  3) Return in 3 days (on 08/06/2018) for medication follow up.    Malachy Mood, MD, [facility], [place]

## 2018-08-06 ENCOUNTER — Encounter: Payer: Self-pay | Admitting: Obstetrics and Gynecology

## 2018-08-06 ENCOUNTER — Other Ambulatory Visit: Payer: Self-pay

## 2018-08-06 ENCOUNTER — Other Ambulatory Visit: Payer: Self-pay | Admitting: Obstetrics and Gynecology

## 2018-08-06 ENCOUNTER — Ambulatory Visit (INDEPENDENT_AMBULATORY_CARE_PROVIDER_SITE_OTHER): Payer: BLUE CROSS/BLUE SHIELD | Admitting: Obstetrics and Gynecology

## 2018-08-06 VITALS — BP 106/68 | HR 82 | Wt 266.0 lb

## 2018-08-06 DIAGNOSIS — N764 Abscess of vulva: Secondary | ICD-10-CM | POA: Diagnosis not present

## 2018-08-06 NOTE — Progress Notes (Signed)
Obstetrics & Gynecology Office Visit   Chief Complaint:  Chief Complaint  Patient presents with  . Follow-up    Bartholin Cyst    History of Present Illness: 53 y.o. G1P0010 presenting for medication follow up for a diagnosis of vulvar abscess.  She is currently being managed with antibiotics.   The area in question on the right labia major has become more tender and enlarged per the patient.  She has not noted any spontaneous drainage. No fevers, no chills.  Continues on bactrim DS po.  Review of Systems: Review of Systems  Constitutional: Negative.   Genitourinary: Negative.   Skin: Negative.      Past Medical History:  Past Medical History:  Diagnosis Date  . Arthritis   . Diabetes mellitus without complication ([place])   . Thyroid disease     Past Surgical History:  Past Surgical History:  Procedure Laterality Date  . APPENDECTOMY  1993  . CALCANEAL OSTEOTOMY Left 07/17/2016   Procedure: CALCANEAL OSTEOTOMY;  Surgeon: [name], MD;  Location: [facility] ORS;  Service: Orthopedics;  Laterality: Left;  . IRRIGATION AND DEBRIDEMENT ABSCESS N/A 01/24/2015   Procedure: IRRIGATION AND DEBRIDEMENT ABSCESS/GROIN ABSCESS;  Surgeon: [name], MD;  Location: [facility] ORS;  Service: General;  Laterality: N/A;  . KNEE ARTHROSCOPY W/ MENISCAL REPAIR Right 2008  . ROTATOR CUFF REPAIR Right 2015  . TONSILLECTOMY  1998    Gynecologic History: Patient's last menstrual period was 06/28/2015 (approximate).  Obstetric History: G1P0010  Family History:  Family History  Problem Relation Age of Onset  . Diabetes Father   . Heart failure Father   . Diabetes Brother   . Arthritis Mother   . Diabetes Mother   . Transient ischemic attack Mother   . Heart failure Maternal Grandmother   . Alcohol abuse Maternal Grandmother   . Diabetes Paternal Grandmother   . Kidney failure Paternal Grandmother   . Heart failure Paternal Grandfather   . Cancer Paternal Grandfather    unknown type  . Hypertension Brother   . Breast cancer Maternal Great-grandmother     Social History:  Social History   Socioeconomic History  . Marital status: Single    Spouse name: Not on file  . Number of children: 0  . Years of education: Associates  . Highest education level: Not on file  Occupational History  . Not on file  Social Needs  . Financial resource strain: Not hard at all  . Food insecurity:    Worry: Not on file    Inability: Not on file  . Transportation needs:    Medical: Not on file    Non-medical: Not on file  Tobacco Use  . Smoking status: Current Some Day Smoker    Packs/day: 0.25    Years: 30.00    Pack years: 7.50    Types: Cigarettes  . Smokeless tobacco: Never Used  Substance and Sexual Activity  . Alcohol use: Yes    Comment: a couple of times a month, typically less than 2 drinks  . Drug use: No  . Sexual activity: Not Currently    Birth control/protection: Post-menopausal  Lifestyle  . Physical activity:    Days per week: Not on file    Minutes per session: Not on file  . Stress: Not on file  Relationships  . Social connections:    Talks on phone: Not on file    Gets together: Not on file    Attends religious service: Not on  file    Active member of club or organization: Not on file    Attends meetings of clubs or organizations: Not on file    Relationship status: Not on file  . Intimate partner violence:    Fear of current or ex partner: Not on file    Emotionally abused: Not on file    Physically abused: Not on file    Forced sexual activity: Not on file  Other Topics Concern  . Not on file  Social History Narrative   Lives alone   Dog - Sugar   Enjoys: hiking - looking for local trails   Exercise: walking trails with dog   Diet: doing well, did weight watchers, watching carbs    Allergies:  Allergies  Allergen Reactions  . Corticosteroids Other (See Comments)    Elevate blood sugar    Medications: Prior to  Admission medications   Medication Sig Start Date End Date Taking? Authorizing Provider  albuterol (PROVENTIL HFA;VENTOLIN HFA) 108 (90 Base) MCG/ACT inhaler Inhale 1-2 puffs into the lungs every 6 (six) hours as needed for wheezing or shortness of breath.    [provider]  atorvastatin (LIPITOR) 10 MG tablet Take 1 tablet (10 mg total) by mouth daily. 07/22/18   [name], MD  cetirizine (ZYRTEC) 10 MG tablet Take 10 mg by mouth daily.    [provider]  Continuous Blood Gluc Receiver (DEXCOM G6 RECEIVER) DEVI 1 Device by Does not apply route as directed. 07/31/18   Shamleffer, Melanie Crazier, MD  Continuous Blood Gluc Sensor (DEXCOM G6 SENSOR) MISC 1 kit by Does not apply route as directed. 07/31/18   Shamleffer, Melanie Crazier, MD  Continuous Blood Gluc Transmit (DEXCOM G6 TRANSMITTER) MISC 1 Device by Does not apply route as directed. 07/31/18   Shamleffer, Melanie Crazier, MD  dapagliflozin propanediol (FARXIGA) 10 MG TABS tablet Take 10 mg by mouth daily. 07/29/18   Shamleffer, Melanie Crazier, MD  dapagliflozin propanediol (FARXIGA) 5 MG TABS tablet Take 5 mg by mouth daily. 07/29/18   Shamleffer, Melanie Crazier, MD  glucose blood (CONTOUR NEXT TEST) test strip 4 times a day 07/08/18   Shamleffer, Melanie Crazier, MD  Insulin Glargine, 2 Unit Dial, (TOUJEO MAX SOLOSTAR) 300 UNIT/ML SOPN Inject 36 Units into the skin daily. 07/08/18   Shamleffer, Melanie Crazier, MD  Insulin Pen Needle (BD PEN NEEDLE MICRO U/F) 32G X 6 MM MISC 4x a day 07/08/18   Shamleffer, Melanie Crazier, MD  levothyroxine (SYNTHROID, LEVOTHROID) 175 MCG tablet Take 1 tablet (175 mcg total) by mouth daily before breakfast. 07/22/18   [name], MD  Melatonin 10 MG TABS Take 1 tablet by mouth at bedtime as needed.    [provider]  metFORMIN (GLUCOPHAGE) 1000 MG tablet Take 1 tablet (1,000 mg total) by mouth 2 (two) times daily with a meal. 07/08/18   Shamleffer, Melanie Crazier, MD  NOVOLOG  FLEXPEN 100 UNIT/ML FlexPen Inject 10 Units into the skin 3 (three) times daily with meals. Max daily of 40 units 07/08/18   Shamleffer, Melanie Crazier, MD  pantoprazole (PROTONIX) 40 MG tablet Take 1 tablet (40 mg total) by mouth 2 (two) times daily. 07/22/18   [name], MD  sulfamethoxazole-trimethoprim (BACTRIM DS,SEPTRA DS) 800-160 MG tablet Take 1 tablet by mouth 2 (two) times daily for 10 days. 08/03/18 08/13/18  Malachy Mood, MD    Physical Exam Vitals:  Vitals:   08/06/18 1054  BP: 106/68  Pulse: 82   Patient's  last menstrual period was 06/28/2015 (approximate).  General: NAD HEENT: normocephalic, anicteric Pulmonary: No increased work of breathing  External: Right labia with an approximately 1cm area of erythema and fluctuance.  No significant surrounding induration.  Stable in overall size but now with a clearly defined central area of fluctuance not previously present.  Normal urethral meatus, normal Bartholin's and Skene's glands.    Vagina: Normal vaginal mucosa, no evidence of prolapse.    Rectal: deferred  Lymphatic: no evidence of inguinal lymphadenopathy Extremities: no edema, erythema, or tenderness Neurologic: Grossly intact Psychiatric: mood appropriate, affect full  Female chaperone present for pelvic  portions of the physical exam\   Vulvar I&D  Enlarged abscess palpated in the right labia.   Informed consent was obtained.  Discussed complications and possible outcomes of procedure including recurrence of cyst, scarring leading to infection, bleeding, dyspareunia, distortion of anatomy.  Patient was examined in the dorsal lithotomy position and fluctuant mass was identified.  The area was prepped with Iodine. 1% Lidocaine (3 ml) was then used to infiltrate area on top of abscess cavity.  A 37m stab incision was made using a sterile scapel. Upon palpation of the mass, a moderate amount of bloody purulent drainage was expressed through the incision. A Q-tip  used to break up loculations, which resulted in expression of more bloody purulent drainage.  Samples of the drainage were sent for cultures. A 1/4" wic was placed  Assessment: 53y.o. G1P0010 with vulvar abscess  Plan: Problem List Items Addressed This Visit    None    Visit Diagnoses    Labial abscess    -  Primary   Relevant Orders   Wound culture     1) Vulvar abscess - drainable fluid collection noted today and I&D performed - finish out bactrim - Changes in antibiotics pending culture results - wic may be removed in the next 24-hrs, given shallow nature of abscess and relatively small cavity no need to repack  2) A total of 15 minutes were spent in face-to-face contact with the patient during this encounter with over half of that time devoted to counseling and coordination of care.  3) Return in about 1 week (around 08/13/2018) for Follow up.   AMalachy Mood MD, FPort AlleganyOB/GYN, CB[facility]Group 08/06/2018, 11:02 AM

## 2018-08-08 LAB — AEROBIC CULTURE

## 2018-08-14 ENCOUNTER — Other Ambulatory Visit: Payer: Self-pay | Admitting: Internal Medicine

## 2018-08-14 ENCOUNTER — Encounter: Payer: Self-pay | Admitting: Family Medicine

## 2018-08-14 ENCOUNTER — Other Ambulatory Visit: Payer: Self-pay

## 2018-08-14 DIAGNOSIS — E1165 Type 2 diabetes mellitus with hyperglycemia: Secondary | ICD-10-CM

## 2018-08-14 MED ORDER — DEXCOM G6 RECEIVER DEVI: 1.0000 | 0 refills | Status: DC

## 2018-08-27 ENCOUNTER — Other Ambulatory Visit: Payer: Self-pay | Admitting: Internal Medicine

## 2018-08-28 ENCOUNTER — Ambulatory Visit (INDEPENDENT_AMBULATORY_CARE_PROVIDER_SITE_OTHER): Payer: BLUE CROSS/BLUE SHIELD

## 2018-08-28 ENCOUNTER — Other Ambulatory Visit: Payer: Self-pay

## 2018-08-28 DIAGNOSIS — E1165 Type 2 diabetes mellitus with hyperglycemia: Secondary | ICD-10-CM | POA: Diagnosis not present

## 2018-08-28 LAB — POCT GLYCOSYLATED HEMOGLOBIN (HGB A1C): Hemoglobin A1C: 9.9 % — AB (ref 4.0–5.6)

## 2018-09-01 ENCOUNTER — Encounter: Payer: Self-pay | Admitting: *Deleted

## 2018-09-01 ENCOUNTER — Telehealth: Payer: Self-pay | Admitting: Gastroenterology

## 2018-09-01 ENCOUNTER — Ambulatory Visit: Payer: BLUE CROSS/BLUE SHIELD | Admitting: Dietician

## 2018-09-01 NOTE — Telephone Encounter (Signed)
Gastroenterology Pre-Procedure Review  Request Date: Fri 12/18/2018   [facility] Requesting Physician: Dr. Vicente Males  PATIENT REVIEW QUESTIONS: The patient responded to the following health history questions as indicated:    1. Are you having any GI issues? no 2. Do you have a personal history of Polyps? no 3. Do you have a family history of Colon Cancer or Polyps? yes (Sister polyps) 4. Diabetes Mellitus? yes (Type II) 5. Joint replacements in the past 12 months?no 6. Major health problems in the past 3 months?no 7. Any artificial heart valves, MVP, or defibrillator?no    MEDICATIONS & ALLERGIES:    Patient reports the following regarding taking any anticoagulation/antiplatelet therapy:   Plavix, Coumadin, Eliquis, Xarelto, Lovenox, Pradaxa, Brilinta, or Effient? no Aspirin? no  Patient confirms/reports the following medications:  Current Outpatient Medications  Medication Sig Dispense Refill  . albuterol (PROVENTIL HFA;VENTOLIN HFA) 108 (90 Base) MCG/ACT inhaler Inhale 1-2 puffs into the lungs every 6 (six) hours as needed for wheezing or shortness of breath.    Marland Kitchen atorvastatin (LIPITOR) 10 MG tablet Take 1 tablet (10 mg total) by mouth daily. 90 tablet 3  . cetirizine (ZYRTEC) 10 MG tablet Take 10 mg by mouth daily.    . Continuous Blood Gluc Receiver (DEXCOM G6 RECEIVER) DEVI 1 Device by Does not apply route as directed. 1 Device 0  . Continuous Blood Gluc Sensor (DEXCOM G6 SENSOR) MISC 1 kit by Does not apply route as directed. 3 each 6  . Continuous Blood Gluc Transmit (DEXCOM G6 TRANSMITTER) MISC 1 Device by Does not apply route as directed. 1 each 3  . dapagliflozin propanediol (FARXIGA) 10 MG TABS tablet Take 10 mg by mouth daily. 30 tablet 6  . dapagliflozin propanediol (FARXIGA) 5 MG TABS tablet Take 5 mg by mouth daily. 30 tablet 0  . glucose blood (CONTOUR NEXT TEST) test strip 4 times a day 150 each 12  . Insulin Glargine, 2 Unit Dial, (TOUJEO MAX SOLOSTAR) 300 UNIT/ML SOPN Inject  36 Units into the skin daily. 15 mL 3  . Insulin Pen Needle (BD PEN NEEDLE MICRO U/F) 32G X 6 MM MISC 4x a day 150 each 3  . levothyroxine (SYNTHROID, LEVOTHROID) 175 MCG tablet Take 1 tablet (175 mcg total) by mouth daily before breakfast. 90 tablet 3  . Melatonin 10 MG TABS Take 1 tablet by mouth at bedtime as needed.    . metFORMIN (GLUCOPHAGE) 1000 MG tablet Take 1 tablet (1,000 mg total) by mouth 2 (two) times daily with a meal. 180 tablet 3  . NOVOLOG FLEXPEN 100 UNIT/ML FlexPen Inject 10 Units into the skin 3 (three) times daily with meals. Max daily of 40 units 15 mL 3  . pantoprazole (PROTONIX) 40 MG tablet Take 1 tablet (40 mg total) by mouth 2 (two) times daily. 180 tablet 3   No current facility-administered medications for this visit.     Patient confirms/reports the following allergies:  Allergies  Allergen Reactions  . Corticosteroids Other (See Comments)    Elevate blood sugar    No orders of the defined types were placed in this encounter.   AUTHORIZATION INFORMATION Primary Insurance: 1D#: Group #:  Secondary Insurance: 1D#: Group #:  SCHEDULE INFORMATION: Date:  Time: Location:

## 2018-09-02 ENCOUNTER — Other Ambulatory Visit: Payer: Self-pay

## 2018-09-02 ENCOUNTER — Telehealth: Payer: Self-pay

## 2018-09-02 DIAGNOSIS — Z1211 Encounter for screening for malignant neoplasm of colon: Secondary | ICD-10-CM

## 2018-09-02 MED ORDER — NA SULFATE-K SULFATE-MG SULF 17.5-3.13-1.6 GM/177ML PO SOLN: 1.0000 | Freq: Once | ORAL | 0 refills | Status: AC

## 2018-09-02 NOTE — Telephone Encounter (Signed)
Patient has been contacted to review and discuss her colonoscopy instructions.  Rx for Suprep has been sent to pharmacy as she has requested.  Thanks [employer]

## 2018-09-08 ENCOUNTER — Encounter: Payer: Self-pay | Admitting: Internal Medicine

## 2018-09-09 ENCOUNTER — Encounter: Payer: Self-pay | Admitting: Internal Medicine

## 2018-09-09 ENCOUNTER — Other Ambulatory Visit: Payer: Self-pay

## 2018-09-09 ENCOUNTER — Ambulatory Visit (INDEPENDENT_AMBULATORY_CARE_PROVIDER_SITE_OTHER): Payer: BLUE CROSS/BLUE SHIELD | Admitting: Internal Medicine

## 2018-09-09 DIAGNOSIS — E1142 Type 2 diabetes mellitus with diabetic polyneuropathy: Secondary | ICD-10-CM | POA: Diagnosis not present

## 2018-09-09 DIAGNOSIS — Z794 Long term (current) use of insulin: Secondary | ICD-10-CM

## 2018-09-09 DIAGNOSIS — E1165 Type 2 diabetes mellitus with hyperglycemia: Secondary | ICD-10-CM

## 2018-09-09 NOTE — Progress Notes (Signed)
Virtual Visit via Video Note  I connected with Tanya Graves on 09/09/18 at 3:40 pm by a video enabled telemedicine application and verified that I am speaking with the correct person using two identifiers.   I discussed the limitations of evaluation and management by telemedicine and the availability of in person appointments. The patient expressed understanding and agreed to proceed.   -Location of the patient : Home  -Location of the provider : Office  -The names of all persons participating in the telemedicine service : [place]                         Name: Tanya Graves  Age/ Sex: 53 y.o., female   MRN/ DOB: [id number], [id number]     PCP: [name], MD   Reason for Endocrinology Evaluation: Type 2 Diabetes Mellitus  Initial Endocrine Consultative Visit: 07/08/2018    PATIENT IDENTIFIER: Tanya Graves is a 53 y.o. female with a past medical history of T2DM, HTN, Dyslipidemia and hypothyroidism . The patient has followed with Endocrinology clinic since 07/08/2018 for consultative assistance with management of her diabetes.  DIABETIC HISTORY:  Ms. Burling was diagnosed with T1DM ~ 2010.  She does not recall intolerance to other oral glycemic agents.  On her initial visit here she was on MDI regimen of insulin and metformin. Her hemoglobin A1c has ranged from 11.4%  in 2016, peaking at 12.5% in 2019   Thyroid Disease  She is S/P RAI ablation years ago > 20 yrs ago and has been on levothyroxine . She is on Levothyroxine 175 mcg daily   SUBJECTIVE:   During the last visit (3/4//2020): We increased Toujeo  And novolog , continued Metformin and started [place].  Today (09/09/2018): Ms. [name] for 6 week virtual  follow-up on diabetes management.  She checks her blood sugars 4 times daily, preprandial and bedtime. The patient has had hypoglycemic episodes since the last clinic visit. Occurring 2x/week, mostly during the day. Otherwise, the patient has not  required any recent emergency interventions for hypoglycemia and has not had recent hospitalizations secondary to hyper or hypoglycemic episodes.   She has continued with low-carb diet.     ROS: As per HPI and as detailed below: Review of Systems  Respiratory: Negative for cough and shortness of breath.   Cardiovascular: Negative for chest pain and palpitations.  Gastrointestinal: Negative for constipation and nausea.  Genitourinary: Negative for frequency.  Endo/Heme/Allergies: Negative for polydipsia.      HOME DIABETES REGIMEN:   Metformin 1000 mg twice daily with meals  Toujeo 42 units daily  NovoLog 12 units with each meal  If the pre-meal glucose is 234m/dL of  over patient to add 2 more units of NovoLog to that meal.  Farxiga 10 mg     GLUCOSE LOG:  Date Breakfast Lunch  Supper Bedtime  09/09/18 91     4/14  54 75 71  4/13 163 261  144  4/12 211  171 163  4/11 167 178  101    HISTORY:  Past Medical History:  Past Medical History:  Diagnosis Date  . Arthritis   . Diabetes [place] complication [place]   . Thyroid disease    Past Surgical History:  Past Surgical History:  Procedure Laterality Date  . APPENDECTOMY  1993  . CALCANEAL OSTEOTOMY Left 07/17/2016   Procedure: CALCANEAL OSTEOTOMY;  Surgeon: HEarnestine Leys MD;  Location: [facility] ORS;  Service:  Orthopedics;  Laterality: Left;  . IRRIGATION AND DEBRIDEMENT ABSCESS N/A 01/24/2015   Procedure: IRRIGATION AND DEBRIDEMENT ABSCESS/GROIN ABSCESS;  Surgeon: [name], MD;  Location: [facility] ORS;  Service: General;  Laterality: N/A;  . KNEE ARTHROSCOPY W/ MENISCAL REPAIR Right 2008  . ROTATOR CUFF REPAIR Right 2015  . TONSILLECTOMY  1998    Social History:  reports that she quit smoking about 3 weeks ago. Her smoking use included cigarettes. She has a 7.50 pack-year smoking history. She has never used smokeless tobacco. She reports current alcohol use. She reports that she does not use  drugs. Family History:  Family History  Problem Relation Age of Onset  . Diabetes Father   . Heart failure Father   . Diabetes Brother   . Arthritis Mother   . Diabetes Mother   . Transient ischemic attack Mother   . Heart failure Maternal Grandmother   . Alcohol abuse Maternal Grandmother   . Diabetes Paternal Grandmother   . Kidney failure Paternal Grandmother   . Heart failure Paternal Grandfather   . Cancer Paternal Grandfather        unknown type  . Hypertension Brother   . Breast cancer Maternal Great-grandmother      HOME MEDICATIONS: Allergies as of 09/09/2018      Reactions   Corticosteroids Other (See Comments)   Elevate blood sugar      Medication List       Accurate as of September 09, 2018  1:22 PM. Always use your most recent med list.        albuterol 108 (90 Base) MCG/ACT inhaler Commonly known as:  PROVENTIL HFA;VENTOLIN HFA Inhale 1-2 puffs into the lungs every 6 (six) hours as needed for wheezing or shortness of breath.   atorvastatin 10 MG tablet Commonly known as:  LIPITOR Take 1 tablet (10 mg total) by mouth daily.   cetirizine 10 MG tablet Commonly known as:  ZYRTEC Take 10 mg by mouth daily.   dapagliflozin propanediol 5 MG Tabs tablet Commonly known as:  Farxiga Take 5 mg by mouth daily.   dapagliflozin propanediol 10 MG Tabs tablet Commonly known as:  Farxiga Take 10 mg by mouth daily.   Dexcom G6 Receiver Devi 1 Device by Does not apply route as directed.   Dexcom G6 Sensor Misc 1 kit by Does not apply route as directed.   Dexcom G6 Transmitter Misc 1 Device by Does not apply route as directed.   glucose blood test strip Commonly known as:  Contour Next Test 4 times a day   Insulin Glargine (2 Unit Dial) 300 UNIT/ML Sopn Commonly known as:  Toujeo Max SoloStar Inject 36 Units into the skin daily.   Insulin Pen Needle 32G X 6 MM Misc Commonly known as:  BD Pen Needle Micro U/F 4x a day   levothyroxine 175 MCG tablet  Commonly known as:  SYNTHROID, LEVOTHROID Take 1 tablet (175 mcg total) by mouth daily before breakfast.   Melatonin 10 MG Tabs Take 1 tablet by mouth at bedtime as needed.   metFORMIN 1000 MG tablet Commonly known as:  GLUCOPHAGE Take 1 tablet (1,000 mg total) by mouth 2 (two) times daily with a meal.   NovoLOG FlexPen 100 UNIT/ML FlexPen Generic drug:  insulin aspart Inject 10 Units into the skin 3 (three) times daily with meals. Max daily of 40 units   pantoprazole 40 MG tablet Commonly known as:  PROTONIX Take 1 tablet (40 mg total) by mouth 2 (two) times  daily.        DATA REVIEWED:  Lab Results  Component Value Date   HGBA1C 9.9 (A) 08/28/2018   HGBA1C 11.5 (H) 06/01/2018   HGBA1C 12.5 (H) 06/13/2017   Lab Results  Component Value Date   LDLCALC 139 03/17/2018   CREATININE 0.88 07/29/2018    ASSESSMENT / PLAN / RECOMMENDATIONS:  1)Type 2 Diabetes Mellitus, Poorly controlled, With Neuropathic complications - Most recent A1c of 9.9 %. Goal A1c < 7.0 %.   - I praised the patient on glucose improvement - Her insulin sensivity has improved with the addition of Farxiga, hence the hypoglycemia, she continues to have insulin- CHO mismatch, she has not been able to see our CDE due to COVID-19 restrictions.  - We will reduce her insulin dosing as below, pt encouraged to contact us with hypoglycemia in the future and not to wait until next appointment.   MEDICATIONS:  Continue metformin 1000 mg twice daily with meals  Decrease Toujeo to 34 units daily  Decrease NovoLog to 10 units with each meal  If the pre-meal glucose is 270m/dL over patient to add 2 more units of NovoLog to that meal.  Farxiga 10 mg daily  EDUCATION / INSTRUCTIONS:  BG monitoring instructions: Patient is instructed to check her blood sugars 4 times a day, before meals and bedtime.  [facility] if: BG persistently < 70 or > 300. . I reviewed the Rule of 15 for the  treatment of hypoglycemia in detail with the patient. Literature supplied.   I discussed the assessment and treatment plan with the patient. The patient was provided an opportunity to ask questions and all were answered. The patient agreed with the plan and demonstrated an understanding of the instructions.   The patient was advised to call back or seek an in-person evaluation if the symptoms worsen or if the condition fails to improve as anticipated.  I provided 34 minutes of non-face-to-face time during this encounter, > 50% of the time was spent in counseling and education about matching CHO to insulin dose when possible.      Signed electronically by: AMack Guise MD  [facility]  [facility] LLCGroup [address], SCommerce CityGGilman [place] [address]Phone: [phone]: [phone]  CC: CLesleigh Noe [id number]: [phone] Fax: [phone] Return to Endocrinology clinic as below: Future Appointments  Date Time Provider DLinganore 09/09/2018  3:40 PM Saquan Furtick, IMelanie Crazier MD [facility] None  11/10/2018  3:30 PM JClydell Hakim RD N[place] LagoonND[place]  03/22/2019  8:00 AM CLesleigh Noe MD [facility] P[facility]

## 2018-09-10 ENCOUNTER — Other Ambulatory Visit: Payer: Self-pay | Admitting: Internal Medicine

## 2018-09-10 ENCOUNTER — Other Ambulatory Visit: Payer: Self-pay

## 2018-09-10 DIAGNOSIS — E1165 Type 2 diabetes mellitus with hyperglycemia: Secondary | ICD-10-CM

## 2018-09-10 DIAGNOSIS — Z794 Long term (current) use of insulin: Secondary | ICD-10-CM | POA: Insufficient documentation

## 2018-09-10 DIAGNOSIS — E1142 Type 2 diabetes mellitus with diabetic polyneuropathy: Secondary | ICD-10-CM | POA: Insufficient documentation

## 2018-09-10 MED ORDER — DEXCOM G6 RECEIVER DEVI: 1.0000 | 0 refills | Status: DC

## 2018-09-10 MED ORDER — DEXCOM G6 SENSOR MISC: 1.0000 | 11 refills | Status: DC

## 2018-09-10 MED ORDER — DEXCOM G6 TRANSMITTER MISC: 1.0000 | 3 refills | Status: DC

## 2018-09-23 ENCOUNTER — Encounter: Payer: Self-pay | Admitting: Internal Medicine

## 2018-11-10 ENCOUNTER — Ambulatory Visit: Payer: BLUE CROSS/BLUE SHIELD | Admitting: Dietician

## 2018-11-25 ENCOUNTER — Ambulatory Visit: Payer: BLUE CROSS/BLUE SHIELD | Admitting: Registered"

## 2018-12-01 NOTE — Progress Notes (Signed)
Tanya Madara T. Asuncion Shibata, MD Primary Care and [place] [facility] [place], [address] Phone: [phone]  FAX: [phone]  Tanya Graves - 53 y.o. female  MRN [id number]  Date of Birth: 1966-05-17  Visit Date: 12/02/2018  PCP: [name], MD  Referred by: [name], MD  Chief Complaint  Patient presents with  . Foot Pain    Right   Subjective:   This 53 y.o. female patient presents with a 1 month long history of heel pain. This is notable for worsening pain first thing in the morning when arising and standing after sitting.   Prior foot or ankle fractures: none Prior operations: none Orthotics or bracing: none Medications: none PT or home rehab: none Night splints: no Ice massage: no Ball massage: no  She is a pleasant young lady who presents with foot pain with a history of significant poorly controlled diabetes.   H/o L heel surgery.   R PF on the right. About 1 month. Does not recall any time of injury. Did do some yardwork.    Metatarsal pain: no  The PMH, PSH, Social History, Family History, Medications, and allergies have been reviewed in [facility], and have been updated if relevant.  Patient Active Problem List   Diagnosis Date Noted  . Type 2 diabetes mellitus with diabetic polyneuropathy, with long-term current use of insulin ([place]) 09/10/2018  . Tobacco use 07/22/2018  . Hypothyroidism 07/22/2018  . GERD (gastroesophageal reflux disease) 07/22/2018  . Hyperlipidemia associated with type 2 diabetes mellitus ([place]) 07/22/2018  . Hyperglycemia   . Poorly controlled type 2 diabetes mellitus ([place]) 01/22/2015    Past Medical History:  Diagnosis Date  . Arthritis   . Diabetes mellitus without complication ([place])   . Thyroid disease     Past Surgical History:  Procedure Laterality Date  . APPENDECTOMY  1993  . CALCANEAL OSTEOTOMY Left 07/17/2016   Procedure: CALCANEAL OSTEOTOMY;   Surgeon: [name], MD;  Location: [facility] ORS;  Service: Orthopedics;  Laterality: Left;  . IRRIGATION AND DEBRIDEMENT ABSCESS N/A 01/24/2015   Procedure: IRRIGATION AND DEBRIDEMENT ABSCESS/GROIN ABSCESS;  Surgeon: [name], MD;  Location: [facility] ORS;  Service: General;  Laterality: N/A;  . KNEE ARTHROSCOPY W/ MENISCAL REPAIR Right 2008  . ROTATOR CUFF REPAIR Right 2015  . TONSILLECTOMY  1998    Social History   Socioeconomic History  . Marital status: Single    Spouse name: Not on file  . Number of children: 0  . Years of education: Associates  . Highest education level: Not on file  Occupational History  . Not on file  Social Needs  . Financial resource strain: Not hard at all  . Food insecurity    Worry: Not on file    Inability: Not on file  . Transportation needs    Medical: Not on file    Non-medical: Not on file  Tobacco Use  . Smoking status: Former Smoker    Packs/day: 0.25    Years: 30.00    Pack years: 7.50    Types: Cigarettes    Quit date: 08/14/2018    Years since quitting: 0.3  . Smokeless tobacco: Never Used  Substance and Sexual Activity  . Alcohol use: Yes    Comment: a couple of times a month, typically less than 2 drinks  . Drug use: No  . Sexual activity: Not Currently    Birth control/protection: Post-menopausal  Lifestyle  .  Physical activity    Days per week: Not on file    Minutes per session: Not on file  . Stress: Not on file  Relationships  . Social Herbalist on phone: Not on file    Gets together: Not on file    Attends religious service: Not on file    Active member of club or organization: Not on file    Attends meetings of clubs or organizations: Not on file    Relationship status: Not on file  . Intimate partner violence    Fear of current or ex partner: Not on file    Emotionally abused: Not on file    Physically abused: Not on file    Forced sexual activity: Not on file  Other Topics Concern  . Not on  file  Social History Narrative   Lives alone   Dog - Sugar   Enjoys: hiking - looking for local trails   Exercise: walking trails with dog   Diet: doing well, did weight watchers, watching carbs    Family History  Problem Relation Age of Onset  . Diabetes Father   . Heart failure Father   . Diabetes Brother   . Arthritis Mother   . Diabetes Mother   . Transient ischemic attack Mother   . Heart failure Maternal Grandmother   . Alcohol abuse Maternal Grandmother   . Diabetes Paternal Grandmother   . Kidney failure Paternal Grandmother   . Heart failure Paternal Grandfather   . Cancer Paternal Grandfather        unknown type  . Hypertension Brother   . Breast cancer Maternal Great-grandmother     Allergies  Allergen Reactions  . Corticosteroids Other (See Comments)    Elevate blood sugar    Medication list reviewed and updated in full in [place].  GEN: No fevers, chills. Nontoxic. Primarily MSK c/o today. MSK: Detailed in the HPI GI: tolerating PO intake without difficulty Neuro: No numbness, parasthesias, or tingling associated. Otherwise the pertinent positives of the ROS are noted above.   Objective:   Blood pressure 118/72, pulse 92, temperature 98.6 F (37 C), temperature source Oral, height 5' 8.25" (1.734 m), weight 282 lb 4 oz (128 kg), last menstrual period 06/28/2015, SpO2 97 %.  GEN: Well-developed,well-nourished,in no acute distress; alert,appropriate and cooperative throughout examination HEENT: Normocephalic and atraumatic without obvious abnormalities. Ears, externally no deformities PULM: Breathing comfortably in no respiratory distress EXT: No clubbing, cyanosis, or edema PSYCH: Normally interactive. Cooperative during the interview. Pleasant. Friendly and conversant. Not anxious or depressed appearing. Normal, full affect.  RIGHT Echymosis: no Edema: no ROM: full LE B Gait: heel toe, non-antalgic MT pain: no Callus pattern: none  Lateral Mall: NT Medial Mall: NT Talus: NT Navicular: NT Calcaneous: NT Metatarsals: NT 5th MT: NT Phalanges: NT Achilles: NT Plantar Fascia: tender, medial along PF. Pain with forced dorsi Cavus foot Fat Pad: NT Peroneals: NT Post Tib: NT Great Toe: Nml motion Ant Drawer: neg Other foot breakdown: none Long arch: preserved Transverse arch: preserved Hindfoot breakdown: none Sensation: intact  Assessment and Plan:     ICD-10-CM   1. Plantar fasciitis, right  M72.2    >25 minutes spent in face to face time with patient, >50% spent in counselling or coordination of care  Classic PF  Anatomy reviewed. Stretching and rehab are critically important to the treatment of PF. Reviewed footwear. Rigid soles have been shown to help with PF.  Reviewed rehab  of stretching and calf raises.  Reviewed rehab from [facility]  Could consider a corticosteroid injection if conservative treatment fails.  Follow-up: No follow-ups on file.  No orders of the defined types were placed in this encounter.  No orders of the defined types were placed in this encounter.   Signed,  [name]. Pavneet Markwood, MD   Patient's Medications  New Prescriptions   No medications on file  Previous Medications   ALBUTEROL (PROVENTIL HFA;VENTOLIN HFA) 108 (90 BASE) MCG/ACT INHALER    Inhale 1-2 puffs into the lungs every 6 (six) hours as needed for wheezing or shortness of breath.   ATORVASTATIN (LIPITOR) 10 MG TABLET    Take 1 tablet (10 mg total) by mouth daily.   CONTINUOUS BLOOD GLUC RECEIVER (DEXCOM G6 RECEIVER) DEVI    1 Device by Does not apply route as directed. Use as directed for continuous glucose monitoring DX E11.65   CONTINUOUS BLOOD GLUC SENSOR (DEXCOM G6 SENSOR) MISC    1 kit by Does not apply route as directed. Use as directed for continuous glucose monitoring-change every 10 days DX E11.65   CONTINUOUS BLOOD GLUC TRANSMIT (DEXCOM G6 TRANSMITTER) MISC    1  Device by Does not apply route as directed. Use as directed for continuous glucose monitoring-change every 3 months DX E11.65   DAPAGLIFLOZIN PROPANEDIOL (FARXIGA) 10 MG TABS TABLET    Take 10 mg by mouth daily.   GLUCOSE BLOOD (CONTOUR NEXT TEST) TEST STRIP    4 times a day   INSULIN GLARGINE, 2 UNIT DIAL, (TOUJEO MAX SOLOSTAR) 300 UNIT/ML SOPN    Inject 36 Units into the skin daily.   INSULIN PEN NEEDLE (BD PEN NEEDLE MICRO U/F) 32G X 6 MM MISC    4x a day   LEVOTHYROXINE (SYNTHROID, LEVOTHROID) 175 MCG TABLET    Take 1 tablet (175 mcg total) by mouth daily before breakfast.   MELATONIN 10 MG TABS    Take 1 tablet by mouth at bedtime as needed.   METFORMIN (GLUCOPHAGE) 1000 MG TABLET    Take 1 tablet (1,000 mg total) by mouth 2 (two) times daily with a meal.   NOVOLOG FLEXPEN 100 UNIT/ML FLEXPEN    Inject 10 Units into the skin 3 (three) times daily with meals. Max daily of 40 units   PANTOPRAZOLE (PROTONIX) 40 MG TABLET    Take 1 tablet (40 mg total) by mouth 2 (two) times daily.  Modified Medications   No medications on file  Discontinued Medications   CETIRIZINE (ZYRTEC) 10 MG TABLET    Take 10 mg by mouth daily.

## 2018-12-02 ENCOUNTER — Other Ambulatory Visit: Payer: Self-pay

## 2018-12-02 ENCOUNTER — Ambulatory Visit (INDEPENDENT_AMBULATORY_CARE_PROVIDER_SITE_OTHER): Payer: BC Managed Care – PPO | Admitting: Family Medicine

## 2018-12-02 ENCOUNTER — Encounter: Payer: Self-pay | Admitting: Family Medicine

## 2018-12-02 VITALS — BP 118/72 | HR 92 | Temp 98.6°F | Ht 68.25 in | Wt 282.2 lb

## 2018-12-02 DIAGNOSIS — M722 Plantar fascial fibromatosis: Secondary | ICD-10-CM

## 2018-12-02 NOTE — Patient Instructions (Signed)
Please read handouts on Plantar Fascitis.  STRETCHING and Strengthening program critically important.  Foot massage with tennis ball. Ice massage.  Towel Scrunches: get a towel or hand towel, use toes to pick up and scrunch up the towel.  Marble pick-ups, practice picking up marbles with toes and placing into a cup  NEEDS TO BE DONE EVERY DAY  Recommended over the counter insoles. (Spenco or Hapad)  A rigid shoe with good arch support helps: Dansko (great), Jennet Maduro, Merrell No easily bendable shoes.   Tuli's heel cups

## 2018-12-14 ENCOUNTER — Other Ambulatory Visit: Payer: Self-pay

## 2018-12-14 ENCOUNTER — Other Ambulatory Visit
Admission: RE | Admit: 2018-12-14 | Discharge: 2018-12-14 | Disposition: A | Discharge status: Home / Self Care | Payer: BC Managed Care – PPO | Source: Ambulatory Visit | Attending: Gastroenterology | Admitting: Gastroenterology

## 2018-12-14 DIAGNOSIS — Z1159 Encounter for screening for other viral diseases: Secondary | ICD-10-CM | POA: Insufficient documentation

## 2018-12-14 LAB — SARS CORONAVIRUS 2 (TAT 6-24 HRS): SARS Coronavirus 2: NEGATIVE

## 2018-12-18 ENCOUNTER — Other Ambulatory Visit: Payer: Self-pay

## 2018-12-18 ENCOUNTER — Ambulatory Visit
Admission: RE | Admit: 2018-12-18 | Discharge: 2018-12-18 | Disposition: A | Discharge status: Home / Self Care | Payer: BC Managed Care – PPO | Attending: Gastroenterology | Admitting: Gastroenterology

## 2018-12-18 ENCOUNTER — Ambulatory Visit: Payer: BC Managed Care – PPO | Admitting: Certified Registered Nurse Anesthetist

## 2018-12-18 ENCOUNTER — Encounter
Admission: RE | Disposition: A | Discharge status: Home / Self Care | Payer: Self-pay | Source: Home / Self Care | Attending: Gastroenterology

## 2018-12-18 DIAGNOSIS — Z794 Long term (current) use of insulin: Secondary | ICD-10-CM | POA: Insufficient documentation

## 2018-12-18 DIAGNOSIS — M1990 Unspecified osteoarthritis, unspecified site: Secondary | ICD-10-CM | POA: Diagnosis not present

## 2018-12-18 DIAGNOSIS — Z79899 Other long term (current) drug therapy: Secondary | ICD-10-CM | POA: Insufficient documentation

## 2018-12-18 DIAGNOSIS — Z8371 Family history of colonic polyps: Secondary | ICD-10-CM | POA: Diagnosis present

## 2018-12-18 DIAGNOSIS — Z1211 Encounter for screening for malignant neoplasm of colon: Secondary | ICD-10-CM | POA: Diagnosis not present

## 2018-12-18 DIAGNOSIS — Z888 Allergy status to other drugs, medicaments and biological substances status: Secondary | ICD-10-CM | POA: Insufficient documentation

## 2018-12-18 DIAGNOSIS — D122 Benign neoplasm of ascending colon: Secondary | ICD-10-CM | POA: Diagnosis not present

## 2018-12-18 DIAGNOSIS — Z87891 Personal history of nicotine dependence: Secondary | ICD-10-CM | POA: Insufficient documentation

## 2018-12-18 DIAGNOSIS — K5730 Diverticulosis of large intestine without perforation or abscess without bleeding: Secondary | ICD-10-CM | POA: Insufficient documentation

## 2018-12-18 DIAGNOSIS — E119 Type 2 diabetes mellitus without complications: Secondary | ICD-10-CM | POA: Diagnosis not present

## 2018-12-18 DIAGNOSIS — E079 Disorder of thyroid, unspecified: Secondary | ICD-10-CM | POA: Insufficient documentation

## 2018-12-18 HISTORY — PX: COLONOSCOPY: SHX174

## 2018-12-18 HISTORY — PX: COLONOSCOPY WITH PROPOFOL: SHX5780

## 2018-12-18 LAB — GLUCOSE, CAPILLARY: Glucose-Capillary: 266 mg/dL — ABNORMAL HIGH (ref 70–99)

## 2018-12-18 LAB — HM COLONOSCOPY

## 2018-12-18 SURGERY — COLONOSCOPY WITH PROPOFOL
Anesthesia: General

## 2018-12-18 MED ORDER — PROPOFOL 10 MG/ML IV BOLUS
INTRAVENOUS | Status: DC | PRN
  Administered 2018-12-18 (×2): 50 mg via INTRAVENOUS

## 2018-12-18 MED ORDER — PROPOFOL 500 MG/50ML IV EMUL
INTRAVENOUS | Status: DC | PRN
  Administered 2018-12-18: 125 ug/kg/min via INTRAVENOUS

## 2018-12-18 MED ORDER — PROPOFOL 500 MG/50ML IV EMUL
INTRAVENOUS | Status: AC
  Filled 2018-12-18: qty 50

## 2018-12-18 MED ORDER — LIDOCAINE HCL (CARDIAC) PF 100 MG/5ML IV SOSY
PREFILLED_SYRINGE | INTRAVENOUS | Status: DC | PRN
  Administered 2018-12-18: 50 mg via INTRAVENOUS

## 2018-12-18 MED ORDER — SODIUM CHLORIDE 0.9 % IV SOLN
INTRAVENOUS | Status: DC
  Administered 2018-12-18 (×2): via INTRAVENOUS

## 2018-12-18 MED ORDER — PROPOFOL 10 MG/ML IV BOLUS
INTRAVENOUS | Status: AC
  Filled 2018-12-18: qty 20

## 2018-12-18 NOTE — H&P (Signed)
Tanya Bellows, MD [address] [place], [place], [place], [address] [address], [place], [place], [place], [address] Phone: [phone]  Fax: [phone]  Primary Care Physician:  [name], MD   Pre-Procedure History & Physical: HPI:  Tanya Graves is a 53 y.o. female is here for an colonoscopy.   Past Medical History:  Diagnosis Date  . Arthritis   . Diabetes mellitus without complication ([place])    type 2  . Thyroid disease     Past Surgical History:  Procedure Laterality Date  . APPENDECTOMY  1993  . CALCANEAL OSTEOTOMY Left 07/17/2016   Procedure: CALCANEAL OSTEOTOMY;  Surgeon: [name], MD;  Location: [facility] ORS;  Service: Orthopedics;  Laterality: Left;  . IRRIGATION AND DEBRIDEMENT ABSCESS N/A 01/24/2015   Procedure: IRRIGATION AND DEBRIDEMENT ABSCESS/GROIN ABSCESS;  Surgeon: [name], MD;  Location: [facility] ORS;  Service: General;  Laterality: N/A;  . KNEE ARTHROSCOPY W/ MENISCAL REPAIR Right 2008  . ROTATOR CUFF REPAIR Right 2015  . TONSILLECTOMY  1998    Prior to Admission medications   Medication Sig Start Date End Date Taking? Authorizing Provider  albuterol (PROVENTIL HFA;VENTOLIN HFA) 108 (90 Base) MCG/ACT inhaler Inhale 1-2 puffs into the lungs every 6 (six) hours as needed for wheezing or shortness of breath.   Yes [provider]  atorvastatin (LIPITOR) 10 MG tablet Take 1 tablet (10 mg total) by mouth daily. 07/22/18  Yes [name], MD  Continuous Blood Gluc Receiver (DEXCOM G6 RECEIVER) DEVI 1 Device by Does not apply route as directed. Use as directed for continuous glucose monitoring DX E11.65 09/10/18  Yes Shamleffer, Melanie Crazier, MD  Continuous Blood Gluc Sensor (DEXCOM G6 SENSOR) MISC 1 kit by Does not apply route as directed. Use as directed for continuous glucose monitoring-change every 10 days DX E11.65 09/10/18  Yes Shamleffer, Melanie Crazier, MD  Continuous Blood Gluc Transmit (DEXCOM G6 TRANSMITTER)  MISC 1 Device by Does not apply route as directed. Use as directed for continuous glucose monitoring-change every 3 months DX E11.65 09/10/18  Yes Shamleffer, Melanie Crazier, MD  dapagliflozin propanediol (FARXIGA) 10 MG TABS tablet Take 10 mg by mouth daily. 07/29/18  Yes Shamleffer, Melanie Crazier, MD  glucose blood (CONTOUR NEXT TEST) test strip 4 times a day 07/08/18  Yes Shamleffer, Melanie Crazier, MD  Insulin Glargine, 2 Unit Dial, (TOUJEO MAX SOLOSTAR) 300 UNIT/ML SOPN Inject 36 Units into the skin daily. 07/08/18  Yes Shamleffer, Melanie Crazier, MD  Insulin Pen Needle (BD PEN NEEDLE MICRO U/F) 32G X 6 MM MISC 4x a day 07/08/18  Yes Shamleffer, Melanie Crazier, MD  levothyroxine (SYNTHROID, LEVOTHROID) 175 MCG tablet Take 1 tablet (175 mcg total) by mouth daily before breakfast. 07/22/18  Yes [name], MD  Melatonin 10 MG TABS Take 1 tablet by mouth at bedtime as needed.   Yes [provider]  metFORMIN (GLUCOPHAGE) 1000 MG tablet Take 1 tablet (1,000 mg total) by mouth 2 (two) times daily with a meal. 07/08/18  Yes Shamleffer, Melanie Crazier, MD  NOVOLOG FLEXPEN 100 UNIT/ML FlexPen Inject 10 Units into the skin 3 (three) times daily with meals. Max daily of 40 units Patient taking differently: Inject 12-14 Units into the skin 3 (three) times daily with meals. Max daily of 40 units 07/08/18  Yes Shamleffer, Melanie Crazier, MD  pantoprazole (PROTONIX) 40 MG tablet Take 1 tablet (40 mg total) by mouth 2 (two) times daily. 07/22/18  Yes [name],  MD    Allergies as of 09/03/2018 - Review Complete 08/06/2018  Allergen Reaction Noted  . Corticosteroids Other (See Comments) 07/01/2016    Family History  Problem Relation Age of Onset  . Diabetes Father   . Heart failure Father   . Diabetes Brother   . Arthritis Mother   . Diabetes Mother   . Transient ischemic attack Mother   . Heart failure Maternal Grandmother   . Alcohol abuse Maternal Grandmother   . Diabetes  Paternal Grandmother   . Kidney failure Paternal Grandmother   . Heart failure Paternal Grandfather   . Cancer Paternal Grandfather        unknown type  . Hypertension Brother   . Breast cancer Maternal Great-grandmother     Social History   Socioeconomic History  . Marital status: Single    Spouse name: Not on file  . Number of children: 0  . Years of education: Associates  . Highest education level: Not on file  Occupational History  . Not on file  Social Needs  . Financial resource strain: Not hard at all  . Food insecurity    Worry: Not on file    Inability: Not on file  . Transportation needs    Medical: Not on file    Non-medical: Not on file  Tobacco Use  . Smoking status: Former Smoker    Packs/day: 0.25    Years: 30.00    Pack years: 7.50    Types: Cigarettes    Quit date: 08/14/2018    Years since quitting: 0.3  . Smokeless tobacco: Never Used  Substance and Sexual Activity  . Alcohol use: Yes    Comment: a couple of times a month, typically less than 2 drinks  . Drug use: No  . Sexual activity: Not Currently    Birth control/protection: Post-menopausal  Lifestyle  . Physical activity    Days per week: Not on file    Minutes per session: Not on file  . Stress: Not on file  Relationships  . Social Herbalist on phone: Not on file    Gets together: Not on file    Attends religious service: Not on file    Active member of club or organization: Not on file    Attends meetings of clubs or organizations: Not on file    Relationship status: Not on file  . Intimate partner violence    Fear of current or ex partner: Not on file    Emotionally abused: Not on file    Physically abused: Not on file    Forced sexual activity: Not on file  Other Topics Concern  . Not on file  Social History Narrative   Lives alone   Dog - Sugar   Enjoys: hiking - looking for local trails   Exercise: walking trails with dog   Diet: doing well, did weight  watchers, watching carbs    Review of Systems: See HPI, otherwise negative ROS  Physical Exam: BP 114/80   Pulse 82   Temp (!) 96.6 F (35.9 C) (Tympanic)   Ht _0  (1.727 m)   Wt 124.3 kg   LMP 06/28/2015 (Approximate)   SpO2 98%   BMI 41.66 kg/m  General:   Alert,  pleasant and cooperative in NAD Head:  Normocephalic and atraumatic. Neck:  Supple; no masses or thyromegaly. Lungs:  Clear throughout to auscultation, normal respiratory effort.    Heart:  +S1, +S2, Regular rate and rhythm,  No edema. Abdomen:  Soft, nontender and nondistended. Normal bowel sounds, without guarding, and without rebound.   Neurologic:  Alert and  oriented x4;  grossly normal neurologically.  Impression/Plan: Tanya Graves is here for an colonoscopy to be performed for family history of colon polyps .   Risks, benefits, limitations, and alternatives regarding  colonoscopy have been reviewed with the patient.  Questions have been answered.  All parties agreeable.   Tanya Bellows, MD  12/18/2018, 8:11 AM

## 2018-12-18 NOTE — Anesthesia Preprocedure Evaluation (Signed)
Anesthesia Evaluation  Patient identified by MRN, date of birth, ID band Patient awake    Reviewed: Allergy & Precautions, H&P , NPO status , Patient's Chart, lab work & pertinent test results, reviewed documented beta blocker date and time   Airway Mallampati: II   Neck ROM: full    Dental  (+) Poor Dentition   Pulmonary neg pulmonary ROS, former smoker,    Pulmonary exam normal        Cardiovascular Exercise Tolerance: Good negative cardio ROS Normal cardiovascular exam Rhythm:regular Rate:Normal     Neuro/Psych Anxiety  Neuromuscular disease negative psych ROS   GI/Hepatic Neg liver ROS, GERD  Medicated,  Endo/Other  diabetesHypothyroidism Morbid obesity  Renal/GU negative Renal ROS  negative genitourinary   Musculoskeletal   Abdominal   Peds  Hematology negative hematology ROS (+)   Anesthesia Other Findings Past Medical History: No date: Arthritis No date: Diabetes mellitus without complication ([facility])     Comment:  type 2 No date: Thyroid disease Past Surgical History: 1993: APPENDECTOMY 07/17/2016: CALCANEAL OSTEOTOMY; Left     Comment:  Procedure: CALCANEAL OSTEOTOMY;  Surgeon: [name],              MD;  Location: [facility] ORS;  Service: Orthopedics;                Laterality: Left; 01/24/2015: IRRIGATION AND DEBRIDEMENT ABSCESS; N/A     Comment:  Procedure: IRRIGATION AND DEBRIDEMENT ABSCESS/GROIN               ABSCESS;  Surgeon: [name], MD;  Location:               [facility] ORS;  Service: General;  Laterality: N/A; 2008: KNEE ARTHROSCOPY W/ MENISCAL REPAIR; Right 2015: ROTATOR CUFF REPAIR; Right 1998: TONSILLECTOMY   Reproductive/Obstetrics negative OB ROS                             Anesthesia Physical Anesthesia Plan  ASA: III  Anesthesia Plan: General   Post-op Pain Management:    Induction:   PONV Risk Score and Plan:   Airway Management Planned:    Additional Equipment:   Intra-op Plan:   Post-operative Plan:   Informed Consent: I have reviewed the patients History and Physical, chart, labs and discussed the procedure including the risks, benefits and alternatives for the proposed anesthesia with the patient or authorized representative who has indicated his/her understanding and acceptance.     Dental Advisory Given  Plan Discussed with: CRNA  Anesthesia Plan Comments:         Anesthesia Quick Evaluation

## 2018-12-18 NOTE — Transfer of Care (Signed)
Immediate Anesthesia Transfer of Care Note  Patient: Tanya Graves  Procedure(s) Performed: COLONOSCOPY WITH PROPOFOL (N/A )  Patient Location: PACU  Anesthesia Type:General  Level of Consciousness: awake, alert  and oriented  Airway & Oxygen Therapy: Patient Spontanous Breathing and Patient connected to face mask oxygen  Post-op Assessment: Report given to RN and Post -op Vital signs reviewed and stable  Post vital signs: Reviewed and stable  Last Vitals:  Vitals Value Taken Time  BP    Temp    Pulse 75 12/18/18 0847  Resp 17 12/18/18 0847  SpO2 98 % 12/18/18 0847  Vitals shown include unvalidated device data.  Last Pain:  Vitals:   12/18/18 0738  TempSrc: Tympanic  PainSc: 0-No pain         Complications: No apparent anesthesia complications

## 2018-12-18 NOTE — Op Note (Signed)
[facility] Gastroenterology Patient Name: Tanya Graves Procedure Date: 12/18/2018 8:10 AM MRN: [id number] Account #: [id number] Date of Birth: [birth year] Admit Type: Outpatient Age: 53 Room: [facility] ENDO ROOM 4 Gender: Female Note Status: Finalized Procedure:            Colonoscopy Indications:          Colon cancer screening in patient at increased risk:                        Family history of 1st-degree relative with colon polyps Providers:            [name] MD, MD Referring MD:         Jobe Marker. Einar Pheasant (Referring MD) Medicines:            Monitored Anesthesia Care Complications:        No immediate complications. Procedure:            Pre-Anesthesia Assessment:                       - Prior to the procedure, a History and Physical was                        performed, and patient medications, allergies and                        sensitivities were reviewed. The patient's tolerance of                        previous anesthesia was reviewed.                       - The risks and benefits of the procedure and the                        sedation options and risks were discussed with the                        patient. All questions were answered and informed                        consent was obtained.                       - ASA Grade Assessment: II - A patient with mild                        systemic disease.                       After obtaining informed consent, the colonoscope was                        passed under direct vision. Throughout the procedure,                        the patient's blood pressure, pulse, and oxygen                        saturations were monitored continuously. The  Colonoscope was introduced through the anus and                        advanced to the the cecum, identified by the                        appendiceal orifice, IC valve and transillumination.                        The colonoscopy was performed  with ease. The patient                        tolerated the procedure well. The quality of the bowel                        preparation was excellent. Findings:      The perianal and digital rectal examinations were normal.      A 3 mm polyp was found in the proximal ascending colon. The polyp was       sessile. The polyp was removed with a cold biopsy forceps. Resection and       retrieval were complete.      Multiple small-mouthed diverticula were found in the sigmoid colon.      The exam was otherwise without abnormality on direct and retroflexion       views. Impression:           - One 3 mm polyp in the proximal ascending colon,                        removed with a cold biopsy forceps. Resected and                        retrieved.                       - Diverticulosis in the sigmoid colon.                       - The examination was otherwise normal on direct and                        retroflexion views. Recommendation:       - Discharge patient to home (with escort).                       - Resume previous diet.                       - Continue present medications.                       - Await pathology results.                       - Repeat colonoscopy in 5 years for surveillance. Procedure Code(s):    --- Professional ---                       [phone], Colonoscopy, flexible; with biopsy, single or                        multiple Diagnosis Code(s):    ---  Professional ---                       Z83.71, Family history of colonic polyps                       K63.5, Polyp of colon                       K57.30, Diverticulosis of large intestine without                        perforation or abscess without bleeding CPT copyright 2019 American Medical Association. All rights reserved. The codes documented in this report are preliminary and upon coder review may  be revised to meet current compliance requirements. [name], MD [name] MD, MD 12/18/2018 8:45:04 AM This  report has been signed electronically. Number of Addenda: 0 Note Initiated On: 12/18/2018 8:10 AM Scope Withdrawal Time: 0 hours 14 minutes 32 seconds  Total Procedure Duration: 0 hours 23 minutes 10 seconds  Estimated Blood Loss: Estimated blood loss: none.      [facility]

## 2018-12-18 NOTE — Anesthesia Post-op Follow-up Note (Signed)
Anesthesia QCDR form completed.        

## 2018-12-21 LAB — SURGICAL PATHOLOGY

## 2018-12-22 ENCOUNTER — Other Ambulatory Visit: Payer: Self-pay

## 2018-12-22 ENCOUNTER — Other Ambulatory Visit (HOSPITAL_COMMUNITY)
Admission: RE | Admit: 2018-12-22 | Discharge: 2018-12-22 | Disposition: A | Discharge status: Home / Self Care | Payer: BC Managed Care – PPO | Source: Ambulatory Visit | Attending: Obstetrics and Gynecology | Admitting: Obstetrics and Gynecology

## 2018-12-22 ENCOUNTER — Encounter: Payer: Self-pay | Admitting: Obstetrics and Gynecology

## 2018-12-22 ENCOUNTER — Ambulatory Visit (INDEPENDENT_AMBULATORY_CARE_PROVIDER_SITE_OTHER): Payer: BC Managed Care – PPO | Admitting: Obstetrics and Gynecology

## 2018-12-22 VITALS — BP 132/94 | Ht 68.0 in | Wt 279.0 lb

## 2018-12-22 DIAGNOSIS — Z01419 Encounter for gynecological examination (general) (routine) without abnormal findings: Secondary | ICD-10-CM

## 2018-12-22 DIAGNOSIS — Z6841 Body Mass Index (BMI) 40.0 and over, adult: Secondary | ICD-10-CM

## 2018-12-22 DIAGNOSIS — N952 Postmenopausal atrophic vaginitis: Secondary | ICD-10-CM

## 2018-12-22 DIAGNOSIS — Z124 Encounter for screening for malignant neoplasm of cervix: Secondary | ICD-10-CM

## 2018-12-22 DIAGNOSIS — Z1239 Encounter for other screening for malignant neoplasm of breast: Secondary | ICD-10-CM

## 2018-12-22 MED ORDER — ESTROGENS, CONJUGATED 0.625 MG/GM VA CREA: TOPICAL_CREAM | VAGINAL | 0 refills | Status: DC

## 2018-12-22 NOTE — Progress Notes (Signed)
Gynecology Annual Exam  PCP: [name], MD  Chief Complaint:  Chief Complaint  Patient presents with  . Annual Exam    History of Present Illness:Patient is a 53 y.o. G1P0010 presents for annual exam. The patient has no complaints today.   LMP: Patient's last menstrual period was 06/28/2015 (approximate). No vaginal bleeding  The patient is not currently sexually active. She denies dyspareunia.  The patient does perform self breast exams.  There is no notable family history of breast or ovarian cancer in her family.  The patient wears seatbelts: yes.   The patient has regular exercise: no.  More sedentary with quarantine.    The patient denies current symptoms of depression.     Review of Systems: Review of Systems  Constitutional: Negative for chills and fever.  HENT: Negative for congestion.   Respiratory: Negative for cough and shortness of breath.   Cardiovascular: Negative for chest pain and palpitations.  Gastrointestinal: Negative for abdominal pain, constipation, diarrhea, heartburn, nausea and vomiting.  Genitourinary: Negative for dysuria, frequency and urgency.  Skin: Negative for itching and rash.  Neurological: Negative for dizziness and headaches.  Endo/Heme/Allergies: Negative for polydipsia.  Psychiatric/Behavioral: Negative for depression.    Past Medical History:  Past Medical History:  Diagnosis Date  . Arthritis   . Diabetes mellitus without complication ([place])    type 2  . Thyroid disease     Past Surgical History:  Past Surgical History:  Procedure Laterality Date  . APPENDECTOMY  1993  . CALCANEAL OSTEOTOMY Left 07/17/2016   Procedure: CALCANEAL OSTEOTOMY;  Surgeon: [name], MD;  Location: [facility] ORS;  Service: Orthopedics;  Laterality: Left;  . COLONOSCOPY  12/18/2018   found 1 75m polyp, going back in 5 years   . COLONOSCOPY WITH PROPOFOL N/A 12/18/2018   Procedure: COLONOSCOPY WITH PROPOFOL;  Surgeon: AJonathon Bellows MD;   [facility];  Service: Gastroenterology;  Laterality: N/A;  . IRRIGATION AND DEBRIDEMENT ABSCESS N/A 01/24/2015   Procedure: IRRIGATION AND DEBRIDEMENT ABSCESS/GROIN ABSCESS;  Surgeon: CMarlyce Huge MD;  Location: [facility] ORS;  Service: General;  Laterality: N/A;  . KNEE ARTHROSCOPY W/ MENISCAL REPAIR Right 2008  . ROTATOR CUFF REPAIR Right 2015  . TONSILLECTOMY  1998    Gynecologic History:  Patient's last menstrual period was 06/28/2015 (approximate). Last Pap: Results were: 07/13/2015 NIL and HR HPV negative  Last mammogram:02/13/2017 Results were: BI-RAD I  Obstetric History: G1P0010  Family History:  Family History  Problem Relation Age of Onset  . Diabetes Father   . Heart failure Father   . Diabetes Brother   . Arthritis Mother   . Diabetes Mother   . Transient ischemic attack Mother   . Heart failure Maternal Grandmother   . Alcohol abuse Maternal Grandmother   . Diabetes Paternal Grandmother   . Kidney failure Paternal Grandmother   . Heart failure Paternal Grandfather   . Cancer Paternal Grandfather        unknown type  . Hypertension Brother   . Breast cancer Maternal Great-grandmother     Social History:  Social History   Socioeconomic History  . Marital status: Single    Spouse name: Not on file  . Number of children: 0  . Years of education: Associates  . Highest education level: Not on file  Occupational History  . Not on file  Social Needs  . Financial resource strain: Not hard at all  . Food insecurity    Worry: Not on  file    Inability: Not on file  . Transportation needs    Medical: Not on file    Non-medical: Not on file  Tobacco Use  . Smoking status: Former Smoker    Packs/day: 0.25    Years: 30.00    Pack years: 7.50    Types: Cigarettes    Quit date: 08/14/2018    Years since quitting: 0.3  . Smokeless tobacco: Never Used  Substance and Sexual Activity  . Alcohol use: Yes    Comment: a couple of times a month,  typically less than 2 drinks  . Drug use: No  . Sexual activity: Not Currently    Birth control/protection: Post-menopausal  Lifestyle  . Physical activity    Days per week: Not on file    Minutes per session: Not on file  . Stress: Not on file  Relationships  . Social Herbalist on phone: Not on file    Gets together: Not on file    Attends religious service: Not on file    Active member of club or organization: Not on file    Attends meetings of clubs or organizations: Not on file    Relationship status: Not on file  . Intimate partner violence    Fear of current or ex partner: Not on file    Emotionally abused: Not on file    Physically abused: Not on file    Forced sexual activity: Not on file  Other Topics Concern  . Not on file  Social History Narrative   Lives alone   Dog - Sugar   Enjoys: hiking - looking for local trails   Exercise: walking trails with dog   Diet: doing well, did weight watchers, watching carbs    Allergies:  No Known Allergies  Medications: Prior to Admission medications   Medication Sig Start Date End Date Taking? Authorizing Provider  albuterol (PROVENTIL HFA;VENTOLIN HFA) 108 (90 Base) MCG/ACT inhaler Inhale 1-2 puffs into the lungs every 6 (six) hours as needed for wheezing or shortness of breath.    [provider]  atorvastatin (LIPITOR) 10 MG tablet Take 1 tablet (10 mg total) by mouth daily. 07/22/18   [name], MD  Continuous Blood Gluc Receiver (DEXCOM G6 RECEIVER) DEVI 1 Device by Does not apply route as directed. Use as directed for continuous glucose monitoring DX E11.65 09/10/18   Shamleffer, Melanie Crazier, MD  Continuous Blood Gluc Sensor (DEXCOM G6 SENSOR) MISC 1 kit by Does not apply route as directed. Use as directed for continuous glucose monitoring-change every 10 days DX E11.65 09/10/18   Shamleffer, Melanie Crazier, MD  Continuous Blood Gluc Transmit (DEXCOM G6 TRANSMITTER) MISC 1 Device by Does not  apply route as directed. Use as directed for continuous glucose monitoring-change every 3 months DX E11.65 09/10/18   Shamleffer, Melanie Crazier, MD  dapagliflozin propanediol (FARXIGA) 10 MG TABS tablet Take 10 mg by mouth daily. 07/29/18   Shamleffer, Melanie Crazier, MD  glucose blood (CONTOUR NEXT TEST) test strip 4 times a day 07/08/18   Shamleffer, Melanie Crazier, MD  Insulin Glargine, 2 Unit Dial, (TOUJEO MAX SOLOSTAR) 300 UNIT/ML SOPN Inject 36 Units into the skin daily. 07/08/18   Shamleffer, Melanie Crazier, MD  Insulin Pen Needle (BD PEN NEEDLE MICRO U/F) 32G X 6 MM MISC 4x a day 07/08/18   Shamleffer, Melanie Crazier, MD  levothyroxine (SYNTHROID, LEVOTHROID) 175 MCG tablet Take 1 tablet (175 mcg total) by mouth daily before breakfast.  07/22/18   [name], MD  Melatonin 10 MG TABS Take 1 tablet by mouth at bedtime as needed.    [provider]  metFORMIN (GLUCOPHAGE) 1000 MG tablet Take 1 tablet (1,000 mg total) by mouth 2 (two) times daily with a meal. 07/08/18   Shamleffer, Melanie Crazier, MD  NOVOLOG FLEXPEN 100 UNIT/ML FlexPen Inject 10 Units into the skin 3 (three) times daily with meals. Max daily of 40 units Patient taking differently: Inject 12-14 Units into the skin 3 (three) times daily with meals. Max daily of 40 units 07/08/18   Shamleffer, Melanie Crazier, MD  pantoprazole (PROTONIX) 40 MG tablet Take 1 tablet (40 mg total) by mouth 2 (two) times daily. 07/22/18   [name], MD    Physical Exam Vitals: Blood pressure (!) 132/94, height '5\' 8"'  (1.727 m), weight 279 lb (126.6 kg), last menstrual period 06/28/2015. Body mass index is 42.42 kg/m.   General: NAD HEENT: normocephalic, anicteric Thyroid: no enlargement, no palpable nodules Pulmonary: No increased work of breathing, CTAB Cardiovascular: RRR, distal pulses 2+ Breast: Breast symmetrical, no tenderness, no palpable nodules or masses, no skin or nipple retraction present, no nipple discharge.  No  axillary or supraclavicular lymphadenopathy. Abdomen: NABS, soft, non-tender, non-distended.  Umbilicus without lesions.  No hepatomegaly, splenomegaly or masses palpable. No evidence of hernia  Genitourinary:  External: Normal external female genitalia.  Normal urethral meatus, normal Bartholin's and Skene's glands.    Vagina: Normal vaginal mucosa, no evidence of prolapse.    Cervix: Grossly normal in appearance, no bleeding  Uterus: Non-enlarged, mobile, normal contour.  No CMT  Adnexa: ovaries non-enlarged, no adnexal masses  Rectal: deferred  Lymphatic: no evidence of inguinal lymphadenopathy Extremities: no edema, erythema, or tenderness Neurologic: Grossly intact Psychiatric: mood appropriate, affect full  Female chaperone present for pelvic and breast  portions of the physical exam     Assessment: 54 y.o. G1P0010 routine annual exam  Plan: Problem List Items Addressed This Visit    None    Visit Diagnoses    Encounter for gynecological examination without abnormal finding    -  Primary   Screening for malignant neoplasm of cervix       Relevant Orders   Cytology - PAP   Breast screening       Relevant Orders   MM 3D SCREEN BREAST BILATERAL   Vaginal atrophy       Class 3 severe obesity with serious comorbidity and body mass index (BMI) of 40.0 to 44.9 in adult, unspecified obesity type ([place])          1) Mammogram - recommend yearly screening mammogram.  Mammogram Was ordered today  2) STI screening  was notoffered and therefore not obtained  3) ASCCP guidelines and rational discussed.  Patient opts for every 3 years screening interval  4) Osteoporosis  - per USPTF routine screening DEXA at age 60  5) Routine healthcare maintenance including cholesterol, diabetes screening discussed managed by PCP  6) Colonoscopy 12/18/2018  7) Vulvovaginal atrophy - rx premarin  8) Obesity - concerned about 20lbs weight gain in past year.  Discussed phentermine as adjunct to  promote weight loss.  She has tried [employer], also considering starting [employer].  Given her co-morbidities we also discussed surgical weight loss options which she is not interested in pursuing at this time  9) Return in about 1 year (around 12/22/2019) for annual.    Malachy Mood, MD [facility] Pigeon, [facility] 12/22/2018, 9:00 AM

## 2018-12-22 NOTE — Patient Instructions (Signed)
[facility] [address] [place] [address]  [facility]  [address] [place] [address]  Phone: [phone]  

## 2018-12-28 LAB — CYTOLOGY - PAP
Diagnosis: NEGATIVE
HPV 16/18/45 genotyping: POSITIVE — AB
HPV: DETECTED — AB

## 2019-01-05 NOTE — Anesthesia Postprocedure Evaluation (Signed)
Anesthesia Post Note  Patient: Tanya Graves  Procedure(s) Performed: COLONOSCOPY WITH PROPOFOL (N/A )  Patient location during evaluation: PACU Anesthesia Type: General Level of consciousness: awake and alert Pain management: pain level controlled Vital Signs Assessment: post-procedure vital signs reviewed and stable Respiratory status: spontaneous breathing, nonlabored ventilation, respiratory function stable and patient connected to nasal cannula oxygen Cardiovascular status: blood pressure returned to baseline and stable Postop Assessment: no apparent nausea or vomiting Anesthetic complications: no     Last Vitals:  Vitals:   12/18/18 0906 12/18/18 0916  BP: 102/63 105/62  Pulse:    Resp:    Temp:    SpO2:      Last Pain:  Vitals:   12/21/18 0742  TempSrc:   PainSc: 0-No pain                 Molli Barrows

## 2019-01-10 ENCOUNTER — Encounter: Payer: Self-pay | Admitting: Gastroenterology

## 2019-01-15 ENCOUNTER — Other Ambulatory Visit: Payer: Self-pay

## 2019-01-15 ENCOUNTER — Encounter: Payer: Self-pay | Admitting: Obstetrics and Gynecology

## 2019-01-15 ENCOUNTER — Other Ambulatory Visit (HOSPITAL_COMMUNITY)
Admission: RE | Admit: 2019-01-15 | Discharge: 2019-01-15 | Disposition: A | Discharge status: Home / Self Care | Payer: BC Managed Care – PPO | Source: Ambulatory Visit | Attending: Obstetrics and Gynecology | Admitting: Obstetrics and Gynecology

## 2019-01-15 ENCOUNTER — Ambulatory Visit (INDEPENDENT_AMBULATORY_CARE_PROVIDER_SITE_OTHER): Payer: BC Managed Care – PPO | Admitting: Obstetrics and Gynecology

## 2019-01-15 VITALS — BP 126/84 | HR 103 | Ht 68.0 in | Wt 276.0 lb

## 2019-01-15 DIAGNOSIS — R87810 Cervical high risk human papillomavirus (HPV) DNA test positive: Secondary | ICD-10-CM

## 2019-01-15 NOTE — Progress Notes (Signed)
   GYNECOLOGY CLINIC COLPOSCOPY PROCEDURE NOTE  53 y.o. G1P0010 here for colposcopy for NIL HPV positive (16/18 positive) pap smear on 12/22/2018. Discussed underlying role for HPV infection in the development of cervical dysplasia, its natural history and progression/regression, need for surveillance.  Is the patient  pregnant: No LMP: Patient's last menstrual period was 06/28/2015 (approximate). Smoking status:  reports that she quit smoking about 5 months ago. Her smoking use included cigarettes. She has a 7.50 pack-year smoking history. She has never used smokeless tobacco.  Patient given informed consent, signed copy in the chart, time out was performed.  The patient was position in dorsal lithotomy position. Speculum was placed the cervix was visualized.   After application of acetic acid colposcopic inspection of the cervix was undertaken.   Colposcopy adequate, full visualization of transformation zone: Yes no visible lesions; corresponding biopsies obtained.   ECC specimen obtained:  Yes  All specimens were labeled and sent to pathology.   Patient was given post procedure instructions.  Will follow up pathology and manage accordingly.  Routine preventative health maintenance measures emphasized.  OBGyn Exam  Malachy Mood, MD, [facility], [facility]

## 2019-01-18 ENCOUNTER — Other Ambulatory Visit: Payer: Self-pay

## 2019-01-18 ENCOUNTER — Emergency Department: Admission: EM | Admit: 2019-01-18 | Discharge: 2019-01-18 | Discharge status: Absent without leave | Payer: Self-pay

## 2019-01-18 ENCOUNTER — Ambulatory Visit (INDEPENDENT_AMBULATORY_CARE_PROVIDER_SITE_OTHER): Payer: Self-pay

## 2019-01-18 ENCOUNTER — Other Ambulatory Visit: Payer: Self-pay | Admitting: Gerontology

## 2019-01-18 DIAGNOSIS — R52 Pain, unspecified: Secondary | ICD-10-CM

## 2019-01-22 ENCOUNTER — Other Ambulatory Visit: Payer: Self-pay

## 2019-01-22 ENCOUNTER — Other Ambulatory Visit: Payer: Self-pay | Admitting: Internal Medicine

## 2019-03-22 ENCOUNTER — Encounter: Payer: BC Managed Care – PPO | Admitting: Family Medicine

## 2019-03-24 ENCOUNTER — Other Ambulatory Visit: Payer: Self-pay | Admitting: Internal Medicine

## 2019-03-25 ENCOUNTER — Ambulatory Visit (INDEPENDENT_AMBULATORY_CARE_PROVIDER_SITE_OTHER): Payer: BC Managed Care – PPO | Admitting: Family Medicine

## 2019-03-25 ENCOUNTER — Other Ambulatory Visit: Payer: Self-pay

## 2019-03-25 VITALS — BP 108/70 | HR 92 | Temp 98.0°F | Resp 18 | Ht 68.0 in | Wt 271.8 lb

## 2019-03-25 DIAGNOSIS — Z23 Encounter for immunization: Secondary | ICD-10-CM

## 2019-03-25 DIAGNOSIS — E1169 Type 2 diabetes mellitus with other specified complication: Secondary | ICD-10-CM

## 2019-03-25 DIAGNOSIS — E1165 Type 2 diabetes mellitus with hyperglycemia: Secondary | ICD-10-CM | POA: Diagnosis not present

## 2019-03-25 DIAGNOSIS — Z0001 Encounter for general adult medical examination with abnormal findings: Secondary | ICD-10-CM | POA: Diagnosis not present

## 2019-03-25 DIAGNOSIS — K219 Gastro-esophageal reflux disease without esophagitis: Secondary | ICD-10-CM | POA: Diagnosis not present

## 2019-03-25 DIAGNOSIS — E039 Hypothyroidism, unspecified: Secondary | ICD-10-CM

## 2019-03-25 DIAGNOSIS — E785 Hyperlipidemia, unspecified: Secondary | ICD-10-CM

## 2019-03-25 DIAGNOSIS — H9193 Unspecified hearing loss, bilateral: Secondary | ICD-10-CM | POA: Insufficient documentation

## 2019-03-25 MED ORDER — ESTROGENS, CONJUGATED 0.625 MG/GM VA CREA: TOPICAL_CREAM | VAGINAL | 1 refills | Status: DC

## 2019-03-25 MED ORDER — PANTOPRAZOLE SODIUM 40 MG PO TBEC: 40.0000 mg | DELAYED_RELEASE_TABLET | Freq: Two times a day (BID) | ORAL | 3 refills | Status: DC

## 2019-03-25 MED ORDER — ALBUTEROL SULFATE HFA 108 (90 BASE) MCG/ACT IN AERS: 1.0000 | INHALATION_SPRAY | Freq: Four times a day (QID) | RESPIRATORY_TRACT | 3 refills | Status: DC | PRN

## 2019-03-25 NOTE — Patient Instructions (Signed)
#  Diabetes - call to make an appointment with endocrinology to check in - continue working on diet  When your foot is better, try to start a regular exercise program to help  #Heartburn - restart protonix

## 2019-03-25 NOTE — Addendum Note (Signed)
Addended by: [name] on: 03/25/2019 04:45 PM   Modules accepted: Orders

## 2019-03-25 NOTE — Assessment & Plan Note (Signed)
On statin. Will check lipids to see if need for increased dose

## 2019-03-25 NOTE — Assessment & Plan Note (Addendum)
Follows with endocrine. Last eye exam was 2018. Encouraged eye exam. Urine microalbumin today. Hypersensitive foot exam.

## 2019-03-25 NOTE — Assessment & Plan Note (Signed)
Normal screening today for hearing, however, given concerns from several people will refer for formal evaluation

## 2019-03-25 NOTE — Addendum Note (Signed)
Addended by: Kris Mouton on: 03/25/2019 04:24 PM   Modules accepted: Orders

## 2019-03-25 NOTE — Progress Notes (Addendum)
Annual Exam   Chief Complaint:  Chief Complaint  Patient presents with  . Annual Exam    History of Present Illness:  Ms. LAIYLAH ROETTGER is a 53 y.o. G1P0010 who LMP was Patient's last menstrual period was 06/28/2015 (approximate)., presents today for her annual examination.     Nutrition She does get adequate calcium and Vitamin D in her diet. Diet: not great with diabetic diet, working on trying not to eat with boreddom Exercise: recently broke her foot, so not a lot of exercise  Safety The patient wears seatbelts: yes.     The patient feels safe at home and in their relationships: yes.   Menstrual Post menopausal  GYN She is not sexually active.  premarin cream for thinning and vaginal dryness  Cervical Cancer Screening:   Last Pap: up to date with GYN  Breast Cancer Screening There is FH of breast cancer. There is no FH of ovarian cancer. BRCA screening Not Indicated.  Last Mammogram: 01/2017 The patient does want a mammogram this year.    Colon Cancer Screening Age 57-75 yo - benefits outweigh the risk. Adults 65-85 yo who have never been screened benefit.  Benefits: 134000 people in 2016 will be diagnosed and 49,000 will die - early detection helps [place]: Complications 2/2 to colonoscopy High Risk (Colonoscopy): genetic disorder (Lynch syndrome or familial adenomatous polyposis), personal hx of IBD, previous adenomatous polyp, or previous colorectal cancer, FamHx start 10 years before the age at diagnosis, increased in males and black race  Options:  FIT - looks for hemoglobin (blood in the stool) - specific and fairly sensitive - must be done annually Cologuard - looks for DNA and blood - more sensitive - therefore can have more false positives, every 3 years Colonoscopy - every 10 years if normal - sedation, bowl prep, must have someone drive you  Shared decision making and the patient had decided to do colonoscopy, already up to date.  Lung Cancer  screening Not yet 53 yo. Quit smoking in 07/2018  Weight Wt Readings from Last 3 Encounters:  03/25/19 271 lb 12 oz (123.3 kg)  01/15/19 276 lb (125.2 kg)  12/22/18 279 lb (126.6 kg)   Patient has very high BMI  BMI Readings from Last 1 Encounters:  03/25/19 41.32 kg/m     Chronic disease screening Blood pressure monitoring:  BP Readings from Last 3 Encounters:  03/25/19 108/70  01/15/19 126/84  12/22/18 (!) 132/94    Lipid Monitoring: Indication for screening: age >27, obesity, diabetes, family hx, CV risk factors.  Lipid screening: Yes  Lab Results  Component Value Date   CHOL 232 (A) 03/17/2018   HDL 66 03/17/2018   LDLCALC 139 03/17/2018   TRIG 142 03/17/2018     Diabetes Screening: age >25, overweight, family hx, PCOS, hx of gestational diabetes, at risk ethnicity Diabetes Screening screening: Yes  Lab Results  Component Value Date   HGBA1C 9.9 (A) 08/28/2018    - follows with endocrine - overdue for visit - will schedule - also recently had more boils which have required I&D   Past Medical History:  Diagnosis Date  . Arthritis   . Diabetes mellitus without complication ([place])    type 2  . Thyroid disease     Past Surgical History:  Procedure Laterality Date  . APPENDECTOMY  1993  . CALCANEAL OSTEOTOMY Left 07/17/2016   Procedure: CALCANEAL OSTEOTOMY;  Surgeon: [name], MD;  Location: [facility] ORS;  Service: Orthopedics;  Laterality: Left;  .  COLONOSCOPY  12/18/2018   found 1 75m polyp, going back in 5 years   . COLONOSCOPY WITH PROPOFOL N/A 12/18/2018   Procedure: COLONOSCOPY WITH PROPOFOL;  Surgeon: AJonathon Bellows MD;  [facility];  Service: Gastroenterology;  Laterality: N/A;  . IRRIGATION AND DEBRIDEMENT ABSCESS N/A 01/24/2015   Procedure: IRRIGATION AND DEBRIDEMENT ABSCESS/GROIN ABSCESS;  Surgeon: CMarlyce Huge MD;  Location: [facility] ORS;  Service: General;  Laterality: N/A;  . KNEE ARTHROSCOPY W/ MENISCAL REPAIR Right 2008   . ROTATOR CUFF REPAIR Right 2015  . TONSILLECTOMY  1998     No Known Allergies  Gynecologic History: Patient's last menstrual period was 06/28/2015 (approximate).  Obstetric History: G1P0010  Social History   Socioeconomic History  . Marital status: Single    Spouse name: Not on file  . Number of children: 0  . Years of education: Associates  . Highest education level: Not on file  Occupational History  . Not on file  Social Needs  . Financial resource strain: Not hard at all  . Food insecurity    Worry: Not on file    Inability: Not on file  . Transportation needs    Medical: Not on file    Non-medical: Not on file  Tobacco Use  . Smoking status: Former Smoker    Packs/day: 0.25    Years: 30.00    Pack years: 7.50    Types: Cigarettes    Quit date: 08/14/2018    Years since quitting: 0.6  . Smokeless tobacco: Never Used  Substance and Sexual Activity  . Alcohol use: Yes    Comment: a couple of times a month, typically less than 2 drinks  . Drug use: No  . Sexual activity: Not Currently    Birth control/protection: Post-menopausal  Lifestyle  . Physical activity    Days per week: Not on file    Minutes per session: Not on file  . Stress: Not on file  Relationships  . Social [place] phone: Not on file    Gets together: Not on file    Attends religious service: Not on file    Active member of club or organization: Not on file    Attends meetings of clubs or organizations: Not on file    Relationship status: Not on file  . Intimate partner violence    Fear of current or ex partner: Not on file    Emotionally abused: Not on file    Physically abused: Not on file    Forced sexual activity: Not on file  Other Topics Concern  . Not on file  Social History Narrative   Lives alone   Dog - Sugar   Enjoys: hiking - looking for local trails   Exercise: walking trails with dog   Diet: doing well, did weight watchers, watching carbs    Family  History  Problem Relation Age of Onset  . Diabetes Father   . Heart failure Father   . Diabetes Brother   . Arthritis Mother   . Diabetes Mother   . Transient ischemic attack Mother   . Heart failure Maternal Grandmother   . Alcohol abuse Maternal Grandmother   . Diabetes Paternal Grandmother   . Kidney failure Paternal Grandmother   . Heart failure Paternal Grandfather   . Cancer Paternal Grandfather        unknown type  . Hypertension Brother   . Breast cancer Maternal Great-grandmother     Review of Systems  Constitutional: Negative for chills and fever.  HENT: Negative.   Eyes: Negative for blurred vision.  Respiratory: Negative.   Cardiovascular: Negative.   Gastrointestinal: Positive for heartburn.  Genitourinary: Negative.   Musculoskeletal: Negative.   Skin:       Recurrent boils  Neurological: Negative.   Endo/Heme/Allergies: Negative.   Psychiatric/Behavioral: The patient has insomnia.      Physical Exam BP 108/70   Pulse 92   Temp 98 F (36.7 C)   Resp 18   Ht '5\' 8"'  (1.727 m)   Wt 271 lb 12 oz (123.3 kg)   LMP 06/28/2015 (Approximate)   SpO2 96%   BMI 41.32 kg/m    BP Readings from Last 3 Encounters:  03/25/19 108/70  01/15/19 126/84  12/22/18 (!) 132/94      Physical Exam Constitutional:      General: She is not in acute distress.    Appearance: She is well-developed. She is obese. She is not diaphoretic.  HENT:     Head: Normocephalic and atraumatic.     Right Ear: External ear normal.     Left Ear: External ear normal.     Nose: Nose normal.  Eyes:     General: No scleral icterus.    Conjunctiva/sclera: Conjunctivae normal.  Neck:     Musculoskeletal: Neck supple.  Cardiovascular:     Rate and Rhythm: Normal rate and regular rhythm.     Heart sounds: No murmur.  Pulmonary:     Effort: Pulmonary effort is normal. No respiratory distress.     Breath sounds: Normal breath sounds. No wheezing.  Abdominal:     General: Bowel sounds  are normal. There is no distension.     Palpations: Abdomen is soft. There is no mass.     Tenderness: There is no abdominal tenderness. There is no guarding or rebound.  Musculoskeletal: Normal range of motion.  Lymphadenopathy:     Cervical: No cervical adenopathy.  Skin:    General: Skin is warm and dry.     Capillary Refill: Capillary refill takes less than 2 seconds.  Neurological:     Mental Status: She is alert and oriented to person, place, and time.     Deep Tendon Reflexes: Reflexes normal.  Psychiatric:        Behavior: Behavior normal.        Results:  PHQ-9:    Office Visit from 07/22/2018 in [place] at [facility]  PHQ-9 Total Score  1        Assessment: 53 y.o. G47P0010 female here for routine annual physical examination.  Plan: Problem List Items Addressed This Visit      Digestive   GERD (gastroesophageal reflux disease)   Relevant Medications   pantoprazole (PROTONIX) 40 MG tablet     Endocrine   Poorly controlled type 2 diabetes mellitus ([place])    Follows with endocrine. Last eye exam was 2018. Encouraged eye exam. Urine microalbumin today. Hypersensitive foot exam.       Relevant Orders   Comprehensive metabolic panel   Lipid panel   Hemoglobin A1c   Microalbumin / creatinine urine ratio   Hypothyroidism   Hyperlipidemia associated with type 2 diabetes mellitus ([place])    On statin. Will check lipids to see if need for increased dose        Other   Hearing difficulty of both ears    Normal screening today for hearing, however, given concerns from several people will refer for formal evaluation  Relevant Orders   Ambulatory referral to Audiology    Other Visit Diagnoses    Annual visit for general adult medical examination with abnormal findings    -  Primary      Screening: -- Blood pressure screen normal -- cholesterol screening: will obtain -- Weight screening: obese: discussed management options, including  lifestyle, dietary, and exercise. -- Diabetes Screening: pt with diabetes -- Nutrition: working on weight loss  The 10-year ASCVD risk score Mikey Bussing DC Brooke Bonito., et al., 2013) is: 5.4%*   Values used to calculate the score:     Age: 77 years     Sex: Female     Is Non-Hispanic African American: No     Diabetic: Yes     Tobacco smoker: Yes     Systolic Blood Pressure: 091 mmHg     Is BP treated: No     HDL Cholesterol: 66 mg/dL*     Total Cholesterol: 232 mg/dL*     * - Cholesterol units were assumed for this score calculation  -- Statin therapy for Age 42-75 with CVD risk >7.5%  Psych -- Depression screening (PHQ-9):    Office Visit from 07/22/2018 in [place] at [facility]  PHQ-9 Total Score  1       Safety -- tobacco screening: not using -- alcohol screening:  low-risk usage. -- no evidence of domestic violence or intimate partner violence.   Cancer Screening -- pap smear up to date per GYN per ASCCP guidelines -- family history of breast cancer screening: done. not at high risk. -- Mammogram - already scheduled  Immunizations -- flu vaccine done today -- TDAP q10 years done today -- PPSV-23 (19-64 with chronic disease or smoking) up to date -- Shingles: done today   [name], MD

## 2019-03-26 LAB — LIPID PANEL
Cholesterol: 157 mg/dL (ref 0–200)
HDL: 46.9 mg/dL (ref >39.00)
NonHDL: 110.15
Total CHOL/HDL Ratio: 3
Triglycerides: 202 mg/dL — ABNORMAL HIGH (ref 0.0–149.0)
VLDL: 40.4 mg/dL — ABNORMAL HIGH (ref 0.0–40.0)

## 2019-03-26 LAB — COMPREHENSIVE METABOLIC PANEL
ALT: 11 U/L (ref 0–35)
AST: 14 U/L (ref 0–37)
Albumin: 4.3 g/dL (ref 3.5–5.2)
Alkaline Phosphatase: 98 U/L (ref 39–117)
BUN: 20 mg/dL (ref 6–23)
CO2: 26 mEq/L (ref 19–32)
Calcium: 9.3 mg/dL (ref 8.4–10.5)
Chloride: 102 mEq/L (ref 96–112)
Creatinine, Ser: 0.99 mg/dL (ref 0.40–1.20)
GFR: 58.85 mL/min — ABNORMAL LOW (ref >60.00)
Glucose, Bld: 313 mg/dL — ABNORMAL HIGH (ref 70–99)
Potassium: 4.5 mEq/L (ref 3.5–5.1)
Sodium: 137 mEq/L (ref 135–145)
Total Bilirubin: 0.4 mg/dL (ref 0.2–1.2)
Total Protein: 6.9 g/dL (ref 6.0–8.3)

## 2019-03-26 LAB — LDL CHOLESTEROL, DIRECT: Direct LDL: 88 mg/dL

## 2019-03-26 LAB — MICROALBUMIN / CREATININE URINE RATIO
Creatinine,U: 46.8 mg/dL
Microalb Creat Ratio: 1.5 mg/g (ref 0.0–30.0)
Microalb, Ur: <0.7 mg/dL (ref 0.0–1.9)

## 2019-03-26 LAB — HEMOGLOBIN A1C: Hgb A1c MFr Bld: 11.2 % — ABNORMAL HIGH (ref 4.6–6.5)

## 2019-03-29 ENCOUNTER — Telehealth: Payer: Self-pay | Admitting: Family Medicine

## 2019-03-29 NOTE — Telephone Encounter (Signed)
Called to discuss lab results.   Checks sugars has noticed that they are high.   On insulin and managed by endocrinology  Pt will call endocrine to make an appointment and to discuss adjustments to medication   Reviewed normal CMP and lipids  Continue statin

## 2019-05-14 ENCOUNTER — Other Ambulatory Visit: Payer: Self-pay | Admitting: Internal Medicine

## 2019-05-23 ENCOUNTER — Other Ambulatory Visit: Payer: Self-pay | Admitting: Family Medicine

## 2019-05-24 NOTE — Telephone Encounter (Signed)
Spoke with patient. Using cream once weekly. Does need a refill.

## 2019-05-26 ENCOUNTER — Other Ambulatory Visit: Payer: Self-pay | Admitting: Internal Medicine

## 2019-05-26 DIAGNOSIS — E1165 Type 2 diabetes mellitus with hyperglycemia: Secondary | ICD-10-CM

## 2019-05-31 ENCOUNTER — Encounter: Payer: Self-pay | Admitting: Internal Medicine

## 2019-05-31 ENCOUNTER — Other Ambulatory Visit: Payer: Self-pay | Admitting: Internal Medicine

## 2019-05-31 ENCOUNTER — Other Ambulatory Visit: Payer: Self-pay | Admitting: Family Medicine

## 2019-05-31 DIAGNOSIS — E1165 Type 2 diabetes mellitus with hyperglycemia: Secondary | ICD-10-CM

## 2019-05-31 MED ORDER — DEXCOM G6 RECEIVER DEVI: 1.0000 | 1 refills | Status: DC

## 2019-06-04 ENCOUNTER — Other Ambulatory Visit: Payer: Self-pay

## 2019-06-08 ENCOUNTER — Ambulatory Visit (INDEPENDENT_AMBULATORY_CARE_PROVIDER_SITE_OTHER): Payer: BC Managed Care – PPO | Admitting: Internal Medicine

## 2019-06-08 VITALS — BP 128/78 | HR 86 | Temp 97.9°F | Ht 68.0 in | Wt 280.4 lb

## 2019-06-08 DIAGNOSIS — E1165 Type 2 diabetes mellitus with hyperglycemia: Secondary | ICD-10-CM | POA: Diagnosis not present

## 2019-06-08 DIAGNOSIS — E1142 Type 2 diabetes mellitus with diabetic polyneuropathy: Secondary | ICD-10-CM

## 2019-06-08 DIAGNOSIS — Z794 Long term (current) use of insulin: Secondary | ICD-10-CM | POA: Diagnosis not present

## 2019-06-08 LAB — POCT GLYCOSYLATED HEMOGLOBIN (HGB A1C): Hemoglobin A1C: 9.5 % — AB (ref 4.0–5.6)

## 2019-06-08 NOTE — Patient Instructions (Addendum)
-   Decrease  toujeo to 34  units daily  - Novolog 10 units with each meal , If  Pre-meal is over 200 mg/dL, Add 2 more units of Novolog to that meal - Continue Metformin 1000 mg Twice a day  - Farxiga 10 mg daily    HOW TO TREAT LOW BLOOD SUGARS (Blood sugar LESS THAN 70 MG/DL)  Please follow the RULE OF 15 for the treatment of hypoglycemia treatment (when your (blood sugars are less than 70 mg/dL)    STEP 1: Take 15 grams of carbohydrates when your blood sugar is low, which includes:   3-4 GLUCOSE TABS  OR  3-4 OZ OF JUICE OR REGULAR SODA OR  ONE TUBE OF GLUCOSE GEL     STEP 2: RECHECK blood sugar in 15 MINUTES STEP 3: If your blood sugar is still low at the 15 minute recheck --> then, go back to STEP 1 and treat AGAIN with another 15 grams of carbohydrates.

## 2019-06-08 NOTE — Progress Notes (Signed)
Name: Tanya Graves  Age/ Sex: 54 y.o., female   MRN/ DOB: [id number], [birth year]     PCP: [name], MD   Reason for Endocrinology Evaluation: Type 2 Diabetes Mellitus  Initial Endocrine Consultative Visit: 07/08/2018    PATIENT IDENTIFIER: Tanya Graves is a 54 y.o. female with a past medical history of T2DM, HTN, Dyslipidemia and hypothyroidism . The patient has followed with Endocrinology clinic since 07/08/2018 for consultative assistance with management of her diabetes.  DIABETIC HISTORY:  Ms. Shott was diagnosed with T1DM ~ 2010.  She does not recall intolerance to other oral glycemic agents.  On her initial visit here she was on MDI regimen of insulin and metformin. Her hemoglobin A1c has ranged from 11.4%  in 2016, peaking at 12.5% in 2019   Thyroid Disease  She is S/P RAI ablation years ago > 20 yrs ago and has been on levothyroxine . She is on Levothyroxine 175 mcg daily   SUBJECTIVE:   During the last visit (09/09/2018): this was a virtual visit. We reduced MDI regimen doses, continued Metformin and Farxiga   Today (06/09/2019): Ms. Harken for 2-week follow-up on diabetes management.  She checks her blood sugars 4 times daily, preprandial and bedtime. The patient has not had hypoglycemic episodes since the last clinic visit. Otherwise, the patient has not required any recent emergency interventions for hypoglycemia and has not had recent hospitalizations secondary to hyper or hypoglycemic episodes.     ROS: As per HPI and as detailed below: Review of Systems  Respiratory: Negative for cough and shortness of breath.   Cardiovascular: Negative for chest pain and palpitations.  Gastrointestinal: Positive for nausea. Negative for constipation.  Genitourinary: Negative for frequency.  Endo/Heme/Allergies: Negative for polydipsia.      HOME DIABETES REGIMEN:   Metformin 1000 mg twice daily with meals  Toujeo 34 units daily- takes 40 units   NovoLog <  100 mg/dL - skips it                        100-200 - 7 units                          > 200 10 units   Farxiga 10 mg daily     CONTINUOUS GLUCOSE MONITORING RECORD INTERPRETATION    Dates of Recording: 12/30-1/04/2020  Sensor description: Dexcom  Results statistics:   CGM use % of time 100  Average and SD 186/78  Time in range    52    %  % Time Above 180 24  % Time above 250 22  % Time Below target 1    Glycemic patterns summary: Hyperglycemia during the day with hypoglycemia overnight   Hyperglycemic episodes  Post breakfast and Dinner   Hypoglycemic episodes occurred overnight      HISTORY:  Past Medical History:  Past Medical History:  Diagnosis Date  . Arthritis   . Diabetes mellitus without complication ([place])    type 2  . Thyroid disease    Past Surgical History:  Past Surgical History:  Procedure Laterality Date  . APPENDECTOMY  1993  . CALCANEAL OSTEOTOMY Left 07/17/2016   Procedure: CALCANEAL OSTEOTOMY;  Surgeon: [name], MD;  Location: [facility] ORS;  Service: Orthopedics;  Laterality: Left;  . COLONOSCOPY  12/18/2018   found 1 31mm polyp, going back in 5 years   . COLONOSCOPY WITH PROPOFOL N/A 12/18/2018  Procedure: COLONOSCOPY WITH PROPOFOL;  Surgeon: [name], MD;  Location: [facility];  Service: Gastroenterology;  Laterality: N/A;  . IRRIGATION AND DEBRIDEMENT ABSCESS N/A 01/24/2015   Procedure: IRRIGATION AND DEBRIDEMENT ABSCESS/GROIN ABSCESS;  Surgeon: [name], MD;  Location: [facility] ORS;  Service: General;  Laterality: N/A;  . KNEE ARTHROSCOPY W/ MENISCAL REPAIR Right 2008  . ROTATOR CUFF REPAIR Right 2015  . TONSILLECTOMY  1998    Social History:  reports that she quit smoking about 9 months ago. Her smoking use included cigarettes. She has a 7.50 pack-year smoking history. She has never used smokeless tobacco. She reports current alcohol use. She reports that she does not use drugs. Family History:  Family History    Problem Relation Age of Onset  . Diabetes Father   . Heart failure Father   . Diabetes Brother   . Arthritis Mother   . Diabetes Mother   . Transient ischemic attack Mother   . Heart failure Maternal Grandmother   . Alcohol abuse Maternal Grandmother   . Diabetes Paternal Grandmother   . Kidney failure Paternal Grandmother   . Heart failure Paternal Grandfather   . Cancer Paternal Grandfather        unknown type  . Hypertension Brother   . Breast cancer Maternal Great-grandmother      HOME MEDICATIONS:    OBJECTIVE:   Vital Signs: BP 128/78 (BP Location: Left Arm, Patient Position: Sitting, Cuff Size: Large)   Pulse 86   Temp 97.9 F (36.6 C)   Ht 5\' 8"  (1.727 m)   Wt 280 lb 6.4 oz (127.2 kg)   LMP 06/28/2015 (Approximate)   SpO2 98%   BMI 42.63 kg/m   Wt Readings from Last 3 Encounters:  06/08/19 280 lb 6.4 oz (127.2 kg)  03/25/19 271 lb 12 oz (123.3 kg)  01/15/19 276 lb (125.2 kg)     Exam: General: Pt appears well and is in NAD  Neck: General: Supple without adenopathy. Thyroid: Thyroid size normal.  No goiter or nodules appreciated. No thyroid bruit.  Lungs: Clear with good BS bilat with no rales, rhonchi, or wheezes  Heart: RRR with normal S1 and S2 and no gallops; no murmurs; no rub  Abdomen: Normoactive bowel sounds, soft, nontender, without masses or organomegaly palpable  Extremities: No pretibial edema.   Skin: Normal texture and temperature to palpation.   Neuro: MS is good with appropriate affect, pt is alert and Ox3   DM foot exam: 06/08/2019 The skin of the feet is intact without sores or ulcerations. The pedal pulses are 2+ on right and 2+ on left. The sensation is intact to a screening 5.07, 10 gram monofilament bilaterally     DATA REVIEWED:  Lab Results  Component Value Date   HGBA1C 9.5 (A) 06/08/2019   HGBA1C 11.2 (H) 03/25/2019   HGBA1C 9.9 (A) 08/28/2018   Lab Results  Component Value Date   MICROALBUR <0.7 03/25/2019    LDLCALC 139 03/17/2018   CREATININE 0.99 03/25/2019   Lab Results  Component Value Date   MICRALBCREAT 1.5 03/25/2019    No results found for: FRUCTOSAMINE   Lab Results  Component Value Date   CHOL 157 03/25/2019   HDL 46.90 03/25/2019   LDLCALC 139 03/17/2018   LDLDIRECT 88.0 03/25/2019   TRIG 202.0 (H) 03/25/2019   CHOLHDL 3 03/25/2019         ASSESSMENT / PLAN / RECOMMENDATIONS:  1)Type 2 Diabetes Mellitus, Poorly controlled, With Neuropathic complications - Most  recent A1c of 9.5 %. Goal A1c < 7.0 %.   - Poorly controlled diabetes due to dietary indiscretions and medication non-adherence. Pt does admit to forgetting to take prandial insulin before meals at time.  - She has not been here in 9 months, and somehow over time, she has increased basal insulin but was taking insufficient amounts of prandial insulin which is resulting in the current pattern of glycemic control.  - She also  Continues to have genital boils, which I have explained is due to a combination of probably farxiga and hyperglycemia. I have advised her that it may be a good idea to stop [place] and monitor those boils, but at this time she would like to continue with [place] and believes these boils have been reduced due to certain lifestyle changes.  - She was encouraged to schedule an eye exam     MEDICATIONS:  Continue metformin 1000 mg twice daily with meals  Farxiga 10 mg daily   Decrease Toujeo 34 units daily  Increase NovoLog to 10 units with each meal  CF : Add 2 units of Novolog to BG's > 200 mg/dL     EDUCATION / INSTRUCTIONS:  BG monitoring instructions: Patient is instructed to check her blood sugars 4 times a day, before meals and bedtime.  Call [place] [facility] if: BG persistently < 70 or > 300. . I reviewed the Rule of 15 for the treatment of hypoglycemia in detail with the patient. Literature supplied.   F/U in 3 months    Signed electronically by: Mack Guise, MD  [facility]  [address] [place], [place] [address] Phone: [phone] FAX: [phone]   CC: [name], [place] [address] Phone: [phone]  Fax: [phone]  Return to Endocrinology clinic as below: Future Appointments  Date Time Provider [place]  06/22/2019  3:15 PM [facility] NURSE [facility]  09/06/2019  3:20 PM Akiyah Eppolito, Melanie Crazier, MD [facility] None  09/27/2019  8:00 AM [name], MD [facility]

## 2019-06-09 ENCOUNTER — Encounter: Payer: Self-pay | Admitting: Internal Medicine

## 2019-06-22 ENCOUNTER — Other Ambulatory Visit: Payer: Self-pay

## 2019-06-22 ENCOUNTER — Ambulatory Visit (INDEPENDENT_AMBULATORY_CARE_PROVIDER_SITE_OTHER): Payer: BC Managed Care – PPO | Admitting: *Deleted

## 2019-06-22 DIAGNOSIS — Z23 Encounter for immunization: Secondary | ICD-10-CM | POA: Diagnosis not present

## 2019-06-22 NOTE — Progress Notes (Signed)
Per orders of Dr. Einar Pheasant, injection of Shingrix (#2) given by Virl Cagey. Patient tolerated injection well.

## 2019-06-23 ENCOUNTER — Telehealth: Payer: Self-pay

## 2019-06-23 NOTE — Telephone Encounter (Signed)
FYI: Pt received  Her 2nd Shingles vaccine yesterday. Today, she is feeling bad with body aches and fatigue. She is going home from work. Several co-workers said they had the same reaction to their 2nd one. I told her it was okay for her to take Tylenol or Advil if it is not contraindicated for anything else she has.

## 2019-06-24 NOTE — Telephone Encounter (Signed)
Agree with recommendation

## 2019-06-29 ENCOUNTER — Other Ambulatory Visit: Payer: Self-pay

## 2019-06-29 DIAGNOSIS — E1165 Type 2 diabetes mellitus with hyperglycemia: Secondary | ICD-10-CM

## 2019-06-29 MED ORDER — DEXCOM G6 TRANSMITTER MISC: 1.0000 | 3 refills | Status: DC

## 2019-07-13 ENCOUNTER — Other Ambulatory Visit: Payer: Self-pay | Admitting: Internal Medicine

## 2019-07-23 ENCOUNTER — Other Ambulatory Visit: Payer: Self-pay

## 2019-07-23 MED ORDER — PREMARIN 0.625 MG/GM VA CREA: TOPICAL_CREAM | VAGINAL | 1 refills | Status: DC

## 2019-07-23 NOTE — Telephone Encounter (Signed)
Last refilled on 05/24/2019 #42.5 g with 0 refill.  LOV 03/25/2019 CPE Next appointment on 09/27/2019

## 2019-08-15 ENCOUNTER — Other Ambulatory Visit: Payer: Self-pay | Admitting: Internal Medicine

## 2019-08-17 ENCOUNTER — Other Ambulatory Visit: Payer: Self-pay | Admitting: Family Medicine

## 2019-08-17 ENCOUNTER — Other Ambulatory Visit: Payer: Self-pay | Admitting: Internal Medicine

## 2019-08-17 DIAGNOSIS — E1169 Type 2 diabetes mellitus with other specified complication: Secondary | ICD-10-CM

## 2019-08-17 DIAGNOSIS — E785 Hyperlipidemia, unspecified: Secondary | ICD-10-CM

## 2019-09-02 ENCOUNTER — Other Ambulatory Visit: Payer: Self-pay

## 2019-09-06 ENCOUNTER — Other Ambulatory Visit: Payer: Self-pay

## 2019-09-06 ENCOUNTER — Encounter: Payer: Self-pay | Admitting: Internal Medicine

## 2019-09-06 ENCOUNTER — Ambulatory Visit (INDEPENDENT_AMBULATORY_CARE_PROVIDER_SITE_OTHER): Payer: BC Managed Care – PPO | Admitting: Internal Medicine

## 2019-09-06 VITALS — BP 122/78 | HR 85 | Temp 97.7°F | Ht 68.0 in | Wt 281.6 lb

## 2019-09-06 DIAGNOSIS — E1165 Type 2 diabetes mellitus with hyperglycemia: Secondary | ICD-10-CM | POA: Diagnosis not present

## 2019-09-06 MED ORDER — RYBELSUS 3 MG PO TABS: 3.0000 mg | ORAL_TABLET | Freq: Every day | ORAL | 6 refills | Status: DC

## 2019-09-06 NOTE — Patient Instructions (Signed)
-   Toujeo 34  units daily  - Novolog 12 units with each meal  - Novolog 4 units with Morning Coffee - Metformin 1000 mg Twice a day  - Farxiga 10 mg daily  - Start Rybelsus 3 mg daily with Breakfast    - Contact us if your sugar are below 70 mg/dL     HOW TO TREAT LOW BLOOD SUGARS (Blood sugar LESS THAN 70 MG/DL)  Please follow the RULE OF 15 for the treatment of hypoglycemia treatment (when your (blood sugars are less than 70 mg/dL)    STEP 1: Take 15 grams of carbohydrates when your blood sugar is low, which includes:   3-4 GLUCOSE TABS  OR  3-4 OZ OF JUICE OR REGULAR SODA OR  ONE TUBE OF GLUCOSE GEL     STEP 2: RECHECK blood sugar in 15 MINUTES STEP 3: If your blood sugar is still low at the 15 minute recheck --> then, go back to STEP 1 and treat AGAIN with another 15 grams of carbohydrates.

## 2019-09-06 NOTE — Progress Notes (Signed)
Name: Tanya Graves  Age/ Sex: 54 y.o., female   MRN/ DOB: [id number], 1965/12/21     PCP: [name], MD   Reason for Endocrinology Evaluation: Type 2 Diabetes Mellitus  Initial Endocrine Consultative Visit: 07/08/2018    PATIENT IDENTIFIER: [name] is a 54 y.o. female with a past medical history of T2DM, HTN, Dyslipidemia and hypothyroidism . The patient has followed with Endocrinology clinic since 07/08/2018 for consultative assistance with management of her diabetes.  DIABETIC HISTORY:  Tanya Graves was diagnosed with T2DM ~ 2010.  She does not recall intolerance to other oral glycemic agents.  On her initial visit here she was on MDI regimen of insulin and metformin. Her hemoglobin A1c has ranged from 11.4%  in 2016, peaking at 12.5% in 2019   Thyroid Disease  She is S/P RAI ablation years ago > 20 yrs ago and has been on levothyroxine . She is on Levothyroxine 175 mcg daily   SUBJECTIVE:   During the last visit (06/08/2019): A1c 9.5% . We continued Metformin, Farxiga, and adjusted MDI regimen      Today (09/06/2019): [name] for 3 month follow-up on diabetes management.  She checks her blood sugars 4 times daily, preprandial and bedtime. The patient has not had hypoglycemic episodes since the last clinic visit. Otherwise, the patient has not required any recent emergency interventions for hypoglycemia and has not had recent hospitalizations secondary to hyper or hypoglycemic episodes.    The patient admits to experimenting with dietary changes, she has been using regular soda to mix with alcoholic beverages because the diet sodas were increasing her risk of hypoglycemia.  HOME DIABETES REGIMEN:   Metformin 1000 mg twice daily with meals- forgets at night sometimes   Toujeo 34 units daily   Novolog  10 units TIDQAC  Farxiga 10 mg daily     CONTINUOUS GLUCOSE MONITORING RECORD INTERPRETATION    Dates of Recording: 3/30-4/04/2020  Sensor  description: Dexcom  Results statistics:   CGM use % of time 93  Average and SD 252/9.3  Time in range    23   %  % Time Above 180 25  % Time above 250 50  % Time Below target <1    Glycemic patterns summary: Hyperglycemia during the day and night   Hyperglycemic episodes all day and night    Hypoglycemic episodes occurred sporadically without a pattern      HISTORY:  Past Medical History:  Past Medical History:  Diagnosis Date  . Arthritis   . Diabetes mellitus without complication ([place])    type 2  . Thyroid disease    Past Surgical History:  Past Surgical History:  Procedure Laterality Date  . APPENDECTOMY  1993  . CALCANEAL OSTEOTOMY Left 07/17/2016   Procedure: CALCANEAL OSTEOTOMY;  Surgeon: [name], MD;  Location: [facility] ORS;  Service: Orthopedics;  Laterality: Left;  . COLONOSCOPY  12/18/2018   found 1 58mm polyp, going back in 5 years   . COLONOSCOPY WITH PROPOFOL N/A 12/18/2018   Procedure: COLONOSCOPY WITH PROPOFOL;  Surgeon: [name], MD;  Location: [facility] ENDOSCOPY;  Service: Gastroenterology;  Laterality: N/A;  . IRRIGATION AND DEBRIDEMENT ABSCESS N/A 01/24/2015   Procedure: IRRIGATION AND DEBRIDEMENT ABSCESS/GROIN ABSCESS;  Surgeon: [name], MD;  Location: [facility] ORS;  Service: General;  Laterality: N/A;  . KNEE ARTHROSCOPY W/ MENISCAL REPAIR Right 2008  . ROTATOR CUFF REPAIR Right 2015  . TONSILLECTOMY  1998    Social History:  reports that she quit smoking about 12 months ago. Her smoking use included cigarettes. She has a 7.50 pack-year smoking history. She has never used smokeless tobacco. She reports current alcohol use. She reports that she does not use drugs. Family History:  Family History  Problem Relation Age of Onset  . Diabetes Father   . Heart failure Father   . Diabetes Brother   . Arthritis Mother   . Diabetes Mother   . Transient ischemic attack Mother   . Heart failure Maternal Grandmother   . Alcohol abuse Maternal  Grandmother   . Diabetes Paternal Grandmother   . Kidney failure Paternal Grandmother   . Heart failure Paternal Grandfather   . Cancer Paternal Grandfather        unknown type  . Hypertension Brother   . Breast cancer Maternal Great-grandmother      HOME MEDICATIONS:    OBJECTIVE:   Vital Signs: BP 122/78 (BP Location: Left Arm, Patient Position: Sitting, Cuff Size: Large)   Pulse 85   Temp 97.7 F (36.5 C)   Ht 5\' 8"  (1.727 m)   Wt 281 lb 9.6 oz (127.7 kg)   LMP 06/28/2015 (Approximate)   SpO2 98%   BMI 42.82 kg/m   Wt Readings from Last 3 Encounters:  09/06/19 281 lb 9.6 oz (127.7 kg)  06/08/19 280 lb 6.4 oz (127.2 kg)  03/25/19 271 lb 12 oz (123.3 kg)     Exam: General: Pt appears well and is in NAD  Lungs: Clear with good BS bilat with no rales, rhonchi, or wheezes  Heart: RRR with normal S1 and S2 and no gallops; no murmurs; no rub  Extremities: No pretibial edema.   Skin: Normal texture and temperature to palpation.   Neuro: MS is good with appropriate affect, pt is alert and Ox3   DM foot exam: 06/08/2019 The skin of the feet is intact without sores or ulcerations. The pedal pulses are 2+ on right and 2+ on left. The sensation is intact to a screening 5.07, 10 gram monofilament bilaterally     DATA REVIEWED:  Lab Results  Component Value Date   HGBA1C 9.5 (A) 06/08/2019   HGBA1C 11.2 (H) 03/25/2019   HGBA1C 9.9 (A) 08/28/2018   Lab Results  Component Value Date   MICROALBUR <0.7 03/25/2019   LDLCALC 139 03/17/2018   CREATININE 0.99 03/25/2019   Lab Results  Component Value Date   MICRALBCREAT 1.5 03/25/2019     Lab Results  Component Value Date   CHOL 157 03/25/2019   HDL 46.90 03/25/2019   LDLCALC 139 03/17/2018   LDLDIRECT 88.0 03/25/2019   TRIG 202.0 (H) 03/25/2019   CHOLHDL 3 03/25/2019         ASSESSMENT / PLAN / RECOMMENDATIONS:  1)Type 2 Diabetes Mellitus, Poorly controlled, With Neuropathic complications - Most recent  A1c of 11.7 %. Goal A1c < 7.0 %.   - Poorly controlled diabetes due to dietary indiscretions.  We again discussed the importance of lifestyle changes to reach optimal glycemic control. -We discussed GLP-1 agonist as an add-on therapy, I explained to the patient that the more dietary indiscretions continue the worse her glycemic control is going to be and we have no other option but to continue to increase her medications or add to them. -We discussed the GI side effects with GLP-1 agonist.  She is going to check with her insurance to see about coverage, she was provided with a co-pay coupon today. -We did discuss that  alcohol consumption in the setting of insulin use causes unpredictable BG readings.   MEDICATIONS:  Continue metformin 1000 mg twice daily   Continue Farxiga 10 mg daily   Continue Toujeo 34 units daily  Increase NovoLog to 12 units with each meal  Start Rybelsus 3 mg daily with breakfast   EDUCATION / INSTRUCTIONS:  BG monitoring instructions: Patient is instructed to check her blood sugars 4 times a day, before meals and bedtime.  Call [place] [facility] clinic if: BG persistently < 70 or > 300. . I reviewed the Rule of 15 for the treatment of hypoglycemia in detail with the patient. Literature supplied.   F/U in 3 months   I spent  30 minutes preparing to see the patient by review of recent labs,obtaining and reviewing separately obtained history, communicating with the patient, ordering medications, tests or procedures, and documenting clinical information in the EHR including the differential Dx, treatment, and any further evaluation and other management    Signed electronically by: Mack Guise, MD  [facility] End[facility]inology  [facility] [address] [place], [place] [address] Phone: [phone] FAX: [phone]   CC: [name], [place] [address] Phone: [phone]  Fax:  [phone]  Return to Endocrinology clinic as below: Future Appointments  Date Time Provider [place]  09/27/2019  8:00 AM [name], MD [facility]

## 2019-09-07 LAB — POCT GLYCOSYLATED HEMOGLOBIN (HGB A1C): Hemoglobin A1C: 11.7 % — AB (ref 4.0–5.6)

## 2019-09-08 ENCOUNTER — Other Ambulatory Visit: Payer: Self-pay | Admitting: Internal Medicine

## 2019-09-08 ENCOUNTER — Other Ambulatory Visit: Payer: Self-pay

## 2019-09-08 ENCOUNTER — Telehealth: Payer: Self-pay | Admitting: Internal Medicine

## 2019-09-08 DIAGNOSIS — E1165 Type 2 diabetes mellitus with hyperglycemia: Secondary | ICD-10-CM

## 2019-09-08 MED ORDER — DEXCOM G6 TRANSMITTER MISC: 1.0000 | 3 refills | Status: DC

## 2019-09-08 NOTE — Telephone Encounter (Signed)
Have you contacted your pharmacy to initiate the refill request? Yes, was told by pharmacy to call as well     What is the name of the medication/medications? DEXCOM 6 - Transmitters  Is this for a 90 day? NO  What is the name and location of the pharmacy you would like to use?  [facility]  Patient is leaving town tomorrow -

## 2019-09-24 ENCOUNTER — Other Ambulatory Visit: Payer: Self-pay | Admitting: Family Medicine

## 2019-09-24 DIAGNOSIS — E039 Hypothyroidism, unspecified: Secondary | ICD-10-CM

## 2019-09-27 ENCOUNTER — Ambulatory Visit: Payer: BC Managed Care – PPO | Admitting: Family Medicine

## 2019-09-27 ENCOUNTER — Telehealth: Payer: Self-pay

## 2019-09-27 DIAGNOSIS — Z0289 Encounter for other administrative examinations: Secondary | ICD-10-CM

## 2019-09-27 NOTE — Telephone Encounter (Signed)
[place] Night - Client Nonclinical Telephone Record [facility] Client [place] Night - Client Client Site [place] - Night Contact Type Call Who Is Calling Patient / Member / Family / Caregiver Caller Name Combs Phone Number [phone] Call Type Message Only Information Provided Reason for Call Returning a Call from the Office Initial [place] states she missed a call from the office Additional Comment Disp. Time Disposition Final User 09/26/2019 12:32:43 PM General Information Provided Yes [place], Anna Call Closed By: Caryl Bis Transaction Date/Time: 09/26/2019 12:30:36 PM (ET)

## 2019-12-03 ENCOUNTER — Other Ambulatory Visit: Payer: Self-pay | Admitting: Internal Medicine

## 2019-12-17 ENCOUNTER — Ambulatory Visit (INDEPENDENT_AMBULATORY_CARE_PROVIDER_SITE_OTHER)
Admission: RE | Admit: 2019-12-17 | Discharge: 2019-12-17 | Disposition: A | Discharge status: Home / Self Care | Payer: BC Managed Care – PPO | Source: Ambulatory Visit | Attending: Family Medicine | Admitting: Family Medicine

## 2019-12-17 ENCOUNTER — Other Ambulatory Visit: Payer: Self-pay | Admitting: Family Medicine

## 2019-12-17 ENCOUNTER — Encounter: Payer: Self-pay | Admitting: Family Medicine

## 2019-12-17 ENCOUNTER — Ambulatory Visit (INDEPENDENT_AMBULATORY_CARE_PROVIDER_SITE_OTHER): Payer: BC Managed Care – PPO | Admitting: Family Medicine

## 2019-12-17 ENCOUNTER — Other Ambulatory Visit: Payer: Self-pay

## 2019-12-17 VITALS — BP 120/90 | HR 75 | Temp 96.7°F | Ht 68.0 in | Wt 286.5 lb

## 2019-12-17 DIAGNOSIS — R0789 Other chest pain: Secondary | ICD-10-CM | POA: Insufficient documentation

## 2019-12-17 MED ORDER — TRAMADOL HCL 50 MG PO TABS: 50.0000 mg | ORAL_TABLET | Freq: Three times a day (TID) | ORAL | 0 refills | Status: AC | PRN

## 2019-12-17 NOTE — Progress Notes (Signed)
Chief Complaint  Patient presents with   Left Rib Pain    Was squatting and heard pop on Saturday    History of Present Illness: HPI    54 year old female  obese patient with DM of Dr. Verda Cumins  Presents with new onset pain in left chest wall.  Started after squatting, leaning over and felt sharp pain, felt pop in left ribcage laterally.. 6 days ago.  It is keeping her up at night. Hard to take deep breath  Pain radiating to mid back.  Feeling spasms in mid back.  Pain 8/10, constant, 10/10 at night.   No heat, no bruising. No rash.   She has been using heat, she is using ibuprofen 2 tabs every 4 hours.   No history of back issues/ surgery.  This visit occurred during the SARS-CoV-2 public health emergency.  Safety protocols were in place, including screening questions prior to the visit, additional usage of staff PPE, and extensive cleaning of exam room while observing appropriate contact time as indicated for disinfecting solutions.   COVID 19 screen:  No recent travel or known exposure to COVID19 The patient denies respiratory symptoms of COVID 19 at this time. The importance of social distancing was discussed today.     Review of Systems  Constitutional: Negative for chills and fever.  HENT: Negative for congestion and ear pain.   Eyes: Negative for pain and redness.  Respiratory: Negative for cough and shortness of breath.   Cardiovascular: Negative for chest pain, palpitations and leg swelling.  Gastrointestinal: Positive for constipation. Negative for abdominal pain, blood in stool, diarrhea, nausea and vomiting.       Slightly more constipation since diet changes...drinks lot of water ( 2 gallons a day!) Encouraged her to drink 64 ox daily and  Increase fiber, can use miralax prn.  Genitourinary: Negative for dysuria.  Musculoskeletal: Negative for falls and myalgias.  Skin: Negative for rash.  Neurological: Negative for dizziness.  Psychiatric/Behavioral:  Negative for depression. The patient is not nervous/anxious.       Past Medical History:  Diagnosis Date   Arthritis    Diabetes mellitus without complication ([place])    type 2   Thyroid disease     reports that she quit smoking about 16 months ago. Her smoking use included cigarettes. She has a 7.50 pack-year smoking history. She has never used smokeless tobacco. She reports current alcohol use. She reports that she does not use drugs.     Observations/Objective: Blood pressure (!) 120/90, pulse 75, temperature (!) 96.7 F (35.9 C), temperature source Temporal, height 5\' 8"  (1.727 m), weight (!) 286 lb 8 oz (130 kg), last menstrual period 06/28/2015, SpO2 97 %.  Physical Exam Constitutional:      General: She is not in acute distress.    Appearance: Normal appearance. She is well-developed. She is not ill-appearing or toxic-appearing.  HENT:     Head: Normocephalic.     Right Ear: Hearing, tympanic membrane, ear canal and external ear normal. Tympanic membrane is not erythematous, retracted or bulging.     Left Ear: Hearing, tympanic membrane, ear canal and external ear normal. Tympanic membrane is not erythematous, retracted or bulging.     Nose: No mucosal edema or rhinorrhea.     Right Sinus: No maxillary sinus tenderness or frontal sinus tenderness.     Left Sinus: No maxillary sinus tenderness or frontal sinus tenderness.     Mouth/Throat:     Pharynx: Uvula midline.  Eyes:     General: Lids are normal. Lids are everted, no foreign bodies appreciated.     Conjunctiva/sclera: Conjunctivae normal.     Pupils: Pupils are equal, round, and reactive to light.  Neck:     Thyroid: No thyroid mass or thyromegaly.     Vascular: No carotid bruit.     Trachea: Trachea normal.  Cardiovascular:     Rate and Rhythm: Normal rate and regular rhythm.     Pulses: Normal pulses.     Heart sounds: Normal heart sounds, S1 normal and S2 normal. No murmur heard.  No friction rub. No gallop.    Pulmonary:     Effort: Pulmonary effort is normal. No tachypnea or respiratory distress.     Breath sounds: Normal breath sounds. No decreased breath sounds, wheezing, rhonchi or rales.  Chest:     Chest wall: Tenderness present. No mass, lacerations or swelling. There is no dullness to percussion.    Abdominal:     General: Bowel sounds are normal.     Palpations: Abdomen is soft.     Tenderness: There is no abdominal tenderness.  Musculoskeletal:     Cervical back: Normal range of motion and neck supple.  Skin:    General: Skin is warm and dry.     Findings: No rash.  Neurological:     Mental Status: She is alert.  Psychiatric:        Mood and Affect: Mood is not anxious or depressed.        Speech: Speech normal.        Behavior: Behavior normal. Behavior is cooperative.        Thought Content: Thought content normal.        Judgment: Judgment normal.      Assessment and Plan Left-sided chest wall pain Likely  rib fracture/ rib injury. Eval with X-ray.  Treat with ice, NSAIDs and tramadol for breakthrough pain.       Eliezer Lofts, MD

## 2019-12-17 NOTE — Patient Instructions (Addendum)
Change ibuprofen to 800 mg every eight hours.  Take deep breaths.  Can use tramadol for breakthrough pain.  No lifting > 10 lbs. Expect several months of recovery. Drink 64 ox daily and  Increase fiber, can use miralax prn for any constipation as straining will make rib pain increase.    Rib Fracture  A rib fracture is a break or crack in one of the bones of the ribs. The ribs are like a cage that goes around your upper chest. A broken or cracked rib is often painful, but most do not cause other problems. Most rib fractures usually heal on their own in 1-3 months. Follow these instructions at home: Managing pain, stiffness, and swelling  If directed, apply ice to the injured area. ? Put ice in a plastic bag. ? Place a towel between your skin and the bag. ? Leave the ice on for 20 minutes, 2-3 times a day.  Take over-the-counter and prescription medicines only as told by your doctor. Activity  Avoid activities that cause pain to the injured area. Protect your injured area.  Slowly increase activity as told by your doctor. General instructions  Do deep breathing as told by your doctor. You may be told to: ? Take deep breaths many times a day. ? Cough many times a day while hugging a pillow. ? Use a device (incentive spirometer) to do deep breathing many times a day.  Drink enough fluid to keep your pee (urine) clear or pale yellow.  Do not wear a rib belt or binder. These do not allow you to breathe deeply.  Keep all follow-up visits as told by your doctor. This is important. Contact a doctor if:  You have a fever. Get help right away if:  You have trouble breathing.  You are short of breath.  You cannot stop coughing.  You cough up thick or bloody spit (sputum).  You feel sick to your stomach (nauseous), throw up (vomit), or have belly (abdominal) pain.  Your pain gets worse and medicine does not help. Summary  A rib fracture is a break or crack in one of the  bones of the ribs.  Apply ice to the injured area and take medicines for pain as told by your doctor.  Take deep breaths and cough many times a day. Hug a pillow every time you cough. This information is not intended to replace advice given to you by your health care provider. Make sure you discuss any questions you have with your health care provider. Document Revised: 04/25/2017 Document Reviewed: 08/13/2016 Elsevier Patient Education  2020 [employer].

## 2019-12-17 NOTE — Assessment & Plan Note (Signed)
Likely  rib fracture/ rib injury. Eval with X-ray.  Treat with ice, NSAIDs and tramadol for breakthrough pain.

## 2019-12-31 ENCOUNTER — Ambulatory Visit: Payer: BC Managed Care – PPO | Admitting: Internal Medicine

## 2020-01-10 LAB — HM DIABETES EYE EXAM

## 2020-01-12 ENCOUNTER — Encounter: Payer: Self-pay | Admitting: Family Medicine

## 2020-02-28 ENCOUNTER — Other Ambulatory Visit: Payer: Self-pay

## 2020-02-28 ENCOUNTER — Ambulatory Visit (INDEPENDENT_AMBULATORY_CARE_PROVIDER_SITE_OTHER): Payer: BC Managed Care – PPO | Admitting: Internal Medicine

## 2020-02-28 VITALS — BP 122/70 | HR 107 | Ht 68.0 in | Wt 282.6 lb

## 2020-02-28 DIAGNOSIS — Z794 Long term (current) use of insulin: Secondary | ICD-10-CM | POA: Diagnosis not present

## 2020-02-28 DIAGNOSIS — E039 Hypothyroidism, unspecified: Secondary | ICD-10-CM | POA: Diagnosis not present

## 2020-02-28 DIAGNOSIS — E890 Postprocedural hypothyroidism: Secondary | ICD-10-CM

## 2020-02-28 DIAGNOSIS — E1142 Type 2 diabetes mellitus with diabetic polyneuropathy: Secondary | ICD-10-CM | POA: Diagnosis not present

## 2020-02-28 LAB — POCT GLYCOSYLATED HEMOGLOBIN (HGB A1C): Hemoglobin A1C: 11 % — AB (ref 4.0–5.6)

## 2020-02-28 LAB — TSH: TSH: 1.41 u[IU]/mL (ref 0.35–4.50)

## 2020-02-28 NOTE — Progress Notes (Signed)
Name: Tanya Graves  Age/ Sex: 54 y.o., female   MRN/ DOB: [id number], 12-30-1965     PCP: [name], MD   Reason for Endocrinology Evaluation: Type 2 Diabetes Mellitus  Initial Endocrine Consultative Visit: 07/08/2018    PATIENT IDENTIFIER: [name] is a 54 y.o. female with a past medical history of T2DM, HTN, Dyslipidemia and hypothyroidism . The patient has followed with Endocrinology clinic since 07/08/2018 for consultative assistance with management of her diabetes.  DIABETIC HISTORY:  Ms. Younkin was diagnosed with T2DM ~ 2010.  She does not recall intolerance to other oral glycemic agents.  On her initial visit here she was on MDI regimen of insulin and metformin. Her hemoglobin A1c has ranged from 11.4%  in 2016, peaking at 12.5% in 2019  Rybelsus was started in 10/2019   Thyroid Disease  She is S/P RAI ablation years ago > 20 yrs ago and has been on levothyroxine . She is on Levothyroxine 175 mcg daily   SUBJECTIVE:   During the last visit (09/06/2019): A1c 11.1% . We continued Metformin, Farxiga, and adjusted MDI regime and started Rybelsus     Today (02/28/2020): [name] for a follow-up on diabetes management.  She checks her blood sugars 4 times daily, preprandial and bedtime. The patient has not had hypoglycemic episodes since the last clinic visit.     HOME DIABETES REGIMEN:   Metformin 1000 mg twice daily with meals  Toujeo 34 units daily- taking 27 units    Novolog  12 units TIDQAC  Farxiga 10 mg daily- She is not sure   Rybelsus 3 mg daily- cost prohibitive     CONTINUOUS GLUCOSE MONITORING RECORD INTERPRETATION    Dates of Recording: 9/21-10/08/2019  Sensor description: Dexcom  Results statistics:   CGM use % of time 86  Average and SD 233/110  Time in range    37  %  % Time Above 180 15  % Time above 250 45  % Time Below target <1    Glycemic patterns summary: Hyperglycemia during the day , optimal at night with  occasional hypoglycemia  Hyperglycemic episodes all day  (postprandial)  Hypoglycemic episodes occurred sporadically without a pattern overnight     HISTORY:  Past Medical History:  Past Medical History:  Diagnosis Date   Arthritis    Diabetes mellitus without complication ([place])    type 2   Thyroid disease    Past Surgical History:  Past Surgical History:  Procedure Laterality Date   APPENDECTOMY  1993   CALCANEAL OSTEOTOMY Left 07/17/2016   Procedure: CALCANEAL OSTEOTOMY;  Surgeon: [name], MD;  Location: [facility] ORS;  Service: Orthopedics;  Laterality: Left;   COLONOSCOPY  12/18/2018   found 1 64mm polyp, going back in 5 years    COLONOSCOPY WITH PROPOFOL N/A 12/18/2018   Procedure: COLONOSCOPY WITH PROPOFOL;  Surgeon: [name], MD;  Location: [facility] ENDOSCOPY;  Service: Gastroenterology;  Laterality: N/A;   IRRIGATION AND DEBRIDEMENT ABSCESS N/A 01/24/2015   Procedure: IRRIGATION AND DEBRIDEMENT ABSCESS/GROIN ABSCESS;  Surgeon: [name], MD;  Location: [facility] ORS;  Service: General;  Laterality: N/A;   KNEE ARTHROSCOPY W/ MENISCAL REPAIR Right 2008   ROTATOR CUFF REPAIR Right 2015   TONSILLECTOMY  1998    Social History:  reports that she quit smoking about 18 months ago. Her smoking use included cigarettes. She has a 7.50 pack-year smoking history. She has never used smokeless tobacco. She reports current alcohol use. She reports  that she does not use drugs. Family History:  Family History  Problem Relation Age of Onset   Diabetes Father    Heart failure Father    Diabetes Brother    Arthritis Mother    Diabetes Mother    Transient ischemic attack Mother    Heart failure Maternal Grandmother    Alcohol abuse Maternal Grandmother    Diabetes Paternal Grandmother    Kidney failure Paternal Grandmother    Heart failure Paternal Grandfather    Cancer Paternal Grandfather        unknown type   Hypertension Brother    Breast cancer  Maternal Great-grandmother      HOME MEDICATIONS:    OBJECTIVE:   Vital Signs: BP 122/70 (BP Location: Left Arm, Patient Position: Sitting, Cuff Size: Large)    Pulse (!) 107    Ht 5\' 8"  (1.727 m)    Wt 282 lb 9.6 oz (128.2 kg)    LMP 06/28/2015 (Approximate)    SpO2 98%    BMI 42.97 kg/m   Wt Readings from Last 3 Encounters:  02/28/20 282 lb 9.6 oz (128.2 kg)  12/17/19 (!) 286 lb 8 oz (130 kg)  09/06/19 281 lb 9.6 oz (127.7 kg)     Exam: General: Pt appears well and is in NAD  Lungs: Clear with good BS bilat with no rales, rhonchi, or wheezes  Heart: RRR with normal S1 and S2 and no gallops; no murmurs; no rub  Extremities: No pretibial edema.   Skin: Normal texture and temperature to palpation.   Neuro: MS is good with appropriate affect, pt is alert and Ox3   DM foot exam: 02/28/2020 The skin of the feet is intact without sores or ulcerations. The pedal pulses are 2+ on right and 2+ on left. The sensation is intact to a screening 5.07, 10 gram monofilament bilaterally     DATA REVIEWED:  Lab Results  Component Value Date   HGBA1C 11.7 (A) 09/07/2019   HGBA1C 9.5 (A) 06/08/2019   HGBA1C 11.2 (H) 03/25/2019   Lab Results  Component Value Date   MICROALBUR <0.7 03/25/2019   LDLCALC 139 03/17/2018   CREATININE 0.99 03/25/2019   Lab Results  Component Value Date   MICRALBCREAT 1.5 03/25/2019     Lab Results  Component Value Date   CHOL 157 03/25/2019   HDL 46.90 03/25/2019   LDLCALC 139 03/17/2018   LDLDIRECT 88.0 03/25/2019   TRIG 202.0 (H) 03/25/2019   CHOLHDL 3 03/25/2019         ASSESSMENT / PLAN / RECOMMENDATIONS:  1)Type 2 Diabetes Mellitus, Poorly controlled, With Neuropathic complications - Most recent A1c of 11.0 %. Goal A1c < 7.0 %.   -Patient continues with hyperglycemia , unfortunately SGLT2 inhibitors and GLP-1 agonist have been cost prohibitive. -She is tolerating Metformin without side effects -On her last visit which was  approximately 6 months ago she told me she was taking 34 units of Toujeo, which we have continued.  Today when she was asked how much Toujeo she is taking, patient indicated she has it written at home.  When I questioned her inability to remember the dose that she has been taking for the past 6 months, patient stated that she thinks she has been taking 27 units daily  -I am concerned about medication nonadherence -In review of the Dexcom download, she has been noted with fasting hypoglycemia, if the patient has been actually taking 27 units of Toujeo this will need to be  reduced.  MEDICATIONS:  Continue metformin 1000 mg twice daily   Decrease Toujeo 24 units daily (assuming she has been taking 27 units daily)  Increase NovoLog to 14 units with each meal    EDUCATION / INSTRUCTIONS:  BG monitoring instructions: Patient is instructed to check her blood sugars 4 times a day, before meals and bedtime.  Call [place] [facility] clinic if: BG persistently < 70   I reviewed the Rule of 15 for the treatment of hypoglycemia in detail with the patient. Literature supplied.  2) post ablative hypothyroidism:  -Repeat TSH is normal -No local neck symptoms  Medication Continue levothyroxine 175 MCG daily F/U in 4 months     Signed electronically by: Mack Guise, MD  [facility] Endocrinology  [facility] [address], [place] [address] Phone: [phone] FAX: [phone]   CC: [name], [place] [address] Phone: [phone]  Fax: [phone]  Return to Endocrinology clinic as below: No future appointments.

## 2020-02-28 NOTE — Patient Instructions (Addendum)
-   Decrease Toujeo to 24  units daily  - Novolog 14 units with each meal  - Metformin 1000 mg Twice a day      HOW TO TREAT LOW BLOOD SUGARS (Blood sugar LESS THAN 70 MG/DL)  Please follow the RULE OF 15 for the treatment of hypoglycemia treatment (when your (blood sugars are less than 70 mg/dL)    STEP 1: Take 15 grams of carbohydrates when your blood sugar is low, which includes:   3-4 GLUCOSE TABS  OR  3-4 OZ OF JUICE OR REGULAR SODA OR  ONE TUBE OF GLUCOSE GEL     STEP 2: RECHECK blood sugar in 15 MINUTES STEP 3: If your blood sugar is still low at the 15 minute recheck --> then, go back to STEP 1 and treat AGAIN with another 15 grams of carbohydrates.

## 2020-02-29 MED ORDER — METFORMIN HCL 1000 MG PO TABS: 1000.0000 mg | ORAL_TABLET | Freq: Two times a day (BID) | ORAL | 3 refills | Status: DC

## 2020-02-29 MED ORDER — LEVOTHYROXINE SODIUM 175 MCG PO TABS: 175.0000 ug | ORAL_TABLET | Freq: Every day | ORAL | 3 refills | Status: DC

## 2020-03-15 ENCOUNTER — Telehealth: Payer: Self-pay | Admitting: Obstetrics and Gynecology

## 2020-03-15 ENCOUNTER — Other Ambulatory Visit: Payer: Self-pay | Admitting: Obstetrics and Gynecology

## 2020-03-15 DIAGNOSIS — Z1239 Encounter for other screening for malignant neoplasm of breast: Secondary | ICD-10-CM

## 2020-03-15 NOTE — Telephone Encounter (Signed)
Order is in   [facility] [address] [place] [address]  [facility] [place]  [address] [place]  [address]  Phone: [phone]  

## 2020-03-15 NOTE — Telephone Encounter (Signed)
Patient is aware the order is in, and wanted to have it done in [place]. Patient was given phone#s for [facility] and [facility].

## 2020-03-15 NOTE — Telephone Encounter (Signed)
Patient called to schedule annual and mammogram. Annual scheduled for next available on 04/10/20. Patient mentioned she has not had a mammogram since we did them at [facility], and that she is concerned about cost and has met her deductible this year. Patient asks if she can go ahead w/ mammogram.

## 2020-03-19 ENCOUNTER — Encounter: Payer: Self-pay | Admitting: Internal Medicine

## 2020-03-20 NOTE — Telephone Encounter (Signed)
Printed and place readings in Dr [employer] inbox

## 2020-03-21 ENCOUNTER — Other Ambulatory Visit: Payer: Self-pay | Admitting: Internal Medicine

## 2020-03-21 MED ORDER — NOVOLOG FLEXPEN 100 UNIT/ML ~~LOC~~ SOPN: 18.0000 [IU] | PEN_INJECTOR | Freq: Three times a day (TID) | SUBCUTANEOUS | 6 refills | Status: DC

## 2020-03-31 ENCOUNTER — Ambulatory Visit (INDEPENDENT_AMBULATORY_CARE_PROVIDER_SITE_OTHER): Payer: BC Managed Care – PPO

## 2020-03-31 ENCOUNTER — Other Ambulatory Visit: Payer: Self-pay

## 2020-03-31 ENCOUNTER — Telehealth: Payer: Self-pay

## 2020-03-31 ENCOUNTER — Other Ambulatory Visit: Payer: Self-pay | Admitting: Obstetrics and Gynecology

## 2020-03-31 DIAGNOSIS — R310 Gross hematuria: Secondary | ICD-10-CM | POA: Diagnosis not present

## 2020-03-31 DIAGNOSIS — N3001 Acute cystitis with hematuria: Secondary | ICD-10-CM

## 2020-03-31 LAB — POCT URINALYSIS DIPSTICK
Glucose, UA: POSITIVE — AB
Nitrite, UA: POSITIVE
Protein, UA: POSITIVE — AB
Spec Grav, UA: >=1.03 — AB (ref 1.010–1.025)
Urobilinogen, UA: NEGATIVE E.U./dL — AB
pH, UA: 5 (ref 5.0–8.0)

## 2020-03-31 MED ORDER — NITROFURANTOIN MONOHYD MACRO 100 MG PO CAPS: 100.0000 mg | ORAL_CAPSULE | Freq: Two times a day (BID) | ORAL | 0 refills | Status: AC

## 2020-03-31 NOTE — Telephone Encounter (Signed)
Patient is scheduled for 11 am for UA drop

## 2020-03-31 NOTE — Addendum Note (Signed)
Addended by: Cleophas Dunker D on: 03/31/2020 11:33 AM   Modules accepted: Orders

## 2020-03-31 NOTE — Telephone Encounter (Signed)
Per [facility] please sched for urine drop off.  Thx.

## 2020-03-31 NOTE — Telephone Encounter (Signed)
Pt calling; can she have a rx for UTI - has blood in her urine.  [phone]

## 2020-03-31 NOTE — Telephone Encounter (Signed)
Patient was seen for UA drop at 11 am on 03/31/20

## 2020-03-31 NOTE — Progress Notes (Signed)
Pt came to drop off ccu d/t blood in urine.  Please see u/a results.  Will send for culture.

## 2020-03-31 NOTE — Progress Notes (Signed)
Pt aware rx sent in.  Pt really appreciative of [place] helping her today.

## 2020-04-03 LAB — URINE CULTURE

## 2020-04-04 NOTE — Telephone Encounter (Signed)
09/27/19 pt was no show but pt has been seen since that appt.

## 2020-04-06 ENCOUNTER — Telehealth: Payer: Self-pay

## 2020-04-06 NOTE — Telephone Encounter (Signed)
Patient left a form in the front that requires a doctor signature. Form is in relation to her foster parent application.  Charge sheet is attached and form left on cart.

## 2020-04-10 ENCOUNTER — Ambulatory Visit: Payer: BC Managed Care – PPO | Admitting: Obstetrics and Gynecology

## 2020-04-11 NOTE — Telephone Encounter (Signed)
Alice scheduled appt for pt to have physical and have paperwork filled out. Paperwork now in [employer] on my desk.

## 2020-04-28 ENCOUNTER — Other Ambulatory Visit: Payer: Self-pay

## 2020-04-28 ENCOUNTER — Ambulatory Visit
Admission: RE | Admit: 2020-04-28 | Discharge: 2020-04-28 | Disposition: A | Discharge status: Home / Self Care | Payer: BC Managed Care – PPO | Source: Ambulatory Visit | Attending: Obstetrics and Gynecology | Admitting: Obstetrics and Gynecology

## 2020-04-28 DIAGNOSIS — Z1239 Encounter for other screening for malignant neoplasm of breast: Secondary | ICD-10-CM

## 2020-05-02 ENCOUNTER — Other Ambulatory Visit: Payer: Self-pay

## 2020-05-02 ENCOUNTER — Encounter: Payer: BC Managed Care – PPO | Admitting: Family Medicine

## 2020-05-02 ENCOUNTER — Other Ambulatory Visit: Payer: Self-pay | Admitting: Family Medicine

## 2020-05-02 ENCOUNTER — Ambulatory Visit (INDEPENDENT_AMBULATORY_CARE_PROVIDER_SITE_OTHER): Payer: BC Managed Care – PPO | Admitting: Family Medicine

## 2020-05-02 VITALS — BP 120/80 | HR 82 | Temp 96.8°F | Ht 68.0 in | Wt 279.5 lb

## 2020-05-02 DIAGNOSIS — E1169 Type 2 diabetes mellitus with other specified complication: Secondary | ICD-10-CM

## 2020-05-02 DIAGNOSIS — E785 Hyperlipidemia, unspecified: Secondary | ICD-10-CM | POA: Diagnosis not present

## 2020-05-02 DIAGNOSIS — Z1159 Encounter for screening for other viral diseases: Secondary | ICD-10-CM | POA: Diagnosis not present

## 2020-05-02 DIAGNOSIS — E1165 Type 2 diabetes mellitus with hyperglycemia: Secondary | ICD-10-CM

## 2020-05-02 DIAGNOSIS — Z0000 Encounter for general adult medical examination without abnormal findings: Secondary | ICD-10-CM | POA: Diagnosis not present

## 2020-05-02 LAB — COMPREHENSIVE METABOLIC PANEL
ALT: 10 U/L (ref 0–35)
AST: 13 U/L (ref 0–37)
Albumin: 4.2 g/dL (ref 3.5–5.2)
Alkaline Phosphatase: 73 U/L (ref 39–117)
BUN: 15 mg/dL (ref 6–23)
CO2: 30 mEq/L (ref 19–32)
Calcium: 9.1 mg/dL (ref 8.4–10.5)
Chloride: 103 mEq/L (ref 96–112)
Creatinine, Ser: 0.75 mg/dL (ref 0.40–1.20)
GFR: 90.95 mL/min (ref >60.00)
Glucose, Bld: 182 mg/dL — ABNORMAL HIGH (ref 70–99)
Potassium: 4.4 mEq/L (ref 3.5–5.1)
Sodium: 139 mEq/L (ref 135–145)
Total Bilirubin: 0.5 mg/dL (ref 0.2–1.2)
Total Protein: 6.8 g/dL (ref 6.0–8.3)

## 2020-05-02 LAB — LIPID PANEL
Cholesterol: 159 mg/dL (ref 0–200)
HDL: 59.4 mg/dL (ref >39.00)
LDL Cholesterol: 82 mg/dL (ref 0–99)
NonHDL: 99.26
Total CHOL/HDL Ratio: 3
Triglycerides: 88 mg/dL (ref 0.0–149.0)
VLDL: 17.6 mg/dL (ref 0.0–40.0)

## 2020-05-02 LAB — MICROALBUMIN / CREATININE URINE RATIO
Creatinine,U: 173.2 mg/dL
Microalb Creat Ratio: 2.7 mg/g (ref 0.0–30.0)
Microalb, Ur: 4.7 mg/dL — ABNORMAL HIGH (ref 0.0–1.9)

## 2020-05-02 MED ORDER — ATORVASTATIN CALCIUM 20 MG PO TABS: 20.0000 mg | ORAL_TABLET | Freq: Every day | ORAL | 3 refills | Status: DC

## 2020-05-02 NOTE — Progress Notes (Signed)
Annual Exam   Chief Complaint:  Chief Complaint  Patient presents with  . Annual Exam    History of Present Illness:  [name] is a 54 y.o. G1P0010 who LMP was Patient's last menstrual period was 06/28/2015 (approximate)., presents today for her annual examination.     Nutrition She does get adequate calcium and Vitamin D in her diet. Diet: leafy greens, doing weight watchers Exercise: 8000 step goal  Safety The patient wears seatbelts: yes.     The patient feels safe at home and in their relationships: yes.  Dentist - needs to return Eye doctor - yes  Menstrual:  Symptoms of menopause: none  Has premarin cream but not using as not currently sexually active  GYN She is not sexually active.    Cervical Cancer Screening (21-65):   Last Pap:   July 2020 Results were: no abnormalities HPV DNA - positive  Follows with GYN and is due for recheck   Breast Cancer Screening (Age 38-74):  There is no FH of breast cancer. There is no FH of ovarian cancer. BRCA screening Not Indicated.  Last Mammogram: 04/28/2020 The patient does want a mammogram this year.    Colon Cancer Screening:  Age 46-75 yo - benefits outweigh the risk. Adults 64-85 yo who have never been screened benefit.  Benefits: 134000 people in 2016 will be diagnosed and 49,000 will die - early detection helps [place]: Complications 2/2 to colonoscopy High Risk (Colonoscopy): genetic disorder (Lynch syndrome or familial adenomatous polyposis), personal hx of IBD, previous adenomatous polyp, or previous colorectal cancer, FamHx start 10 years before the age at diagnosis, increased in males and black race  Options:  FIT - looks for hemoglobin (blood in the stool) - specific and fairly sensitive - must be done annually Cologuard - looks for DNA and blood - more sensitive - therefore can have more false positives, every 3 years Colonoscopy - every 10 years if normal - sedation, bowl prep, must have someone  drive you  Shared decision making and the patient had decided to do - colonoscopy done 11/2018 - 5 year recall.   Social History   Tobacco Use  Smoking Status Former Smoker  . Packs/day: 0.25  . Years: 30.00  . Pack years: 7.50  . Types: Cigarettes  . Quit date: 08/14/2018  . Years since quitting: 1.7  Smokeless Tobacco Never Used    Lung Cancer Screening (Ages 72-80): yes 20 year pack history? No Current Tobacco user? No Quit less than 15 years ago? Yes Interested in low dose CT for lung cancer screening? not applicable  Weight Wt Readings from Last 3 Encounters:  05/02/20 279 lb 8 oz (126.8 kg)  02/28/20 282 lb 9.6 oz (128.2 kg)  12/17/19 (!) 286 lb 8 oz (130 kg)   Patient has very high BMI  BMI Readings from Last 1 Encounters:  05/02/20 42.50 kg/m     Chronic disease screening Blood pressure monitoring:  BP Readings from Last 3 Encounters:  05/02/20 120/80  02/28/20 122/70  12/17/19 (!) 120/90    Lipid Monitoring: Indication for screening: age >80, obesity, diabetes, family hx, CV risk factors.  Lipid screening: Yes  Lab Results  Component Value Date   CHOL 157 03/25/2019   HDL 46.90 03/25/2019   LDLCALC 139 03/17/2018   LDLDIRECT 88.0 03/25/2019   TRIG 202.0 (H) 03/25/2019   CHOLHDL 3 03/25/2019     Diabetes Screening: age >35, overweight, family hx, PCOS, hx of gestational diabetes,  at risk ethnicity Diabetes Screening screening: Yes  Lab Results  Component Value Date   HGBA1C 11.0 (A) 02/28/2020     Past Medical History:  Diagnosis Date  . Arthritis   . Diabetes mellitus without complication ([place])    type 2  . Thyroid disease     Past Surgical History:  Procedure Laterality Date  . APPENDECTOMY  1993  . CALCANEAL OSTEOTOMY Left 07/17/2016   Procedure: CALCANEAL OSTEOTOMY;  Surgeon: [name], MD;  Location: [facility] ORS;  Service: Orthopedics;  Laterality: Left;  . COLONOSCOPY  12/18/2018   found 1 61m polyp, going back in 5 years    . COLONOSCOPY WITH PROPOFOL N/A 12/18/2018   Procedure: COLONOSCOPY WITH PROPOFOL;  Surgeon: AJonathon Bellows MD;  [facility];  Service: Gastroenterology;  Laterality: N/A;  . IRRIGATION AND DEBRIDEMENT ABSCESS N/A 01/24/2015   Procedure: IRRIGATION AND DEBRIDEMENT ABSCESS/GROIN ABSCESS;  Surgeon: CMarlyce Huge MD;  Location: [facility] ORS;  Service: General;  Laterality: N/A;  . KNEE ARTHROSCOPY W/ MENISCAL REPAIR Right 2008  . ROTATOR CUFF REPAIR Right 2015  . TONSILLECTOMY  1998    Prior to Admission medications   Medication Sig Start Date End Date Taking? Authorizing Provider  atorvastatin (LIPITOR) 10 MG tablet TAKE ONE TABLET BY MOUTH DAILY 08/17/19  Yes CLesleigh Noe MD  conjugated estrogens (PREMARIN) vaginal cream INSERT 1 GRAM VAGINALLY  NIGHTLY FOR 2 WEEKS THEN 1 GRAM EVERY OTHER NIGHT FOR 2 WEEKS THEN 1 GRAM TWO NIGHTS A WEEK THEREAFTER 07/23/19  Yes CLesleigh Noe MD  Continuous Blood Gluc Receiver ([place] DGib[place]Receiver misc  USE UTD   Yes [provider]  Continuous Blood Gluc Sensor (DEXCOM G6 SENSOR) MISC 1 kit by Does not apply route as directed. Use as directed for continuous glucose monitoring-change every 10 days DX E11.65 09/10/18  Yes Shamleffer, IMelanie Crazier MD  Continuous Blood Gluc Transmit (DEXCOM G6 TRANSMITTER) MISC 1 Device by Does not apply route as directed. Use as directed for continuous glucose monitoring-change every 3 months DX E11.65 09/08/19  Yes Shamleffer, IMelanie Crazier MD  glucose blood (CONTOUR NEXT TEST) test strip 4 times a day 07/08/18  Yes Shamleffer, IMelanie Crazier MD  insulin aspart (NOVOLOG FLEXPEN) 100 UNIT/ML FlexPen Inject 18 Units into the skin 3 (three) times daily with meals. 03/21/20  Yes Shamleffer, IMelanie Crazier MD  Insulin Pen Needle (BD PEN NEEDLE MICRO U/F) 32G X 6 MM MISC 4x a day 07/08/18  Yes Shamleffer, IMelanie Crazier MD  levothyroxine (SYNTHROID) 175 MCG tablet Take 1 tablet  (175 mcg total) by mouth daily before breakfast. 02/29/20  Yes Shamleffer, IMelanie Crazier MD  metFORMIN (GLUCOPHAGE) 1000 MG tablet Take 1 tablet (1,000 mg total) by mouth 2 (two) times daily with a meal. 02/29/20  Yes Shamleffer, IMelanie Crazier MD  pantoprazole (PROTONIX) 40 MG tablet Take 1 tablet (40 mg total) by mouth 2 (two) times daily. 03/25/19  Yes CLesleigh Noe MD  TOUJEO MAX SOLOSTAR 300 UNIT/ML Solostar Pen INJECT 3[place] Harbor[place]IN DAILY 08/17/19  Yes Shamleffer, IMelanie Crazier MD    No Known Allergies  Gynecologic History: Patient's last menstrual period was 06/28/2015 (approximate).  Obstetric History: G1P0010  Social History   Socioeconomic History  . Marital status: Single    Spouse name: Not on file  . Number of children: 0  . Years of education: Associates  . Highest education level: Not on file  Occupational History  . Not on file  Tobacco Use  . Smoking status: Former Smoker    Packs/day: 0.25    Years: 30.00    Pack years: 7.50    Types: Cigarettes    Quit date: 08/14/2018    Years since quitting: 1.7  . Smokeless tobacco: Never Used  Vaping Use  . Vaping Use: Former  Substance and Sexual Activity  . Alcohol use: Yes    Comment: a couple of times a month, typically less than 2 drinks  . Drug use: No  . Sexual activity: Not Currently    Birth control/protection: Post-menopausal  Other Topics Concern  . Not on file  Social History Narrative   Lives alone   Dog - Sugar   Enjoys: hiking - looking for local trails   Exercise: walking trails with dog   Diet: doing well, did weight watchers, watching carbs   Social Determinants of Health   Financial Resource Strain:   . Difficulty of Paying Living Expenses: Not on file  Food Insecurity:   . Worried About Charity fundraiser in the Last Year: Not on file  . Ran Out of Food in the Last Year: Not on file  Transportation Needs:   . Lack of Transportation (Medical): Not on file  . Lack of  Transportation (Non-Medical): Not on file  Physical Activity:   . Days of Exercise per Week: Not on file  . Minutes of Exercise per Session: Not on file  Stress:   . Feeling of Stress : Not on file  Social Connections:   . Frequency of Communication with Friends and Family: Not on file  . Frequency of Social Gatherings with Friends and Family: Not on file  . Attends Religious Services: Not on file  . Active Member of Clubs or Organizations: Not on file  . Attends Archivist Meetings: Not on file  . Marital Status: Not on file  Intimate Partner Violence:   . Fear of Current or Ex-Partner: Not on file  . Emotionally Abused: Not on file  . Physically Abused: Not on file  . Sexually Abused: Not on file    Family History  Problem Relation Age of Onset  . Diabetes Father   . Heart failure Father   . Diabetes Brother   . Arthritis Mother   . Diabetes Mother   . Transient ischemic attack Mother   . Heart failure Maternal Grandmother   . Alcohol abuse Maternal Grandmother   . Diabetes Paternal Grandmother   . Kidney failure Paternal Grandmother   . Heart failure Paternal Grandfather   . Cancer Paternal Grandfather        unknown type  . Hypertension Brother   . Breast cancer Maternal Great-grandmother     Review of Systems  Constitutional: Negative for chills and fever.  HENT: Negative for congestion and sore throat.   Eyes: Negative for blurred vision and double vision.  Respiratory: Negative for shortness of breath.   Cardiovascular: Negative for chest pain.  Gastrointestinal: Negative for heartburn, nausea and vomiting.  Genitourinary: Negative.   Musculoskeletal: Negative.  Negative for myalgias.  Skin: Negative for rash.  Neurological: Negative for dizziness and headaches.  Endo/Heme/Allergies: Does not bruise/bleed easily.  Psychiatric/Behavioral: Negative for depression. The patient is not nervous/anxious.      Physical Exam BP 120/80   Pulse 82   Temp  (!) 96.8 F (36 C) (Temporal)   Ht '5\' 8"'  (1.727 m)   Wt 279 lb 8 oz (126.8 kg)   LMP 06/28/2015 (Approximate)  SpO2 99%   BMI 42.50 kg/m    BP Readings from Last 3 Encounters:  05/02/20 120/80  02/28/20 122/70  12/17/19 (!) 120/90      Physical Exam Constitutional:      General: She is not in acute distress.    Appearance: She is well-developed. She is not diaphoretic.  HENT:     Head: Normocephalic and atraumatic.     Right Ear: External ear normal.     Left Ear: External ear normal.     Nose: Nose normal.  Eyes:     General: No scleral icterus.    Conjunctiva/sclera: Conjunctivae normal.  Cardiovascular:     Rate and Rhythm: Normal rate and regular rhythm.     Heart sounds: No murmur heard.   Pulmonary:     Effort: Pulmonary effort is normal. No respiratory distress.     Breath sounds: Normal breath sounds. No wheezing.  Abdominal:     General: Bowel sounds are normal. There is no distension.     Palpations: Abdomen is soft. There is no mass.     Tenderness: There is no abdominal tenderness. There is no guarding or rebound.  Musculoskeletal:        General: Normal range of motion.     Cervical back: Neck supple.  Lymphadenopathy:     Cervical: No cervical adenopathy.  Skin:    General: Skin is warm and dry.     Capillary Refill: Capillary refill takes less than 2 seconds.  Neurological:     Mental Status: She is alert and oriented to person, place, and time.     Deep Tendon Reflexes: Reflexes normal.  Psychiatric:        Behavior: Behavior normal.      Diabetic Foot Exam - Simple   Simple Foot Form Diabetic Foot exam was performed with the following findings: Yes 05/02/2020  8:44 AM  Visual Inspection No deformities, no ulcerations, no other skin breakdown bilaterally: Yes Sensation Testing Intact to touch and monofilament testing bilaterally: Yes Pulse Check Posterior Tibialis and Dorsalis pulse intact bilaterally: Yes Comments      Results:   PHQ-9:    Office Visit from 07/22/2018 in [place] at [facility]  PHQ-9 Total Score 1        Assessment: 54 y.o. G91P0010 female here for routine annual physical examination.  Plan: Problem List Items Addressed This Visit    None    Visit Diagnoses    Annual physical exam    -  Primary      Screening: -- Blood pressure screen normal -- cholesterol screening: will obtain -- Weight screening: overweight: continue to monitor -- Diabetes Screening: pt follows with endocrinology -- Nutrition: Encouraged healthy diet  The 10-year ASCVD risk score Mikey Bussing DC Jr., et al., 2013) is: 6.4%   Values used to calculate the score:     Age: 43 years     Sex: Female     Is Non-Hispanic African American: No     Diabetic: Yes     Tobacco smoker: Yes     Systolic Blood Pressure: 975 mmHg     Is BP treated: No     HDL Cholesterol: 46.9 mg/dL     Total Cholesterol: 157 mg/dL  -- Statin therapy for Age 66-75 with CVD risk >7.5%  Psych -- Depression screening (PHQ-9):    Office Visit from 07/22/2018 in [place] at [facility]  PHQ-9 Total Score 1       Safety --  tobacco screening: not using -- alcohol screening:  low-risk usage. -- no evidence of domestic violence or intimate partner violence.   Cancer Screening -- pap smear - follows with GYN - will get records -- family history of breast cancer screening: done. not at high risk. -- Mammogram - up to date -- Colon cancer (age 31+)-- up to date  Immunizations Immunization History  Administered Date(s) Administered  . Influenza,inj,Quad PF,6+ Mos 01/25/2015, 07/22/2018, 03/25/2019  . PFIZER SARS-COV-2 Vaccination 08/05/2019, 08/26/2019  . Pneumococcal Polysaccharide-23 07/22/2018  . Tdap 03/25/2019  . Zoster Recombinat (Shingrix) 03/25/2019, 06/22/2019    -- flu vaccine pt will get after her covid booster -- TDAP q10 years up to date -- Shingles (age >19) up to date -- PPSV-23 (19-64 with chronic  disease or smoking) up to date -- Covid-19 Vaccine up to date   Encouraged healthy diet and exercise. Encouraged regular vision and dental care.    [name], MD

## 2020-05-02 NOTE — Patient Instructions (Signed)

## 2020-05-03 LAB — HEPATITIS C ANTIBODY
Hepatitis C Ab: NONREACTIVE
SIGNAL TO CUT-OFF: 0.01 (ref <1.00)

## 2020-05-17 ENCOUNTER — Ambulatory Visit: Payer: BC Managed Care – PPO

## 2020-05-24 ENCOUNTER — Ambulatory Visit (INDEPENDENT_AMBULATORY_CARE_PROVIDER_SITE_OTHER): Payer: BC Managed Care – PPO

## 2020-05-24 ENCOUNTER — Encounter: Payer: Self-pay | Admitting: Family Medicine

## 2020-05-24 ENCOUNTER — Other Ambulatory Visit: Payer: Self-pay

## 2020-05-24 DIAGNOSIS — Z23 Encounter for immunization: Secondary | ICD-10-CM

## 2020-05-24 NOTE — Progress Notes (Signed)
Flu

## 2020-05-30 ENCOUNTER — Other Ambulatory Visit: Payer: Self-pay

## 2020-05-30 ENCOUNTER — Encounter: Payer: Self-pay | Admitting: Obstetrics and Gynecology

## 2020-05-30 ENCOUNTER — Ambulatory Visit (INDEPENDENT_AMBULATORY_CARE_PROVIDER_SITE_OTHER): Payer: BC Managed Care – PPO | Admitting: Obstetrics and Gynecology

## 2020-05-30 ENCOUNTER — Other Ambulatory Visit (HOSPITAL_COMMUNITY)
Admission: RE | Admit: 2020-05-30 | Discharge: 2020-05-30 | Disposition: A | Discharge status: Home / Self Care | Payer: BC Managed Care – PPO | Source: Ambulatory Visit | Attending: Obstetrics and Gynecology | Admitting: Obstetrics and Gynecology

## 2020-05-30 VITALS — BP 120/86 | Ht 68.0 in | Wt 292.0 lb

## 2020-05-30 DIAGNOSIS — Z124 Encounter for screening for malignant neoplasm of cervix: Secondary | ICD-10-CM | POA: Insufficient documentation

## 2020-05-30 DIAGNOSIS — R300 Dysuria: Secondary | ICD-10-CM | POA: Diagnosis not present

## 2020-05-30 DIAGNOSIS — Z01419 Encounter for gynecological examination (general) (routine) without abnormal findings: Secondary | ICD-10-CM | POA: Diagnosis not present

## 2020-05-30 DIAGNOSIS — Z1239 Encounter for other screening for malignant neoplasm of breast: Secondary | ICD-10-CM

## 2020-05-30 NOTE — Progress Notes (Signed)
Gynecology Annual Exam  PCP: [name], MD  Chief Complaint:  Chief Complaint  Patient presents with  . Gynecologic Exam    Annual - no concerns. RM 5    History of Present Illness:Patient is a 55 y.o. G1P0010 presents for annual exam. The patient has no complaints today.   LMP: Patient's last menstrual period was 06/28/2015 (approximate). No PMB  The patient is sexually active. She denies dyspareunia.  The patient does perform self breast exams.  There is no notable family history of breast or ovarian cancer in her family.  The patient wears seatbelts: yes.   The patient has regular exercise: not asked.    The patient denies current symptoms of depression.     Review of Systems: Review of Systems  Constitutional: Negative for chills and fever.  HENT: Negative for congestion.   Respiratory: Negative for cough and shortness of breath.   Cardiovascular: Negative for chest pain and palpitations.  Gastrointestinal: Negative for abdominal pain, constipation, diarrhea, heartburn, nausea and vomiting.  Genitourinary: Negative for dysuria, frequency and urgency.  Skin: Negative for itching and rash.  Neurological: Negative for dizziness and headaches.  Endo/Heme/Allergies: Negative for polydipsia.  Psychiatric/Behavioral: Negative for depression.    Past Medical History:  Patient Active Problem List   Diagnosis Date Noted  . Left-sided chest wall pain 12/17/2019  . Type 2 diabetes mellitus with diabetic polyneuropathy, with long-term current use of insulin ([place]) 09/10/2018  . Tobacco use 07/22/2018  . Hypothyroidism 07/22/2018  . Hyperlipidemia associated with type 2 diabetes mellitus ([place]) 07/22/2018  . Poorly controlled type 2 diabetes mellitus ([place]) 01/22/2015    Past Surgical History:  Past Surgical History:  Procedure Laterality Date  . APPENDECTOMY  1993  . CALCANEAL OSTEOTOMY Left 07/17/2016   Procedure: CALCANEAL OSTEOTOMY;  Surgeon: [name], MD;   Location: [facility] ORS;  Service: Orthopedics;  Laterality: Left;  . COLONOSCOPY  12/18/2018   found 1 89m polyp, going back in 5 years   . COLONOSCOPY WITH PROPOFOL N/A 12/18/2018   Procedure: COLONOSCOPY WITH PROPOFOL;  Surgeon: AJonathon Bellows MD;  [facility];  Service: Gastroenterology;  Laterality: N/A;  . IRRIGATION AND DEBRIDEMENT ABSCESS N/A 01/24/2015   Procedure: IRRIGATION AND DEBRIDEMENT ABSCESS/GROIN ABSCESS;  Surgeon: CMarlyce Huge MD;  Location: [facility] ORS;  Service: General;  Laterality: N/A;  . KNEE ARTHROSCOPY W/ MENISCAL REPAIR Right 2008  . ROTATOR CUFF REPAIR Right 2015  . TONSILLECTOMY  1998    Gynecologic History:  Patient's last menstrual period was 06/28/2015 (approximate). Last Pap: Results were: 01/15/2019 Negative ectocervical biopsy and ECC 12/22/2018 NIL and HR HPV+  Last mammogram: 04/28/2020 Results were: BI-RAD II  Obstetric History: G1P0010  Family History:  Family History  Problem Relation Age of Onset  . Diabetes Father   . Heart failure Father   . Diabetes Brother   . Arthritis Mother   . Diabetes Mother   . Transient ischemic attack Mother   . Heart failure Maternal Grandmother   . Alcohol abuse Maternal Grandmother   . Diabetes Paternal Grandmother   . Kidney failure Paternal Grandmother   . Heart failure Paternal Grandfather   . Cancer Paternal Grandfather        unknown type  . Hypertension Brother   . Breast cancer Maternal Great-grandmother     Social History:  Social History   Socioeconomic History  . Marital status: Single    Spouse name: Not on file  . Number of children: 0  .  Years of education: Associates  . Highest education level: Not on file  Occupational History  . Not on file  Tobacco Use  . Smoking status: Former Smoker    Packs/day: 0.25    Years: 30.00    Pack years: 7.50    Types: Cigarettes    Quit date: 08/14/2018    Years since quitting: 1.7  . Smokeless tobacco: Never Used  Vaping Use  .  Vaping Use: Former  Substance and Sexual Activity  . Alcohol use: Yes    Comment: a couple of times a month, typically less than 2 drinks  . Drug use: No  . Sexual activity: Not Currently    Birth control/protection: Post-menopausal  Other Topics Concern  . Not on file  Social History Narrative   Lives alone   Dog - Sugar   Enjoys: hiking - looking for local trails   Exercise: walking trails with dog   Diet: doing well, did weight watchers, watching carbs   Social Determinants of Health   Financial Resource Strain: Not on file  Food Insecurity: Not on file  Transportation Needs: Not on file  Physical Activity: Not on file  Stress: Not on file  Social Connections: Not on file  Intimate Partner Violence: Not on file    Allergies:  No Known Allergies  Medications: Prior to Admission medications   Medication Sig Start Date End Date Taking? Authorizing Provider  atorvastatin (LIPITOR) 20 MG tablet Take 1 tablet (20 mg total) by mouth daily. 05/02/20   [name], MD  conjugated estrogens (PREMARIN) vaginal cream INSERT 1 GRAM VAGINALLY  NIGHTLY FOR 2 WEEKS THEN 1 GRAM EVERY OTHER NIGHT FOR 2 WEEKS THEN 1 GRAM TWO NIGHTS A WEEK THEREAFTER 07/23/19   [name], MD  Continuous Blood Gluc Receiver ([place]) [place] Receiver misc  USE UTD    [provider]  Continuous Blood Gluc Sensor (DEXCOM G6 SENSOR) MISC 1 kit by Does not apply route as directed. Use as directed for continuous glucose monitoring-change every 10 days DX E11.65 09/10/18   Shamleffer, Melanie Crazier, MD  Continuous Blood Gluc Transmit (DEXCOM G6 TRANSMITTER) MISC 1 Device by Does not apply route as directed. Use as directed for continuous glucose monitoring-change every 3 months DX E11.65 09/08/19   Shamleffer, Melanie Crazier, MD  glucose blood (CONTOUR NEXT TEST) test strip 4 times a day 07/08/18   Shamleffer, Melanie Crazier, MD  insulin aspart (NOVOLOG FLEXPEN) 100 UNIT/ML  FlexPen Inject 18 Units into the skin 3 (three) times daily with meals. 03/21/20   Shamleffer, Melanie Crazier, MD  Insulin Pen Needle (BD PEN NEEDLE MICRO U/F) 32G X 6 MM MISC 4x a day 07/08/18   Shamleffer, Melanie Crazier, MD  levothyroxine (SYNTHROID) 175 MCG tablet Take 1 tablet (175 mcg total) by mouth daily before breakfast. 02/29/20   Shamleffer, Melanie Crazier, MD  metFORMIN (GLUCOPHAGE) 1000 MG tablet Take 1 tablet (1,000 mg total) by mouth 2 (two) times daily with a meal. 02/29/20   Shamleffer, Melanie Crazier, MD  pantoprazole (PROTONIX) 40 MG tablet Take 1 tablet (40 mg total) by mouth 2 (two) times daily. 03/25/19   [name], MD  TOUJEO MAX SOLOSTAR 300 UNIT/ML Solostar Pen INJECT 36 UNITS INTO THE SKIN DAILY 08/17/19   Shamleffer, Melanie Crazier, MD    Physical Exam Vitals: Blood pressure 120/86, height _0  (1.727 m), weight 292 lb (132.5 kg), last menstrual period 06/28/2015.  General: NAD HEENT: normocephalic, anicteric Thyroid:  no enlargement, no palpable nodules Pulmonary: No increased work of breathing, CTAB Cardiovascular: RRR, distal pulses 2+ Breast: Breast symmetrical, no tenderness, no palpable nodules or masses, no skin or nipple retraction present, no nipple discharge.  No axillary or supraclavicular lymphadenopathy. Abdomen: NABS, soft, non-tender, non-distended.  Umbilicus without lesions.  No hepatomegaly, splenomegaly or masses palpable. No evidence of hernia  Genitourinary:  External: Normal external female genitalia.  Normal urethral meatus, normal Bartholin's and Skene's glands.    Vagina: Normal vaginal mucosa, no evidence of prolapse.    Cervix: Grossly normal in appearance, no bleeding  Uterus: Non-enlarged, mobile, normal contour.  No CMT  Adnexa: ovaries non-enlarged, no adnexal masses  Rectal: deferred  Lymphatic: no evidence of inguinal lymphadenopathy Extremities: no edema, erythema, or tenderness Neurologic: Grossly intact Psychiatric:  mood appropriate, affect full  Female chaperone present for pelvic and breast  portions of the physical exam     Assessment: 55 y.o. G1P0010 routine annual exam  Plan: Problem List Items Addressed This Visit   None   Visit Diagnoses    Breast screening    -  Primary   Screening for malignant neoplasm of cervix       Relevant Orders   Cytology - PAP   Encounter for gynecological examination without abnormal finding       Dysuria       Relevant Orders   Urine Culture      1) Mammogram - recommend yearly screening mammogram.  Mammogram Is up to date  2) STI screening  was notoffered and therefore not obtained  3) ASCCP guidelines and rational discussed.  Patient opts for every 3 years screening interval - repeat from last year per ASCCP guidelines NILM HPV positive pap (HPV 18 positive) with negative colposcopy  4) Osteoporosis  - per USPTF routine screening DEXA at age 21  5) Routine healthcare maintenance including cholesterol, diabetes screening discussed managed by PCP  6) Colonoscopy - UTD 12/18/2018  7) Return in about 1 year (around 05/30/2021) for annual.    Malachy Mood, MD [place], [facility] 05/30/2020, 3:45 PM

## 2020-06-02 LAB — URINE CULTURE

## 2020-06-07 LAB — CYTOLOGY - PAP
Comment: NEGATIVE
Comment: NEGATIVE
Diagnosis: UNDETERMINED — AB
HPV 16: NEGATIVE
HPV 18 / 45: POSITIVE — AB
High risk HPV: POSITIVE — AB

## 2020-06-08 NOTE — Progress Notes (Signed)
Needs colposcopy in the next 2-6 weeks

## 2020-06-09 ENCOUNTER — Telehealth: Payer: Self-pay

## 2020-06-09 NOTE — Telephone Encounter (Signed)
-----   Message from Malachy Mood, MD sent at 06/08/2020  5:14 PM EST ----- Needs colposcopy in the next 2-6 weeks.

## 2020-06-09 NOTE — Telephone Encounter (Signed)
Called and left voicemail for patient to call back to be scheduled. 

## 2020-06-19 ENCOUNTER — Telehealth (INDEPENDENT_AMBULATORY_CARE_PROVIDER_SITE_OTHER): Payer: BC Managed Care – PPO | Admitting: Family Medicine

## 2020-06-19 ENCOUNTER — Other Ambulatory Visit: Payer: Self-pay | Admitting: Family Medicine

## 2020-06-19 ENCOUNTER — Encounter: Payer: Self-pay | Admitting: Family Medicine

## 2020-06-19 ENCOUNTER — Other Ambulatory Visit: Payer: Self-pay

## 2020-06-19 ENCOUNTER — Other Ambulatory Visit: Payer: BC Managed Care – PPO

## 2020-06-19 VITALS — Temp 97.9°F | Ht 68.0 in

## 2020-06-19 DIAGNOSIS — J4521 Mild intermittent asthma with (acute) exacerbation: Secondary | ICD-10-CM

## 2020-06-19 DIAGNOSIS — Z20822 Contact with and (suspected) exposure to covid-19: Secondary | ICD-10-CM

## 2020-06-19 MED ORDER — ALBUTEROL SULFATE HFA 108 (90 BASE) MCG/ACT IN AERS: 2.0000 | INHALATION_SPRAY | Freq: Four times a day (QID) | RESPIRATORY_TRACT | 3 refills | Status: DC | PRN

## 2020-06-19 NOTE — Progress Notes (Signed)
covid test

## 2020-06-19 NOTE — Progress Notes (Signed)
Lab appointment scheduled today at 4:00 pm for Covid testing.  Back door information provided.

## 2020-06-19 NOTE — Progress Notes (Signed)
Tanya Graves T. Avid Guillette, MD Primary Care and Sports Medicine [facility] at [facility] [place], [address] Phone: [phone]  FAX: [place] - 55 y.o. female  MRN [id number]  Date of Birth: Jun 09, 1965  Visit Date: 06/19/2020  PCP: [name], MD  Referred by: [name], MD  Virtual Visit via Video Note:  I connected with  Tanya Graves on 06/19/2020  3:20 PM EST by a video enabled telemedicine application and verified that I am speaking with the correct person using two identifiers.   Location patient: home computer, tablet, or smartphone Location provider: work or home office Consent: Verbal consent directly obtained from [employer]. Persons participating in the virtual visit: patient, provider  I discussed the limitations of evaluation and management by telemedicine and the availability of in person appointments. The patient expressed understanding and agreed to proceed.  Chief Complaint  Patient presents with  . Cough    Dry  . Generalized Body Aches  . Headache  . Sore Throat  . Nasal Congestion    History of Present Illness:  Symptoms started yesterday.  She does get some seasonal asthma.  Has gotten tighter and tighter.  She has multiple symptoms ongoing, listed above.  She denies any GI symptoms.  She is not having any neurological change.  Significant headache, sore throat, body aches.  She also is having some nasal congestion and cough.  Of note her blood sugar is now greater than 300 and she has been having quite a bit of difficulty regulating.  Immunization History  Administered Date(s) Administered  . Influenza,inj,Quad PF,6+ Mos 01/25/2015, 07/22/2018, 03/25/2019, 05/24/2020  . PFIZER(Purple Top)SARS-COV-2 Vaccination 08/05/2019, 08/26/2019, 05/03/2020  . Pneumococcal Polysaccharide-23 07/22/2018  . Tdap 03/25/2019  . Zoster Recombinat (Shingrix) 03/25/2019, 06/22/2019      Lab Results  Component Value Date   HGBA1C 11.0 (A) 02/28/2020    Review of Systems as above: See pertinent positives and pertinent negatives per HPI No acute distress verbally   Observations/Objective/Exam:  An attempt was made to discern vital signs over the phone and per patient if applicable and possible.   General:    Alert, Oriented, appears well and in no acute distress  Pulmonary:     On inspection no signs of respiratory distress.  Psych / Neurological:     Pleasant and cooperative.  Assessment and Plan:    ICD-10-CM   1. Suspected COVID-19 virus infection  Z20.822   2. Mild intermittent asthma with exacerbation  J45.21    Check for COVID-19 today.  Reviewed quarantine. Albuterol as needed wheezing.  Avoid steroids in a patient with hemoglobin A1c of 11.  I discussed the assessment and treatment plan with the patient. The patient was provided an opportunity to ask questions and all were answered. The patient agreed with the plan and demonstrated an understanding of the instructions.   The patient was advised to call back or seek an in-person evaluation if the symptoms worsen or if the condition fails to improve as anticipated.  Follow-up: prn unless noted otherwise below No follow-ups on file.  Meds ordered this encounter  Medications  . albuterol (VENTOLIN HFA) 108 (90 Base) MCG/ACT inhaler    Sig: Inhale 2 puffs into the lungs every 6 (six) hours as needed for wheezing or shortness of breath.    Dispense:  18 g    Refill:  3   No orders of the defined  types were placed in this encounter.   Signed,  [name]. Jonell Brumbaugh, MD

## 2020-06-20 ENCOUNTER — Other Ambulatory Visit: Payer: Self-pay | Admitting: Internal Medicine

## 2020-06-20 LAB — SARS-COV-2, NAA 2 DAY TAT

## 2020-06-20 LAB — NOVEL CORONAVIRUS, NAA: SARS-CoV-2, NAA: NOT DETECTED

## 2020-06-28 ENCOUNTER — Other Ambulatory Visit: Payer: Self-pay

## 2020-06-30 ENCOUNTER — Ambulatory Visit: Payer: BC Managed Care – PPO | Admitting: Internal Medicine

## 2020-06-30 ENCOUNTER — Encounter: Payer: Self-pay | Admitting: Internal Medicine

## 2020-06-30 ENCOUNTER — Other Ambulatory Visit: Payer: Self-pay

## 2020-06-30 VITALS — BP 128/78 | HR 88 | Ht 68.0 in | Wt 285.8 lb

## 2020-06-30 DIAGNOSIS — E1165 Type 2 diabetes mellitus with hyperglycemia: Secondary | ICD-10-CM | POA: Insufficient documentation

## 2020-06-30 DIAGNOSIS — Z794 Long term (current) use of insulin: Secondary | ICD-10-CM

## 2020-06-30 DIAGNOSIS — E1142 Type 2 diabetes mellitus with diabetic polyneuropathy: Secondary | ICD-10-CM

## 2020-06-30 LAB — POCT GLYCOSYLATED HEMOGLOBIN (HGB A1C): Hemoglobin A1C: 10.5 % — AB (ref 4.0–5.6)

## 2020-06-30 NOTE — Progress Notes (Signed)
Name: Tanya Graves  Age/ Sex: 55 y.o., female   MRN/ DOB: [id number], April 04, 1966     PCP: [name], MD   Reason for Endocrinology Evaluation: Type 2 Diabetes Mellitus  Initial Endocrine Consultative Visit: 07/08/2018    PATIENT IDENTIFIER: Tanya Graves is a 55 y.o. female with a past medical history of T2DM, HTN, Dyslipidemia and hypothyroidism . The patient has followed with Endocrinology clinic since 07/08/2018 for consultative assistance with management of her diabetes.  DIABETIC HISTORY:  Tanya Graves was diagnosed with T2DM ~ 2010.  She does not recall intolerance to other oral glycemic agents.  On her initial visit here she was on MDI regimen of insulin and metformin. Her hemoglobin A1c has ranged from 11.4%  in 2016, peaking at 12.5% in 2019  GLP-1 and SGLT-2 inhibitors have been cost prohibitive    Thyroid Disease  She is S/P RAI ablation years ago > 20 yrs ago and has been on levothyroxine . She is on Levothyroxine 175 mcg daily   SUBJECTIVE:   During the last visit (09/06/2019): A1c 11.1% . We continued Metformin, Farxiga, and adjusted MDI regime and started Rybelsus     Today (06/30/2020): Tanya Graves for a follow-up on diabetes management.  She checks her blood sugars 4 times daily, preprandial and bedtime. The patient has had hypoglycemic episodes since the last clinic visit.   Unfortunately she has lost 3 family members in the past 3 months including her father  She has noted ultra-sensitivity of the left shin area    HOME DIABETES REGIMEN:  Metformin 1000 mg twice daily with meals Toujeo 24 units daily   Novolog  18 units TIDQAC- has been taking 14 units      CONTINUOUS GLUCOSE MONITORING RECORD INTERPRETATION    Dates of Recording: 1/22-06/30/2020  Sensor description: Dexcom  Results statistics:   CGM use % of time 100  Average and SD 265/85  Time in range   14  %  % Time Above 180 26  % Time above 250 54  % Time Below target <1     Glycemic patterns summary: Hyperglycemia during the day and night   Hyperglycemic episodes all day  (postprandial)  Hypoglycemic episodes occurred none in the past 2 weeks     HISTORY:  Past Medical History:  Past Medical History:  Diagnosis Date  . Arthritis   . Diabetes mellitus without complication ([place])    type 2  . Thyroid disease    Past Surgical History:  Past Surgical History:  Procedure Laterality Date  . APPENDECTOMY  1993  . CALCANEAL OSTEOTOMY Left 07/17/2016   Procedure: CALCANEAL OSTEOTOMY;  Surgeon: [name], MD;  Location: [facility] ORS;  Service: Orthopedics;  Laterality: Left;  . COLONOSCOPY  12/18/2018   found 1 57mm polyp, going back in 5 years   . COLONOSCOPY WITH PROPOFOL N/A 12/18/2018   Procedure: COLONOSCOPY WITH PROPOFOL;  Surgeon: [name], MD;  Location: [facility] END[facility]COPY;  Service: Gastroenterology;  Laterality: N/A;  . IRRIGATION AND DEBRIDEMENT ABSCESS N/A 01/24/2015   Procedure: IRRIGATION AND DEBRIDEMENT ABSCESS/GROIN ABSCESS;  Surgeon: [name], MD;  Location: [facility] ORS;  Service: General;  Laterality: N/A;  . KNEE ARTHROSCOPY W/ MENISCAL REPAIR Right 2008  . ROTATOR CUFF REPAIR Right 2015  . TONSILLECTOMY  1998    Social History:  reports that she quit smoking about 22 months ago. Her smoking use included cigarettes. She has a 7.50 pack-year smoking history. She has never used smokeless  tobacco. She reports current alcohol use. She reports that she does not use drugs. Family History:  Family History  Problem Relation Age of Onset  . Diabetes Father   . Heart failure Father   . Diabetes Brother   . Arthritis Mother   . Diabetes Mother   . Transient ischemic attack Mother   . Heart failure Maternal Grandmother   . Alcohol abuse Maternal Grandmother   . Diabetes Paternal Grandmother   . Kidney failure Paternal Grandmother   . Heart failure Paternal Grandfather   . Cancer Paternal Grandfather        unknown type  .  Hypertension Brother   . Breast cancer Maternal Great-grandmother      HOME MEDICATIONS:    OBJECTIVE:   Vital Signs: BP 128/78   Pulse 88   Ht 5\' 8"  (1.727 m)   Wt 285 lb 12.8 oz (129.6 kg)   LMP 06/28/2015 (Approximate)   SpO2 99%   BMI 43.46 kg/m   Wt Readings from Last 3 Encounters:  06/30/20 285 lb 12.8 oz (129.6 kg)  05/30/20 292 lb (132.5 kg)  05/02/20 279 lb 8 oz (126.8 kg)     Exam: General: Pt appears well and is in NAD  Lungs: Clear with good BS bilat with no rales, rhonchi, or wheezes  Heart: RRR with normal S1 and S2 and no gallops; no murmurs; no rub  Extremities: No pretibial edema.   Neuro: MS is good with appropriate affect, pt is alert and Ox3   DM foot exam: 02/28/2020 The skin of the feet is intact without sores or ulcerations. The pedal pulses are 2+ on right and 2+ on left. The sensation is intact to a screening 5.07, 10 gram monofilament bilaterally     DATA REVIEWED:  Lab Results  Component Value Date   HGBA1C 10.5 (A) 06/30/2020   HGBA1C 11.0 (A) 02/28/2020   HGBA1C 11.7 (A) 09/07/2019   Lab Results  Component Value Date   MICROALBUR 4.7 (H) 05/02/2020   LDLCALC 82 05/02/2020   CREATININE 0.75 05/02/2020   Lab Results  Component Value Date   MICRALBCREAT 2.7 05/02/2020     Lab Results  Component Value Date   CHOL 159 05/02/2020   HDL 59.40 05/02/2020   LDLCALC 82 05/02/2020   LDLDIRECT 88.0 03/25/2019   TRIG 88.0 05/02/2020   CHOLHDL 3 05/02/2020         ASSESSMENT / PLAN / RECOMMENDATIONS:  1)Type 2 Diabetes Mellitus, Poorly controlled, With Neuropathic complications - Most recent A1c of 10.5 %. Goal A1c < 7.0 %.   - In review of her CGM for the past 2 weeks, there has been persistent hyperglycemia with an average of 265 mg/dL but she endorsed hypoglycemia today after lunch, which was not captured on the download. This is explained by Cab- Insulin mismatch, she most likely ate less stach for lunch before her visit  today and the 14 units of Novolog was too much for that meal. I am going to  Refer her to our RD for proper discussion about this - In the meantime and because her history of hypoglycemia is discordant with the CGM download, I am not going to change her insulin doses but I am going to provide her with correction scale, we discussed in detail for this to be used at mealtimes   MEDICATIONS:  Continue metformin 1000 mg twice daily   Continue Toujeo 24 units daily   Continue NovoLog  14 units with each meal  Start  CF : Novolog ( BG-130/25)     EDUCATION / INSTRUCTIONS:  BG monitoring instructions: Patient is instructed to check her blood sugars 4 times a day, before meals and bedtime.  Call [facility] if: BG persistently < 70  . I reviewed the Rule of 15 for the treatment of hypoglycemia in detail with the patient. Literature supplied.  2) Post ablative hypothyroidism:  -Repeat TSH is normal in 02/2020 -No local neck symptoms  Medication Continue levothyroxine 175 MCG daily   3) Peripheral Neuropathy:  - Emphasized the importance of glycemic control     F/U in 3 months     Signed electronically by: Mack Guise, MD  [facility] Endocrinology  [facility] [address] [place], [place] [address] Phone: [phone] FAX: [phone]   CC: [name], [place] [address] Phone: [phone]  Fax: [phone]  Return to Endocrinology clinic as below: Future Appointments  Date Time Provider [place]  07/11/2020  8:30 AM Malachy Mood, MD [facility] None

## 2020-06-30 NOTE — Patient Instructions (Signed)
-   Continue Toujeo 24  units daily  - Novolog 14 units with each meal   - Metformin 1000 mg Twice a day  - Novolog correctional insulin: ADD extra units on insulin to your meal-time Novolog dose if your blood sugars are higher than 155 . Use the scale below to help guide you:   Blood sugar before meal Number of units to inject  Less than 155 0 unit  156 -  180 1 units  181 -  205 2 units  206 -  230 3 units  231 -  255 4 units  256 -  280 5 units  281 -  305 6 units  306 -  330 7 units  331 -  355 8 units  356- 380 9 units          HOW TO TREAT LOW BLOOD SUGARS (Blood sugar LESS THAN 70 MG/DL)  Please follow the RULE OF 15 for the treatment of hypoglycemia treatment (when your (blood sugars are less than 70 mg/dL)    STEP 1: Take 15 grams of carbohydrates when your blood sugar is low, which includes:   3-4 GLUCOSE TABS  OR  3-4 OZ OF JUICE OR REGULAR SODA OR  ONE TUBE OF GLUCOSE GEL     STEP 2: RECHECK blood sugar in 15 MINUTES STEP 3: If your blood sugar is still low at the 15 minute recheck --> then, go back to STEP 1 and treat AGAIN with another 15 grams of carbohydrates.

## 2020-07-11 ENCOUNTER — Other Ambulatory Visit (HOSPITAL_COMMUNITY)
Admission: RE | Admit: 2020-07-11 | Discharge: 2020-07-11 | Disposition: A | Discharge status: Home / Self Care | Payer: BC Managed Care – PPO | Source: Ambulatory Visit | Attending: Obstetrics and Gynecology | Admitting: Obstetrics and Gynecology

## 2020-07-11 ENCOUNTER — Encounter: Payer: Self-pay | Admitting: Obstetrics and Gynecology

## 2020-07-11 ENCOUNTER — Other Ambulatory Visit: Payer: Self-pay

## 2020-07-11 ENCOUNTER — Ambulatory Visit (INDEPENDENT_AMBULATORY_CARE_PROVIDER_SITE_OTHER): Payer: BC Managed Care – PPO | Admitting: Obstetrics and Gynecology

## 2020-07-11 VITALS — BP 126/72 | Wt 289.0 lb

## 2020-07-11 DIAGNOSIS — R87810 Cervical high risk human papillomavirus (HPV) DNA test positive: Secondary | ICD-10-CM | POA: Diagnosis present

## 2020-07-11 DIAGNOSIS — R87610 Atypical squamous cells of undetermined significance on cytologic smear of cervix (ASC-US): Secondary | ICD-10-CM | POA: Insufficient documentation

## 2020-07-11 NOTE — Progress Notes (Signed)
Obstetrics & Gynecology Office Visit   Chief Complaint:  Chief Complaint  Patient presents with  . Colposcopy    Colpo - Procedure RM    History of Present Illness:Tanya Graves is a 55 y.o. woman who presents today for continued surveillance for history of dysplasia. Last pap obtained on 05/30/2020 revealed ASCUS HPV positive, subtype 18 positive.  Prior    01/15/2019 Colposcopy negative ectocervix and ECC 12/22/2018 NILM HPV 16 positive Pap 07/13/2015 NILM HPV negative.    Review of Systems: Review of systems negative unless noted in HPI  Past Medical History:  Patient Active Problem List   Diagnosis Date Noted  . Type 2 diabetes mellitus with hyperglycemia, with long-term current use of insulin ([place]) 06/30/2020  . Left-sided chest wall pain 12/17/2019  . Type 2 diabetes mellitus with diabetic polyneuropathy, with long-term current use of insulin ([place]) 09/10/2018  . Tobacco use 07/22/2018  . Hypothyroidism 07/22/2018  . Hyperlipidemia associated with type 2 diabetes mellitus ([place]) 07/22/2018  . Poorly controlled type 2 diabetes mellitus ([place]) 01/22/2015    Past Surgical History:  Patient Active Problem List   Diagnosis Date Noted  . Type 2 diabetes mellitus with hyperglycemia, with long-term current use of insulin ([place]) 06/30/2020  . Left-sided chest wall pain 12/17/2019  . Type 2 diabetes mellitus with diabetic polyneuropathy, with long-term current use of insulin ([place]) 09/10/2018  . Tobacco use 07/22/2018  . Hypothyroidism 07/22/2018  . Hyperlipidemia associated with type 2 diabetes mellitus ([place]) 07/22/2018  . Poorly controlled type 2 diabetes mellitus ([place]) 01/22/2015    Gynecologic History: Patient's last menstrual period was 06/28/2015 (approximate).  Obstetric History: G1P0010  Family History:  Family History  Problem Relation Age of Onset  . Diabetes Father   . Heart failure Father   . Diabetes Brother   . Arthritis Mother   . Diabetes Mother   .  Transient ischemic attack Mother   . Heart failure Maternal Grandmother   . Alcohol abuse Maternal Grandmother   . Diabetes Paternal Grandmother   . Kidney failure Paternal Grandmother   . Heart failure Paternal Grandfather   . Cancer Paternal Grandfather        unknown type  . Hypertension Brother   . Breast cancer Maternal Great-grandmother     Social History:  Social History   Socioeconomic History  . Marital status: Single    Spouse name: Not on file  . Number of children: 0  . Years of education: Associates  . Highest education level: Not on file  Occupational History  . Not on file  Tobacco Use  . Smoking status: Former Smoker    Packs/day: 0.25    Years: 30.00    Pack years: 7.50    Types: Cigarettes    Quit date: 08/14/2018    Years since quitting: 1.9  . Smokeless tobacco: Never Used  Vaping Use  . Vaping Use: Former  Substance and Sexual Activity  . Alcohol use: Yes    Comment: a couple of times a month, typically less than 2 drinks  . Drug use: No  . Sexual activity: Not Currently    Birth control/protection: Post-menopausal  Other Topics Concern  . Not on file  Social History Narrative   Lives alone   Dog - Sugar   Enjoys: hiking - looking for local trails   Exercise: walking trails with dog   Diet: doing well, did weight watchers, watching carbs   Social Determinants of Health  Financial Resource Strain: Not on file  Food Insecurity: Not on file  Transportation Needs: Not on file  Physical Activity: Not on file  Stress: Not on file  Social Connections: Not on file  Intimate Partner Violence: Not on file    Allergies:  No Known Allergies  Medications: Prior to Admission medications   Medication Sig Start Date End Date Taking? Authorizing Provider  albuterol (VENTOLIN HFA) 108 (90 Base) MCG/ACT inhaler Inhale 2 puffs into the lungs every 6 (six) hours as needed for wheezing or shortness of breath. 06/19/20  Yes Copland, Frederico Hamman, MD   atorvastatin (LIPITOR) 20 MG tablet Take 1 tablet (20 mg total) by mouth daily. 05/02/20  Yes [name], MD  conjugated estrogens (PREMARIN) vaginal cream INSERT 1 GRAM VAGINALLY  NIGHTLY FOR 2 WEEKS THEN 1 GRAM EVERY OTHER NIGHT FOR 2 WEEKS THEN 1 GRAM TWO NIGHTS A WEEK THEREAFTER 07/23/19  Yes [name], MD  Continuous Blood Gluc Receiver ([place]) Eva Receiver misc  USE UTD   Yes [provider]  insulin aspart (NOVOLOG FLEXPEN) 100 UNIT/ML FlexPen Inject 18 Units into the skin 3 (three) times daily with meals. 03/21/20  Yes Shamleffer, Melanie Crazier, MD  levothyroxine (SYNTHROID) 175 MCG tablet Take 1 tablet (175 mcg total) by mouth daily before breakfast. 02/29/20  Yes Shamleffer, Melanie Crazier, MD  metFORMIN (GLUCOPHAGE) 1000 MG tablet Take 1 tablet (1,000 mg total) by mouth 2 (two) times daily with a meal. 02/29/20  Yes Shamleffer, Melanie Crazier, MD  TOUJEO MAX SOLOSTAR 300 UNIT/ML Solostar Pen INJECT 36 UNITS UNDER THE SKIN DAILY 06/20/20  Yes Shamleffer, Melanie Crazier, MD  Continuous Blood Gluc Sensor (DEXCOM G6 SENSOR) MISC 1 kit by Does not apply route as directed. Use as directed for continuous glucose monitoring-change every 10 days DX E11.65 09/10/18   Shamleffer, Melanie Crazier, MD  Continuous Blood Gluc Transmit (DEXCOM G6 TRANSMITTER) MISC 1 Device by Does not apply route as directed. Use as directed for continuous glucose monitoring-change every 3 months DX E11.65 09/08/19   Shamleffer, Melanie Crazier, MD  glucose blood (CONTOUR NEXT TEST) test strip 4 times a day 07/08/18   Shamleffer, Melanie Crazier, MD  Insulin Pen Needle (BD PEN NEEDLE MICRO U/F) 32G X 6 MM MISC 4x a day 07/08/18   Shamleffer, Melanie Crazier, MD  pantoprazole (PROTONIX) 40 MG tablet Take 1 tablet (40 mg total) by mouth 2 (two) times daily. 03/25/19   [name], MD    Physical Exam Vitals:  Vitals:   07/11/20 0811  BP: 126/72   Patient's last menstrual  period was 06/28/2015 (approximate).  General: NAD HEENT: normocephalic, anicteric Thyroid: no enlargement, no palpable nodules Pulmonary: No increased work of breathing Genitourinary:  External: Normal external female genitalia.  Normal urethral meatus, normal  Bartholin's and Skene's glands.    Vagina: Normal vaginal mucosa, no evidence of prolapse.    Cervix: Grossly normal in appearance, no bleeding  Uterus: Non-enlarged, mobile, normal contour.  No CMT  Adnexa: ovaries non-enlarged, no adnexal masses  Rectal: deferred  Lymphatic: no evidence of inguinal lymphadenopathy Extremities: no edema, erythema, or tenderness Neurologic: Grossly intact Psychiatric: mood appropriate, affect full  Female chaperone present for pelvic and breast  portions of the physical exam    GYNECOLOGY CLINIC COLPOSCOPY PROCEDURE NOTE  55 y.o. G1P0010 here for colposcopy for pap obtained 05/30/2020 revealed ASCUS HPV positive, subtype 18 positive. Discussed underlying role for HPV infection in the development of cervical dysplasia,  its natural history and progression/regression, need for surveillance.  Is the patient  pregnant: No LMP: Patient's last menstrual period was 06/28/2015 (approximate). Smoking status:  reports that she quit smoking about 22 months ago. Her smoking use included cigarettes. She has a 7.50 pack-year smoking history. She has never used smokeless tobacco.  Patient given informed consent, signed copy in the chart, time out was performed.  The patient was position in dorsal lithotomy position. Speculum was placed the cervix was visualized.   After application of acetic acid colposcopic inspection of the cervix was undertaken.   Colposcopy adequate, full visualization of transformation zone: Yes Very minor aceto white changes at 1 [place]}; corresponding biopsies obtained.   ECC specimen obtained:  Yes  All specimens were labeled and sent to pathology.   Patient was given post  procedure instructions.  Will follow up pathology and manage accordingly.  Routine preventative health maintenance measures emphasized.  OBGyn Exam  Malachy Mood, MD, [facility], [facility]    Assessment: 55 y.o. G1P0010 follow up for ASCUS HPV 18 pos pap  Plan: Problem List Items Addressed This Visit   None   Visit Diagnoses    ASCUS with positive high risk HPV cervical    -  Primary   Relevant Orders   Surgical pathology      - Follow up pap smear from today.   - I had a lengthly discussion with Tanya Graves  regarding the cause of dysplasia of the lower genital tract (including immunosuppression in the setting of HPV exposure and tobacco exposure). I explained the potential for progression to invasive malignancy, the recurrent nature of these lesions (and the need for close continued followup). Results of today's pap will dictate need for further evaluation and follow up per ASCCP guidelines..  - She is comfortable with the plan and had her questions answered.  - Return in about 11 months (around 06/10/2021) for annual.   Malachy Mood, MD, [place], [facility] 07/11/2020, 8:37 AM

## 2020-07-13 LAB — SURGICAL PATHOLOGY

## 2020-07-17 LAB — HM DIABETES EYE EXAM

## 2020-07-18 ENCOUNTER — Telehealth: Payer: Self-pay

## 2020-07-18 NOTE — Telephone Encounter (Signed)
Pt calling; would like her pathology results explained to her.  [phone] Pt aware AMS in OR today; msg will be sent to him.

## 2020-07-25 NOTE — Telephone Encounter (Signed)
See Mychart msg 07/20/20.

## 2020-07-26 LAB — HM DIABETES EYE EXAM

## 2020-07-27 ENCOUNTER — Encounter: Payer: Self-pay | Admitting: Family Medicine

## 2020-07-27 DIAGNOSIS — E1139 Type 2 diabetes mellitus with other diabetic ophthalmic complication: Secondary | ICD-10-CM | POA: Insufficient documentation

## 2020-08-02 ENCOUNTER — Encounter: Payer: Self-pay | Admitting: Family Medicine

## 2020-08-05 ENCOUNTER — Encounter: Payer: Self-pay | Admitting: Internal Medicine

## 2020-08-08 ENCOUNTER — Other Ambulatory Visit: Payer: Self-pay | Admitting: Family Medicine

## 2020-08-08 DIAGNOSIS — E1169 Type 2 diabetes mellitus with other specified complication: Secondary | ICD-10-CM

## 2020-08-11 ENCOUNTER — Other Ambulatory Visit: Payer: Self-pay

## 2020-08-11 ENCOUNTER — Encounter: Payer: BC Managed Care – PPO | Attending: Internal Medicine | Admitting: Dietician

## 2020-08-11 ENCOUNTER — Encounter: Payer: Self-pay | Admitting: Dietician

## 2020-08-11 DIAGNOSIS — E1165 Type 2 diabetes mellitus with hyperglycemia: Secondary | ICD-10-CM | POA: Insufficient documentation

## 2020-08-11 NOTE — Progress Notes (Signed)
Patient is here today alone. Patient would like to learn more about diabetes to make long lasting changes. Last year she was in the hospital twice with glucose 350-600 (GI illness). She states that she has a hard time accepting help.  History includes:  Type 2 Diabetes, HTN, dyslipidemia, hypothyroidism, peripheral neuropathy A1C 10.5% 06/30/2020 11% 02/28/2020 and overall greater than 9.5% since 05/2017 Medications include Toujeo 24 untis daily (out due to insurance until 2 days ago), Novolog 14 units plus sliding scale based on blood glucose (CF 1/25 points above 130), Metformin  Dexcom CGM  TIR for the past 30 days 40% and 1% low.  There remainder was high or very high. Glucose reading "High" during visit.  Checked using blood glucose meter and glucose was 453.  Decreased to 290 by the end of the appointment.  She took 14 units of Novolog prior to lunch.  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  She sets a timer to remind her to get up.  She tracks her steps and gets between 4000-10000 steps on the weekend. She walks her 58 yo dog daily for 20 minutes.  She used to do [employer] but has not found a location currently.  She does calisthenics for the past 2 weeks. She has been on Weight Watchers for years. She has increased knee pain at times.  Lost a large amount of weight in the past, then moved to [place], kept the weight off for a time and regained 37 lbs during covid.  She has increased stress.  Her brother-in-law has Sarcoma.  She has lost 8 people within the past 8 years.  She is making an  appointment for a grief counselor.    Diabetes Self-Management Education  Visit Type: First/Initial  Appt. Start Time: 1415 Appt. End Time: 2458  08/14/2020  [name], identified by name and  date of birth, is a 55 y.o. female with a diagnosis of Diabetes: Type 2.   ASSESSMENT  Height 5\' 8"  (1.727 m), weight 287 lb (130.2 kg), last menstrual period 06/28/2015. Body mass index is 43.64 kg/m.   Diabetes Self-Management Education - 08/11/20 1458      Visit Information   Visit Type First/Initial      Initial Visit   Diabetes Type Type 2    Are you currently following a meal plan? No    Are you taking your medications as prescribed? Yes    Date Diagnosed 2010      Health Coping   How would you rate your overall health? Good      Psychosocial Assessment   Patient Belief/Attitude about Diabetes Motivated to manage diabetes   and frustrated   Self-care barriers None    Self-management support Doctor's office    Other persons present Patient    Patient Concerns Nutrition/Meal planning;Glycemic Control;Weight Control;Problem Solving;Healthy Lifestyle    Special Needs None    Preferred Learning Style No preference indicated    Learning Readiness Not Ready    How often do you need to have someone help you when you read instructions, pamphlets, or other written materials from your doctor or pharmacy? 1 - Never    What is the last grade level you completed in school? 4 years college  Pre-Education Assessment   Patient understands the diabetes disease and treatment process. Needs Review    Patient understands incorporating nutritional management into lifestyle. Needs Review    Patient undertands incorporating physical activity into lifestyle. Needs Review    Patient understands using medications safely. Needs Review    Patient understands monitoring blood glucose, interpreting and using results Needs Review    Patient understands prevention, detection, and treatment of acute complications. Needs Review    Patient understands prevention, detection, and treatment of chronic complications. Needs Review    Patient understands how to develop strategies to address psychosocial  issues. Needs Review    Patient understands how to develop strategies to promote health/change behavior. Needs Review      Complications   Last HgB A1C per patient/outside source 10.5 %   06/30/2020 decreased from 11% 03/09/2021   How often do you check your blood sugar? > 4 times/day   Dexcom G6   Fasting Blood glucose range (mg/dL) 70-129;130-179    Postprandial Blood glucose range (mg/dL) >200    Number of hypoglycemic episodes per month 5    Can you tell when your blood sugar is low? Yes   dexcom alarm   What do you do if your blood sugar is low? glucose tabs or PB crackers or fruit or juice    Number of hyperglycemic episodes per week 21    Can you tell when your blood sugar is high? Yes    What do you do if your blood sugar is high? drinks more water, moves more    Have you had a dilated eye exam in the past 12 months? Yes    Have you had a dental exam in the past 12 months? Yes    Are you checking your feet? Yes    How many days per week are you checking your feet? 7      Dietary Intake   Breakfast skips (many weekdays) OR tomatoes, eggs, spinach, cheese on the weekend    Snack (morning) none    Lunch [place], chicken burrito, sour cream, guacamole OR grilled chicken nuggets, side salad, lite [place] dressing, buffalo sauce OR salad OR Pho soup with seafood    Snack (afternoon) occasional fresh fruit, [place] granola bar or almond kind bar    Dinner preps dinner and freezes (chicken, LS chicken broth, carrots, onions and 1 slice sourdough OR salmon, wild rice, vegetable    Snack (evening) only if glucose is low    Beverage(s) water, flavored water/crystal lite, coffee with sugar free creamer, 1-2 alcohol occasionally      Exercise   Exercise Type Light (walking / raking leaves)    How many days per week to you exercise? 5    How many minutes per day do you exercise? 45    Total minutes per week of exercise 225      Patient Education   Previous Diabetes Education Yes  (please comment)   2010   Disease state  Definition of diabetes, type 1 and 2, and the diagnosis of diabetes    Nutrition management  Role of diet in the treatment of diabetes and the relationship between the three main macronutrients and blood glucose level;Food label reading, portion sizes and measuring food.;Carbohydrate counting;Meal options for control of blood glucose level and chronic complications.;Information on hints to eating out and maintain blood glucose control.;Effects of alcohol on blood glucose and safety factors with consumption of alcohol.;Meal timing in regards to the patients' current diabetes  medication.;Reviewed blood glucose goals for pre and post meals and how to evaluate the patients' food intake on their blood glucose level.    Physical activity and exercise  Role of exercise on diabetes management, blood pressure control and cardiac health.    Medications Reviewed patients medication for diabetes, action, purpose, timing of dose and side effects.    Monitoring Taught/discussed recording of test results and interpretation of SMBG.;Identified appropriate SMBG and/or A1C goals.;Yearly dilated eye exam;Daily foot exams    Acute complications Taught treatment of hypoglycemia - the 15 rule.;Discussed and identified patients' treatment of hyperglycemia.    Chronic complications Relationship between chronic complications and blood glucose control    Psychosocial adjustment Worked with patient to identify barriers to care and solutions;Role of stress on diabetes;Identified and addressed patients feelings and concerns about diabetes    Personal strategies to promote health Lifestyle issues that need to be addressed for better diabetes care      Individualized Goals (developed by patient)   Nutrition General guidelines for healthy choices and portions discussed    Physical Activity Exercise 5-7 days per week    Medications take my medication as prescribed    Monitoring  test my blood  glucose as discussed    Reducing Risk increase portions of healthy fats;examine blood glucose patterns;do foot checks daily    Health Coping discuss diabetes with (comment)      Post-Education Assessment   Patient understands the diabetes disease and treatment process. Demonstrates understanding / competency    Patient understands incorporating nutritional management into lifestyle. Needs Review    Patient undertands incorporating physical activity into lifestyle. Demonstrates understanding / competency    Patient understands using medications safely. Demonstrates understanding / competency    Patient understands monitoring blood glucose, interpreting and using results Demonstrates understanding / competency    Patient understands prevention, detection, and treatment of acute complications. Demonstrates understanding / competency    Patient understands prevention, detection, and treatment of chronic complications. Demonstrates understanding / competency    Patient understands how to develop strategies to address psychosocial issues. Needs Review    Patient understands how to develop strategies to promote health/change behavior. Needs Review      Outcomes   Expected Outcomes Demonstrated interest in learning. Expect positive outcomes    Future DMSE 2 months    Program Status Not Completed           Individualized Plan for Diabetes Self-Management Training:   Learning Objective:  Patient will have a greater understanding of diabetes self-management. Patient education plan is to attend individual and/or group sessions per assessed needs and concerns.   Plan:   Patient Instructions  [email or web address]  Stress control  Calorie king app Continue to read labels to help you with your choices when eating out.  Consistency of meals - begin counting carbohydrates  Mindfulness  Choices  Portions  Eat slowly  Stop when you are satisfied     Expected Outcomes:  Demonstrated interest in  learning. Expect positive outcomes  Education material provided: ADA - How to Thrive: A Guide for Your Journey with Diabetes, Food label handouts, Meal plan card, Snack sheet and Diabetes Resources, Eating out tips with diabetes  If problems or questions, patient to contact team via:  Phone  Future DSME appointment: 2 months

## 2020-08-11 NOTE — Patient Instructions (Addendum)
[email or web address]  Stress control  Calorie king app Continue to read labels to help you with your choices when eating out.  Consistency of meals - begin counting carbohydrates  Mindfulness  Choices  Portions  Eat slowly  Stop when you are satisfied

## 2020-09-22 ENCOUNTER — Other Ambulatory Visit: Payer: Self-pay | Admitting: Internal Medicine

## 2020-09-22 ENCOUNTER — Ambulatory Visit: Payer: BC Managed Care – PPO | Admitting: Podiatry

## 2020-09-22 ENCOUNTER — Encounter: Payer: Self-pay | Admitting: Podiatry

## 2020-09-22 ENCOUNTER — Other Ambulatory Visit: Payer: Self-pay

## 2020-09-22 VITALS — BP 107/73 | HR 93 | Resp 12

## 2020-09-22 DIAGNOSIS — L603 Nail dystrophy: Secondary | ICD-10-CM | POA: Diagnosis not present

## 2020-09-22 DIAGNOSIS — E119 Type 2 diabetes mellitus without complications: Secondary | ICD-10-CM | POA: Diagnosis not present

## 2020-09-23 ENCOUNTER — Other Ambulatory Visit: Payer: Self-pay | Admitting: Internal Medicine

## 2020-09-23 DIAGNOSIS — E1165 Type 2 diabetes mellitus with hyperglycemia: Secondary | ICD-10-CM

## 2020-09-29 ENCOUNTER — Ambulatory Visit: Payer: BC Managed Care – PPO | Admitting: Internal Medicine

## 2020-10-10 NOTE — Progress Notes (Signed)
   HPI: 55 y.o. female presenting today as a new patient for diabetic foot exam.  Patient does have a PMHx diabetes mellitus uncontrolled.  Last A1c was 10.5 on 06/30/2020.  Patient states that she is experiencing some mild neuropathy.  She presents today for further treatment and evaluation and for routine diabetic foot exam  Past Medical History:  Diagnosis Date  . Arthritis   . Diabetes mellitus without complication ([place])    type 2  . Thyroid disease      Physical Exam: General: The patient is alert and oriented x3 in no acute distress.  Dermatology: Skin is warm, dry and supple bilateral lower extremities. Negative for open lesions or macerations.  Transverse ridges noted to the bilateral great toenail plates.  There is some discoloration with thickening as well.  Vascular: Palpable pedal pulses bilaterally. No edema or erythema noted. Capillary refill within normal limits.  Neurological: Epicritic and protective threshold diminished bilaterally.   Musculoskeletal Exam: No pedal deformities noted  Assessment: 1.  Diabetes mellitus with peripheral polyneuropathy; uncontrolled 2.  Nail dystrophy bilateral great toenails   Plan of Care:  1. Patient evaluated. X-Rays reviewed.  2.  Comprehensive diabetic foot exam was performed today.  Stressed the importance of maintaining healthy blood glucose levels and continue management with PCP for diabetic management 3.  Recommend good foot hygiene 4.  Return to clinic annually      Edrick Kins, DPM [facility]  Dr. Edrick Kins, DPM    [address], [place] [address]                Office [phone]  Fax [phone]

## 2020-10-20 ENCOUNTER — Encounter: Payer: Self-pay | Admitting: Internal Medicine

## 2020-10-20 ENCOUNTER — Ambulatory Visit: Payer: BC Managed Care – PPO | Admitting: Internal Medicine

## 2020-10-20 ENCOUNTER — Other Ambulatory Visit: Payer: Self-pay

## 2020-10-20 VITALS — BP 126/82 | HR 94 | Ht 68.0 in | Wt 290.5 lb

## 2020-10-20 DIAGNOSIS — Z794 Long term (current) use of insulin: Secondary | ICD-10-CM | POA: Diagnosis not present

## 2020-10-20 DIAGNOSIS — E1165 Type 2 diabetes mellitus with hyperglycemia: Secondary | ICD-10-CM

## 2020-10-20 LAB — POCT GLYCOSYLATED HEMOGLOBIN (HGB A1C): Hemoglobin A1C: 10.8 % — AB (ref 4.0–5.6)

## 2020-10-20 MED ORDER — INSULIN ASPART 100 UNIT/ML CARTRIDGE (PENFILL): SUBCUTANEOUS | 6 refills | Status: DC

## 2020-10-20 MED ORDER — INPEN 100-PINK-NOVOLOG-FIASP DEVI: 1.0000 | Freq: Once | 0 refills | Status: DC

## 2020-10-20 NOTE — Progress Notes (Signed)
Name: Tanya Graves  Age/ Sex: 55 y.o., female   MRN/ DOB: [id number], 20-Jan-1966     PCP: [name], MD   Reason for Endocrinology Evaluation: Type 2 Diabetes Mellitus  Initial Endocrine Consultative Visit: 07/08/2018    PATIENT IDENTIFIER: Tanya Graves is a 55 y.o. female with a past medical history of T2DM, HTN, Dyslipidemia and hypothyroidism . The patient has followed with Endocrinology clinic since 07/08/2018 for consultative assistance with management of her diabetes.  DIABETIC HISTORY:  [name] was diagnosed with T2DM ~ 2010.  She does not recall intolerance to other oral glycemic agents.  On her initial visit here she was on MDI regimen of insulin and metformin. Her hemoglobin A1c has ranged from 11.4%  in 2016, peaking at 12.5% in 2019  GLP-1 and SGLT-2 inhibitors have been cost prohibitive    Thyroid Disease  She is S/P RAI ablation years ago > 20 yrs ago and has been on levothyroxine . She is on Levothyroxine 175 mcg daily   SUBJECTIVE:   During the last visit (06/30/2020): A1c 10.5% . We continued Metformin, and adjusted MDI regime     Today (10/20/2020): [name] for a follow-up on diabetes management.  She checks her blood sugars 4 times daily, preprandial and bedtime. The patient has had hypoglycemic episodes since the last clinic visit.   Brother in- law with recent dx of sarcoma has to have a left leg amputation   Saw podiatry and now   She is on Prednisone eye drops for almost a month for iritis  She is c/p right foot localized pain- follows with podiatry    HOME DIABETES REGIMEN:  Metformin 1000 mg twice daily with meals Toujeo 24 units daily   Novolog  14 units TIDQAC    CONTINUOUS GLUCOSE MONITORING RECORD INTERPRETATION    Dates of Recording: 5/14-5/27/2022  Sensor description: Dexcom  Results statistics:   CGM use % of time 93  Average and SD 257/104  Time in range   29  %  % Time Above 180 16  % Time above 250 54  %  Time Below target <1    Glycemic patterns summary: Hyperglycemia during the day and night  With the occasional hypoglycemia over night   Hyperglycemic episodes all day  (postprandial)  Hypoglycemic episodes occurred overnight   DIABETIC COMPLICATIONS: Microvascular complications:   Neuropathy , worsening DR  Denies: CKD   Last eye exam: Completed 07/17/2020  Macrovascular complications:    Denies: CAD, PVD, CVA    HISTORY:  Past Medical History:  Past Medical History:  Diagnosis Date  . Arthritis   . Diabetes mellitus without complication ([place])    type 2  . Thyroid disease    Past Surgical History:  Past Surgical History:  Procedure Laterality Date  . APPENDECTOMY  1993  . CALCANEAL OSTEOTOMY Left 07/17/2016   Procedure: CALCANEAL OSTEOTOMY;  Surgeon: [name], MD;  Location: [facility] ORS;  Service: Orthopedics;  Laterality: Left;  . COLONOSCOPY  12/18/2018   found 1 55mm polyp, going back in 5 years   . COLONOSCOPY WITH PROPOFOL N/A 12/18/2018   Procedure: COLONOSCOPY WITH PROPOFOL;  Surgeon: [name], MD;  Location: [facility];  Service: Gastroenterology;  Laterality: N/A;  . IRRIGATION AND DEBRIDEMENT ABSCESS N/A 01/24/2015   Procedure: IRRIGATION AND DEBRIDEMENT ABSCESS/GROIN ABSCESS;  Surgeon: [name], MD;  Location: [facility] ORS;  Service: General;  Laterality: N/A;  . KNEE ARTHROSCOPY W/ MENISCAL REPAIR Right 2008  .  ROTATOR CUFF REPAIR Right 2015  . TONSILLECTOMY  1998    Social History:  reports that she has been smoking cigarettes. She has never used smokeless tobacco. She reports current alcohol use. She reports that she does not use drugs. Family History:  Family History  Problem Relation Age of Onset  . Diabetes Father   . Heart failure Father   . Diabetes Brother   . Arthritis Mother   . Diabetes Mother   . Transient ischemic attack Mother   . Heart failure Maternal Grandmother   . Alcohol abuse Maternal Grandmother   .  Diabetes Paternal Grandmother   . Kidney failure Paternal Grandmother   . Heart failure Paternal Grandfather   . Cancer Paternal Grandfather        unknown type  . Hypertension Brother   . Breast cancer Maternal Great-grandmother     OBJECTIVE:   Vital Signs: BP 126/82   Pulse 94   Ht 5\' 8"  (1.727 m)   Wt 290 lb 8 oz (131.8 kg)   LMP 06/28/2015 (Approximate)   SpO2 98%   BMI 44.17 kg/m   Wt Readings from Last 3 Encounters:  10/20/20 290 lb 8 oz (131.8 kg)  08/11/20 287 lb (130.2 kg)  07/11/20 289 lb (131.1 kg)     Exam: General: Pt appears well and is in NAD  Lungs: Clear with good BS bilat   Heart: RRR   Extremities: No pretibial edema.   Neuro: MS is good with appropriate affect, pt is alert and Ox3   DM foot exam: 02/28/2020 The skin of the feet is intact without sores or ulcerations. The pedal pulses are 2+ on right and 2+ on left. The sensation is intact to a screening 5.07, 10 gram monofilament bilaterally     DATA REVIEWED:  Lab Results  Component Value Date   HGBA1C 10.8 (A) 10/20/2020   HGBA1C 10.5 (A) 06/30/2020   HGBA1C 11.0 (A) 02/28/2020   Lab Results  Component Value Date   MICROALBUR 4.7 (H) 05/02/2020   LDLCALC 82 05/02/2020   CREATININE 0.75 05/02/2020   Lab Results  Component Value Date   MICRALBCREAT 2.7 05/02/2020     Lab Results  Component Value Date   CHOL 159 05/02/2020   HDL 59.40 05/02/2020   LDLCALC 82 05/02/2020   LDLDIRECT 88.0 03/25/2019   TRIG 88.0 05/02/2020   CHOLHDL 3 05/02/2020         ASSESSMENT / PLAN / RECOMMENDATIONS:  1)Type 2 Diabetes Mellitus, Poorly controlled, With Neuropathic and retinopathic complications - Most recent A1c of 10.8 %. Goal A1c < 7.0 %.   - She continuous with hyperglycemia, she has fear of hypoglycemia which limits optimizing her Bg's  - She has been on steroid eye drops , she believes this is affecting glucose readigs - In office BG 247 mg/dL , she ate a snack (apple/gold fish)  without prandial coverage.  - she also admits to taking prandial insulin after meals at times - We discussed weight loss sx , I am going to try InPen, as pumps are not covered by pt   MEDICATIONS:  Continue metformin 1000 mg twice daily   Decrease Toujeo 22 units daily   Change  NovoLog  16/16/10- 14  units   Continue  CF : Novolog ( BG-130/25)     EDUCATION / INSTRUCTIONS:  BG monitoring instructions: Patient is instructed to check her blood sugars 4 times a day, before meals and bedtime.  Call [facility] Endocrin[facility] clinic  if: BG persistently < 70  . I reviewed the Rule of 15 for the treatment of hypoglycemia in detail with the patient. Literature supplied.  2) Post ablative hypothyroidism:  -Repeat TSH is normal in 02/2020 -No local neck symptoms - Last TSh was normal   Medication Continue levothyroxine 175 MCG daily  3) BMI 44:   - She is hesitant about weight loss sx. Discussed gastric sleeve due to less invasive and less complication then Roux-en -Y    4) Right foot pain    - I do believe this is tendinitis related to tight shoe straps, pt will avoid that shoe and will apply ice.     I spent 25 minutes preparing to see the patient by review of recent labs, imaging and procedures, obtaining and reviewing separately obtained history, communicating with the patient/family or caregiver, ordering medications, tests or procedures, and documenting clinical information in the EHR including the differential Dx, treatment, and any further evaluation and other management     F/U in 4 months     Signed electronically by: Mack Guise, MD  [facility]  [address] [place], [place] [address] Phone: [phone] FAX: [phone]   CC: [name], [place] [address] Phone: [phone]  Fax: [phone]  Return to Endocrinology clinic as below: Future Appointments  Date  Time Provider [place]  01/26/2021  2:00 PM Ninamarie Keel, Melanie Crazier, MD [facility] None

## 2020-10-20 NOTE — Patient Instructions (Addendum)
-   Decrease  Toujeo 22  units daily  - Novolog 16 units with Breakfast,16 units with  Lunch and 10-14 units with supper   - Metformin 1000 mg Twice a day  - Novolog correctional insulin: ADD extra units on insulin to your meal-time Novolog dose if your blood sugars are higher than 155 . Use the scale below to help guide you:   Blood sugar before meal Number of units to inject  Less than 155 0 unit  156 -  180 1 units  181 -  205 2 units  206 -  230 3 units  231 -  255 4 units  256 -  280 5 units  281 -  305 6 units  306 -  330 7 units  331 -  355 8 units  356 - 380 9 units          HOW TO TREAT LOW BLOOD SUGARS (Blood sugar LESS THAN 70 MG/DL)  Please follow the RULE OF 15 for the treatment of hypoglycemia treatment (when your (blood sugars are less than 70 mg/dL)    STEP 1: Take 15 grams of carbohydrates when your blood sugar is low, which includes:   3-4 GLUCOSE TABS  OR  3-4 OZ OF JUICE OR REGULAR SODA OR  ONE TUBE OF GLUCOSE GEL     STEP 2: RECHECK blood sugar in 15 MINUTES STEP 3: If your blood sugar is still low at the 15 minute recheck --> then, go back to STEP 1 and treat AGAIN with another 15 grams of carbohydrates.    Look Up Gastric sleeve sx

## 2020-11-01 ENCOUNTER — Other Ambulatory Visit: Payer: Self-pay | Admitting: Internal Medicine

## 2020-11-01 DIAGNOSIS — E1165 Type 2 diabetes mellitus with hyperglycemia: Secondary | ICD-10-CM

## 2020-11-02 ENCOUNTER — Telehealth: Payer: BC Managed Care – PPO | Admitting: Physician Assistant

## 2020-11-02 ENCOUNTER — Encounter: Payer: Self-pay | Admitting: Physician Assistant

## 2020-11-02 ENCOUNTER — Other Ambulatory Visit: Payer: Self-pay

## 2020-11-02 ENCOUNTER — Encounter: Payer: Self-pay | Admitting: Internal Medicine

## 2020-11-02 DIAGNOSIS — S20211A Contusion of right front wall of thorax, initial encounter: Secondary | ICD-10-CM

## 2020-11-02 DIAGNOSIS — E1165 Type 2 diabetes mellitus with hyperglycemia: Secondary | ICD-10-CM

## 2020-11-02 MED ORDER — CYCLOBENZAPRINE HCL 10 MG PO TABS: 10.0000 mg | ORAL_TABLET | Freq: Three times a day (TID) | ORAL | 0 refills | Status: DC | PRN

## 2020-11-02 NOTE — Patient Instructions (Signed)
Rib Contusion A rib contusion is a deep bruise on the rib area. Contusions are the result of a blunt trauma that causes bleeding and injury to the tissues under the skin. A rib contusion may involve bruising of the ribs and of the skin and muscles in the area. The skin over the contusion may turn blue, purple, or yellow. Minor injuries result in a painless contusion. More severe contusions may be painful and swollen for a few weeks. What are the causes? This condition is usually caused by a hard, direct hit to an area of the body. This often occurs while playing contact sports. What are the signs or symptoms? Symptoms of this condition include: Swelling and redness of the injured area. Discoloration of the injured area. Tenderness and soreness of the injured area. Pain with or without movement. Pain when breathing in. How is this diagnosed? This condition may be diagnosed based on: Your symptoms and medical history. A physical exam. Imaging tests--such as an X-ray, CT scan, or MRI--to determine if there were internal injuries or broken bones (fractures). How is this treated? This condition may be treated with: Rest. This is often the best treatment for a rib contusion. Ice packs. This reduces swelling and inflammation. Deep-breathing exercises. These may be recommended to reduce the risk for lung collapse and pneumonia. Medicines. Over-the-counter or prescription medicines may be given to control pain. Injection of a numbing medicine around the nerve near your injury (nerve block). Follow these instructions at home: Medicines Take over-the-counter and prescription medicines only as told by your health care provider. Ask your health care provider if the medicine prescribed to you: Requires you to avoid driving or using machinery. Can cause constipation. You may need to take these actions to prevent or treat constipation: Drink enough fluid to keep your urine pale yellow. Take  over-the-counter or prescription medicines. Eat foods that are high in fiber, such as beans, whole grains, and fresh fruits and vegetables. Limit foods that are high in fat and processed sugars, such as fried or sweet foods. Managing pain, stiffness, and swelling If directed, put ice on the injured area. To do this: Put ice in a plastic bag. Place a towel between your skin and the bag. Leave the ice on for 20 minutes, 2-3 times a day. Remove the ice if your skin turns bright red. This is very important. If you cannot feel pain, heat, or cold, you have a greater risk of damage to the area.   Activity Rest the injured area. Avoid strenuous activity and any activities or movements that cause pain. Be careful during activities, and avoid bumping the injured area. Do not lift anything that is heavier than 5 lb (2.3 kg), or the limit that you are told, until your health care provider says that it is safe. General instructions Do not use any products that contain nicotine or tobacco, such as cigarettes, e-cigarettes, and chewing tobacco. These can delay healing. If you need help quitting, ask your health care provider. Do deep-breathing exercises as told by your health care provider. If you were given an incentive spirometer, use it every 1-2 hours while you are awake, or as recommended by your health care provider. This device measures how well you are filling your lungs with each breath. Keep all follow-up visits. This is important.   Contact a health care provider if you have: Increased bruising or swelling. Pain that is not controlled with treatment. A fever. Get help right away if you: Have difficulty  breathing or shortness of breath. Develop a continual cough, or you cough up thick or bloody mucus from your lungs (sputum). Feel nauseous or you vomit. Have pain in your abdomen. These symptoms may represent a serious problem that is an emergency. Do not wait to see if the symptoms will go  away. Get medical help right away. Call your local emergency services (911 in the [place] Do not drive yourself to the hospital. Summary A rib contusion is a deep bruise on your rib area. Contusions are the result of a blunt trauma that causes bleeding and injury to the tissues under the skin. The skin over the contusion may turn blue, purple, or yellow. Minor injuries may cause a painless contusion. More severe contusions may be painful and swollen for a few weeks. Rest the injured area. Avoid strenuous activity and any activities or movements that cause pain. This information is not intended to replace advice given to you by your health care provider. Make sure you discuss any questions you have with your health care provider. Document Revised: 08/18/2019 Document Reviewed: 08/18/2019 Elsevier Patient Education  [place].

## 2020-11-02 NOTE — Progress Notes (Signed)
[name] are scheduled for a virtual visit with your provider today.    Just as we do with appointments in the office, we must obtain your consent to participate.  Your consent will be active for this visit and any virtual visit you may have with one of our providers in the next 365 days.    If you have a MyChart account, I can also send a copy of this consent to you electronically.  All virtual visits are billed to your insurance company just like a traditional visit in the office.  As this is a virtual visit, video technology does not allow for your provider to perform a traditional examination.  This may limit your provider's ability to fully assess your condition.  If your provider identifies any concerns that need to be evaluated in person or the need to arrange testing such as labs, EKG, etc, we will make arrangements to do so.    Although advances in technology are sophisticated, we cannot ensure that it will always work on either your end or our end.  If the connection with a video visit is poor, we may have to switch to a telephone visit.  With either a video or telephone visit, we are not always able to ensure that we have a secure connection.   I need to obtain your verbal consent now.   Are you willing to proceed with your visit today?   Tanya Graves has provided verbal consent on 11/02/2020 for a virtual visit (video or telephone).   Mar Daring, PA-C 11/02/2020  1:44 PM    MyChart Video Visit    Virtual Visit via Video Note   This visit type was conducted due to national recommendations for restrictions regarding the COVID-19 Pandemic (e.g. social distancing) in an effort to limit this patient's exposure and mitigate transmission in our community. This patient is at least at moderate risk for complications without adequate follow up. This format is felt to be most appropriate for this patient at this time. Physical exam was limited by quality of the video and audio  technology used for the visit.   Patient location: Work, isolated in [place] location: Home office in [place]  I discussed the limitations of evaluation and management by telemedicine and the availability of in person appointments. The patient expressed understanding and agreed to proceed.  Patient: Tanya Graves   DOB: July 02, 1965   55 y.o. Female  MRN: [id number] Visit Date: 11/02/2020  Today's healthcare provider: Mar Daring, PA-C   No chief complaint on file.  Subjective    HPI  Tanya Graves is a 55 yr old female that presents today via [place] for right rib pain. She was working on her commode on Sunday, 10/29/20, and was reaching around trying to wrench something on. She was leaning in and pressing on the commode with the right ribs and heard a popping sensation and had immediate discomfort. Since she has continued to have pain that now radiates towards the back. She has muscle tightness and tenderness. She does have movement pain and pain with deep breathing/coughing/sneezing/laughing. Denies any obvious deformity or shortness of breath, difficulty breathing. She has been taking Ibuprofen, tylenol and motrin without complete relief. She has also been trying to stretch and using ice and heat.   Patient Active Problem List   Diagnosis Date Noted   Type 2 diabetes mellitus with ophthalmic complication ([place]) 95/62/1308   Type 2 diabetes mellitus with hyperglycemia, with long-term  current use of insulin ([place]) 06/30/2020   Left-sided chest wall pain 12/17/2019   Type 2 diabetes mellitus with diabetic polyneuropathy, with long-term current use of insulin ([place]) 09/10/2018   Tobacco use 07/22/2018   Hypothyroidism 07/22/2018   Hyperlipidemia associated with type 2 diabetes mellitus ([place]) 07/22/2018   Poorly controlled type 2 diabetes mellitus ([place]) 01/22/2015   Past Medical History:  Diagnosis Date   Arthritis    Diabetes mellitus without complication ([place])     type 2   Thyroid disease       Medications: Outpatient Medications Prior to Visit  Medication Sig   albuterol (VENTOLIN HFA) 108 (90 Base) MCG/ACT inhaler Inhale 2 puffs into the lungs every 6 (six) hours as needed for wheezing or shortness of breath.   atorvastatin (LIPITOR) 20 MG tablet Take 1 tablet (20 mg total) by mouth daily.   conjugated estrogens (PREMARIN) vaginal cream INSERT 1 GRAM VAGINALLY  NIGHTLY FOR 2 WEEKS THEN 1 GRAM EVERY OTHER NIGHT FOR 2 WEEKS THEN 1 GRAM TWO NIGHTS A WEEK THEREAFTER (Patient taking differently: INSERT 1 GRAM VAGINALLY  NIGHTLY FOR 2 WEEKS THEN 1 GRAM EVERY OTHER NIGHT FOR 2 WEEKS THEN 1 GRAM TWO NIGHTS A WEEK THEREAFTER)   Continuous Blood Gluc Receiver (DEXCOM G6 RECEIVER) DEVI Dexcom G6 Receiver misc  USE UTD   Continuous Blood Gluc Sensor (DEXCOM G6 SENSOR) MISC APPLY EVERY 10 DAYS AS DIRECTED   Continuous Blood Gluc Transmit (DEXCOM G6 TRANSMITTER) MISC USE AS DIRECTED FOR CONTINUOUS BLOOD GLUCOSE MONITORING. CHANGE EVERY 3 MONTHS   glucose blood (CONTOUR NEXT TEST) test strip 4 times a day   insulin aspart (NOVOLOG) cartridge Max daily 80 units   Insulin Pen Needle (BD PEN NEEDLE MICRO U/F) 32G X 6 MM MISC 4x a day   levothyroxine (SYNTHROID) 175 MCG tablet Take 1 tablet (175 mcg total) by mouth daily before breakfast.   metFORMIN (GLUCOPHAGE) 1000 MG tablet Take 1 tablet (1,000 mg total) by mouth 2 (two) times daily with a meal.   pantoprazole (PROTONIX) 40 MG tablet Take 1 tablet (40 mg total) by mouth 2 (two) times daily. (Patient not taking: Reported on 10/20/2020)   PREDNISOLONE ACETATE P-F 1 % ophthalmic suspension Place 1 drop into the left eye 4 (four) times daily.   TOUJEO MAX SOLOSTAR 300 UNIT/ML Solostar Pen INJECT 36 UNITS UNDER THE SKIN DAILY   No facility-administered medications prior to visit.    Review of Systems  Constitutional: Negative.   Respiratory: Negative.    Cardiovascular:  Positive for chest pain (over right lower  ribs).   Last CBC Lab Results  Component Value Date   WBC 8.0 06/01/2018   HGB 12.5 06/01/2018   HCT 35.7 (L) 06/01/2018   MCV 92.0 06/01/2018   MCH 32.2 06/01/2018   RDW 12.3 06/01/2018   PLT 165 40/98/1191   Last metabolic panel Lab Results  Component Value Date   GLUCOSE 182 (H) 05/02/2020   NA 139 05/02/2020   K 4.4 05/02/2020   CL 103 05/02/2020   CO2 30 05/02/2020   BUN 15 05/02/2020   CREATININE 0.75 05/02/2020   GFRNONAA >60 06/01/2018   GFRAA >60 06/01/2018   CALCIUM 9.1 05/02/2020   PROT 6.8 05/02/2020   ALBUMIN 4.2 05/02/2020   BILITOT 0.5 05/02/2020   ALKPHOS 73 05/02/2020   AST 13 05/02/2020   ALT 10 05/02/2020   ANIONGAP 6 06/01/2018      Objective    LMP 06/28/2015 (Approximate)  BP Readings  from Last 3 Encounters:  10/20/20 126/82  09/22/20 107/73  07/11/20 126/72   Wt Readings from Last 3 Encounters:  10/20/20 290 lb 8 oz (131.8 kg)  08/11/20 287 lb (130.2 kg)  07/11/20 289 lb (131.1 kg)      Physical Exam Vitals reviewed.  Constitutional:      General: She is not in acute distress.    Appearance: Normal appearance. She is well-developed. She is not ill-appearing.  HENT:     Head: Normocephalic and atraumatic.  Pulmonary:     Effort: Pulmonary effort is normal. No respiratory distress.  Musculoskeletal:     Cervical back: Normal range of motion and neck supple.     Comments: Patient reports tenderness to palpation over right lower ribs, just below right breast, that radiates to the thoracic spine. Tenderness to muscles with movement and stretching  Neurological:     Mental Status: She is alert.  Psychiatric:        Mood and Affect: Mood normal.        Behavior: Behavior normal.        Thought Content: Thought content normal.        Judgment: Judgment normal.       Assessment & Plan     1. Rib contusion, right, initial encounter - Worsening - Suspect rib contusion vs possible costochondral separation - Will continue  conservative management with NSAID of choice, tylenol and flexeril as below - Advised if not improving by Monday, 11/06/20, she should seek in-person evaluation - cyclobenzaprine (FLEXERIL) 10 MG tablet; Take 1 tablet (10 mg total) by mouth 3 (three) times daily as needed for muscle spasms.  Dispense: 30 tablet; Refill: 0   No follow-ups on file.     I discussed the assessment and treatment plan with the patient. The patient was provided an opportunity to ask questions and all were answered. The patient agreed with the plan and demonstrated an understanding of the instructions.   The patient was advised to call back or seek an in-person evaluation if the symptoms worsen or if the condition fails to improve as anticipated.  I provided 17 minutes of face-to-face time during this encounter via MyChart Video enabled encounter.   [place] [employer] [phone] (phone) [phone] (fax)  [place]

## 2020-11-03 NOTE — Addendum Note (Signed)
Addended by: Dorita Sciara on: 11/03/2020 09:36 AM   Modules accepted: Orders

## 2020-12-08 ENCOUNTER — Emergency Department
Admission: EM | Admit: 2020-12-08 | Discharge: 2020-12-08 | Disposition: A | Discharge status: Home / Self Care | Payer: BC Managed Care – PPO | Attending: Emergency Medicine | Admitting: Emergency Medicine

## 2020-12-08 ENCOUNTER — Other Ambulatory Visit: Payer: Self-pay

## 2020-12-08 ENCOUNTER — Other Ambulatory Visit: Payer: Self-pay | Admitting: Internal Medicine

## 2020-12-08 ENCOUNTER — Encounter: Payer: Self-pay | Admitting: Emergency Medicine

## 2020-12-08 DIAGNOSIS — E039 Hypothyroidism, unspecified: Secondary | ICD-10-CM | POA: Diagnosis not present

## 2020-12-08 DIAGNOSIS — E1142 Type 2 diabetes mellitus with diabetic polyneuropathy: Secondary | ICD-10-CM | POA: Insufficient documentation

## 2020-12-08 DIAGNOSIS — E1165 Type 2 diabetes mellitus with hyperglycemia: Secondary | ICD-10-CM | POA: Diagnosis not present

## 2020-12-08 DIAGNOSIS — R739 Hyperglycemia, unspecified: Secondary | ICD-10-CM | POA: Diagnosis present

## 2020-12-08 DIAGNOSIS — Z7984 Long term (current) use of oral hypoglycemic drugs: Secondary | ICD-10-CM | POA: Diagnosis not present

## 2020-12-08 DIAGNOSIS — Z794 Long term (current) use of insulin: Secondary | ICD-10-CM | POA: Diagnosis not present

## 2020-12-08 DIAGNOSIS — F17210 Nicotine dependence, cigarettes, uncomplicated: Secondary | ICD-10-CM | POA: Diagnosis not present

## 2020-12-08 DIAGNOSIS — Z79899 Other long term (current) drug therapy: Secondary | ICD-10-CM | POA: Insufficient documentation

## 2020-12-08 LAB — CBC WITH DIFFERENTIAL/PLATELET
Abs Immature Granulocytes: 0.05 10*3/uL (ref 0.00–0.07)
Basophils Absolute: 0.1 10*3/uL (ref 0.0–0.1)
Basophils Relative: 1 %
Eosinophils Absolute: 0.1 10*3/uL (ref 0.0–0.5)
Eosinophils Relative: 2 %
HCT: 40.5 % (ref 36.0–46.0)
Hemoglobin: 13.7 g/dL (ref 12.0–15.0)
Immature Granulocytes: 1 %
Lymphocytes Relative: 31 %
Lymphs Abs: 2.7 10*3/uL (ref 0.7–4.0)
MCH: 32.5 pg (ref 26.0–34.0)
MCHC: 33.8 g/dL (ref 30.0–36.0)
MCV: 96.2 fL (ref 80.0–100.0)
Monocytes Absolute: 0.5 10*3/uL (ref 0.1–1.0)
Monocytes Relative: 6 %
Neutro Abs: 5.4 10*3/uL (ref 1.7–7.7)
Neutrophils Relative %: 59 %
Platelets: 236 10*3/uL (ref 150–400)
RBC: 4.21 MIL/uL (ref 3.87–5.11)
RDW: 12 % (ref 11.5–15.5)
WBC: 8.8 10*3/uL (ref 4.0–10.5)
nRBC: 0 % (ref 0.0–0.2)

## 2020-12-08 LAB — URINALYSIS, COMPLETE (UACMP) WITH MICROSCOPIC
Bacteria, UA: NONE SEEN
Bilirubin Urine: NEGATIVE
Glucose, UA: >=500 mg/dL — AB
Hgb urine dipstick: NEGATIVE
Ketones, ur: 5 mg/dL — AB
Leukocytes,Ua: NEGATIVE
Nitrite: NEGATIVE
Protein, ur: NEGATIVE mg/dL
Specific Gravity, Urine: 1.024 (ref 1.005–1.030)
WBC, UA: NONE SEEN WBC/hpf (ref 0–5)
pH: 5 (ref 5.0–8.0)

## 2020-12-08 LAB — COMPREHENSIVE METABOLIC PANEL
ALT: 24 U/L (ref 0–44)
AST: 23 U/L (ref 15–41)
Albumin: 4 g/dL (ref 3.5–5.0)
Alkaline Phosphatase: 87 U/L (ref 38–126)
Anion gap: 8 (ref 5–15)
BUN: 22 mg/dL — ABNORMAL HIGH (ref 6–20)
CO2: 25 mmol/L (ref 22–32)
Calcium: 9.5 mg/dL (ref 8.9–10.3)
Chloride: 98 mmol/L (ref 98–111)
Creatinine, Ser: 0.94 mg/dL (ref 0.44–1.00)
GFR, Estimated: >60 mL/min (ref >60)
Glucose, Bld: 492 mg/dL — ABNORMAL HIGH (ref 70–99)
Potassium: 4.7 mmol/L (ref 3.5–5.1)
Sodium: 131 mmol/L — ABNORMAL LOW (ref 135–145)
Total Bilirubin: 0.8 mg/dL (ref 0.3–1.2)
Total Protein: 7.1 g/dL (ref 6.5–8.1)

## 2020-12-08 LAB — CBG MONITORING, ED
Glucose-Capillary: 440 mg/dL — ABNORMAL HIGH (ref 70–99)
Glucose-Capillary: 476 mg/dL — ABNORMAL HIGH (ref 70–99)

## 2020-12-08 MED ORDER — INSULIN ASPART 100 UNIT/ML IJ SOLN
10.0000 [IU] | Freq: Once | INTRAMUSCULAR | Status: AC
  Administered 2020-12-08: 10 [IU] via INTRAVENOUS
  Filled 2020-12-08: qty 1

## 2020-12-08 MED ORDER — SODIUM CHLORIDE 0.9 % IV BOLUS
1000.0000 mL | Freq: Once | INTRAVENOUS | Status: AC
  Administered 2020-12-08: 1000 mL via INTRAVENOUS

## 2020-12-08 NOTE — ED Triage Notes (Signed)
Patient ambulatory to triage with steady gait, without difficulty or distress noted; pt reports that her insulin pen broke tonight and has missed a ds; her FSBS at home was >400; c/o feeling thirsty and having leg cramping

## 2020-12-08 NOTE — Discharge Instructions (Addendum)
Return to the ER for recurrent or worsening symptoms, persistent vomiting, difficulty breathing or other concerns.

## 2020-12-08 NOTE — ED Provider Notes (Signed)
[facility] Emergency Department Provider Note   ____________________________________________   Event Date/Time   First MD Initiated Contact with Patient 12/08/20 0120     (approximate)  I have reviewed the triage vital signs and the nursing notes.   HISTORY  Chief Complaint Hyperglycemia    HPI Tanya Graves is a 55 y.o. female who presents to the ED from home with a chief complaint of hyperglycemia.  Patient has a history of type 2 diabetes, poorly controlled, whose insulin pen was broken tonight so she missed her evening dose.  She called her endocrinologist office who referred her to the ED for evaluation and treatment.  Patient was feeling thirsty and having leg cramping which is similar to what she feels when her blood sugar is high.  Symptoms have improved.  Denies cough, chest pain, shortness of breath, abdominal pain, nausea, vomiting or diarrhea.     Past Medical History:  Diagnosis Date   Arthritis    Diabetes mellitus without complication ([place])    type 2   Thyroid disease     Patient Active Problem List   Diagnosis Date Noted   Type 2 diabetes mellitus with ophthalmic complication ([place]) 17/79/3903   Type 2 diabetes mellitus with hyperglycemia, with long-term current use of insulin ([place]) 06/30/2020   Left-sided chest wall pain 12/17/2019   Type 2 diabetes mellitus with diabetic polyneuropathy, with long-term current use of insulin ([place]) 09/10/2018   Tobacco use 07/22/2018   Hypothyroidism 07/22/2018   Hyperlipidemia associated with type 2 diabetes mellitus ([place]) 07/22/2018   Poorly controlled type 2 diabetes mellitus ([place]) 01/22/2015    Past Surgical History:  Procedure Laterality Date   APPENDECTOMY  1993   CALCANEAL OSTEOTOMY Left 07/17/2016   Procedure: CALCANEAL OSTEOTOMY;  Surgeon: [name], MD;  Location: [facility] ORS;  Service: Orthopedics;  Laterality: Left;   COLONOSCOPY  12/18/2018   found 1 67mm polyp, going back in 5  years    COLONOSCOPY WITH PROPOFOL N/A 12/18/2018   Procedure: COLONOSCOPY WITH PROPOFOL;  Surgeon: [name], MD;  Location: [facility] ENDOSCOPY;  Service: Gastroenterology;  Laterality: N/A;   IRRIGATION AND DEBRIDEMENT ABSCESS N/A 01/24/2015   Procedure: IRRIGATION AND DEBRIDEMENT ABSCESS/GROIN ABSCESS;  Surgeon: [name], MD;  Location: [facility] ORS;  Service: General;  Laterality: N/A;   KNEE ARTHROSCOPY W/ MENISCAL REPAIR Right 2008   ROTATOR CUFF REPAIR Right 2015   TONSILLECTOMY  1998    Prior to Admission medications   Medication Sig Start Date End Date Taking? Authorizing Provider  albuterol (VENTOLIN HFA) 108 (90 Base) MCG/ACT inhaler Inhale 2 puffs into the lungs every 6 (six) hours as needed for wheezing or shortness of breath. 06/19/20   Copland, Frederico Hamman, MD  atorvastatin (LIPITOR) 20 MG tablet Take 1 tablet (20 mg total) by mouth daily. 05/02/20   [name], MD  conjugated estrogens (PREMARIN) vaginal cream INSERT 1 GRAM VAGINALLY  NIGHTLY FOR 2 WEEKS THEN 1 GRAM EVERY OTHER NIGHT FOR 2 WEEKS THEN 1 GRAM TWO NIGHTS A WEEK THEREAFTER Patient taking differently: INSERT 1 GRAM VAGINALLY  NIGHTLY FOR 2 WEEKS THEN 1 GRAM EVERY OTHER NIGHT FOR 2 WEEKS THEN 1 GRAM TWO NIGHTS A WEEK THEREAFTER 07/23/19   [name], MD  Continuous Blood Gluc Receiver (DEXCOM G6 RECEIVER) DEVI Dexcom G6 Receiver misc  USE UTD    [provider]  Continuous Blood Gluc Sensor (DEXCOM G6 SENSOR) MISC APPLY EVERY 10 DAYS AS DIRECTED 09/25/20   Shamleffer, Mammie Lorenzo  Amedeo Kinsman, MD  Continuous Blood Gluc Transmit (DEXCOM G6 TRANSMITTER) MISC USE AS DIRECTED FOR CONTINUOUS BLOOD GLUCOSE MONITORING. CHANGE EVERY 3 MONTHS 11/01/20   Shamleffer, Melanie Crazier, MD  cyclobenzaprine (FLEXERIL) 10 MG tablet Take 1 tablet (10 mg total) by mouth 3 (three) times daily as needed for muscle spasms. 11/02/20   Mar Daring, PA-C  glucose blood (CONTOUR NEXT TEST) test strip 4 times a day 07/08/18    Shamleffer, Melanie Crazier, MD  insulin aspart (NOVOLOG) cartridge Max daily 80 units 10/20/20   Shamleffer, Melanie Crazier, MD  Insulin Pen Needle (BD PEN NEEDLE MICRO U/F) 32G X 6 MM MISC 4x a day 07/08/18   Shamleffer, Melanie Crazier, MD  levothyroxine (SYNTHROID) 175 MCG tablet Take 1 tablet (175 mcg total) by mouth daily before breakfast. 02/29/20   Shamleffer, Melanie Crazier, MD  metFORMIN (GLUCOPHAGE) 1000 MG tablet Take 1 tablet (1,000 mg total) by mouth 2 (two) times daily with a meal. 02/29/20   Shamleffer, Melanie Crazier, MD  pantoprazole (PROTONIX) 40 MG tablet Take 1 tablet (40 mg total) by mouth 2 (two) times daily. Patient not taking: Reported on 10/20/2020 03/25/19   [name], MD  PREDNISOLONE ACETATE P-F 1 % ophthalmic suspension Place 1 drop into the left eye 4 (four) times daily. 10/04/20   [provider]  TOUJEO MAX SOLOSTAR 300 UNIT/ML Solostar Pen INJECT 36 UNITS UNDER THE SKIN DAILY 09/22/20   Shamleffer, Melanie Crazier, MD    Allergies Patient has no known allergies.  Family History  Problem Relation Age of Onset   Diabetes Father    Heart failure Father    Diabetes Brother    Arthritis Mother    Diabetes Mother    Transient ischemic attack Mother    Heart failure Maternal Grandmother    Alcohol abuse Maternal Grandmother    Diabetes Paternal Grandmother    Kidney failure Paternal Grandmother    Heart failure Paternal Grandfather    Cancer Paternal Grandfather        unknown type   Hypertension Brother    Breast cancer Maternal Great-grandmother     Social History Social History   Tobacco Use   Smoking status: Every Day    Types: Cigarettes   Smokeless tobacco: Never   Tobacco comments:    1 pack a week  Vaping Use   Vaping Use: Former  Substance Use Topics   Alcohol use: Yes    Comment: a couple of times a month, typically less than 2 drinks   Drug use: No    Review of Systems  Constitutional: No fever/chills Eyes: No  visual changes. ENT: No sore throat. Cardiovascular: Denies chest pain. Respiratory: Denies shortness of breath. Gastrointestinal: No abdominal pain.  No nausea, no vomiting.  No diarrhea.  No constipation. Genitourinary: Negative for dysuria. Musculoskeletal: Negative for back pain. Skin: Negative for rash. Neurological: Negative for headaches, focal weakness or numbness. Endocrinologic: Positive for hyperglycemia.  ____________________________________________   PHYSICAL EXAM:  VITAL SIGNS: ED Triage Vitals [12/08/20 0023]  Enc Vitals Group     BP (!) 160/107     Pulse Rate 99     Resp 18     Temp 98 F (36.7 C)     Temp Source Oral     SpO2 100 %     Weight 274 lb (124.3 kg)     Height 5\' 8"  (1.727 m)     Head Circumference      Peak Flow  Pain Score 0     Pain Loc      Pain Edu?      Excl. in [place]?     Constitutional: Alert and oriented. Well appearing and in no acute distress. Eyes: Conjunctivae are normal. PERRL. EOMI. Head: Atraumatic. Nose: No congestion/rhinnorhea. Mouth/Throat: Mucous membranes are moist.   Neck: No stridor.   Cardiovascular: Normal rate, regular rhythm. Grossly normal heart sounds.  Good peripheral circulation. Respiratory: Normal respiratory effort.  No retractions. Lungs CTAB. Gastrointestinal: Soft and nontender. No distention. No abdominal bruits. No CVA tenderness. Musculoskeletal: No lower extremity tenderness nor edema.  No joint effusions. Neurologic:  Normal speech and language. No gross focal neurologic deficits are appreciated. No gait instability. Skin:  Skin is warm, dry and intact. No rash noted. Psychiatric: Mood and affect are normal. Speech and behavior are normal.  ____________________________________________   LABS (all labs ordered are listed, but only abnormal results are displayed)  Labs Reviewed  COMPREHENSIVE METABOLIC PANEL - Abnormal; Notable for the following components:      Result Value   Sodium 131  (*)    Glucose, Bld 492 (*)    BUN 22 (*)    All other components within normal limits  URINALYSIS, COMPLETE (UACMP) WITH MICROSCOPIC - Abnormal; Notable for the following components:   Color, Urine YELLOW (*)    APPearance CLEAR (*)    Glucose, UA >=500 (*)    Ketones, ur 5 (*)    All other components within normal limits  CBG MONITORING, ED - Abnormal; Notable for the following components:   Glucose-Capillary 476 (*)    All other components within normal limits  CBG MONITORING, ED - Abnormal; Notable for the following components:   Glucose-Capillary 440 (*)    All other components within normal limits  CBC WITH DIFFERENTIAL/PLATELET   ____________________________________________  EKG  None ____________________________________________  RADIOLOGY I, Gustavus Haskin J, personally viewed and evaluated these images (plain radiographs) as part of my medical decision making, as well as reviewing the written report by the radiologist.  ED MD interpretation: None  Official radiology report(s): No results found.  ____________________________________________   PROCEDURES  Procedure(s) performed (including Critical Care):  Procedures   ____________________________________________   INITIAL IMPRESSION / ASSESSMENT AND PLAN / ED COURSE  As part of my medical decision making, I reviewed the following data within the [place] notes reviewed and incorporated, Labs reviewed, Old chart reviewed, and Notes from prior ED visits     55 year old female presenting with hyperglycemia due to missed insulin dose secondary to broken pen.  Glucose 492 without elevated anion gap.  Will administer IV fluids, insulin and reassess.  Clinical Course as of 12/08/20 0637  Fri Dec 08, 2020  6301 Patient's blood sugar monitor indicates sugar is trending down; currently 283.  IV fluids infusing. [JS]  6010 BS 215 on patient's blood sugar monitor after completion of IV fluids.   Patient providing urine specimen now. [JS]  0502 UA negative for infection.  Patient voices no complaints.  Strict return precautions given.  Patient verbalizes understanding and agrees with plan of care.  Patient is leaving town this afternoon to go to [place].  She will be able to stop by her endocrinologist office to pick up additional insulin pens. [JS]    Clinical Course User Index [JS] Paulette Blanch, MD     ____________________________________________   FINAL CLINICAL IMPRESSION(S) / ED DIAGNOSES  Final diagnoses:  Hyperglycemia     ED Discharge  Orders     None        Note:  This document was prepared using Dragon voice recognition software and may include unintentional dictation errors.    Paulette Blanch, MD 12/08/20 [phone]

## 2020-12-11 ENCOUNTER — Telehealth: Payer: Self-pay | Admitting: *Deleted

## 2020-12-11 NOTE — Telephone Encounter (Signed)
Pt called after hour 12/07/20--stated insulin pen is not working and unable to give herself an injection.  Pt blood sugar is currently 400 but is coming down.  Tried to call pt for an update and LVM to call the office back.

## 2020-12-12 NOTE — Telephone Encounter (Signed)
Patient returned call from 12/11/20 - call back # [phone]

## 2020-12-13 NOTE — Telephone Encounter (Signed)
Spoke to pt stated--went to ER and doing much better, sugar reading less than 200. Notified pt --can contact the pharmacy if this problem occur again.Pt requesting insulin pen, syringe, and needles for back-up, just in case insulin pen stop working. Please advise

## 2020-12-14 MED ORDER — INSULIN ASPART 100 UNIT/ML IJ SOLN: INTRAMUSCULAR | 1 refills | Status: DC

## 2020-12-14 MED ORDER — "INSULIN SYRINGE 31G X 5/16"" 0.3 ML MISC": 1.0000 | Freq: Three times a day (TID) | 1 refills | Status: DC

## 2020-12-14 NOTE — Addendum Note (Signed)
Addended by: Dorita Sciara on: 12/14/2020 09:15 AM   Modules accepted: Orders

## 2020-12-14 NOTE — Telephone Encounter (Signed)
done

## 2020-12-21 ENCOUNTER — Other Ambulatory Visit: Payer: Self-pay | Admitting: Ophthalmology

## 2020-12-21 ENCOUNTER — Ambulatory Visit
Admission: RE | Admit: 2020-12-21 | Discharge: 2020-12-21 | Disposition: A | Discharge status: Home / Self Care | Payer: BC Managed Care – PPO | Source: Ambulatory Visit | Attending: Ophthalmology | Admitting: Ophthalmology

## 2020-12-21 ENCOUNTER — Other Ambulatory Visit
Admission: RE | Admit: 2020-12-21 | Discharge: 2020-12-21 | Disposition: A | Discharge status: Home / Self Care | Payer: BC Managed Care – PPO | Source: Ambulatory Visit | Attending: Ophthalmology | Admitting: Ophthalmology

## 2020-12-21 DIAGNOSIS — H2000 Unspecified acute and subacute iridocyclitis: Secondary | ICD-10-CM | POA: Insufficient documentation

## 2020-12-21 DIAGNOSIS — Z113 Encounter for screening for infections with a predominantly sexual mode of transmission: Secondary | ICD-10-CM | POA: Insufficient documentation

## 2020-12-21 DIAGNOSIS — Z7289 Other problems related to lifestyle: Secondary | ICD-10-CM | POA: Insufficient documentation

## 2020-12-21 DIAGNOSIS — Z117 Encounter for testing for latent tuberculosis infection: Secondary | ICD-10-CM | POA: Diagnosis not present

## 2020-12-21 DIAGNOSIS — Z8619 Personal history of other infectious and parasitic diseases: Secondary | ICD-10-CM | POA: Insufficient documentation

## 2020-12-21 LAB — CBC WITH DIFFERENTIAL/PLATELET
Abs Immature Granulocytes: 0.04 10*3/uL (ref 0.00–0.07)
Basophils Absolute: 0.1 10*3/uL (ref 0.0–0.1)
Basophils Relative: 1 %
Eosinophils Absolute: 0.2 10*3/uL (ref 0.0–0.5)
Eosinophils Relative: 2 %
HCT: 40.7 % (ref 36.0–46.0)
Hemoglobin: 13.9 g/dL (ref 12.0–15.0)
Immature Granulocytes: 1 %
Lymphocytes Relative: 33 %
Lymphs Abs: 2.7 10*3/uL (ref 0.7–4.0)
MCH: 32.5 pg (ref 26.0–34.0)
MCHC: 34.2 g/dL (ref 30.0–36.0)
MCV: 95.1 fL (ref 80.0–100.0)
Monocytes Absolute: 0.5 10*3/uL (ref 0.1–1.0)
Monocytes Relative: 6 %
Neutro Abs: 4.7 10*3/uL (ref 1.7–7.7)
Neutrophils Relative %: 57 %
Platelets: 265 10*3/uL (ref 150–400)
RBC: 4.28 MIL/uL (ref 3.87–5.11)
RDW: 12.4 % (ref 11.5–15.5)
WBC: 8.1 10*3/uL (ref 4.0–10.5)
nRBC: 0 % (ref 0.0–0.2)

## 2020-12-22 LAB — RPR: RPR Ser Ql: NONREACTIVE

## 2020-12-22 LAB — C-REACTIVE PROTEIN: CRP: 0.8 mg/dL (ref <1.0)

## 2020-12-23 LAB — ANGIOTENSIN CONVERTING ENZYME: Angiotensin-Converting Enzyme: 38 U/L (ref 14–82)

## 2020-12-23 LAB — RHEUMATOID FACTOR: Rheumatoid fact SerPl-aCnc: <10 IU/mL (ref <14.0)

## 2020-12-28 LAB — QUANTIFERON-TB GOLD PLUS: QuantiFERON-TB Gold Plus: NEGATIVE

## 2020-12-28 LAB — QUANTIFERON-TB GOLD PLUS (RQFGPL)
QuantiFERON Mitogen Value: >10 IU/mL
QuantiFERON Nil Value: 0.02 IU/mL
QuantiFERON TB1 Ag Value: 0.02 IU/mL
QuantiFERON TB2 Ag Value: 0.02 IU/mL

## 2021-01-01 LAB — MISC LABCORP TEST (SEND OUT): Labcorp test code: 624

## 2021-01-01 LAB — HLA-B27 ANTIGEN: HLA-B27: NEGATIVE

## 2021-01-26 ENCOUNTER — Ambulatory Visit: Payer: BC Managed Care – PPO | Admitting: Internal Medicine

## 2021-01-28 ENCOUNTER — Other Ambulatory Visit: Payer: Self-pay | Admitting: Internal Medicine

## 2021-02-17 ENCOUNTER — Other Ambulatory Visit: Payer: Self-pay | Admitting: Internal Medicine

## 2021-02-18 ENCOUNTER — Other Ambulatory Visit: Payer: Self-pay | Admitting: Internal Medicine

## 2021-03-08 ENCOUNTER — Other Ambulatory Visit: Payer: Self-pay | Admitting: Internal Medicine

## 2021-03-23 ENCOUNTER — Other Ambulatory Visit: Payer: Self-pay | Admitting: Internal Medicine

## 2021-04-09 ENCOUNTER — Other Ambulatory Visit: Payer: Self-pay | Admitting: Family Medicine

## 2021-04-09 ENCOUNTER — Other Ambulatory Visit: Payer: Self-pay | Admitting: Internal Medicine

## 2021-04-09 DIAGNOSIS — E1165 Type 2 diabetes mellitus with hyperglycemia: Secondary | ICD-10-CM

## 2021-04-09 DIAGNOSIS — E039 Hypothyroidism, unspecified: Secondary | ICD-10-CM

## 2021-04-09 DIAGNOSIS — E1169 Type 2 diabetes mellitus with other specified complication: Secondary | ICD-10-CM

## 2021-04-11 NOTE — Telephone Encounter (Signed)
CALLED PATIENT AND GOT HER SCHEDUELED FOR 2/20 @3 

## 2021-04-11 NOTE — Telephone Encounter (Signed)
Please call and schedule pt for a CPE with Dr. Einar Pheasant between 07/03/21 and 07/12/21. Filling for 90 days.

## 2021-04-13 ENCOUNTER — Ambulatory Visit (INDEPENDENT_AMBULATORY_CARE_PROVIDER_SITE_OTHER): Payer: BC Managed Care – PPO | Admitting: Family Medicine

## 2021-04-13 ENCOUNTER — Other Ambulatory Visit: Payer: Self-pay

## 2021-04-13 ENCOUNTER — Encounter: Payer: Self-pay | Admitting: Family Medicine

## 2021-04-13 VITALS — BP 100/70 | HR 99 | Temp 97.8°F | Ht 68.0 in | Wt 290.4 lb

## 2021-04-13 DIAGNOSIS — L02411 Cutaneous abscess of right axilla: Secondary | ICD-10-CM | POA: Insufficient documentation

## 2021-04-13 DIAGNOSIS — R0683 Snoring: Secondary | ICD-10-CM | POA: Diagnosis not present

## 2021-04-13 DIAGNOSIS — I872 Venous insufficiency (chronic) (peripheral): Secondary | ICD-10-CM | POA: Insufficient documentation

## 2021-04-13 DIAGNOSIS — M79671 Pain in right foot: Secondary | ICD-10-CM | POA: Insufficient documentation

## 2021-04-13 DIAGNOSIS — F4321 Adjustment disorder with depressed mood: Secondary | ICD-10-CM | POA: Diagnosis not present

## 2021-04-13 DIAGNOSIS — E1165 Type 2 diabetes mellitus with hyperglycemia: Secondary | ICD-10-CM

## 2021-04-13 MED ORDER — MELOXICAM 15 MG PO TABS: 15.0000 mg | ORAL_TABLET | Freq: Every day | ORAL | 0 refills | Status: DC

## 2021-04-13 MED ORDER — CEPHALEXIN 500 MG PO CAPS: 500.0000 mg | ORAL_CAPSULE | Freq: Three times a day (TID) | ORAL | 0 refills | Status: DC

## 2021-04-13 NOTE — Assessment & Plan Note (Signed)
Compression hose , elevation, weight loss, exercise.

## 2021-04-13 NOTE — Assessment & Plan Note (Signed)
Referral for sleep apnea eval.

## 2021-04-13 NOTE — Patient Instructions (Addendum)
Move forward with counselor as planned.. call if you need a referral.   Expect a call to set up an appt with sleep specialist.  Work on healthy eating,regular exercise and weight loss.   Wear compression hose, elevated feet above heart.  Start home foot physical therapy.  Start meloxicam daily for pain and inflammation.  Follow up or call podiatry if not improving as expected.  Increase frequency of compresses in right axillae.  Complete antibiotic course. .. call if not improving as expected.

## 2021-04-13 NOTE — Assessment & Plan Note (Signed)
Likely foot sprain No indication for X-ray.  No clear sig of gout, charcot foot etc.   treat with home PT and NSAIDs.

## 2021-04-13 NOTE — Assessment & Plan Note (Signed)
Move forward with  Counseling as planned. Gave info on [facility] for back up if work counselor does not work out.

## 2021-04-13 NOTE — Progress Notes (Signed)
Patient ID: Tanya Graves, female    DOB: 12/25/1965, 55 y.o.   MRN: [id number]  This visit was conducted in person.  BP 100/70   Pulse 99   Temp 97.8 F (36.6 C) (Temporal)   Ht 5\' 8"  (1.727 m)   Wt 290 lb 6 oz (131.7 kg)   LMP 06/28/2015 (Approximate)   SpO2 99%   BMI 44.15 kg/m    CC:  Chief Complaint  Patient presents with   Foot Pain    Right-Top of Foot    Foot Swelling    Bilateral   Sore Under Right Arm   Referral    Wants Sleep Study    Subjective:   HPI: Tanya Graves is a 55 y.o. female patient of Dr. Einar Pheasant with history of poorly controlled diabetes presenting on 04/13/2021 for Foot Pain (Right-Top of Foot/), Foot Swelling (Bilateral), Sore Under Right Arm, and Referral (Wants Sleep Study) She has many issues today. Pain in right dorsal foot, ongoing x 2 months. No fall, no known injury. Has tried more supportive shoes  No redness, no heat Negative [place] for DVT  at hospital visit in last month. 2.  Bilateral foot swelling.. chronic issue. Wears compression hose  3. sore under her right arm... redness and swelling in right axillae x 3 week. No drainage.  She has been doing warm compresses    4. Requests a referral for sleep study as she snores... sister noted deep snoring, not sure if apnea.  Sister with sleep Apnea.  Wakes up tired, occ morning headaches. BP Readings from Last 3 Encounters:  04/13/21 100/70  12/08/20 121/82  10/20/20 126/82       DM followed by ENDO.Marland Kitchen on insulin... She has appt on Monday with [facility]. Dexcom.. FBS  107-120, occ 170s.   3 weeks ago She was in hospital in [place] for DKA, anxiety ( father passed away) She is setting up grief counselor.  Lab Results  Component Value Date   HGBA1C 10.8 (A) 10/20/2020        Relevant past medical, surgical, family and social history reviewed and updated as indicated. Interim medical history since our last visit reviewed. Allergies and medications reviewed and  updated. Outpatient Medications Prior to Visit  Medication Sig Dispense Refill   albuterol (VENTOLIN HFA) 108 (90 Base) MCG/ACT inhaler Inhale 2 puffs into the lungs every 6 (six) hours as needed for wheezing or shortness of breath. 18 g 3   atorvastatin (LIPITOR) 20 MG tablet Take 1 tablet (20 mg total) by mouth daily. 90 tablet 3   BD INSULIN SYRINGE U/F 1/2UNIT 31G X 5/16" 0.3 ML MISC USE WITH NOVOLOG FOR THREE TIMES DAILY INJECTIONS 100 each 1   Continuous Blood Gluc Receiver (DEXCOM G6 RECEIVER) DEVI Dexcom G6 Receiver misc  USE UTD     Continuous Blood Gluc Sensor (DEXCOM G6 SENSOR) MISC APPLY 1 SENSOR EVERY 10 DAYS AS DIRECTED 3 each 0   Continuous Blood Gluc Transmit (DEXCOM G6 TRANSMITTER) MISC USE AS DIRECTED FOR CONTINUOUS BLOOD GLUCOSE MONITORING. CHANGE EVERY 3 MONTHS 1 each 3   glucose blood (CONTOUR NEXT TEST) test strip 4 times a day 150 each 12   Insulin Pen Needle (BD PEN NEEDLE MICRO U/F) 32G X 6 MM MISC 4x a day 150 each 3   levothyroxine (SYNTHROID) 175 MCG tablet TAKE ONE TABLET BY MOUTH EVERY MORNING BEFORE BREAKFAST 90 tablet 0   NOVOLOG 100 UNIT/ML injection INJECT A MAXIMUM OF 60 UNITS  SUBCUTANEOUSLY DAILY 10 mL 0   TOUJEO MAX SOLOSTAR 300 UNIT/ML Solostar Pen INJECT 36 UNITS UNDER THE SKIN DAILY 6 mL 2   conjugated estrogens (PREMARIN) vaginal cream INSERT 1 GRAM VAGINALLY  NIGHTLY FOR 2 WEEKS THEN 1 GRAM EVERY OTHER NIGHT FOR 2 WEEKS THEN 1 GRAM TWO NIGHTS A WEEK THEREAFTER (Patient taking differently: INSERT 1 GRAM VAGINALLY  NIGHTLY FOR 2 WEEKS THEN 1 GRAM EVERY OTHER NIGHT FOR 2 WEEKS THEN 1 GRAM TWO NIGHTS A WEEK THEREAFTER) 42.5 g 1   cyclobenzaprine (FLEXERIL) 10 MG tablet Take 1 tablet (10 mg total) by mouth 3 (three) times daily as needed for muscle spasms. 30 tablet 0   INPEN 100-PINK-NOVOLOG-FIASP DEVI USE AS DIRECTED 1 each 3   insulin aspart (NOVOLOG) cartridge Max daily 80 units 30 mL 6   metFORMIN (GLUCOPHAGE) 1000 MG tablet Take 1 tablet (1,000 mg total)  by mouth 2 (two) times daily with a meal. 180 tablet 3   pantoprazole (PROTONIX) 40 MG tablet Take 1 tablet (40 mg total) by mouth 2 (two) times daily. (Patient not taking: Reported on 10/20/2020) 180 tablet 3   PREDNISOLONE ACETATE P-F 1 % ophthalmic suspension Place 1 drop into the left eye 4 (four) times daily.     No facility-administered medications prior to visit.     Per HPI unless specifically indicated in ROS section below Review of Systems  Constitutional:  Negative for fatigue and fever.  HENT:  Negative for congestion.   Eyes:  Negative for pain.  Respiratory:  Negative for cough and shortness of breath.   Cardiovascular:  Negative for chest pain, palpitations and leg swelling.  Gastrointestinal:  Negative for abdominal pain.  Genitourinary:  Negative for dysuria and vaginal bleeding.  Musculoskeletal:  Negative for back pain.  Neurological:  Negative for syncope, light-headedness and headaches.  Psychiatric/Behavioral:  Negative for dysphoric mood.   Objective:  BP 100/70   Pulse 99   Temp 97.8 F (36.6 C) (Temporal)   Ht 5\' 8"  (1.727 m)   Wt 290 lb 6 oz (131.7 kg)   LMP 06/28/2015 (Approximate)   SpO2 99%   BMI 44.15 kg/m   Wt Readings from Last 3 Encounters:  04/13/21 290 lb 6 oz (131.7 kg)  12/08/20 274 lb (124.3 kg)  10/20/20 290 lb 8 oz (131.8 kg)      Physical Exam Constitutional:      General: She is not in acute distress.    Appearance: Normal appearance. She is well-developed. She is obese. She is not ill-appearing or toxic-appearing.  HENT:     Head: Normocephalic.     Right Ear: Hearing, tympanic membrane, ear canal and external ear normal. Tympanic membrane is not erythematous, retracted or bulging.     Left Ear: Hearing, tympanic membrane, ear canal and external ear normal. Tympanic membrane is not erythematous, retracted or bulging.     Nose: No mucosal edema or rhinorrhea.     Right Sinus: No maxillary sinus tenderness or frontal sinus  tenderness.     Left Sinus: No maxillary sinus tenderness or frontal sinus tenderness.     Mouth/Throat:     Pharynx: Uvula midline.  Eyes:     General: Lids are normal. Lids are everted, no foreign bodies appreciated.     Conjunctiva/sclera: Conjunctivae normal.     Pupils: Pupils are equal, round, and reactive to light.  Neck:     Thyroid: No thyroid mass or thyromegaly.     Vascular: No carotid bruit.  Trachea: Trachea normal.  Cardiovascular:     Rate and Rhythm: Normal rate and regular rhythm.     Pulses: Normal pulses.     Heart sounds: Normal heart sounds, S1 normal and S2 normal. No murmur heard.   No friction rub. No gallop.  Pulmonary:     Effort: Pulmonary effort is normal. No tachypnea or respiratory distress.     Breath sounds: Normal breath sounds. No decreased breath sounds, wheezing, rhonchi or rales.  Abdominal:     General: Bowel sounds are normal.     Palpations: Abdomen is soft.     Tenderness: There is no abdominal tenderness.  Musculoskeletal:     Cervical back: Normal range of motion and neck supple.     Right foot: Decreased range of motion.  Feet:     Right foot:     Skin integrity: Skin integrity normal.     Comments: Ttp over dorsal foot, nonfocal Skin:    General: Skin is warm and dry.     Findings: No rash.  Neurological:     Mental Status: She is alert.  Psychiatric:        Mood and Affect: Mood is not anxious or depressed.        Speech: Speech normal.        Behavior: Behavior normal. Behavior is cooperative.        Thought Content: Thought content normal.        Judgment: Judgment normal.      Results for orders placed or performed during the hospital encounter of 12/21/20  C-reactive protein  Result Value Ref Range   CRP 0.8 <1.0 mg/dL  QuantiFERON-TB Gold Plus  Result Value Ref Range   QuantiFERON Incubation Incubation performed.    QuantiFERON-TB Gold Plus Negative Negative  QuantiFERON-TB Gold Plus  Result Value Ref Range    QuantiFERON Criteria Comment    QuantiFERON TB1 Ag Value 0.02 IU/mL   QuantiFERON TB2 Ag Value 0.02 IU/mL   QuantiFERON Nil Value 0.02 IU/mL   QuantiFERON Mitogen Value >10.00 IU/mL    This visit occurred during the SARS-CoV-2 public health emergency.  Safety protocols were in place, including screening questions prior to the visit, additional usage of staff PPE, and extensive cleaning of exam room while observing appropriate contact time as indicated for disinfecting solutions.   COVID 19 screen:  No recent travel or known exposure to COVID19 The patient denies respiratory symptoms of COVID 19 at this time. The importance of social distancing was discussed today.   Assessment and Plan Problem List Items Addressed This Visit     Abscess of right axilla    Treat with warm compresses and oral antibiotics.  No clear indication for I and D or MRSA coverage.       Grieving    Move forward with  Counseling as planned. Gave info on [facility] for back up if work counselor does not work out.      Poorly controlled type 2 diabetes mellitus ([place])    Improved control   keep follow up with  ENDO as planned.      Right foot pain    Likely foot sprain No indication for X-ray.  No clear sig of gout, charcot foot etc.   treat with home PT and NSAIDs.       Snoring - Primary    Referral for sleep apnea eval.      Relevant Orders   Ambulatory referral to Pulmonology   Venous insufficiency  Compression hose , elevation, weight loss, exercise.          Eliezer Lofts, MD

## 2021-04-13 NOTE — Assessment & Plan Note (Signed)
Improved control   keep follow up with  ENDO as planned.

## 2021-04-13 NOTE — Assessment & Plan Note (Signed)
Treat with warm compresses and oral antibiotics.  No clear indication for I and D or MRSA coverage.

## 2021-04-16 ENCOUNTER — Encounter: Payer: Self-pay | Admitting: Internal Medicine

## 2021-04-16 ENCOUNTER — Other Ambulatory Visit: Payer: Self-pay

## 2021-04-16 ENCOUNTER — Ambulatory Visit (INDEPENDENT_AMBULATORY_CARE_PROVIDER_SITE_OTHER): Payer: BC Managed Care – PPO | Admitting: Internal Medicine

## 2021-04-16 VITALS — BP 130/80 | HR 86 | Ht 68.0 in | Wt 289.0 lb

## 2021-04-16 DIAGNOSIS — E1142 Type 2 diabetes mellitus with diabetic polyneuropathy: Secondary | ICD-10-CM | POA: Diagnosis not present

## 2021-04-16 DIAGNOSIS — E1165 Type 2 diabetes mellitus with hyperglycemia: Secondary | ICD-10-CM | POA: Diagnosis not present

## 2021-04-16 DIAGNOSIS — Z794 Long term (current) use of insulin: Secondary | ICD-10-CM

## 2021-04-16 DIAGNOSIS — E890 Postprocedural hypothyroidism: Secondary | ICD-10-CM | POA: Diagnosis not present

## 2021-04-16 DIAGNOSIS — E785 Hyperlipidemia, unspecified: Secondary | ICD-10-CM

## 2021-04-16 DIAGNOSIS — E1169 Type 2 diabetes mellitus with other specified complication: Secondary | ICD-10-CM

## 2021-04-16 LAB — POCT GLYCOSYLATED HEMOGLOBIN (HGB A1C): Hemoglobin A1C: 10.8 % — AB (ref 4.0–5.6)

## 2021-04-16 MED ORDER — TIRZEPATIDE 2.5 MG/0.5ML ~~LOC~~ SOAJ: 2.5000 mg | SUBCUTANEOUS | 3 refills | Status: DC

## 2021-04-16 NOTE — Progress Notes (Signed)
Name: Tanya Graves  Age/ Sex: 55 y.o., female   MRN/ DOB: [id number], 07-07-1965     PCP: Tanya Noe, MD   Reason for Endocrinology Evaluation: Type 2 Diabetes Mellitus  Initial Endocrine Consultative Visit: 07/08/2018    PATIENT IDENTIFIER: Tanya Graves is a 55 y.o. female with a past medical history of T2DM, HTN, Dyslipidemia and hypothyroidism . The patient has followed with Endocrinology clinic since 07/08/2018 for consultative assistance with management of her diabetes.  DIABETIC HISTORY:  [name] was diagnosed with T2DM ~ 2010.  She does not recall intolerance to other oral glycemic agents.  On her initial visit here she was on MDI regimen of insulin and metformin. Her hemoglobin A1c has ranged from 11.4%  in 2016, peaking at 12.5% in 2019  GLP-1 and SGLT-2 inhibitors have been cost prohibitive    Thyroid Disease   She is S/P RAI ablation years ago > 20 yrs ago and has been on levothyroxine . She is on Levothyroxine 175 mcg daily   SUBJECTIVE:   During the last visit (06/30/2020): A1c 10.5% . We continued Metformin, and adjusted MDI regimen     Today (04/16/2021): [name] for a follow-up on diabetes management.  She checks her blood sugars multiple  times daily, through CGM. The patient has had hypoglycemic episodes since the last clinic visit.   She tends to hold prandial insulin with normal BG's   Over the past few months she has had multiple family losses and has been very stressed out.  She had an ED visit 11/2020 PE/DVT ruled out , was noted with hyperglycemia , metformin was held    She has right foot tendonitis , seeing podiatry on [place]:  Metformin 1000 mg twice daily with meals- not taking Toujeo 24 units daily  Novolog  15 units TIDQAC    CONTINUOUS GLUCOSE MONITORING RECORD INTERPRETATION    Dates of Recording: 11/8-11/21/2022  Sensor description: Dexcom  Results statistics:   CGM use % of time 100   Average and SD 249/103  Time in range   30 %  % Time Above 180 21  % Time above 250 48  % Time Below target <1    Glycemic patterns summary: Hyperglycemia during the day and night  With the occasional hypoglycemia during the day   Hyperglycemic episodes all day  and night   Hypoglycemic episodes occurred overnight   DIABETIC COMPLICATIONS: Microvascular complications:  Neuropathy , worsening DR Denies: CKD  Last eye exam: Completed  07/18/2019   Macrovascular complications:    Denies: CAD, PVD, CVA    HISTORY:  Past Medical History:  Past Medical History:  Diagnosis Date   Arthritis    Diabetes mellitus without complication ([place])    type 2   Thyroid disease    Past Surgical History:  Past Surgical History:  Procedure Laterality Date   APPENDECTOMY  1993   CALCANEAL OSTEOTOMY Left 07/17/2016   Procedure: CALCANEAL OSTEOTOMY;  Surgeon: [name], MD;  Location: [facility] ORS;  Service: Orthopedics;  Laterality: Left;   COLONOSCOPY  12/18/2018   found 1 33mm polyp, going back in 5 years    COLONOSCOPY WITH PROPOFOL N/A 12/18/2018   Procedure: COLONOSCOPY WITH PROPOFOL;  Surgeon: [name], MD;  Location: [facility] ENDOSCOPY;  Service: Gastroenterology;  Laterality: N/A;   IRRIGATION AND DEBRIDEMENT ABSCESS N/A 01/24/2015   Procedure: IRRIGATION AND DEBRIDEMENT ABSCESS/GROIN ABSCESS;  Surgeon: [name], MD;  Location: [facility] ORS;  Service: General;  Laterality: N/A;   KNEE ARTHROSCOPY W/ MENISCAL REPAIR Right 2008   ROTATOR CUFF REPAIR Right 2015   TONSILLECTOMY  1998   Social History:  reports that she has been smoking cigarettes. She has never used smokeless tobacco. She reports current alcohol use. She reports that she does not use drugs. Family History:  Family History  Problem Relation Age of Onset   Diabetes Father    Heart failure Father    Diabetes Brother    Arthritis Mother    Diabetes Mother    Transient ischemic attack Mother    Heart failure  Maternal Grandmother    Alcohol abuse Maternal Grandmother    Diabetes Paternal Grandmother    Kidney failure Paternal Grandmother    Heart failure Paternal Grandfather    Cancer Paternal Grandfather        unknown type   Hypertension Brother    Breast cancer Maternal Great-grandmother     OBJECTIVE:   Vital Signs: BP 130/80 (BP Location: Left Arm, Patient Position: Sitting, Cuff Size: Small)   Pulse 86   Ht 5\' 8"  (1.727 m)   Wt 289 lb (131.1 kg)   LMP 06/28/2015 (Approximate)   SpO2 99%   BMI 43.94 kg/m   Wt Readings from Last 3 Encounters:  04/16/21 289 lb (131.1 kg)  04/13/21 290 lb 6 oz (131.7 kg)  12/08/20 274 lb (124.3 kg)     Exam: General: Pt appears well and is in NAD  Lungs: Clear with good BS bilat   Heart: RRR   Extremities: No pretibial edema.   Neuro: MS is good with appropriate affect, pt is alert and Ox3   DM foot exam: 02/28/2020 The skin of the feet is intact without sores or ulcerations. The pedal pulses are 2+ on right and 2+ on left. The sensation is intact to a screening 5.07, 10 gram monofilament bilaterally     DATA REVIEWED:  Lab Results  Component Value Date   HGBA1C 10.8 (A) 04/16/2021   HGBA1C 10.8 (A) 10/20/2020   HGBA1C 10.5 (A) 06/30/2020    Latest Reference Range & Units 04/16/21 16:08  Sodium 135 - 145 mEq/L 139  Potassium 3.5 - 5.1 mEq/L 4.8  Chloride 96 - 112 mEq/L 103  CO2 19 - 32 mEq/L 27  Glucose 70 - 99 mg/dL 101 (H)  BUN 6 - 23 mg/dL 20  Creatinine 0.40 - 1.20 mg/dL 0.81  Calcium 8.4 - 10.5 mg/dL 9.4  GFR >60.00 mL/min 82.37  Total CHOL/HDL Ratio  3  Cholesterol 0 - 200 mg/dL 176  HDL Cholesterol >39.00 mg/dL 66.70  LDL (calc) 0 - 99 mg/dL 80  MICROALB/CREAT RATIO 0.0 - 30.0 mg/g 1.6  NonHDL  109.54  Triglycerides 0.0 - 149.0 mg/dL 146.0  VLDL 0.0 - 40.0 mg/dL 29.2  (H): Data is abnormally high   Latest Reference Range & Units 04/16/21 16:08  Glucose 70 - 99 mg/dL 101 (H)  TSH 0.35 - 5.50 uIU/mL 1.78   (H): Data is abnormally high     ASSESSMENT / PLAN / RECOMMENDATIONS:  1)Type 2 Diabetes Mellitus, Poorly controlled, With Neuropathic and retinopathic complications - Most recent A1c of 10.8 %. Goal A1c < 7.0 %.   - continues with hyperglycemia, A1c the same over the last 6 months  - She has fear of hypoglycemia , which limits optimization of glucose ,  so has been holding prandial insulin with normal glucose, she missed her RD/CDE appointment , pt encouraged to reschedule  -  She continues with insulin-Carb mismatch another reason that makes it difficult to reach optimal BG's and leads to glycemic excursions  - Historically SGLT-2 inhibitors ( farxiga in 2020) and GLP-1 agonists ( Rybelsus in 2021) have been cost prohibitive  - Per pt insulin pump cost-prohibitive in the past  - She is interested on Tirzepatide , no hx of pancreatitis. Cautioned against GI side effects  - Coupons provided  - Will reduce prandial insulin for breakfast and lunch , but continue evening dose  -I will recommend going back to metformin, this decision will be deferred to the patient  MEDICATIONS: Start Mounjaro 2.5 mg weekly  Continue  Toujeo 24 units daily  Change  NovoLog  05/07/14 units  Continue  CF : Novolog ( BG-130/25)  Restart metformin 1000 mg twice daily    EDUCATION / INSTRUCTIONS: BG monitoring instructions: Patient is instructed to check her blood sugars 4 times a day, before meals and bedtime. Call [facility] if: BG persistently < 70  I reviewed the Rule of 15 for the treatment of hypoglycemia in detail with the patient. Literature supplied.  2) Post ablative hypothyroidism:  -Repeat TSH is normal -No local neck symptoms  Medication Continue levothyroxine 175 MCG daily    3)Dyslipidemia:  -LDL at goal  Medication   Continue atorvastatin 20 mg daily   F/U in 4 months     Signed electronically by: Mack Guise, MD  [facility]   [facility] [address] [place] [address], [place] [address] Phone: [phone] FAX: [phone]   CC: Tanya Graves, [place] [address] Phone: [phone]  Fax: [phone]  Return to Endocrinology clinic as below: Future Appointments  Date Time Provider [place]  04/23/2021  3:00 PM Edrick Kins, DPM [facility]  07/16/2021  3:00 PM Tanya Noe, MD [facility]

## 2021-04-16 NOTE — Patient Instructions (Addendum)
-   Start Mounjaro 2.5 mg weekly  - Continue Toujeo 24  units daily  - Novolog 12 units with Breakfast,12 units with  Lunch and 15 units with supper   - Novolog correctional insulin: ADD extra units on insulin to your meal-time Novolog dose if your blood sugars are higher than 155 . Use the scale below to help guide you:   Blood sugar before meal Number of units to inject  Less than 155 0 unit  156 -  180 1 units  181 -  205 2 units  206 -  230 3 units  231 -  255 4 units  256 -  280 5 units  281 -  305 6 units  306 -  330 7 units  331 -  355 8 units  356 - 380 9 units         HOW TO TREAT LOW BLOOD SUGARS (Blood sugar LESS THAN 70 MG/DL) Please follow the RULE OF 15 for the treatment of hypoglycemia treatment (when your (blood sugars are less than 70 mg/dL)   STEP 1: Take 15 grams of carbohydrates when your blood sugar is low, which includes:  3-4 GLUCOSE TABS  OR 3-4 OZ OF JUICE OR REGULAR SODA OR ONE TUBE OF GLUCOSE GEL    STEP 2: RECHECK blood sugar in 15 MINUTES STEP 3: If your blood sugar is still low at the 15 minute recheck --> then, go back to STEP 1 and treat AGAIN with another 15 grams of carbohydrates.

## 2021-04-17 LAB — BASIC METABOLIC PANEL
BUN: 20 mg/dL (ref 6–23)
CO2: 27 mEq/L (ref 19–32)
Calcium: 9.4 mg/dL (ref 8.4–10.5)
Chloride: 103 mEq/L (ref 96–112)
Creatinine, Ser: 0.81 mg/dL (ref 0.40–1.20)
GFR: 82.37 mL/min (ref >60.00)
Glucose, Bld: 101 mg/dL — ABNORMAL HIGH (ref 70–99)
Potassium: 4.8 mEq/L (ref 3.5–5.1)
Sodium: 139 mEq/L (ref 135–145)

## 2021-04-17 LAB — LIPID PANEL
Cholesterol: 176 mg/dL (ref 0–200)
HDL: 66.7 mg/dL (ref >39.00)
LDL Cholesterol: 80 mg/dL (ref 0–99)
NonHDL: 109.54
Total CHOL/HDL Ratio: 3
Triglycerides: 146 mg/dL (ref 0.0–149.0)
VLDL: 29.2 mg/dL (ref 0.0–40.0)

## 2021-04-17 LAB — MICROALBUMIN / CREATININE URINE RATIO
Creatinine,U: 110.1 mg/dL
Microalb Creat Ratio: 1.6 mg/g (ref 0.0–30.0)
Microalb, Ur: 1.8 mg/dL (ref 0.0–1.9)

## 2021-04-17 LAB — TSH: TSH: 1.78 u[IU]/mL (ref 0.35–5.50)

## 2021-04-18 MED ORDER — ATORVASTATIN CALCIUM 20 MG PO TABS: 20.0000 mg | ORAL_TABLET | Freq: Every day | ORAL | 3 refills | Status: DC

## 2021-04-18 MED ORDER — METFORMIN HCL 1000 MG PO TABS: 1000.0000 mg | ORAL_TABLET | Freq: Two times a day (BID) | ORAL | 3 refills | Status: DC

## 2021-04-21 ENCOUNTER — Other Ambulatory Visit: Payer: Self-pay | Admitting: Internal Medicine

## 2021-04-23 ENCOUNTER — Ambulatory Visit (INDEPENDENT_AMBULATORY_CARE_PROVIDER_SITE_OTHER): Payer: BC Managed Care – PPO | Admitting: Podiatry

## 2021-04-23 ENCOUNTER — Other Ambulatory Visit: Payer: Self-pay

## 2021-04-23 ENCOUNTER — Ambulatory Visit (INDEPENDENT_AMBULATORY_CARE_PROVIDER_SITE_OTHER): Payer: BC Managed Care – PPO

## 2021-04-23 DIAGNOSIS — M778 Other enthesopathies, not elsewhere classified: Secondary | ICD-10-CM | POA: Diagnosis not present

## 2021-04-23 MED ORDER — BETAMETHASONE SOD PHOS & ACET 6 (3-3) MG/ML IJ SUSP
3.0000 mg | Freq: Once | INTRAMUSCULAR | Status: AC
  Administered 2021-04-23: 16:00:00 3 mg via INTRA_ARTICULAR

## 2021-04-23 MED ORDER — MELOXICAM 15 MG PO TABS: 15.0000 mg | ORAL_TABLET | Freq: Every day | ORAL | 0 refills | Status: DC

## 2021-04-23 NOTE — Progress Notes (Signed)
   HPI: 55 y.o. female presenting today for new complaint regarding pain and tenderness to the right foot this been going on for few months now.  She denies a history of injury.  She was prescribed some meloxicam to help alleviate the pain which has helped minimally.  She presents for further treatment and evaluation  Past Medical History:  Diagnosis Date   Arthritis    Diabetes mellitus without complication ([place])    type 2   Thyroid disease      Physical Exam: General: The patient is alert and oriented x3 in no acute distress.  Dermatology: Skin is warm, dry and supple bilateral lower extremities. Negative for open lesions or macerations.  Vascular: Palpable pedal pulses bilaterally. No edema or erythema noted. Capillary refill within normal limits.  Neurological: Epicritic and protective threshold grossly intact bilaterally.   Musculoskeletal Exam: Range of motion within normal limits to all pedal and ankle joints bilateral. Muscle strength 5/5 in all groups bilateral.  Pain on palpation to the first TMT/Lisfranc joint right  Radiographic Exam:  Normal osseous mineralization. Joint spaces preserved. No fracture/dislocation/boney destruction.    Assessment: 1.  TMT capsulitis right foot   Plan of Care:  1. Patient evaluated. X-Rays reviewed.  2.  Injection of 0.5 cc Celestone Soluspan injection of the TMT joint right foot 3.  Advised against going barefoot.  Recommend good supportive shoes 4.  Refill prescription for meloxicam 15 mg daily 5.  Patient declined the cam boot  6.  Return to clinic in 4 weeks      Edrick Kins, DPM [facility]  Dr. Edrick Kins, DPM    [address],  [address]                Office [phone]  Fax [phone]

## 2021-04-26 ENCOUNTER — Other Ambulatory Visit: Payer: Self-pay | Admitting: Internal Medicine

## 2021-05-06 ENCOUNTER — Other Ambulatory Visit: Payer: Self-pay | Admitting: Internal Medicine

## 2021-05-08 ENCOUNTER — Other Ambulatory Visit: Payer: Self-pay | Admitting: Internal Medicine

## 2021-05-13 ENCOUNTER — Other Ambulatory Visit: Payer: Self-pay | Admitting: Internal Medicine

## 2021-05-13 DIAGNOSIS — E1165 Type 2 diabetes mellitus with hyperglycemia: Secondary | ICD-10-CM

## 2021-05-26 ENCOUNTER — Other Ambulatory Visit: Payer: Self-pay | Admitting: Internal Medicine

## 2021-06-08 ENCOUNTER — Other Ambulatory Visit: Payer: Self-pay | Admitting: Podiatry

## 2021-06-15 ENCOUNTER — Other Ambulatory Visit: Payer: Self-pay | Admitting: Internal Medicine

## 2021-06-15 DIAGNOSIS — E1165 Type 2 diabetes mellitus with hyperglycemia: Secondary | ICD-10-CM

## 2021-06-20 ENCOUNTER — Other Ambulatory Visit: Payer: Self-pay | Admitting: Internal Medicine

## 2021-06-20 ENCOUNTER — Other Ambulatory Visit: Payer: Self-pay

## 2021-06-20 ENCOUNTER — Ambulatory Visit: Payer: BC Managed Care – PPO | Admitting: Pulmonary Disease

## 2021-06-20 ENCOUNTER — Encounter: Payer: Self-pay | Admitting: Pulmonary Disease

## 2021-06-20 VITALS — BP 126/82 | HR 81 | Temp 98.2°F | Ht 68.0 in | Wt 273.6 lb

## 2021-06-20 DIAGNOSIS — R0683 Snoring: Secondary | ICD-10-CM

## 2021-06-20 NOTE — Patient Instructions (Signed)
X home sleep test °

## 2021-06-20 NOTE — Progress Notes (Signed)
Subjective:    Patient ID: Tanya Graves, female    DOB: September 06, 1965, 56 y.o.   MRN: [id number]  HPI  56 year old smoker presents for evaluation of sleep disordered breathing. She works as an Scientist, water quality to a Clinical biochemist.  No bed partner history is available since she lives alone and has a foster 55 year old daughter.  She was visiting her sister after the recent demise of her father and was told that she has loud snoring and repeated awakenings through the night.  Her sister and her brother have OSA and uses CPAP device. Epworth sleepiness score is 9 and she reports sleepiness in the afternoons, while sitting and reading or as a passenger in the car she gets comfortable. Bedtime is between 9 and 10 PM, sleep latency about 20 minutes, she sleeps on her side with 1 pillow.  She has a 4 inch memory foam mattress [place], reports 4-6 nocturnal awakenings and wakes up with a dry mouth around 5:30 AM feeling tired but denies headaches. She has gained 40 pounds over the last 2 years, she peaked at 53 and has now lost to her current weight of 273 pounds There is no history suggestive of cataplexy, sleep paralysis or parasomnias  She lived in [place] and moved to [place] in 2015 after her divorce  PMH -chronic sinusitis, worse in fall/winter, uses Flonase and OTC meds. -has a dog for 21 years, her cat died after 18 years  Diabetes -Asthma as a child that she grew out of  Reviewed PCP record and recent visits  Past Medical History:  Diagnosis Date   Arthritis    Diabetes mellitus without complication ([place])    type 2   Thyroid disease     Past Surgical History:  Procedure Laterality Date   [place] Left 07/17/2016   Procedure: CALCANEAL OSTEOTOMY;  Surgeon: [name], MD;  Location: [facility] ORS;  Service: Orthopedics;  Laterality: Left;   COLONOSCOPY  12/18/2018   found 1 36mm polyp, going back in 5 years    COLONOSCOPY WITH  PROPOFOL N/A 12/18/2018   Procedure: COLONOSCOPY WITH PROPOFOL;  Surgeon: [name], MD;  Location[facility] ENDOSCOPY;  Service: Gastroenterology;  Laterality: N/A;   IRRIGATION AND DEBRIDEMENT ABSCESS N/A 01/24/2015   Procedure: IRRIGATION AND DEBRIDEMENT ABSCESS/GROIN ABSCESS;  Surgeon: [name], MD;  Location: [facility] ORS;  Service: General;  Laterality: N/A;   KNEE ARTHROSCOPY W/ MENISCAL REPAIR Right 2008   ROTATOR CUFF REPAIR Right 2015   TONSILLECTOMY  1998     No Known Allergies  Social History   Socioeconomic History   Marital status: Single    Spouse name: Not on file   Number of children: 0   Years of education: Associates   Highest education level: Not on file  Occupational History   Not on file  Tobacco Use   Smoking status: Every Day    Types: Cigarettes   Smokeless tobacco: Never   Tobacco comments:    1 pack a week  Vaping Use   Vaping Use: Former  Substance and Sexual Activity   Alcohol use: Yes    Comment: a couple of times a month, typically less than 2 drinks   Drug use: No   Sexual activity: Not Currently    Birth control/protection: Post-menopausal  Other Topics Concern   Not on file  Social History Narrative   Lives alone   Dog - Sugar   Enjoys: hiking - looking for local  trails   Exercise: walking trails with dog   Diet: doing well, did weight watchers, watching carbs   Social Determinants of Health   Financial Resource Strain: Not on file  Food Insecurity: Not on file  Transportation Needs: Not on file  Physical Activity: Not on file  Stress: Not on file  Social Connections: Not on file  Intimate Partner Violence: Not on file    Family History  Problem Relation Age of Onset   Diabetes Father    Heart failure Father    Diabetes Brother    Arthritis Mother    Diabetes Mother    Transient ischemic attack Mother    Heart failure Maternal Grandmother    Alcohol abuse Maternal Grandmother    Diabetes Paternal Grandmother     Kidney failure Paternal Grandmother    Heart failure Paternal Grandfather    Cancer Paternal Grandfather        unknown type   Hypertension Brother    Breast cancer Maternal Great-grandmother       Review of Systems Acid heartburn Nasal congestion and sneezing Joint stiffness    Constitutional: negative for anorexia, fevers and sweats  Eyes: negative for irritation, redness and visual disturbance  Ears, nose, mouth, throat, and face: negative for earaches, epistaxis and sore throat  Respiratory: negative for cough, dyspnea on exertion, sputum and wheezing  Cardiovascular: negative for chest pain, dyspnea, lower extremity edema, orthopnea, palpitations and syncope  Gastrointestinal: negative for abdominal pain, constipation, diarrhea, melena, nausea and vomiting  Genitourinary:negative for dysuria, frequency and hematuria  Hematologic/lymphatic: negative for bleeding, easy bruising and lymphadenopathy  Musculoskeletal:negative for arthralgias, muscle weakness  Neurological: negative for coordination problems, gait problems, headaches and weakness  Endocrine: negative for diabetic symptoms including polydipsia, polyuria and weight loss     Objective:   Physical Exam  Gen. Pleasant, obese, in no distress, normal affect ENT - no pallor,icterus, no post nasal drip, class 2-3 airway Neck: No JVD, no thyromegaly, no carotid bruits Lungs: no use of accessory muscles, no dullness to percussion, decreased without rales or rhonchi  Cardiovascular: Rhythm regular, heart sounds  normal, no murmurs or gallops, no peripheral edema Abdomen: soft and non-tender, no hepatosplenomegaly, BS normal. Musculoskeletal: No deformities, no cyanosis or clubbing Neuro:  alert, non focal, no tremors       Assessment & Plan:   Obesity -close correlation between weight gain and OSA was discussed and weight loss emphasized as a primary treatment measure

## 2021-06-20 NOTE — Assessment & Plan Note (Signed)
Given excessive daytime somnolence, narrow pharyngeal exam, witnessed apneas & loud snoring, obstructive sleep apnea is very likely & an overnight polysomnogram will be scheduled as a home study. The pathophysiology of obstructive sleep apnea , it's cardiovascular consequences & modes of treatment including CPAP were discused with the patient in detail & they evidenced understanding.  Pretest probability is high, her recent weight gain is likely contributing

## 2021-06-25 ENCOUNTER — Telehealth: Payer: Self-pay | Admitting: Pharmacy Technician

## 2021-06-25 ENCOUNTER — Other Ambulatory Visit (HOSPITAL_COMMUNITY): Payer: Self-pay

## 2021-06-25 ENCOUNTER — Ambulatory Visit: Payer: BC Managed Care – PPO | Admitting: Pulmonary Disease

## 2021-06-25 NOTE — Telephone Encounter (Signed)
Patient Advocate Encounter   Received notification from CoverMyMeds that prior authorization for Mounjaro 2.5mg  is required by his/her insurance [employer] [place].   PA submitted on 06/25/21 Key [id number] Status is pending    Per PA form, pt may need to try ozempic, Trulicity, and Victoza. Per test claims, these all require step therapy (likely just have to note the pt has used Metformin and or insulin.)  [facility] will continue to follow:  Patient Advocate Fax:  [phone]

## 2021-07-03 ENCOUNTER — Other Ambulatory Visit: Payer: Self-pay | Admitting: Internal Medicine

## 2021-07-04 NOTE — Telephone Encounter (Signed)
Received a fax regarding Prior Authorization from [place] for [facility] 2.5MG . Authorization has been DENIED because THIS MEDICATION IS NOT ON FORMULARY AND ALL FORMULARY MEDICATIONS HAVE NOT BEEN TRIED/FAILED: bydureon, Ozempic, Trulicity, Victoza.

## 2021-07-08 ENCOUNTER — Other Ambulatory Visit: Payer: Self-pay | Admitting: Internal Medicine

## 2021-07-08 DIAGNOSIS — E039 Hypothyroidism, unspecified: Secondary | ICD-10-CM

## 2021-07-11 ENCOUNTER — Other Ambulatory Visit: Payer: Self-pay | Admitting: Podiatry

## 2021-07-16 ENCOUNTER — Encounter: Payer: BC Managed Care – PPO | Admitting: Family Medicine

## 2021-08-03 ENCOUNTER — Encounter: Payer: Self-pay | Admitting: Internal Medicine

## 2021-08-03 ENCOUNTER — Other Ambulatory Visit: Payer: Self-pay

## 2021-08-03 ENCOUNTER — Encounter: Payer: Self-pay | Admitting: Pulmonary Disease

## 2021-08-03 ENCOUNTER — Other Ambulatory Visit: Payer: Self-pay | Admitting: Podiatry

## 2021-08-03 DIAGNOSIS — E1165 Type 2 diabetes mellitus with hyperglycemia: Secondary | ICD-10-CM

## 2021-08-03 MED ORDER — TOUJEO MAX SOLOSTAR 300 UNIT/ML ~~LOC~~ SOPN: PEN_INJECTOR | SUBCUTANEOUS | 2 refills | Status: DC

## 2021-08-03 MED ORDER — INSULIN ASPART 100 UNIT/ML IJ SOLN: INTRAMUSCULAR | 0 refills | Status: DC

## 2021-08-03 MED ORDER — DEXCOM G6 TRANSMITTER MISC: 3 refills | Status: DC

## 2021-08-03 MED ORDER — BD PEN NEEDLE MICRO U/F 32G X 6 MM MISC: 3 refills | Status: DC

## 2021-08-03 MED ORDER — MELOXICAM 15 MG PO TABS: 15.0000 mg | ORAL_TABLET | Freq: Every day | ORAL | 0 refills | Status: DC

## 2021-08-03 MED ORDER — TIRZEPATIDE 2.5 MG/0.5ML ~~LOC~~ SOAJ: 2.5000 mg | SUBCUTANEOUS | 1 refills | Status: DC

## 2021-08-03 MED ORDER — DEXCOM G6 SENSOR MISC: 0 refills | Status: DC

## 2021-08-06 ENCOUNTER — Other Ambulatory Visit: Payer: Self-pay | Admitting: Internal Medicine

## 2021-08-06 DIAGNOSIS — E1165 Type 2 diabetes mellitus with hyperglycemia: Secondary | ICD-10-CM

## 2021-08-07 ENCOUNTER — Other Ambulatory Visit: Payer: Self-pay

## 2021-08-07 DIAGNOSIS — E1165 Type 2 diabetes mellitus with hyperglycemia: Secondary | ICD-10-CM

## 2021-08-07 MED ORDER — TIRZEPATIDE 2.5 MG/0.5ML ~~LOC~~ SOAJ: 2.5000 mg | SUBCUTANEOUS | 1 refills | Status: DC

## 2021-08-07 MED ORDER — TOUJEO MAX SOLOSTAR 300 UNIT/ML ~~LOC~~ SOPN
PEN_INJECTOR | SUBCUTANEOUS | 2 refills | Status: DC
Start: 2021-08-07 — End: 2021-08-15

## 2021-08-07 MED ORDER — INSULIN ASPART 100 UNIT/ML IJ SOLN: INTRAMUSCULAR | 0 refills | Status: DC

## 2021-08-07 MED ORDER — DEXCOM G6 SENSOR MISC: 0 refills | Status: DC

## 2021-08-07 MED ORDER — DEXCOM G6 TRANSMITTER MISC: 3 refills | Status: DC

## 2021-08-07 NOTE — Telephone Encounter (Signed)
Medication resent to pharmacy per patient they didn't receive from 08/03/21. ?

## 2021-08-13 ENCOUNTER — Other Ambulatory Visit: Payer: Self-pay

## 2021-08-13 ENCOUNTER — Ambulatory Visit: Payer: BC Managed Care – PPO | Admitting: Podiatry

## 2021-08-13 DIAGNOSIS — M778 Other enthesopathies, not elsewhere classified: Secondary | ICD-10-CM

## 2021-08-13 MED ORDER — MELOXICAM 15 MG PO TABS: 15.0000 mg | ORAL_TABLET | Freq: Every day | ORAL | 2 refills | Status: DC

## 2021-08-13 MED ORDER — BETAMETHASONE SOD PHOS & ACET 6 (3-3) MG/ML IJ SUSP
3.0000 mg | Freq: Once | INTRAMUSCULAR | Status: AC
  Administered 2021-08-13: 3 mg via INTRA_ARTICULAR

## 2021-08-13 NOTE — Progress Notes (Signed)
? ?  HPI: 56 y.o. female presenting today for new complaint regarding pain and tenderness to the right foot this been going on for few months now.  She denies a history of injury.  She was prescribed some meloxicam to help alleviate the pain which has helped minimally.  Patient also would like to have her unequal limb length discussed today.  Patient states that this has been ever since she was a young child.  She has had orthotics/insoles made in the past but they were too bulky to put in her shoes.  She presents for further treatment and evaluationShe presents for further treatment and evaluation ? ?Past Medical History:  ?Diagnosis Date  ? Arthritis   ? Diabetes mellitus without complication ([place])   ? type 2  ? Thyroid disease   ? ?  ?Physical Exam: ?General: The patient is alert and oriented x3 in no acute distress. ? ?Dermatology: Skin is warm, dry and supple bilateral lower extremities. Negative for open lesions or macerations. ? ?Vascular: Palpable pedal pulses bilaterally. No edema or erythema noted. Capillary refill within normal limits. ? ?Neurological: Epicritic and protective threshold grossly intact bilaterally.  ? ?Musculoskeletal Exam: Range of motion within normal limits to all pedal and ankle joints bilateral. Muscle strength 5/5 in all groups bilateral.  Pain on palpation to the first TMT/Lisfranc joint right ?Today also upon evaluation the patient does exhibit an unequal limb length with the right lower extremity shorter than the left.  ?Assessment: ?1.  TMT capsulitis right foot ?2.  Unequal limb length. RT shorter than LT ?3.  Diabetes mellitus ? ? ?Plan of Care:  ?1. Patient evaluated. X-Rays reviewed.  ?2.  Injection of 0.5 cc Celestone Soluspan injection of the TMT joint right foot ?3.  Continue wearing good supportive shoes and sneakers ?4.  Refill prescription for meloxicam 15 mg daily ?5.  Appointment with Pedorthist for custom molded diabetic shoes and insoles with possible heel lift  RLE. ?6.  Return to clinic as needed ? ?  ?  ?Edrick Kins, DPM ?[place] ? ?Dr. Edrick Kins, DPM  ?  ?[address]                                        ?[place], [place] [address]                ?Office [phone]  ?Fax [phone] ? ? ? ? ?

## 2021-08-14 ENCOUNTER — Ambulatory Visit: Payer: BC Managed Care – PPO

## 2021-08-14 DIAGNOSIS — G4733 Obstructive sleep apnea (adult) (pediatric): Secondary | ICD-10-CM

## 2021-08-14 DIAGNOSIS — R0683 Snoring: Secondary | ICD-10-CM

## 2021-08-15 ENCOUNTER — Telehealth: Payer: Self-pay | Admitting: Pulmonary Disease

## 2021-08-15 ENCOUNTER — Encounter: Payer: Self-pay | Admitting: Internal Medicine

## 2021-08-15 ENCOUNTER — Other Ambulatory Visit: Payer: Self-pay

## 2021-08-15 ENCOUNTER — Ambulatory Visit: Payer: BC Managed Care – PPO | Admitting: Internal Medicine

## 2021-08-15 VITALS — HR 92 | Ht 68.0 in | Wt 266.0 lb

## 2021-08-15 DIAGNOSIS — R739 Hyperglycemia, unspecified: Secondary | ICD-10-CM

## 2021-08-15 DIAGNOSIS — E1142 Type 2 diabetes mellitus with diabetic polyneuropathy: Secondary | ICD-10-CM

## 2021-08-15 DIAGNOSIS — E890 Postprocedural hypothyroidism: Secondary | ICD-10-CM

## 2021-08-15 DIAGNOSIS — E039 Hypothyroidism, unspecified: Secondary | ICD-10-CM

## 2021-08-15 DIAGNOSIS — G4733 Obstructive sleep apnea (adult) (pediatric): Secondary | ICD-10-CM | POA: Diagnosis not present

## 2021-08-15 DIAGNOSIS — Z794 Long term (current) use of insulin: Secondary | ICD-10-CM | POA: Diagnosis not present

## 2021-08-15 DIAGNOSIS — E1165 Type 2 diabetes mellitus with hyperglycemia: Secondary | ICD-10-CM

## 2021-08-15 LAB — POCT GLYCOSYLATED HEMOGLOBIN (HGB A1C): Hemoglobin A1C: 11.2 % — AB (ref 4.0–5.6)

## 2021-08-15 MED ORDER — BD PEN NEEDLE MICRO U/F 32G X 6 MM MISC: 1.0000 | Freq: Four times a day (QID) | 3 refills | Status: DC

## 2021-08-15 MED ORDER — NOVOLOG FLEXPEN 100 UNIT/ML ~~LOC~~ SOPN: PEN_INJECTOR | SUBCUTANEOUS | 6 refills | Status: DC

## 2021-08-15 MED ORDER — FREESTYLE LIBRE 2 SENSOR MISC: 1.0000 | 3 refills | Status: DC

## 2021-08-15 MED ORDER — CONTOUR NEXT TEST VI STRP: 1.0000 | ORAL_STRIP | 3 refills | Status: DC

## 2021-08-15 MED ORDER — TOUJEO MAX SOLOSTAR 300 UNIT/ML ~~LOC~~ SOPN: 30.0000 [IU] | PEN_INJECTOR | Freq: Every day | SUBCUTANEOUS | 3 refills | Status: DC

## 2021-08-15 MED ORDER — LEVOTHYROXINE SODIUM 175 MCG PO TABS: 175.0000 ug | ORAL_TABLET | Freq: Every day | ORAL | 3 refills | Status: DC

## 2021-08-15 MED ORDER — TIRZEPATIDE 5 MG/0.5ML ~~LOC~~ SOAJ: 5.0000 mg | SUBCUTANEOUS | 3 refills | Status: DC

## 2021-08-15 NOTE — Patient Instructions (Addendum)
-   Increase  Mounjaro 5 mg weekly  ?- Decrease  Toujeo 30  units daily  ?- Novolog 15-17 units three times a day before each meal  ?- Novolog correctional insulin: ADD extra units on insulin to your meal-time Novolog dose if your blood sugars are higher than 155 . Use the scale below to help guide you:  ? ?Blood sugar before meal Number of units to inject  ?Less than 155 0 unit  ?156 -  180 1 units  ?181 -  205 2 units  ?206 -  230 3 units  ?231 -  255 4 units  ?256 -  280 5 units  ?281 -  305 6 units  ?306 -  330 7 units  ?331 -  355 8 units  ?356 - 380 9 units   ? ? ? ? ? ? ?HOW TO TREAT LOW BLOOD SUGARS (Blood sugar LESS THAN 70 MG/DL) ?Please follow the RULE OF 15 for the treatment of hypoglycemia treatment (when your (blood sugars are less than 70 mg/dL)  ? ?STEP 1: Take 15 grams of carbohydrates when your blood sugar is low, which includes:  ?3-4 GLUCOSE TABS  OR ?3-4 OZ OF JUICE OR REGULAR SODA OR ?ONE TUBE OF GLUCOSE GEL   ? ?STEP 2: RECHECK blood sugar in 15 MINUTES ?STEP 3: If your blood sugar is still low at the 15 minute recheck --> then, go back to STEP 1 and treat AGAIN with another 15 grams of carbohydrates. ? ? ? ? ?

## 2021-08-15 NOTE — Telephone Encounter (Signed)
ATC patient.  LMTCB. 

## 2021-08-15 NOTE — Progress Notes (Signed)
? ?Name: Tanya Graves  ?Age/ Sex: 56 y.o., female   ?MRN/ DOB: [id number], 03-12-1966    ? ?PCP: [name], MD   ?Reason for Endocrinology Evaluation: Type 2 Diabetes Mellitus  ?Initial Endocrine Consultative Visit: 07/08/2018  ? ? ?PATIENT IDENTIFIER: Tanya Graves is a 56 y.o. female with a past medical history of T2DM, HTN, Dyslipidemia and hypothyroidism . The patient has followed with Endocrinology clinic since 07/08/2018 for consultative assistance with management of her diabetes. ? ?DIABETIC HISTORY:  ?Ms. Nayak was diagnosed with T2DM ~ 2010.  She does not recall intolerance to other oral glycemic agents.  On her initial visit here she was on MDI regimen of insulin and metformin. Her hemoglobin A1c has ranged from 11.4%  in 2016, peaking at 12.5% in 2019 ? ?GLP-1 and SGLT-2 inhibitors have been cost prohibitive  ? ? ?Thyroid Disease ?  ?She is S/P RAI ablation years ago > 20 yrs ago and has been on levothyroxine . She is on Levothyroxine 175 mcg daily  ? ?SUBJECTIVE:  ? ?During the last visit (04/16/2021): A1c 10.8% . We restarted Metformin, and adjusted MDI regimen  ? ? ? ? ?Today (08/15/2021): Ms. Runnion for a follow-up on diabetes management.  She checks her blood sugars 3-6  times daily. Dexcom  become cost prohibitive . The patient has had hypoglycemic episodes since the last clinic visit. She is making a conscious effort to take prandial insulin befoe she eats  ? ?  ? ?Over the past few months she has had multiple family losses and has been very stressed out.  ?She had an ED visit 11/2020 PE/DVT ruled out , was noted with hyperglycemia , metformin was held  ? ? ?She has right foot tendonitis , sees  podiatry , has been getting glucocorticoid injections  ? ? ?She is on coupon for mounjaro through [facility]  ?Would like to try freestyle libre  ?She is getting genital boils  ? ?She has been off metformin since 02/2021 ?Had a sleep study yesterday  ?Denies nausea, vomiting or diarrhea with Mounjaro   ?She is having a dietician over the phone  ? ?HOME DIABETES REGIMEN:  ?Metformin 1000 mg twice daily with meals ?Toujeo 24 units daily - 34 units daily  ?Novolog  05/07/14 units TIDQAC- takes 15-17 units  ?Continue  CF : Novolog ( BG-130/25) - has not been using it for 3 weeks  ? ? ? ?CONTINUOUS GLUCOSE MONITORING RECORD INTERPRETATION   ? ?Dates of Recording: 2/28-3/13/2023 ? ?Sensor description: Dexcom ? ?Results statistics: ?  ?CGM use % of time 100  ?Average and SD 276/106  ?Time in range  24%  ?% Time Above 180 16  ?% Time above 250 59  ?% Time Below target <1  ? ? ?Glycemic patterns summary: Hyperglycemia during the day and night  ? ?Hyperglycemic episodes postprandial ? ?Hypoglycemic episodes occurred variable  ? ? ?METER DOWNLOAD SUMMARY: 2/23-3/22/2023 ?Fingerstick Blood Glucose Tests = 19 ?Overall Mean FS Glucose = 235 ?Standard Deviation = 147 ? ?BG Ranges: ?Low = 48 ?High = 506 ? ? ? ?Hypoglycemic Events/30 Days: ?BG < 50 = 1 ?Episodes of symptomatic severe hypoglycemia = 0 ? ? ?DIABETIC COMPLICATIONS: ?Microvascular complications:  ?Neuropathy , worsening DR ?Denies: CKD  ?Last eye exam: Completed  07/17/2020 ?  ?Macrovascular complications:  ?  ?Denies: CAD, PVD, CVA ? ? ? ?HISTORY:  ?Past Medical History:  ?Past Medical History:  ?Diagnosis Date  ? Arthritis   ? Diabetes  mellitus without complication ([place])   ? type 2  ? Thyroid disease   ? ?Past Surgical History:  ?Past Surgical History:  ?Procedure Laterality Date  ? APPENDECTOMY  1993  ? CALCANEAL OSTEOTOMY Left 07/17/2016  ? Procedure: CALCANEAL OSTEOTOMY;  Surgeon: [name], MD;  Location: [facility] ORS;  Service: Orthopedics;  Laterality: Left;  ? COLONOSCOPY  12/18/2018  ? found 1 45m polyp, going back in 5 years   ? COLONOSCOPY WITH PROPOFOL N/A 12/18/2018  ? Procedure: COLONOSCOPY WITH PROPOFOL;  Surgeon: AJonathon Bellows MD;  [facility];  Service: Gastroenterology;  Laterality: N/A;  ? IRRIGATION AND DEBRIDEMENT ABSCESS N/A 01/24/2015   ? Procedure: IRRIGATION AND DEBRIDEMENT ABSCESS/GROIN ABSCESS;  Surgeon: CMarlyce Huge MD;  Location: [facility] ORS;  Service: General;  Laterality: N/A;  ? KNEE ARTHROSCOPY W/ MENISCAL REPAIR Right 2008  ? ROTATOR CUFF REPAIR Right 2015  ? TONSILLECTOMY  1998  ? ?Social History:  reports that she has been smoking cigarettes. She has never used smokeless tobacco. She reports current alcohol use. She reports that she does not use drugs. ?Family History:  ?Family History  ?Problem Relation Age of Onset  ? Diabetes Father   ? Heart failure Father   ? Diabetes Brother   ? Arthritis Mother   ? Diabetes Mother   ? Transient ischemic attack Mother   ? Heart failure Maternal Grandmother   ? Alcohol abuse Maternal Grandmother   ? Diabetes Paternal Grandmother   ? Kidney failure Paternal Grandmother   ? Heart failure Paternal Grandfather   ? Cancer Paternal Grandfather   ?     unknown type  ? Hypertension Brother   ? Breast cancer Maternal Great-grandmother   ? ? ?OBJECTIVE:  ? ?Vital Signs: Pulse 92   Ht '5\' 8"'$  (1.727 m)   Wt 266 lb (120.7 kg)   LMP 06/28/2015 (Approximate)   SpO2 99%   BMI 40.45 kg/m?   ?Wt Readings from Last 3 Encounters:  ?08/15/21 266 lb (120.7 kg)  ?06/20/21 273 lb 9.6 oz (124.1 kg)  ?04/16/21 289 lb (131.1 kg)  ? ? ? ?Exam: ?General: Pt appears well and is in NAD  ?Lungs: Clear with good BS bilat   ?Heart: RRR   ?Extremities: No pretibial edema.   ?Neuro: MS is good with appropriate affect, pt is alert and Ox3  ? ?DM foot exam: 08/15/2021 ?The skin of the feet is intact without sores or ulcerations. ?The pedal pulses are 2+ on right and 2+ on left. ?The sensation is intact to a screening 5.07, 10 gram monofilament bilaterally ? ? ? ? ?DATA REVIEWED: ? ?Lab Results  ?Component Value Date  ? HGBA1C 11.2 (A) 08/15/2021  ? HGBA1C 10.8 (A) 04/16/2021  ? HGBA1C 10.8 (A) 10/20/2020  ? ? Latest Reference Range & Units 04/16/21 16:08  ?Sodium 135 - 145 mEq/L 139  ?Potassium 3.5 - 5.1 mEq/L 4.8   ?Chloride 96 - 112 mEq/L 103  ?CO2 19 - 32 mEq/L 27  ?Glucose 70 - 99 mg/dL 101 (H)  ?BUN 6 - 23 mg/dL 20  ?Creatinine 0.40 - 1.20 mg/dL 0.81  ?Calcium 8.4 - 10.5 mg/dL 9.4  ?GFR >60.00 mL/min 82.37  ?Total CHOL/HDL Ratio  3  ?Cholesterol 0 - 200 mg/dL 176  ?HDL Cholesterol >39.00 mg/dL 66.70  ?LDL (calc) 0 - 99 mg/dL 80  ?MICROALB/CREAT RATIO 0.0 - 30.0 mg/g 1.6  ?NonHDL  109.54  ?Triglycerides 0.0 - 149.0 mg/dL 146.0  ?VLDL 0.0 - 40.0 mg/dL 29.2  ?(H):  Data is abnormally high ? ? Latest Reference Range & Units 04/16/21 16:08  ?Glucose 70 - 99 mg/dL 101 (H)  ?TSH 0.35 - 5.50 uIU/mL 1.78  ? ? ? ? ? ?ASSESSMENT / PLAN / RECOMMENDATIONS:  ?1)Type 2 Diabetes Mellitus, Poorly controlled, With Neuropathic and retinopathic complications - Most recent A1c of 11.2 %. Goal A1c < 7.0 %.  ? ? ? ?-Unfortunately despite her efforts with diet and medication adherence her A1c continues to trend upwards.  We discussed that part of this is the glucocorticoid injections that she has been getting ?-Dexcom has become  cost prohibitive, will try freestyle libre, cautioned patient about some inaccuracy with freestyle libre and to confirm readings with fingersticks at least the first day or 2 after applying a new sensor ?- Historically SGLT-2 inhibitors ( farxiga in 2020) and GLP-1 agonists ( Rybelsus in 2021) have been cost prohibitive  ?- Per pt insulin pump cost-prohibitive in the past  ?-She is able to obtain Mounjaro through coupons, will increase as below ?-She had a BG reading of 48 mg/DL, but this is because she ate 50% less of her regular lunch ?-I will reduce her basal insulin as below as she continues to take more than previously prescribed ?-She will be provided with a new correction scale as she lost the last 1 ?-I would not change her prandial dose of insulin but she understands that if she forgets to take her prandial dose with a meal, she should not take it later as this will increase her risk of hypoglycemia but rather  use correction scale instead ?-Intolerant to metformin ? ?MEDICATIONS: ?Increase Mounjaro 5 mg weekly  ?Decrease Toujeo 30 units daily  ?Continue NovoLog  15-17 units 3 times daily before every meal ?Continue  CF :

## 2021-08-15 NOTE — Telephone Encounter (Signed)
HST showed mild  OSA with AHI 7/ hr ?This may be because she did not sleep much on her back at all ? ?I feel her OSA is much worse than appears on this study ? ?Suggest - split night study in sleep lab ? ? ? ?

## 2021-08-16 ENCOUNTER — Encounter: Payer: Self-pay | Admitting: Internal Medicine

## 2021-08-16 ENCOUNTER — Telehealth: Payer: Self-pay | Admitting: Pharmacy Technician

## 2021-08-16 ENCOUNTER — Other Ambulatory Visit (HOSPITAL_COMMUNITY): Payer: Self-pay

## 2021-08-16 NOTE — Telephone Encounter (Signed)
Patient Advocate Encounter ? ?Received notification from [place] that prior authorization for [facility] '5MG'$  is required. ?  ?PA submitted on 3.23.23 ?Key [id number] ?Status is pending ?  ?[facility] will continue to follow ? ?Liv Rallis R Lateef Juncaj, CPhT ?Patient Advocate ?[facility] ?Phone: [phone] ?Fax:  [phone] ? ?

## 2021-08-16 NOTE — Telephone Encounter (Signed)
Called patient. She is in work meeting and will return call.  ?

## 2021-08-16 NOTE — Telephone Encounter (Signed)
Patient is returning phone call. Patient phone number is [phone]. ?

## 2021-08-16 NOTE — Telephone Encounter (Signed)
Called and spoke with patient. I explained to her the option of the cpap machine. She is ok with having the cpap machine but is scared about the cost and the thought of having a mask on her face at night.  ? ?She wanted to know if she would be a candidate for an oral appliance?  ? ?RA, can you please advise? Thanks!  ?

## 2021-08-16 NOTE — Telephone Encounter (Signed)
Called and spoke to patient. She states she is willing to do in lab study but can't for childcare purposes. She is a new foster parent to a long term placement and cannot do an overnight study in a lab.  ? ?Dr. Elsworth Soho please advise   ?

## 2021-08-17 DIAGNOSIS — R0683 Snoring: Secondary | ICD-10-CM

## 2021-08-17 NOTE — Telephone Encounter (Signed)
Called and spoke to patient. She voiced understanding about recs for dentist. States she will go to her own dentist in April and get oral appliance questions answered then.Patient requested   document of sleep study to be sent to her in [place].  ? ?She requested to speak to someone In our office who handles insurance and deals with CPAP orders.  ?PCCs can you talk to the patient?  ?

## 2021-08-21 NOTE — Telephone Encounter (Signed)
I called & spoke to pt.  I told her dme companies that provide cpap do all of the billing with insurance.  She is going to see her dentist next month and find out if her dentist does the oral appliance.  She will let us know if she decides she wants mouth guard or if she wants cpap.  Nothing further needed at this time.   ?

## 2021-08-22 ENCOUNTER — Encounter: Payer: Self-pay | Admitting: Internal Medicine

## 2021-08-23 ENCOUNTER — Telehealth: Payer: Self-pay | Admitting: Internal Medicine

## 2021-08-23 MED ORDER — TOUJEO MAX SOLOSTAR 300 UNIT/ML ~~LOC~~ SOPN: 24.0000 [IU] | PEN_INJECTOR | Freq: Every day | SUBCUTANEOUS | 3 refills | Status: DC

## 2021-08-23 NOTE — Telephone Encounter (Signed)
?  Spoke to Tanya Graves about her portal message regarding hypoglycemia ? ?Patient has been waking up in the middle of the night around 2 AM with BG's in the 40's  ? ? ?She is currently using freestyle libre ? ?Decrease Toujeo 24 units daily ?

## 2021-08-27 ENCOUNTER — Encounter: Payer: Self-pay | Admitting: Family Medicine

## 2021-08-27 ENCOUNTER — Ambulatory Visit (INDEPENDENT_AMBULATORY_CARE_PROVIDER_SITE_OTHER): Payer: BC Managed Care – PPO | Admitting: Family Medicine

## 2021-08-27 DIAGNOSIS — G4733 Obstructive sleep apnea (adult) (pediatric): Secondary | ICD-10-CM | POA: Insufficient documentation

## 2021-08-27 NOTE — Progress Notes (Signed)
Annual Exam  ? ?Chief Complaint:  ?Chief Complaint  ?Patient presents with  ? Annual Exam  ?  Eye appt, pap and mammo are all scheduled.  ? ? ?History of Present Illness:  ?[name] is a 56 y.o. G1P0010 who LMP was Patient's last menstrual period was 06/28/2015 (approximate)., presents today for her annual examination.   ? ? ? ?Nutrition ?She does get adequate calcium and Vitamin D in her diet. ?Diet: mounjaro for DM is helping, diabetic diet - working with a dietician ?Exercise: walking ? ? ? ?Social History  ? ?Tobacco Use  ?Smoking Status Every Day  ? Packs/day: 0.25  ? Years: 30.00  ? Pack years: 7.50  ? Types: Cigarettes  ?Smokeless Tobacco Never  ?Tobacco Comments  ? 1 pack a week  ? ?Social History  ? ?Substance and Sexual Activity  ?Alcohol Use Yes  ? Comment: a couple of times a month, typically less than 2 drinks  ? ?Social History  ? ?Substance and Sexual Activity  ?Drug Use No  ? ? ? ?General Health ?Dentist in the last year: Yes ?Eye doctor: yes ? ?Safety ?The patient wears seatbelts: yes.     ?The patient feels safe at home and in their relationships: yes. ? ? ?Menstrual:  ?Symptoms of menopause: no symptoms - vaginal dryness - topical estrogen ? ?GYN ?She is not sexually active.  ? ? ?Cervical Cancer Screening (21-65):   ?Last Pap:   January 2022 Results were: ASCUS with POSITIVE high risk HPV  ? ?Breast Cancer Screening (Age 43-74):  ?There is no FH of breast cancer. There is no FH of ovarian cancer. BRCA screening Not Indicated.  ?Last Mammogram: 04/2020 ?The patient does want a mammogram this year.  ? ? ?Colon Cancer Screening:  ?Age 28-75 yo - benefits outweigh the risk. Adults 83-85 yo who have never been screened benefit.  ?Benefits: 134000 people in 2016 will be diagnosed and 49,000 will die - early detection helps ?[place]: Complications 2/2 to colonoscopy ?High Risk (Colonoscopy): genetic disorder (Lynch syndrome or familial adenomatous polyposis), personal hx of IBD, previous  adenomatous polyp, or previous colorectal cancer, FamHx start 10 years before the age at diagnosis, increased in males and black race ? ?Options:  ?FIT - looks for hemoglobin (blood in the stool) - specific and fairly sensitive - must be done annually ?Cologuard - looks for DNA and blood - more sensitive - therefore can have more false positives, every 3 years ?Colonoscopy - every 10 years if normal - sedation, bowl prep, must have someone drive you ? ?Shared decision making and the patient had decided to do Colonoscopy 2020 - due in 2025. ? ? ?Social History  ? ?Tobacco Use  ?Smoking Status Every Day  ? Packs/day: 0.25  ? Years: 30.00  ? Pack years: 7.50  ? Types: Cigarettes  ?Smokeless Tobacco Never  ?Tobacco Comments  ? 1 pack a week  ? ? ? ?Lung Cancer Screening (Ages 48-25): not applicable ?20 year pack history? No ?Current Tobacco user? Yes ?Quit less than 15 years ago? No ?Interested in low dose CT for lung cancer screening? not applicable ? ?Weight ?Wt Readings from Last 3 Encounters:  ?08/27/21 259 lb 1 oz (117.5 kg)  ?08/15/21 266 lb (120.7 kg)  ?06/20/21 273 lb 9.6 oz (124.1 kg)  ? ?Patient has high BMI  ?BMI Readings from Last 1 Encounters:  ?08/27/21 39.39 kg/m?  ? ? ? ?Chronic disease screening ?Blood pressure monitoring:  ?BP Readings from  Last 3 Encounters:  ?08/27/21 126/78  ?06/20/21 126/82  ?04/16/21 130/80  ? ? ?Lipid Monitoring: Indication for screening: age >21, obesity, diabetes, family hx, CV risk factors.  ?Lipid screening: Yes ? ?Lab Results  ?Component Value Date  ? CHOL 176 04/16/2021  ? HDL 66.70 04/16/2021  ? [place] 80 04/16/2021  ? LDLDIRECT 88.0 03/25/2019  ? TRIG 146.0 04/16/2021  ? CHOLHDL 3 04/16/2021  ? ? ? ?Diabetes Screening: age >85, overweight, family hx, PCOS, hx of gestational diabetes, at risk ethnicity ?Diabetes Screening screening: Yes ? ?Lab Results  ?Component Value Date  ? HGBA1C 11.2 (A) 08/15/2021  ? ? ? ?Past Medical History:  ?Diagnosis Date  ? Arthritis   ?  Diabetes mellitus without complication ([place])   ? type 2  ? Thyroid disease   ? ? ?Past Surgical History:  ?Procedure Laterality Date  ? APPENDECTOMY  1993  ? CALCANEAL OSTEOTOMY Left 07/17/2016  ? Procedure: CALCANEAL OSTEOTOMY;  Surgeon: [name], MD;  Location: [facility] ORS;  Service: Orthopedics;  Laterality: Left;  ? COLONOSCOPY  12/18/2018  ? found 1 45m polyp, going back in 5 years   ? COLONOSCOPY WITH PROPOFOL N/A 12/18/2018  ? Procedure: COLONOSCOPY WITH PROPOFOL;  Surgeon: AJonathon Bellows MD;  [facility];  Service: Gastroenterology;  Laterality: N/A;  ? IRRIGATION AND DEBRIDEMENT ABSCESS N/A 01/24/2015  ? Procedure: IRRIGATION AND DEBRIDEMENT ABSCESS/GROIN ABSCESS;  Surgeon: CMarlyce Huge MD;  Location: [facility] ORS;  Service: General;  Laterality: N/A;  ? KNEE ARTHROSCOPY W/ MENISCAL REPAIR Right 2008  ? ROTATOR CUFF REPAIR Right 2015  ? TONSILLECTOMY  1998  ? ? ?Prior to Admission medications   ?Medication Sig Start Date End Date Taking? Authorizing Provider  ?atorvastatin (LIPITOR) 20 MG tablet Take 1 tablet (20 mg total) by mouth daily. 04/18/21  Yes Shamleffer, IMelanie Crazier MD  ?BD INSULIN SYRINGE U/F 1/2UNIT 31G X 5/16" 0.3 ML MISC USE WITH NOVOLOG THREE TIMES A DAY 05/08/21  Yes Shamleffer, IMelanie Crazier MD  ?Continuous Blood Gluc Sensor (FREESTYLE LIBRE 2 SENSOR) MISC 1 Device by Does not apply route every 14 (fourteen) days. 08/15/21  Yes Shamleffer, IMelanie Crazier MD  ?glucose blood (CONTOUR NEXT TEST) test strip 1 each by Other route as directed. 6x daily due to hypoglycemia 08/15/21  Yes Shamleffer, IMelanie Crazier MD  ?insulin glargine, 2 Unit Dial, (TOUJEO MAX SOLOSTAR) 300 UNIT/ML Solostar Pen Inject 24 Units into the skin daily in the afternoon. 08/23/21  Yes Shamleffer, IMelanie Crazier MD  ?Insulin Pen Needle (BD PEN NEEDLE MICRO U/F) 32G X 6 MM MISC Inject 1 Device into the skin in the morning, at noon, in the evening, and at bedtime. 4x a day 08/15/21  Yes Shamleffer,  IMelanie Crazier MD  ?levothyroxine (SYNTHROID) 175 MCG tablet Take 1 tablet (175 mcg total) by mouth daily with breakfast. 08/15/21  Yes Shamleffer, IMelanie Crazier MD  ?meloxicam (MOBIC) 15 MG tablet Take 1 tablet (15 mg total) by mouth daily. 08/13/21  Yes EEdrick Kins DPM  ?NOVOLOG 100 UNIT/ML injection Inject 60 Units into the skin daily. 08/19/21  Yes [provider]  ?tirzepatide (Darcel Bayley 5 MG/0.5ML Pen Inject 5 mg into the skin once a week. 08/15/21  Yes Shamleffer, IMelanie Crazier MD  ? ? ?No Known Allergies ? ?Gynecologic History: Patient's last menstrual period was 06/28/2015 (approximate). ? ?Obstetric History: G1P0010 ? ?Social History  ? ?Socioeconomic History  ? Marital status: Single  ?  Spouse name: Not on file  ? Number of children:  0  ? Years of education: Associates  ? Highest education level: Not on file  ?Occupational History  ? Not on file  ?Tobacco Use  ? Smoking status: Every Day  ?  Packs/day: 0.25  ?  Years: 30.00  ?  Pack years: 7.50  ?  Types: Cigarettes  ? Smokeless tobacco: Never  ? Tobacco comments:  ?  1 pack a week  ?Vaping Use  ? Vaping Use: Former  ?Substance and Sexual Activity  ? Alcohol use: Yes  ?  Comment: a couple of times a month, typically less than 2 drinks  ? Drug use: No  ? Sexual activity: Not Currently  ?  Birth control/protection: Post-menopausal  ?Other Topics Concern  ? Not on file  ?Social History Narrative  ? Lives alone  ? Dog - Sugar  ? Enjoys: hiking - looking for local trails  ? Exercise: walking trails with dog  ? Diet: doing well, did weight watchers, watching carbs  ? ?Social Determinants of Health  ? ?Financial Resource Strain: Not on file  ?Food Insecurity: Not on file  ?Transportation Needs: Not on file  ?Physical Activity: Not on file  ?Stress: Not on file  ?Social Connections: Not on file  ?Intimate Partner Violence: Not on file  ? ? ?Family History  ?Problem Relation Age of Onset  ? Diabetes Father   ? Heart failure Father   ? Diabetes  Brother   ? Arthritis Mother   ? Diabetes Mother   ? Transient ischemic attack Mother   ? Heart failure Maternal Grandmother   ? Alcohol abuse Maternal Grandmother   ? Diabetes Paternal Grandmother   ? Kidney fa

## 2021-08-27 NOTE — Patient Instructions (Addendum)
Set Boundaries, Find Peace -- Book to read - has a section on workplace boundaries ? ?Drinking 64 oz of water a day ?If you drink caffeine - drink an equal part water ?

## 2021-08-28 NOTE — Telephone Encounter (Signed)
Received a fax regarding Prior Authorization from [facility]   for [facility] 2.'5MG'$ . Authorization has been DENIED because, pt has not tried ALL the preferred medications: ?Ozempic, Rybelsus, Trulity, AND Victoza. ? ? ? ?

## 2021-09-11 ENCOUNTER — Ambulatory Visit: Payer: BC Managed Care – PPO | Admitting: Obstetrics and Gynecology

## 2021-10-03 ENCOUNTER — Ambulatory Visit (INDEPENDENT_AMBULATORY_CARE_PROVIDER_SITE_OTHER): Payer: BC Managed Care – PPO | Admitting: Obstetrics and Gynecology

## 2021-10-03 ENCOUNTER — Encounter: Payer: Self-pay | Admitting: Obstetrics and Gynecology

## 2021-10-03 ENCOUNTER — Other Ambulatory Visit (HOSPITAL_COMMUNITY)
Admission: RE | Admit: 2021-10-03 | Discharge: 2021-10-03 | Disposition: A | Discharge status: Home / Self Care | Payer: BC Managed Care – PPO | Source: Ambulatory Visit | Attending: Obstetrics and Gynecology | Admitting: Obstetrics and Gynecology

## 2021-10-03 VITALS — BP 120/80 | Ht 68.0 in | Wt 250.0 lb

## 2021-10-03 DIAGNOSIS — Z01419 Encounter for gynecological examination (general) (routine) without abnormal findings: Secondary | ICD-10-CM | POA: Diagnosis not present

## 2021-10-03 NOTE — Progress Notes (Signed)
Subjective:  ?  ? Tanya Graves is a 56 y.o. female P0 postmenopausal with BMI 38 who is here for a comprehensive physical exam. The patient reports no problems. She denies any episodes of postmenopausal vaginal bleeding. She is not sexually active. She denies pelvic pain or abnormal discharge. Patient denies vasomotor symptoms. She denies urinary incontinence and reports regular bowel movements. She is actively losing weight through diet and exercise ? ?Past Medical History:  ?Diagnosis Date  ? Arthritis   ? Diabetes mellitus without complication ([place])   ? type 2  ? Thyroid disease   ? ?Past Surgical History:  ?Procedure Laterality Date  ? APPENDECTOMY  1993  ? CALCANEAL OSTEOTOMY Left 07/17/2016  ? Procedure: CALCANEAL OSTEOTOMY;  Surgeon: [name], MD;  Location: [facility] ORS;  Service: Orthopedics;  Laterality: Left;  ? COLONOSCOPY  12/18/2018  ? found 1 75m polyp, going back in 5 years   ? COLONOSCOPY WITH PROPOFOL N/A 12/18/2018  ? Procedure: COLONOSCOPY WITH PROPOFOL;  Surgeon: AJonathon Bellows MD;  [facility];  Service: Gastroenterology;  Laterality: N/A;  ? IRRIGATION AND DEBRIDEMENT ABSCESS N/A 01/24/2015  ? Procedure: IRRIGATION AND DEBRIDEMENT ABSCESS/GROIN ABSCESS;  Surgeon: CMarlyce Huge MD;  Location: [facility] ORS;  Service: General;  Laterality: N/A;  ? KNEE ARTHROSCOPY W/ MENISCAL REPAIR Right 2008  ? ROTATOR CUFF REPAIR Right 2015  ? TONSILLECTOMY  1998  ? ?Family History  ?Problem Relation Age of Onset  ? Diabetes Father   ? Heart failure Father   ? Diabetes Brother   ? Arthritis Mother   ? Diabetes Mother   ? Transient ischemic attack Mother   ? Heart failure Maternal Grandmother   ? Alcohol abuse Maternal Grandmother   ? Diabetes Paternal Grandmother   ? Kidney failure Paternal Grandmother   ? Heart failure Paternal Grandfather   ? Cancer Paternal Grandfather   ?     unknown type  ? Hypertension Brother   ? Breast cancer Maternal Great-grandmother   ? ? ? ?Social History   ? ?Socioeconomic History  ? Marital status: Single  ?  Spouse name: Not on file  ? Number of children: 0  ? Years of education: Associates  ? Highest education level: Not on file  ?Occupational History  ? Not on file  ?Tobacco Use  ? Smoking status: Every Day  ?  Packs/day: 0.25  ?  Years: 30.00  ?  Pack years: 7.50  ?  Types: Cigarettes  ? Smokeless tobacco: Never  ? Tobacco comments:  ?  1 pack a week  ?Vaping Use  ? Vaping Use: Former  ?Substance and Sexual Activity  ? Alcohol use: Yes  ?  Comment: a couple of times a month, typically less than 2 drinks  ? Drug use: No  ? Sexual activity: Not Currently  ?  Birth control/protection: Post-menopausal  ?Other Topics Concern  ? Not on file  ?Social History Narrative  ? Lives alone  ? Dog - Sugar  ? Enjoys: hiking - looking for local trails  ? Exercise: walking trails with dog  ? Diet: doing well, did weight watchers, watching carbs  ? ?Social Determinants of Health  ? ?Financial Resource Strain: Not on file  ?Food Insecurity: Not on file  ?Transportation Needs: Not on file  ?Physical Activity: Not on file  ?Stress: Not on file  ?Social Connections: Not on file  ?Intimate Partner Violence: Not on file  ? ?Health Maintenance  ?Topic Date Due  ? COVID-19 Vaccine (4 -  Booster for [employer] series) 06/28/2020  ? OPHTHALMOLOGY EXAM  07/26/2021  ? INFLUENZA VACCINE  12/25/2021  ? HEMOGLOBIN A1C  02/15/2022  ? URINE MICROALBUMIN  04/16/2022  ? MAMMOGRAM  04/28/2022  ? FOOT EXAM  08/28/2022  ? PAP SMEAR-Modifier  05/31/2023  ? COLONOSCOPY (Pts 45-32yr Insurance coverage will need to be confirmed)  12/18/2023  ? TETANUS/TDAP  03/24/2029  ? Hepatitis C Screening  Completed  ? HIV Screening  Completed  ? Zoster Vaccines- Shingrix  Completed  ? HPV VACCINES  Aged Out  ? ? ?  ? ?Review of Systems ?Pertinent items noted in HPI and remainder of comprehensive ROS otherwise negative.  ? ?Objective:  ?Blood pressure 120/80, height '5\' 8"'$  (1.727 m), weight 250 lb (113.4 kg), last menstrual  period 06/28/2015. ? ? GENERAL: Well-developed, well-nourished female in no acute distress.  ?HEENT: Normocephalic, atraumatic. Sclerae anicteric.  ?NECK: Supple. Normal thyroid.  ?LUNGS: Clear to auscultation bilaterally.  ?HEART: Regular rate and rhythm. ?BREASTS: Symmetric in size. No palpable masses or lymphadenopathy, skin changes, or nipple drainage. ?ABDOMEN: Soft, nontender, nondistended. No organomegaly. ?PELVIC: Normal external female genitalia. Vagina is pink and rugated.  Normal discharge. Normal appearing cervix. Uterus is normal in size. No adnexal mass or tenderness. Chaperone present during the pelvic exam ?EXTREMITIES: No cyanosis, clubbing, or edema, 2+ distal pulses. ?  ?  ?Assessment:  ? ? Healthy female exam.    ?  ?Plan:  ? ? Pap smear collected ?Screening mammogram ordered ?Patient will be contacted with abnormal results ?Encouraged patient to continue weight loss efforts ?See After Visit Summary for Counseling Recommendations  ? ?

## 2021-10-03 NOTE — Addendum Note (Signed)
Addended by: Mora Bellman on: 10/03/2021 10:03 AM ? ? Modules accepted: Orders ? ?

## 2021-10-05 ENCOUNTER — Ambulatory Visit (INDEPENDENT_AMBULATORY_CARE_PROVIDER_SITE_OTHER): Payer: BC Managed Care – PPO

## 2021-10-05 ENCOUNTER — Ambulatory Visit: Payer: BC Managed Care – PPO | Admitting: Podiatry

## 2021-10-05 DIAGNOSIS — M778 Other enthesopathies, not elsewhere classified: Secondary | ICD-10-CM

## 2021-10-05 NOTE — Progress Notes (Signed)
? ?  Subjective:  ?56 y.o. female presenting today for new complaint of pain to the right ankle when she sustained a trip and fall injury about an hour ago prior to presentation in a parking lot.  Patient states that she unexpectedly rolled her ankle and fell in the parking lot.  There was no hole or anything that would have elicited the ankle giving out on her.  She noticed immediate swelling.  She can bear weight on the foot.  She presents for further treatment and evaluation ? ? ?Past Medical History:  ?Diagnosis Date  ? Arthritis   ? Diabetes mellitus without complication ([place])   ? type 2  ? Thyroid disease   ? ? ? ?Objective / Physical Exam:  ?General:  ?The patient is alert and oriented x3 in no acute distress. ?Dermatology:  ?Skin is warm, dry and supple bilateral lower extremities. Negative for open lesions or macerations. ?Vascular:  ?Palpable pedal pulses bilaterally. No edema or erythema noted. Capillary refill within normal limits. ?Neurological:  ?Epicritic and protective threshold grossly intact bilaterally.  ?Musculoskeletal Exam:  ?Pain on palpation to the anterior lateral medial aspects of the patient's right ankle.  Moderate edema noted. Range of motion within normal limits to all pedal and ankle joints bilateral. Muscle strength 5/5 in all groups bilateral.  ? ?Radiographic Exam:  ?Normal osseous mineralization.  No acute fracture identified diffuse degenerative changes noted throughout the right foot and ankle with periarticular spurring ? ?Assessment: ?1.  Ankle sprain right; initial encounter ? ?Plan of Care:  ?1. Patient was evaluated. X-Rays reviewed.  ?2.  Cam boot dispensed.  Wear daily ?3.  Compression ankle sleeve dispensed.  Wear daily ?4.  Recommend rest ice compression and elevation is much as possible over the weekend ?5.  Return to clinic 3 weeks.  At this time if the patient is doing better we will transition her out of the cam boot into sneakers with an ankle brace ? ? ?Edrick Kins, DPM ?[place] ? ?Dr. Edrick Kins, DPM  ?  ?[place]                                        ?[place], [place] [address]                ?Office [phone]  ?Fax [phone] ? ?

## 2021-10-09 LAB — CYTOLOGY - PAP
Comment: NEGATIVE
Comment: NEGATIVE
Comment: NEGATIVE
Diagnosis: UNDETERMINED — AB
HPV 16: NEGATIVE
HPV 18 / 45: POSITIVE — AB
High risk HPV: POSITIVE — AB

## 2021-10-10 ENCOUNTER — Telehealth: Payer: Self-pay

## 2021-10-10 NOTE — Telephone Encounter (Signed)
-----   Message from Mora Bellman, MD sent at 10/10/2021 12:15 PM EDT ----- ?Please inform patient of abnormal pap smear and need for colposcopy ? ?Thanks ? ?Peggy ?

## 2021-10-10 NOTE — Telephone Encounter (Signed)
Patient aware. She has had this happen the last 2 years. She is not concerned. Unable to reach front desk for scheduling of colposcopy. Advised patient someone will contact her back to schedule. She requests the morning of 10/24/21 if possible. She already has this day off for a dental procedure in the pm. ?

## 2021-10-11 NOTE — Telephone Encounter (Signed)
Patient is scheduled for 10/29/21 with KN

## 2021-10-26 ENCOUNTER — Ambulatory Visit: Payer: BC Managed Care – PPO | Admitting: Podiatry

## 2021-10-26 DIAGNOSIS — M778 Other enthesopathies, not elsewhere classified: Secondary | ICD-10-CM

## 2021-10-26 NOTE — Progress Notes (Signed)
   HPI: 56 y.o. female presenting today for follow-up evaluation of an ankle sprain to the right ankle.  Patient states that she wore the cam boot for the last few weeks.  She is doing much better.  She no longer has any pain or tenderness to the right ankle.  She has some slight residual edema but she says that she has chronic swelling of her right ankle.  She says the compression ankle sleeve helped significantly.  She presents for further treatment and evaluation  Past Medical History:  Diagnosis Date   Arthritis    Diabetes mellitus without complication ([place])    type 2   Thyroid disease     Past Surgical History:  Procedure Laterality Date   APPENDECTOMY  1993   CALCANEAL OSTEOTOMY Left 07/17/2016   Procedure: CALCANEAL OSTEOTOMY;  Surgeon: [name], MD;  Location: [facility] ORS;  Service: Orthopedics;  Laterality: Left;   COLONOSCOPY  12/18/2018   found 1 76m polyp, going back in 5 years    COLONOSCOPY WITH PROPOFOL N/A 12/18/2018   Procedure: COLONOSCOPY WITH PROPOFOL;  Surgeon: AJonathon Bellows MD;  [facility];  Service: Gastroenterology;  Laterality: N/A;   IRRIGATION AND DEBRIDEMENT ABSCESS N/A 01/24/2015   Procedure: IRRIGATION AND DEBRIDEMENT ABSCESS/GROIN ABSCESS;  Surgeon: CMarlyce Huge MD;  Location: [facility] ORS;  Service: General;  Laterality: N/A;   KNEE ARTHROSCOPY W/ MENISCAL REPAIR Right 2008   ROTATOR CUFF REPAIR Right 2015   TONSILLECTOMY  1998    No Known Allergies   Physical Exam: General: The patient is alert and oriented x3 in no acute distress.  Dermatology: Skin is warm, dry and supple bilateral lower extremities. Negative for open lesions or macerations.  Vascular: Palpable pedal pulses bilaterally. Capillary refill within normal limits.  There is some slight edema noted to the right ankle compared to the contralateral limb.  Neurological: Light touch and protective threshold grossly intact  Musculoskeletal Exam: No pedal deformities  noted.  No pain on palpation throughout the ankle joint right  Assessment: 1.  Ankle sprain right.  DOI: 10/05/2021   Plan of Care:  1. Patient evaluated.  2.  Patient may discontinue the cam boot 3.  Continue wearing the compression ankle sleeve as needed 4.  Recommend good supportive shoes and sneakers 5.  Return to clinic as needed     BEdrick Kins DPM [facility]  Dr. BEdrick Kins DPM    [address] [place] [address]               Office [phone] Fax [phone]

## 2021-10-29 ENCOUNTER — Ambulatory Visit (INDEPENDENT_AMBULATORY_CARE_PROVIDER_SITE_OTHER): Payer: BC Managed Care – PPO | Admitting: Family Medicine

## 2021-10-29 ENCOUNTER — Encounter: Payer: Self-pay | Admitting: Family Medicine

## 2021-10-29 ENCOUNTER — Other Ambulatory Visit (HOSPITAL_COMMUNITY)
Admission: RE | Admit: 2021-10-29 | Discharge: 2021-10-29 | Disposition: A | Discharge status: Home / Self Care | Payer: BC Managed Care – PPO | Source: Ambulatory Visit | Attending: Family Medicine | Admitting: Family Medicine

## 2021-10-29 VITALS — BP 128/84 | Ht 68.0 in | Wt 240.0 lb

## 2021-10-29 DIAGNOSIS — R87810 Cervical high risk human papillomavirus (HPV) DNA test positive: Secondary | ICD-10-CM | POA: Insufficient documentation

## 2021-10-29 DIAGNOSIS — R87610 Atypical squamous cells of undetermined significance on cytologic smear of cervix (ASC-US): Secondary | ICD-10-CM

## 2021-10-29 NOTE — Progress Notes (Signed)
   Colposcopy VISIT ENCOUNTER NOTE  Subjective:   Tanya Graves is a 56 y.o. G41P0010 female here for a colposcopy.   12/22/2018- NIL, HPV 18/45 positive 01/08/2019 Colpo, 12 o'clock lesion was negative for dysplasia 05/30/20= ASCUS, HPV 18/45 positive 07/11/20= Colpo -- 1 o'clock lesion CIN 1, ECC with dysplasia, favor low grade 10/03/21= ASCUS, HPV 18/45 positive  Denies abnormal vaginal bleeding, discharge, pelvic pain, problems with intercourse or other gynecologic concerns.    Gynecologic History Patient's last menstrual period was 06/28/2015 (approximate).  Contraception: post menopausal status  Health Maintenance Due  Topic Date Due   COVID-19 Vaccine (4 - Booster for [employer] series) 06/28/2020   OPHTHALMOLOGY EXAM  07/26/2021    The following portions of the patient's history were reviewed and updated as appropriate: allergies, current medications, past family history, past medical history, past social history, past surgical history and problem list.  Review of Systems Pertinent items are noted in HPI.   Objective:  BP 128/84   Ht '5\' 8"'$  (1.727 m)   Wt 240 lb (108.9 kg)   LMP 06/28/2015 (Approximate)   BMI 36.49 kg/m  Gen: well appearing, NAD HEENT: no scleral icterus CV: RR Lung: Normal WOB Ext: warm well perfused  PELVIC: Normal appearing external genitalia; normal appearing vaginal mucosa and cervix.  No abnormal discharge noted.  COLPOSCOPY PROCEDURE NOTE  56 y.o. G1P0010 here for colposcopy for ASCUS with POSITIVE high risk HPV pap smear on 10/03/21. Discussed role for HPV in cervical dysplasia, need for surveillance.  Patient gave informed written consent, time out was performed.  Placed in lithotomy position. Cervix viewed with speculum and colposcope after application of acetic acid.   Colposcopy adequate? Yes Acetowhite lesion(s) noted at circumferentially around endocervical junction with extended lesion at 1 and 9 o'clock; corresponding biopsies  obtained.  ECC specimen obtained. All specimens were labeled and sent to pathology.  Chaperone was present during entire procedure.  Patient was given post procedure instructions.  Will follow up pathology and manage accordingly; patient will be contacted with results and recommendations.  Routine preventative health maintenance measures emphasized.   Assessment and Plan:   1. ASCUS with positive high risk HPV cervical Discussed likely CIN1-2 based on appearance and that if this is true then the next step will be LEEP procedure. Patient understands and we reviewed the procedure in general. She is comfortable with hysterectomy if this needs to occur though we discussed that is rare.  - Surgical pathology  Please refer to After Visit Summary for other counseling recommendations.   No follow-ups on file.  Caren Macadam, MD, MPH, ABFM Attending [place] for [facility]

## 2021-11-01 LAB — SURGICAL PATHOLOGY

## 2021-11-09 ENCOUNTER — Telehealth: Payer: Self-pay | Admitting: Obstetrics and Gynecology

## 2021-11-09 NOTE — Telephone Encounter (Signed)
FW: in office LEEP candidate? 11/09/21 Received: Today [name], [name]  [name], [name] Please schedule patient with Amalia Hailey for LEEP Consultation appt. I contacted patient via phone left message for patient to call back to be scheduled for Pre op appointment with Dr. Amalia Hailey for [facility] consultation.

## 2021-11-12 NOTE — Telephone Encounter (Signed)
Please schedule

## 2021-11-13 NOTE — Telephone Encounter (Signed)
Patient is scheduled for 11/22/21 at 2:00 PM with Dr. Amalia Hailey

## 2021-11-22 ENCOUNTER — Encounter: Payer: Self-pay | Admitting: Obstetrics and Gynecology

## 2021-11-22 ENCOUNTER — Ambulatory Visit: Payer: BC Managed Care – PPO | Admitting: Obstetrics and Gynecology

## 2021-11-22 VITALS — BP 128/72 | Ht 68.0 in | Wt 241.0 lb

## 2021-11-22 DIAGNOSIS — N870 Mild cervical dysplasia: Secondary | ICD-10-CM | POA: Diagnosis not present

## 2021-11-22 DIAGNOSIS — N951 Menopausal and female climacteric states: Secondary | ICD-10-CM | POA: Diagnosis not present

## 2021-11-22 MED ORDER — ESTRADIOL 0.1 MG/GM VA CREA: 0.2500 | TOPICAL_CREAM | Freq: Every day | VAGINAL | 3 refills | Status: DC

## 2021-11-22 NOTE — Progress Notes (Signed)
HPI:      Tanya Graves is a 56 y.o. G1P0010 who LMP was Patient's last menstrual period was 06/28/2015 (approximate).  Subjective:   She presents today to discuss management for CIN-1.  Patient has been found to have CIN-1 by colposcopically directed biopsies as well as a positive ECC for dysplasia.  She has had abnormal Pap smears/colposcopies for 3 years.  She is known to have type 18/45 HPV. Secondary issue is that she has vaginal wall pain with intercourse.  This has only started since she has become menopausal.  This makes speculum examinations more painful for her.  She is not currently sexually active. Of significant note, patient is changing her lifestyle to a more healthy one.  She has cut down on her smoking and has lost greater than 60 pounds since December.    Hx: The following portions of the patient's history were reviewed and updated as appropriate:             She  has a past medical history of Arthritis, Diabetes mellitus without complication ([place]), and Thyroid disease. She does not have any pertinent problems on file. She  has a past surgical history that includes Rotator cuff repair (Right, 2015); Knee arthroscopy w/ meniscal repair (Right, 2008); Irrigation and debridement abscess (N/A, 01/24/2015); Tonsillectomy (1998); Appendectomy (1993); Calcaneal osteotomy (Left, 07/17/2016); Colonoscopy with propofol (N/A, 12/18/2018); and Colonoscopy (12/18/2018). Her family history includes Alcohol abuse in her maternal grandmother; Arthritis in her mother; Breast cancer in her maternal great-grandmother; Cancer in her paternal grandfather; Diabetes in her brother, father, mother, and paternal grandmother; Heart failure in her father, maternal grandmother, and paternal grandfather; Hypertension in her brother; Kidney failure in her paternal grandmother; Transient ischemic attack in her mother. She  reports that she has been smoking cigarettes. She has a 7.50 pack-year smoking history.  She has never used smokeless tobacco. She reports current alcohol use. She reports that she does not use drugs. She has a current medication list which includes the following prescription(s): atorvastatin, bd insulin syringe u/f 1/2unit, freestyle libre 2 sensor, contour next test, bd pen needle micro u/f, levothyroxine, meloxicam, novolog, tirzepatide, and toujeo max solostar. She has No Known Allergies.       Review of Systems:  Review of Systems  Constitutional: Denied constitutional symptoms, night sweats, recent illness, fatigue, fever, insomnia and weight loss.  Eyes: Denied eye symptoms, eye pain, photophobia, vision change and visual disturbance.  Ears/Nose/Throat/Neck: Denied ear, nose, throat or neck symptoms, hearing loss, nasal discharge, sinus congestion and sore throat.  Cardiovascular: Denied cardiovascular symptoms, arrhythmia, chest pain/pressure, edema, exercise intolerance, orthopnea and palpitations.  Respiratory: Denied pulmonary symptoms, asthma, pleuritic pain, productive sputum, cough, dyspnea and wheezing.  Gastrointestinal: Denied, gastro-esophageal reflux, melena, nausea and vomiting.  Genitourinary: See HPI for additional information.  Musculoskeletal: Denied musculoskeletal symptoms, stiffness, swelling, muscle weakness and myalgia.  Dermatologic: Denied dermatology symptoms, rash and scar.  Neurologic: Denied neurology symptoms, dizziness, headache, neck pain and syncope.  Psychiatric: Denied psychiatric symptoms, anxiety and depression.  Endocrine: Denied endocrine symptoms including hot flashes and night sweats.   Meds:   Current Outpatient Medications on File Prior to Visit  Medication Sig Dispense Refill   atorvastatin (LIPITOR) 20 MG tablet Take 1 tablet (20 mg total) by mouth daily. 90 tablet 3   BD INSULIN SYRINGE U/F 1/2UNIT 31G X 5/16" 0.3 ML MISC USE WITH NOVOLOG THREE TIMES A DAY 100 each 1   Continuous Blood Gluc Sensor (FREESTYLE LIBRE 2  SENSOR)  MISC 1 Device by Does not apply route every 14 (fourteen) days. 6 each 3   glucose blood (CONTOUR NEXT TEST) test strip 1 each by Other route as directed. 6x daily due to hypoglycemia 600 each 3   Insulin Pen Needle (BD PEN NEEDLE MICRO U/F) 32G X 6 MM MISC Inject 1 Device into the skin in the morning, at noon, in the evening, and at bedtime. 4x a day 400 each 3   levothyroxine (SYNTHROID) 175 MCG tablet Take 1 tablet (175 mcg total) by mouth daily with breakfast. 90 tablet 3   meloxicam (MOBIC) 15 MG tablet Take 1 tablet (15 mg total) by mouth daily. 30 tablet 2   NOVOLOG 100 UNIT/ML injection Inject 60 Units into the skin daily.     tirzepatide [facility]) 5 MG/0.5ML Pen Inject 5 mg into the skin once a week. 6 mL 3   insulin glargine, 2 Unit Dial, (TOUJEO MAX SOLOSTAR) 300 UNIT/ML Solostar Pen Inject 24 Units into the skin daily in the afternoon. 15 mL 3   No current facility-administered medications on file prior to visit.      Objective:     Vitals:   11/22/21 1410  BP: 128/72   Filed Weights   11/22/21 1410  Weight: 241 lb (109.3 kg)              Biopsy results and Pap smears reviewed.          Assessment:    G1P0010 Patient Active Problem List   Diagnosis Date Noted   ASCUS with positive high risk HPV cervical 10/29/2021   OSA (obstructive sleep apnea) 08/27/2021   Abscess of right axilla 04/13/2021   Venous insufficiency 04/13/2021   Snoring 04/13/2021   Type 2 diabetes mellitus with ophthalmic complication ([place]) 76/19/5093   Type 2 diabetes mellitus with hyperglycemia, with long-term current use of insulin ([place]) 06/30/2020   Type 2 diabetes mellitus with diabetic polyneuropathy, with long-term current use of insulin ([place]) 09/10/2018   Tobacco use 07/22/2018   Hypothyroidism 07/22/2018   Hyperlipidemia associated with type 2 diabetes mellitus ([place]) 07/22/2018   Poorly controlled type 2 diabetes mellitus ([place]) 01/22/2015     1. Dysplasia of cervix, low grade (CIN  1)   2. Vaginal dryness, menopausal     Persistent CIN-1.  Abnormal ECC showing dysplasia.  Patient likely has menopausal loss of elasticity lubrication and vaginal thickness causing increased vaginal pain during menopause. (Atrophic vaginitis)   Plan:            1.  I have discussed multiple options with her regarding CIN-1 with positive ECC.  She is elected to undergo a LEEP.  She believes that she can tolerate speculum examination in the office and desires this over the OR. The natural course and history of HPV have been discussed in detail.  Its relationship to CIN was discussed CIN's relationship to cervical cancer was discussed.  We will plan for LEEP in 6 weeks. The effect of smoking on CIN and HPV its progression was discussed.  Patient is currently discontinuing smoking.  2.  We will start Premarin vaginal cream to attempt to treat atrophic vaginitis and vaginal pain. Orders No orders of the defined types were placed in this encounter.   No orders of the defined types were placed in this encounter.     F/U  No follow-ups on file. I spent 32 minutes involved in the care of this patient preparing to see the patient by obtaining  and reviewing her medical history (including labs, imaging tests and prior procedures), documenting clinical information in the electronic health record (EHR), counseling and coordinating care plans, writing and sending prescriptions, ordering tests or procedures and in direct communicating with the patient and medical staff discussing pertinent items from her history and physical exam.  Finis Bud, M.D. 11/22/2021 2:47 PM

## 2021-11-22 NOTE — Progress Notes (Signed)
Patient presents today to discuss a LEEP procedure. She states a 3 year history of abnormal pap smears, the last resulting in a colposcopy showing CIN I and HPV+ 18/45. She states she also has extreme pain within her vaginal walls, worse with intercourse and exams. No other concerns at this time.

## 2021-11-30 LAB — HM DIABETES EYE EXAM

## 2021-12-10 ENCOUNTER — Other Ambulatory Visit: Payer: Self-pay | Admitting: Podiatry

## 2021-12-10 ENCOUNTER — Telehealth: Payer: Self-pay | Admitting: Obstetrics and Gynecology

## 2021-12-10 NOTE — Telephone Encounter (Signed)
Pt called in and said that she needed to have a LEEP procedure done.  Said she had been waiting for someone to contact her.

## 2021-12-19 ENCOUNTER — Ambulatory Visit: Payer: BC Managed Care – PPO | Admitting: Internal Medicine

## 2021-12-27 ENCOUNTER — Encounter: Payer: Self-pay | Admitting: Family Medicine

## 2022-01-04 ENCOUNTER — Ambulatory Visit: Payer: Self-pay

## 2022-01-04 NOTE — Patient Outreach (Signed)
  Care Coordination   01/04/2022 Name: KEUNDRA PETRUCELLI MRN: [id number] DOB: [birth year]   Care Coordination Outreach Attempts: Contact was made with Ms. Staron. Anticipates being available for outreach on January 09, 2022. Appointment scheduled.    Follow Up Plan:  Appointment scheduled.  Encounter Outcome:  Pt. Scheduled  Care Coordination Interventions Activated:  No   Care Coordination Interventions:  No, not indicated     [facility] [phone]

## 2022-01-06 ENCOUNTER — Emergency Department: Payer: BC Managed Care – PPO

## 2022-01-06 ENCOUNTER — Observation Stay
Admission: EM | Admit: 2022-01-06 | Discharge: 2022-01-07 | Disposition: A | Discharge status: Home / Self Care | Payer: BC Managed Care – PPO | Attending: Internal Medicine | Admitting: Internal Medicine

## 2022-01-06 ENCOUNTER — Other Ambulatory Visit: Payer: Self-pay

## 2022-01-06 DIAGNOSIS — Z8249 Family history of ischemic heart disease and other diseases of the circulatory system: Secondary | ICD-10-CM

## 2022-01-06 DIAGNOSIS — Z79890 Hormone replacement therapy (postmenopausal): Secondary | ICD-10-CM

## 2022-01-06 DIAGNOSIS — E785 Hyperlipidemia, unspecified: Secondary | ICD-10-CM | POA: Diagnosis not present

## 2022-01-06 DIAGNOSIS — N179 Acute kidney failure, unspecified: Secondary | ICD-10-CM | POA: Diagnosis not present

## 2022-01-06 DIAGNOSIS — E039 Hypothyroidism, unspecified: Secondary | ICD-10-CM | POA: Diagnosis present

## 2022-01-06 DIAGNOSIS — L739 Follicular disorder, unspecified: Secondary | ICD-10-CM

## 2022-01-06 DIAGNOSIS — E1110 Type 2 diabetes mellitus with ketoacidosis without coma: Principal | ICD-10-CM | POA: Diagnosis present

## 2022-01-06 DIAGNOSIS — A419 Sepsis, unspecified organism: Secondary | ICD-10-CM | POA: Diagnosis not present

## 2022-01-06 DIAGNOSIS — Z794 Long term (current) use of insulin: Secondary | ICD-10-CM

## 2022-01-06 DIAGNOSIS — Z8261 Family history of arthritis: Secondary | ICD-10-CM

## 2022-01-06 DIAGNOSIS — R6510 Systemic inflammatory response syndrome (SIRS) of non-infectious origin without acute organ dysfunction: Secondary | ICD-10-CM | POA: Diagnosis present

## 2022-01-06 DIAGNOSIS — F17210 Nicotine dependence, cigarettes, uncomplicated: Secondary | ICD-10-CM | POA: Diagnosis not present

## 2022-01-06 DIAGNOSIS — Z803 Family history of malignant neoplasm of breast: Secondary | ICD-10-CM

## 2022-01-06 DIAGNOSIS — Z720 Tobacco use: Secondary | ICD-10-CM | POA: Diagnosis present

## 2022-01-06 DIAGNOSIS — L660 Pseudopelade: Secondary | ICD-10-CM | POA: Diagnosis not present

## 2022-01-06 DIAGNOSIS — R110 Nausea: Secondary | ICD-10-CM | POA: Diagnosis present

## 2022-01-06 DIAGNOSIS — E860 Dehydration: Secondary | ICD-10-CM | POA: Diagnosis present

## 2022-01-06 DIAGNOSIS — Z79899 Other long term (current) drug therapy: Secondary | ICD-10-CM

## 2022-01-06 DIAGNOSIS — Z833 Family history of diabetes mellitus: Secondary | ICD-10-CM

## 2022-01-06 LAB — BLOOD GAS, VENOUS
Acid-base deficit: 4.1 mmol/L — ABNORMAL HIGH (ref 0.0–2.0)
Bicarbonate: 21.6 mmol/L (ref 20.0–28.0)
O2 Saturation: 33.7 %
Patient temperature: 37
pCO2, Ven: 41 mmHg — ABNORMAL LOW (ref 44–60)
pH, Ven: 7.33 (ref 7.25–7.43)
pO2, Ven: <31 mmHg — CL (ref 32–45)

## 2022-01-06 LAB — BASIC METABOLIC PANEL
Anion gap: 8 (ref 5–15)
BUN: 28 mg/dL — ABNORMAL HIGH (ref 6–20)
CO2: 19 mmol/L — ABNORMAL LOW (ref 22–32)
Calcium: 7.9 mg/dL — ABNORMAL LOW (ref 8.9–10.3)
Chloride: 109 mmol/L (ref 98–111)
Creatinine, Ser: 1.02 mg/dL — ABNORMAL HIGH (ref 0.44–1.00)
GFR, Estimated: >60 mL/min (ref >60)
Glucose, Bld: 185 mg/dL — ABNORMAL HIGH (ref 70–99)
Potassium: 3.7 mmol/L (ref 3.5–5.1)
Sodium: 136 mmol/L (ref 135–145)

## 2022-01-06 LAB — CBG MONITORING, ED
Glucose-Capillary: 246 mg/dL — ABNORMAL HIGH (ref 70–99)
Glucose-Capillary: 230 mg/dL — ABNORMAL HIGH (ref 70–99)
Glucose-Capillary: 218 mg/dL — ABNORMAL HIGH (ref 70–99)
Glucose-Capillary: 205 mg/dL — ABNORMAL HIGH (ref 70–99)
Glucose-Capillary: 169 mg/dL — ABNORMAL HIGH (ref 70–99)
Glucose-Capillary: 129 mg/dL — ABNORMAL HIGH (ref 70–99)
Glucose-Capillary: 150 mg/dL — ABNORMAL HIGH (ref 70–99)

## 2022-01-06 LAB — COMPREHENSIVE METABOLIC PANEL
ALT: 14 U/L (ref 0–44)
AST: 25 U/L (ref 15–41)
Albumin: 4.3 g/dL (ref 3.5–5.0)
Alkaline Phosphatase: 108 U/L (ref 38–126)
Anion gap: 16 — ABNORMAL HIGH (ref 5–15)
BUN: 33 mg/dL — ABNORMAL HIGH (ref 6–20)
CO2: 17 mmol/L — ABNORMAL LOW (ref 22–32)
Calcium: 9.4 mg/dL (ref 8.9–10.3)
Chloride: 101 mmol/L (ref 98–111)
Creatinine, Ser: 1.63 mg/dL — ABNORMAL HIGH (ref 0.44–1.00)
GFR, Estimated: 37 mL/min — ABNORMAL LOW (ref >60)
Glucose, Bld: 231 mg/dL — ABNORMAL HIGH (ref 70–99)
Potassium: 4.9 mmol/L (ref 3.5–5.1)
Sodium: 134 mmol/L — ABNORMAL LOW (ref 135–145)
Total Bilirubin: 2.2 mg/dL — ABNORMAL HIGH (ref 0.3–1.2)
Total Protein: 7.6 g/dL (ref 6.5–8.1)

## 2022-01-06 LAB — LACTIC ACID, PLASMA
Lactic Acid, Venous: 1.6 mmol/L (ref 0.5–1.9)
Lactic Acid, Venous: 1.3 mmol/L (ref 0.5–1.9)

## 2022-01-06 LAB — TROPONIN I (HIGH SENSITIVITY)
Troponin I (High Sensitivity): 9 ng/L (ref <18)
Troponin I (High Sensitivity): 5 ng/L (ref <18)

## 2022-01-06 LAB — CBC
HCT: 44.3 % (ref 36.0–46.0)
Hemoglobin: 15.1 g/dL — ABNORMAL HIGH (ref 12.0–15.0)
MCH: 32.1 pg (ref 26.0–34.0)
MCHC: 34.1 g/dL (ref 30.0–36.0)
MCV: 94.3 fL (ref 80.0–100.0)
Platelets: 316 10*3/uL (ref 150–400)
RBC: 4.7 MIL/uL (ref 3.87–5.11)
RDW: 11.9 % (ref 11.5–15.5)
WBC: 14.2 10*3/uL — ABNORMAL HIGH (ref 4.0–10.5)
nRBC: 0 % (ref 0.0–0.2)

## 2022-01-06 LAB — URINALYSIS, ROUTINE W REFLEX MICROSCOPIC
Bacteria, UA: NONE SEEN
Bilirubin Urine: NEGATIVE
Glucose, UA: >=500 mg/dL — AB
Hgb urine dipstick: NEGATIVE
Ketones, ur: 80 mg/dL — AB
Leukocytes,Ua: NEGATIVE
Nitrite: NEGATIVE
Protein, ur: 30 mg/dL — AB
Specific Gravity, Urine: >1.046 — ABNORMAL HIGH (ref 1.005–1.030)
pH: 5 (ref 5.0–8.0)

## 2022-01-06 LAB — POC URINE PREG, ED: Preg Test, Ur: NEGATIVE

## 2022-01-06 LAB — BETA-HYDROXYBUTYRIC ACID
Beta-Hydroxybutyric Acid: 4.21 mmol/L — ABNORMAL HIGH (ref 0.05–0.27)
Beta-Hydroxybutyric Acid: 3.63 mmol/L — ABNORMAL HIGH (ref 0.05–0.27)

## 2022-01-06 LAB — LIPASE, BLOOD: Lipase: 29 U/L (ref 11–51)

## 2022-01-06 MED ORDER — ENOXAPARIN SODIUM 60 MG/0.6ML IJ SOSY
0.5000 mg/kg | PREFILLED_SYRINGE | INTRAMUSCULAR | Status: DC
  Administered 2022-01-07: 50 mg via SUBCUTANEOUS
  Filled 2022-01-06: qty 0.6

## 2022-01-06 MED ORDER — ONDANSETRON HCL 4 MG/2ML IJ SOLN
4.0000 mg | Freq: Once | INTRAMUSCULAR | Status: AC
  Administered 2022-01-06: 4 mg via INTRAVENOUS
  Filled 2022-01-06: qty 2

## 2022-01-06 MED ORDER — LACTATED RINGERS IV SOLN
INTRAVENOUS | Status: DC
  Administered 2022-01-06: 150 mL via INTRAVENOUS

## 2022-01-06 MED ORDER — MAGNESIUM HYDROXIDE 400 MG/5ML PO SUSP: 30.0000 mL | Freq: Every day | ORAL | Status: DC | PRN

## 2022-01-06 MED ORDER — LACTATED RINGERS IV BOLUS
1000.0000 mL | Freq: Once | INTRAVENOUS | Status: AC
  Administered 2022-01-06: 1000 mL via INTRAVENOUS

## 2022-01-06 MED ORDER — TRAZODONE HCL 50 MG PO TABS: 25.0000 mg | ORAL_TABLET | Freq: Every evening | ORAL | Status: DC | PRN

## 2022-01-06 MED ORDER — ONDANSETRON HCL 4 MG/2ML IJ SOLN: 4.0000 mg | Freq: Four times a day (QID) | INTRAMUSCULAR | Status: DC | PRN

## 2022-01-06 MED ORDER — POTASSIUM CHLORIDE 10 MEQ/100ML IV SOLN
10.0000 meq | INTRAVENOUS | Status: AC
  Administered 2022-01-06 (×2): 10 meq via INTRAVENOUS
  Filled 2022-01-06 (×2): qty 100

## 2022-01-06 MED ORDER — IOHEXOL 300 MG/ML  SOLN
80.0000 mL | Freq: Once | INTRAMUSCULAR | Status: AC | PRN
  Administered 2022-01-06: 80 mL via INTRAVENOUS

## 2022-01-06 MED ORDER — ATORVASTATIN CALCIUM 20 MG PO TABS
20.0000 mg | ORAL_TABLET | Freq: Every day | ORAL | Status: DC
  Administered 2022-01-07: 20 mg via ORAL
  Filled 2022-01-06: qty 1

## 2022-01-06 MED ORDER — ACETAMINOPHEN 325 MG PO TABS
650.0000 mg | ORAL_TABLET | Freq: Four times a day (QID) | ORAL | Status: DC | PRN
  Administered 2022-01-07: 650 mg via ORAL
  Filled 2022-01-06: qty 2

## 2022-01-06 MED ORDER — ONDANSETRON 4 MG PO TBDP
4.0000 mg | ORAL_TABLET | Freq: Once | ORAL | Status: AC | PRN
  Administered 2022-01-06: 4 mg via ORAL
  Filled 2022-01-06: qty 1

## 2022-01-06 MED ORDER — CEFEPIME HCL 2 G IV SOLR
2.0000 g | INTRAVENOUS | Status: AC
  Administered 2022-01-06: 2 g via INTRAVENOUS
  Filled 2022-01-06: qty 12.5

## 2022-01-06 MED ORDER — ESTRADIOL 0.1 MG/GM VA CREA: 0.2500 | TOPICAL_CREAM | Freq: Every day | VAGINAL | Status: DC

## 2022-01-06 MED ORDER — ONDANSETRON HCL 4 MG/2ML IJ SOLN
4.0000 mg | Freq: Once | INTRAMUSCULAR | Status: DC
  Filled 2022-01-06: qty 2

## 2022-01-06 MED ORDER — TIRZEPATIDE 5 MG/0.5ML ~~LOC~~ SOAJ: 5.0000 mg | SUBCUTANEOUS | Status: DC

## 2022-01-06 MED ORDER — INSULIN REGULAR(HUMAN) IN NACL 100-0.9 UT/100ML-% IV SOLN
INTRAVENOUS | Status: DC
  Administered 2022-01-06: 8 [IU]/h via INTRAVENOUS
  Filled 2022-01-06: qty 100

## 2022-01-06 MED ORDER — DEXTROSE IN LACTATED RINGERS 5 % IV SOLN: INTRAVENOUS | Status: DC

## 2022-01-06 MED ORDER — LEVOTHYROXINE SODIUM 50 MCG PO TABS
175.0000 ug | ORAL_TABLET | Freq: Every day | ORAL | Status: DC
  Administered 2022-01-07: 175 ug via ORAL
  Filled 2022-01-06: qty 4

## 2022-01-06 MED ORDER — SODIUM CHLORIDE 0.9 % IV SOLN: 2.0000 g | Freq: Once | INTRAVENOUS | Status: DC

## 2022-01-06 MED ORDER — MORPHINE SULFATE (PF) 4 MG/ML IV SOLN
4.0000 mg | Freq: Once | INTRAVENOUS | Status: AC
  Administered 2022-01-06: 4 mg via INTRAVENOUS
  Filled 2022-01-06: qty 1

## 2022-01-06 MED ORDER — INSULIN GLARGINE-YFGN 100 UNIT/ML ~~LOC~~ SOLN
30.0000 [IU] | Freq: Every day | SUBCUTANEOUS | Status: DC
  Administered 2022-01-07: 30 [IU] via SUBCUTANEOUS
  Filled 2022-01-06: qty 0.3

## 2022-01-06 MED ORDER — DEXTROSE 50 % IV SOLN: 0.0000 mL | INTRAVENOUS | Status: DC | PRN

## 2022-01-06 MED ORDER — ACETAMINOPHEN 325 MG RE SUPP: 650.0000 mg | Freq: Four times a day (QID) | RECTAL | Status: DC | PRN

## 2022-01-06 MED ORDER — ONDANSETRON HCL 4 MG PO TABS: 4.0000 mg | ORAL_TABLET | Freq: Four times a day (QID) | ORAL | Status: DC | PRN

## 2022-01-06 MED ORDER — LACTATED RINGERS IV BOLUS: 20.0000 mL/kg | Freq: Once | INTRAVENOUS | Status: DC

## 2022-01-06 MED ORDER — VANCOMYCIN HCL IN DEXTROSE 1-5 GM/200ML-% IV SOLN
1000.0000 mg | INTRAVENOUS | Status: AC
  Administered 2022-01-06: 1000 mg via INTRAVENOUS
  Filled 2022-01-06: qty 200

## 2022-01-06 MED ORDER — SODIUM CHLORIDE 0.9 % IV BOLUS
500.0000 mL | Freq: Once | INTRAVENOUS | Status: AC
  Administered 2022-01-06: 500 mL via INTRAVENOUS

## 2022-01-06 MED ORDER — HALOPERIDOL LACTATE 5 MG/ML IJ SOLN
2.5000 mg | Freq: Once | INTRAMUSCULAR | Status: AC
  Administered 2022-01-06: 2.5 mg via INTRAVENOUS
  Filled 2022-01-06: qty 1

## 2022-01-06 MED ORDER — LACTATED RINGERS IV SOLN: INTRAVENOUS | Status: DC

## 2022-01-06 NOTE — ED Triage Notes (Signed)
Pt states she having a procedure for precancerous cells in her cervix this coming week. This past week pt waking up with leg cramps and frequent urination. Pt is type 1 diabetic, Pt uses long acting at night and takes insulin before meals. Pt states her bg has been over 400 off and on for 3 days. Pt has had nausea x several days.

## 2022-01-06 NOTE — Assessment & Plan Note (Addendum)
Continue statin therapy.

## 2022-01-06 NOTE — ED Notes (Signed)
Called lab about beta hydroxy level- Lab endorsing that the results will be available soon.

## 2022-01-06 NOTE — Assessment & Plan Note (Addendum)
Continue Synthroid °

## 2022-01-06 NOTE — Assessment & Plan Note (Addendum)
Resolved Anion gap has closed Continue long-acting insulin, Premeal insulin as well as weekly Tirzepatide Check blood sugars with meals and at bedtime

## 2022-01-06 NOTE — ED Notes (Signed)
Pt stating they are in menopause and know they are not pregnant. Pt refusing urine preg at this time.

## 2022-01-06 NOTE — Progress Notes (Signed)
CODE SEPSIS - PHARMACY COMMUNICATION  **Broad Spectrum Antibiotics should be administered within 1 hour of Sepsis diagnosis**  Time Code Sepsis Called/Page Received: 1727  Antibiotics Ordered: CFP, vanc  Time of 1st antibiotic administration: 1025  Additional action taken by pharmacy:    If necessary, Name of Provider/Nurse Contacted:      Noralee Space ,PharmD Clinical Pharmacist  01/06/2022  6:05 PM

## 2022-01-06 NOTE — H&P (Signed)
[place]   PATIENT NAME: Tanya Graves    MR#:  [id number]  DATE OF BIRTH:  1966/05/13  DATE OF ADMISSION:  01/06/2022  PRIMARY CARE PHYSICIAN: [name], MD   Patient is coming from: Home  REQUESTING/REFERRING PHYSICIAN: Duffy Bruce, MD  CHIEF COMPLAINT:   Chief Complaint  Patient presents with   Nausea    HISTORY OF PRESENT ILLNESS:  Tanya Graves is a 56 y.o. Caucasian female with medical history significant for osteoarthritis, type diabetes mellitus and hypothyroidism, who presented to the emergency room with acute onset of hyperglycemia with associated nausea, vomiting, dizziness and leg cramps.  Her symptoms have been going on over the last few days and got significantly worse with associated generalized weakness.  She stated she has been taking her insulin but her blood pressure was elevated and she could not get it under control.  She has an erythematous area in her lower abdomen with associated pain and tenderness.  She was able to express a small amount of pus from it.  She admits to history of recurrent folliculitis.  She admits to polyuria and polydipsia.  No fever or chills.  She has been taking her medications and her insulin as prescribed.  No chest pain or palpitations but no cough or wheezing or dyspnea.  No dysuria, oliguria or hematuria or flank pain.  ED Course: When she came to the ER heart rate was 128 with a respiratory rate of 22, blood pressure of 96/75 and later 101/74 and otherwise normal vital signs.  Labs revealed VBG with pH 7.33 PCO2 41 and HCO3 of 21.6.  CMP revealed a sodium of 134 potassium 4.9 with CO2 of 17 anion gap of 16, BUN of 33 and creatinine 1.63 compared to 20/0.81 on 04/16/2021.  CRP was 1.6.  CBC showed leukocytosis of 14.2 and mild hemoconcentration. EKG as reviewed by me : EKG showed sinus tachycardia with a rate of 131 with right atrial enlargement. Imaging: Portable chest ray showed no acute cardiopulmonary  disease.  The patient was given IV cefepime and vancomycin, 2.5 mg IV Haldol, 4 mg of IV morphine sulfate and 4 mg of IV Zofran, 2 L bolus of IV lactated Ringer and 500 mill IV normal saline bolus and was started on IV insulin drip per Endo tool DKA protocol.  PAST MEDICAL HISTORY:   Past Medical History:  Diagnosis Date   Arthritis    Diabetes mellitus without complication ([place])    type 2   Thyroid disease     PAST SURGICAL HISTORY:   Past Surgical History:  Procedure Laterality Date   APPENDECTOMY  1993   CALCANEAL OSTEOTOMY Left 07/17/2016   Procedure: CALCANEAL OSTEOTOMY;  Surgeon: [name], MD;  Location: [facility] ORS;  Service: Orthopedics;  Laterality: Left;   COLONOSCOPY  12/18/2018   found 1 58m polyp, going back in 5 years    COLONOSCOPY WITH PROPOFOL N/A 12/18/2018   Procedure: COLONOSCOPY WITH PROPOFOL;  Surgeon: AJonathon Bellows MD;  [facility];  Service: Gastroenterology;  Laterality: N/A;   IRRIGATION AND DEBRIDEMENT ABSCESS N/A 01/24/2015   Procedure: IRRIGATION AND DEBRIDEMENT ABSCESS/GROIN ABSCESS;  Surgeon: CMarlyce Huge MD;  Location: [facility] ORS;  Service: General;  Laterality: N/A;   KNEE ARTHROSCOPY W/ MENISCAL REPAIR Right 2008   ROTATOR CUFF REPAIR Right 2015   TONSILLECTOMY  1998    SOCIAL HISTORY:   Social History   Tobacco Use   Smoking status: Every Day  Packs/day: 0.25    Years: 30.00    Total pack years: 7.50    Types: Cigarettes   Smokeless tobacco: Never   Tobacco comments:    1 pack a week  Substance Use Topics   Alcohol use: Yes    Comment: a couple of times a month, typically less than 2 drinks    FAMILY HISTORY:   Family History  Problem Relation Age of Onset   Diabetes Father    Heart failure Father    Diabetes Brother    Arthritis Mother    Diabetes Mother    Transient ischemic attack Mother    Heart failure Maternal Grandmother    Alcohol abuse Maternal Grandmother    Diabetes Paternal Grandmother     Kidney failure Paternal Grandmother    Heart failure Paternal Grandfather    Cancer Paternal Grandfather        unknown type   Hypertension Brother    Breast cancer Maternal Great-grandmother     DRUG ALLERGIES:  No Known Allergies  REVIEW OF SYSTEMS:   ROS As per history of present illness. All pertinent systems were reviewed above. Constitutional, HEENT, cardiovascular, respiratory, GI, GU, musculoskeletal, neuro, psychiatric, endocrine, integumentary and hematologic systems were reviewed and are otherwise negative/unremarkable except for positive findings mentioned above in the HPI.   MEDICATIONS AT HOME:   Prior to Admission medications   Medication Sig Start Date End Date Taking? Authorizing Provider  atorvastatin (LIPITOR) 20 MG tablet Take 1 tablet (20 mg total) by mouth daily. 04/18/21   Shamleffer, Melanie Crazier, MD  BD INSULIN SYRINGE U/F 1/2UNIT 31G X 5/16" 0.3 ML MISC USE WITH NOVOLOG THREE TIMES A DAY 05/08/21   Shamleffer, Melanie Crazier, MD  Continuous Blood Gluc Sensor (FREESTYLE LIBRE 2 SENSOR) MISC 1 Device by Does not apply route every 14 (fourteen) days. 08/15/21   Shamleffer, Melanie Crazier, MD  estradiol (ESTRACE) 0.1 MG/GM vaginal cream Place 1.61 Applicatorfuls vaginally at bedtime. 11/22/21 11/22/22  Harlin Heys, MD  glucose blood (CONTOUR NEXT TEST) test strip 1 each by Other route as directed. 6x daily due to hypoglycemia 08/15/21   Shamleffer, Melanie Crazier, MD  insulin glargine, 2 Unit Dial, (TOUJEO MAX SOLOSTAR) 300 UNIT/ML Solostar Pen Inject 24 Units into the skin daily in the afternoon. 08/23/21   Shamleffer, Melanie Crazier, MD  Insulin Pen Needle (BD PEN NEEDLE MICRO U/F) 32G X 6 MM MISC Inject 1 Device into the skin in the morning, at noon, in the evening, and at bedtime. 4x a day 08/15/21   Shamleffer, Melanie Crazier, MD  levothyroxine (SYNTHROID) 175 MCG tablet Take 1 tablet (175 mcg total) by mouth daily with breakfast. 08/15/21    Shamleffer, Melanie Crazier, MD  meloxicam (MOBIC) 15 MG tablet TAKE ONE TABLET BY MOUTH DAILY 12/10/21   Edrick Kins, DPM  NOVOLOG 100 UNIT/ML injection Inject 60 Units into the skin daily. 08/19/21   [provider]  tirzepatide Darcel Bayley) 5 MG/0.5ML Pen Inject 5 mg into the skin once a week. 08/15/21   Shamleffer, Melanie Crazier, MD      VITAL SIGNS:  Blood pressure 116/67, pulse 77, temperature 98.6 F (37 C), resp. rate 12, height '5\' 8"'$  (1.727 m), weight 101.2 kg, last menstrual period 06/28/2015, SpO2 96 %.  PHYSICAL EXAMINATION:  Physical Exam  GENERAL:  56 y.o.-year-old Caucasian female patient lying in the bed with no acute distress.  EYES: Pupils equal, round, reactive to light and accommodation. No scleral icterus. Extraocular muscles intact.  HEENT: Head atraumatic, normocephalic. Oropharynx and nasopharynx clear.  NECK:  Supple, no jugular venous distention. No thyroid enlargement, no tenderness.  LUNGS: Normal breath sounds bilaterally, no wheezing, rales,rhonchi or crepitation. No use of accessory muscles of respiration.  CARDIOVASCULAR: Regular rate and rhythm, S1, S2 normal. No murmurs, rubs, or gallops.  ABDOMEN: Soft, nondistended, nontender. Bowel sounds present. No organomegaly or mass.  EXTREMITIES: No pedal edema, cyanosis, or clubbing.  NEUROLOGIC: Cranial nerves II through XII are intact. Muscle strength 5/5 in all extremities. Sensation intact. Gait not checked.  PSYCHIATRIC: The patient is alert and oriented x 3.  Normal affect and good eye contact. SKIN: Lower abdominal small abscess with associated folliculitis as shown below.    LABORATORY PANEL:   CBC Recent Labs  Lab 01/06/22 1639  WBC 14.2*  HGB 15.1*  HCT 44.3  PLT 316   ------------------------------------------------------------------------------------------------------------------  Chemistries  Recent Labs  Lab 01/06/22 1639 01/06/22 2120 01/06/22 2320  NA 134*   < > 135   K 4.9   < > 3.8  CL 101   < > 105  CO2 17*   < > 20*  GLUCOSE 231*   < > 145*  BUN 33*   < > 28*  CREATININE 1.63*   < > 1.06*  CALCIUM 9.4   < > 8.4*  AST 25  --   --   ALT 14  --   --   ALKPHOS 108  --   --   BILITOT 2.2*  --   --    < > = values in this interval not displayed.   ------------------------------------------------------------------------------------------------------------------  Cardiac Enzymes No results for input(s): "TROPONINI" in the last 168 hours. ------------------------------------------------------------------------------------------------------------------  RADIOLOGY:  CT ABDOMEN PELVIS W CONTRAST  Result Date: 01/06/2022 CLINICAL DATA:  Abdominal pain EXAM: CT ABDOMEN AND PELVIS WITH CONTRAST TECHNIQUE: Multidetector CT imaging of the abdomen and pelvis was performed using the standard protocol following bolus administration of intravenous contrast. RADIATION DOSE REDUCTION: This exam was performed according to the departmental dose-optimization program which includes automated exposure control, adjustment of the mA and/or kV according to patient size and/or use of iterative reconstruction technique. CONTRAST:  80m OMNIPAQUE IOHEXOL 300 MG/ML  SOLN COMPARISON:  None Available. FINDINGS: Lower chest: No acute abnormality. Hepatobiliary: No suspicious focal liver abnormality is seen. No gallstones, gallbladder wall thickening, or biliary dilatation. Pancreas: Unremarkable. No pancreatic ductal dilatation or surrounding inflammatory changes. Spleen: Normal in size without focal abnormality. Adrenals/Urinary Tract: 3.1 cm left adrenal nodule. Unremarkable right adrenal gland. Kidneys are normal, without renal calculi, suspicious focal lesion, or hydronephrosis. Simple cyst in the left kidney not requiring follow-up. Bladder is unremarkable. Stomach/Bowel: Stomach is within normal limits. The appendix is normal. No evidence of bowel wall thickening, distention, or  inflammatory changes. Mild diverticulosis without diverticulitis. Vascular/Lymphatic: Mild aortic atherosclerosis. No suspicious adenopathy. Reproductive: Unremarkable. Other: No free intraperitoneal fluid or air. Musculoskeletal: No acute or significant osseous findings. IMPRESSION: 1. No acute abnormality in the abdomen or pelvis. 2. 3.1 cm left adrenal nodule may represent an adrenal adenoma however is indeterminate. Further evaluation with nonemergent outpatient adrenal protocol MRI or CT is recommended. These results were called by telephone at the time of interpretation on 01/06/2022 at 7:48 pm to provider CDuffy Bruce, who verbally acknowledged these results. Electronically Signed   By: TPlacido SouM.D.   On: 01/06/2022 20:04   DG Chest Portable 1 View  Result Date: 01/06/2022 CLINICAL DATA:  Chest pain EXAM: PORTABLE CHEST  1 VIEW COMPARISON:  12/21/2020 FINDINGS: The heart size and mediastinal contours are within normal limits. Both lungs are clear. The visualized skeletal structures are unremarkable. IMPRESSION: No active disease. Electronically Signed   By: Inez Catalina M.D.   On: 01/06/2022 19:44      IMPRESSION AND PLAN:  Assessment and Plan: * DKA, type 2 ([place]) - The patient will be admitted to a progressive unit bed. - I expect her next BMP which will resolution of her will show closure of her anion gap and acidosis resolution. - We will continue hydration with IV lactated Ringer. - We will follow IV insulin drip per Endo tool DKA protocol for now. - We will keep n.p.o. except for medications pending resolution of her DKA.  AKI (acute kidney injury) ([place]) - The likely prerenal due to volume depletion and dehydration. - The patient will be hydrated with IV lactated Ringer and will follow BMPs. - We will avoid nephrotoxins.  Folliculitis - We will place her on IV Rocephin. - I do believe that the patient has subsequent sepsis. - I believe her leukocytosis and tachycardia  are related to her DKA.  Dyslipidemia - We will continue statin therapy.  Hypothyroidism - We will continue Synthroid.  Tobacco use I counseled her for smoking cessation and she will receive further counseling here.   DVT prophylaxis: Lovenox.  Advanced Care Planning:  Code Status: full code.  Family Communication:  The plan of care was discussed in details with the patient (and family). I answered all questions. The patient agreed to proceed with the above mentioned plan. Further management will depend upon hospital course. Disposition Plan: Back to previous home environment Consults called: none.  All the records are reviewed and case discussed with ED provider.  Status is: Inpatient  At the time of the admission, it appears that the appropriate admission status for this patient is inpatient.  This is judged to be reasonable and necessary in order to provide the required intensity of service to ensure the patient's safety given the presenting symptoms, physical exam findings and initial radiographic and laboratory data in the context of comorbid conditions.  The patient requires inpatient status due to high intensity of service, high risk of further deterioration and high frequency of surveillance required.  I certify that at the time of admission, it is my clinical judgment that the patient will require inpatient hospital care extending more than 2 midnights.                            Dispo: The patient is from: Home              Anticipated d/c is to: Home              Patient currently is not medically stable to d/c.              Difficult to place patient: No  Christel Mormon M.D on 01/07/2022 at 12:35 AM  [facility]   From 7 PM-7 AM, contact night-coverage [email or web address]  CC: Primary care physician; [name], MD

## 2022-01-06 NOTE — ED Notes (Signed)
First Nurse: Pt to ED via POV c/o high blood sugar, dizziness, vomiting, and leg cramps since Wednesday. Pt is currently in NAD.

## 2022-01-06 NOTE — Assessment & Plan Note (Addendum)
Smoking cessation counseling was discussed with patient in detail

## 2022-01-06 NOTE — Assessment & Plan Note (Addendum)
Most likely prerenal and due to volume depletion and dehydration. Renal function improved with IV fluid hydration

## 2022-01-06 NOTE — Progress Notes (Signed)
PHARMACY -  BRIEF ANTIBIOTIC NOTE   Pharmacy has received consult(s) for vancomycin and cefepime from an ED provider.  The patient's profile has been reviewed for ht/wt/allergies/indication/available labs.    One time order(s) placed for cefepime 2 gm and vancomycin 1 gm  Further antibiotics/pharmacy consults should be ordered by admitting physician if indicated.                       Thank you, Arnie Clingenpeel A 01/06/2022  5:32 PM

## 2022-01-06 NOTE — ED Notes (Signed)
Dr. Mansy @ the bedside. 

## 2022-01-06 NOTE — ED Notes (Signed)
Bg 246, pt took 15 units of Novolog at 230 pm

## 2022-01-06 NOTE — Progress Notes (Signed)
PHARMACIST - PHYSICIAN COMMUNICATION  CONCERNING:  Enoxaparin (Lovenox) for DVT Prophylaxis    RECOMMENDATION: Patient was prescribed enoxaprin '40mg'$  q24 hours for VTE prophylaxis.   Filed Weights   01/06/22 1627  Weight: 101.2 kg (223 lb)    Body mass index is 33.91 kg/m.  Estimated Creatinine Clearance: 48.5 mL/min (A) (by C-G formula based on SCr of 1.63 mg/dL (H)).   Based on [place] patient is candidate for enoxaparin 0.'5mg'$ /kg TBW SQ every 24 hours based on BMI being >30.  DESCRIPTION: Pharmacy has adjusted enoxaparin dose per [facility] policy.  Patient is now receiving enoxaparin 0.5 mg/kg every 24 hours   Renda Rolls, PharmD, [facility] 01/06/2022 9:48 PM

## 2022-01-06 NOTE — ED Provider Notes (Signed)
[facility] Provider Note    Event Date/Time   First MD Initiated Contact with Patient 01/06/22 1704     (approximate)   History   Nausea   HPI  Tanya Graves is a 56 y.o. female with past medical history of type 2 diabetes here with elevated blood sugar, dizziness, vomiting, leg cramps.  The patient states that over the last several days, she has had progressive worsening generalized weakness, nausea, vomiting.  It began fairly abruptly and has progressively worsened.  She has had bilateral leg cramps as well.  She has history of DKA with similar symptoms.  She states that this often happens when she gets an infection and she initially did not necessarily recall any areas of infection, but she did have an area of redness on her lower abdomen that began to become mildly painful earlier today.  She was able to express a small amount of purulence from it.  She has history of recurrent folliculitis.  Denies any fevers.  She has been taking her insulin as prescribed.  No other recent medication changes.     Physical Exam   Triage Vital Signs: ED Triage Vitals  Enc Vitals Group     BP 01/06/22 1626 96/75     Pulse Rate 01/06/22 1626 (!) 128     Resp 01/06/22 1626 (!) 22     Temp 01/06/22 1626 98.7 F (37.1 C)     Temp Source 01/06/22 1626 Oral     SpO2 01/06/22 1626 98 %     Weight 01/06/22 1627 223 lb (101.2 kg)     Height 01/06/22 1627 '5\' 8"'$  (1.727 m)     Head Circumference --      Peak Flow --      Pain Score 01/06/22 1627 0     Pain Loc --      Pain Edu? --      Excl. in Foley? --     Most recent vital signs: Vitals:   01/06/22 2300 01/06/22 2330  BP: 110/67 116/67  Pulse: 80 77  Resp: 12 12  Temp:  98.6 F (37 C)  SpO2: 97% 96%     General: Awake, no distress.  CV:  Good peripheral perfusion.  Tachycardic.  Regular rhythm. Resp:  Normal effort.  Mild tachypnea.  Lungs clear. Abd:  No distention.  Small follicular area of erythema and  expressible small-volume purulence in the lower abdomen, no fluctuance noted. Other:  Dry mucous membranes.  Alert and oriented.   ED Results / Procedures / Treatments   Labs (all labs ordered are listed, but only abnormal results are displayed) Labs Reviewed  COMPREHENSIVE METABOLIC PANEL - Abnormal; Notable for the following components:      Result Value   Sodium 134 (*)    CO2 17 (*)    Glucose, Bld 231 (*)    BUN 33 (*)    Creatinine, Ser 1.63 (*)    Total Bilirubin 2.2 (*)    GFR, Estimated 37 (*)    Anion gap 16 (*)    All other components within normal limits  CBC - Abnormal; Notable for the following components:   WBC 14.2 (*)    Hemoglobin 15.1 (*)    All other components within normal limits  URINALYSIS, ROUTINE W REFLEX MICROSCOPIC - Abnormal; Notable for the following components:   Color, Urine YELLOW (*)    APPearance HAZY (*)    Specific Gravity, Urine >1.046 (*)  Glucose, UA >=500 (*)    Ketones, ur 80 (*)    Protein, ur 30 (*)    All other components within normal limits  BLOOD GAS, VENOUS - Abnormal; Notable for the following components:   pCO2, Ven 41 (*)    pO2, Ven <31 (*)    Acid-base deficit 4.1 (*)    All other components within normal limits  BETA-HYDROXYBUTYRIC ACID - Abnormal; Notable for the following components:   Beta-Hydroxybutyric Acid 4.21 (*)    All other components within normal limits  BASIC METABOLIC PANEL - Abnormal; Notable for the following components:   CO2 19 (*)    Glucose, Bld 185 (*)    BUN 28 (*)    Creatinine, Ser 1.02 (*)    Calcium 7.9 (*)    All other components within normal limits  BETA-HYDROXYBUTYRIC ACID - Abnormal; Notable for the following components:   Beta-Hydroxybutyric Acid 3.63 (*)    All other components within normal limits  BASIC METABOLIC PANEL - Abnormal; Notable for the following components:   CO2 20 (*)    Glucose, Bld 145 (*)    BUN 28 (*)    Creatinine, Ser 1.06 (*)    Calcium 8.4 (*)    All  other components within normal limits  CBG MONITORING, ED - Abnormal; Notable for the following components:   Glucose-Capillary 246 (*)    All other components within normal limits  CBG MONITORING, ED - Abnormal; Notable for the following components:   Glucose-Capillary 230 (*)    All other components within normal limits  CBG MONITORING, ED - Abnormal; Notable for the following components:   Glucose-Capillary 218 (*)    All other components within normal limits  CBG MONITORING, ED - Abnormal; Notable for the following components:   Glucose-Capillary 205 (*)    All other components within normal limits  CBG MONITORING, ED - Abnormal; Notable for the following components:   Glucose-Capillary 169 (*)    All other components within normal limits  CBG MONITORING, ED - Abnormal; Notable for the following components:   Glucose-Capillary 150 (*)    All other components within normal limits  CBG MONITORING, ED - Abnormal; Notable for the following components:   Glucose-Capillary 129 (*)    All other components within normal limits  CULTURE, BLOOD (ROUTINE X 2)  CULTURE, BLOOD (ROUTINE X 2)  LIPASE, BLOOD  LACTIC ACID, PLASMA  LACTIC ACID, PLASMA  HIV ANTIBODY (ROUTINE TESTING W REFLEX)  POC URINE PREG, ED  POC URINE PREG, ED  TROPONIN I (HIGH SENSITIVITY)  TROPONIN I (HIGH SENSITIVITY)     EKG Sinus tachycardia, ventricular 131.  PR 124, QRS 74, QTc 472.  No acute ST elevations or depressions.  No EKG evidence of acute ischemia or infarct.   RADIOLOGY CT abdomen/pelvis: Incidental adrenal nodule, no acute pathology otherwise   I also independently reviewed and agree with radiologist interpretations.   PROCEDURES:  Critical Care performed: Yes, see critical care procedure note(s)  .Critical Care  Performed by: Duffy Bruce, MD Authorized by: Duffy Bruce, MD   Critical care provider statement:    Critical care time (minutes):  30   Critical care time was exclusive  of:  Separately billable procedures and treating other patients   Critical care was necessary to treat or prevent imminent or life-threatening deterioration of the following conditions:  Cardiac failure and circulatory failure   Critical care was time spent personally by me on the following activities:  Development of  treatment plan with patient or surrogate, discussions with consultants, evaluation of patient's response to treatment, examination of patient, ordering and review of laboratory studies, ordering and review of radiographic studies, ordering and performing treatments and interventions, pulse oximetry, re-evaluation of patient's condition and review of old charts     [facility] ED: Medications  lactated ringers infusion (0 mLs Intravenous Hold 01/06/22 1925)  dextrose 5 % in lactated ringers infusion ( Intravenous New Bag/Given 01/06/22 1806)  dextrose 50 % solution 0-50 mL (has no administration in time range)  ondansetron (ZOFRAN) injection 4 mg (0 mg Intravenous Hold 01/06/22 1959)  atorvastatin (LIPITOR) tablet 20 mg (has no administration in time range)  levothyroxine (SYNTHROID) tablet 175 mcg (has no administration in time range)  tirzepatide (MOUNJARO) Pen 5 mg (5 mg Subcutaneous Not Given 01/06/22 2300)  enoxaparin (LOVENOX) injection 50 mg (has no administration in time range)  lactated ringers infusion (0 mLs Intravenous Hold 01/06/22 2300)  acetaminophen (TYLENOL) tablet 650 mg (has no administration in time range)    Or  acetaminophen (TYLENOL) suppository 650 mg (has no administration in time range)  traZODone (DESYREL) tablet 25 mg (has no administration in time range)  magnesium hydroxide (MILK OF MAGNESIA) suspension 30 mL (has no administration in time range)  ondansetron (ZOFRAN) tablet 4 mg (has no administration in time range)    Or  ondansetron (ZOFRAN) injection 4 mg (has no administration in time range)  insulin glargine-yfgn (SEMGLEE) injection 30  Units (has no administration in time range)  insulin glargine-yfgn (SEMGLEE) injection 10 Units (has no administration in time range)  ondansetron (ZOFRAN-ODT) disintegrating tablet 4 mg (4 mg Oral Given 01/06/22 1648)  lactated ringers bolus 1,000 mL (0 mLs Intravenous Stopped 01/06/22 2154)  lactated ringers bolus 1,000 mL (0 mLs Intravenous Stopped 01/06/22 2325)  ondansetron (ZOFRAN) injection 4 mg (4 mg Intravenous Given 01/06/22 1748)  potassium chloride 10 mEq in 100 mL IVPB (0 mEq Intravenous Stopped 01/06/22 2119)  sodium chloride 0.9 % bolus 500 mL (0 mLs Intravenous Stopped 01/06/22 1925)  ceFEPIme (MAXIPIME) 2 g in sodium chloride 0.9 % 100 mL IVPB (0 g Intravenous Stopped 01/06/22 1800)  vancomycin (VANCOCIN) IVPB 1000 mg/200 mL premix (0 mg Intravenous Stopped 01/06/22 2119)  iohexol (OMNIPAQUE) 300 MG/ML solution 80 mL (80 mLs Intravenous Contrast Given 01/06/22 1923)  morphine (PF) 4 MG/ML injection 4 mg (4 mg Intravenous Given 01/06/22 1959)  haloperidol lactate (HALDOL) injection 2.5 mg (2.5 mg Intravenous Given 01/06/22 2016)     IMPRESSION / MDM / ASSESSMENT AND PLAN / ED COURSE  I reviewed the triage vital signs and the nursing notes.                               The patient is on the cardiac monitor to evaluate for evidence of arrhythmia and/or significant heart rate changes.   Ddx:  Differential includes the following, with pertinent life- or limb-threatening emergencies considered:  DKA, gastritis, pancreatitis, cholecystitis, viral GI illness, sepsis, HHS  Patient's presentation is most consistent with acute presentation with potential threat to life or bodily function.  MDM:  56 year old female with history of diabetes here with nausea, vomiting, generalized weakness.  On exam, the patient appears to feel generally unwell but is mentating appropriately. Labs c/w DKA with CO2 17, AG 16, moderate DKA. pH actually wnl. LA normal. CT a/P shows no acute abnormality -  incidental adrenal nodule noted, which she  was made aware of. She has a small area of folliculitis on her lower abdomen, empiric ABX started but does not appear to be septic based on labs. IV insulin gtt started with fluids, admit to medicine. Mentating well.   MEDICATIONS GIVEN IN ED: Medications  lactated ringers infusion (0 mLs Intravenous Hold 01/06/22 1925)  dextrose 5 % in lactated ringers infusion ( Intravenous New Bag/Given 01/06/22 1806)  dextrose 50 % solution 0-50 mL (has no administration in time range)  ondansetron (ZOFRAN) injection 4 mg (0 mg Intravenous Hold 01/06/22 1959)  atorvastatin (LIPITOR) tablet 20 mg (has no administration in time range)  levothyroxine (SYNTHROID) tablet 175 mcg (has no administration in time range)  tirzepatide (MOUNJARO) Pen 5 mg (5 mg Subcutaneous Not Given 01/06/22 2300)  enoxaparin (LOVENOX) injection 50 mg (has no administration in time range)  lactated ringers infusion (0 mLs Intravenous Hold 01/06/22 2300)  acetaminophen (TYLENOL) tablet 650 mg (has no administration in time range)    Or  acetaminophen (TYLENOL) suppository 650 mg (has no administration in time range)  traZODone (DESYREL) tablet 25 mg (has no administration in time range)  magnesium hydroxide (MILK OF MAGNESIA) suspension 30 mL (has no administration in time range)  ondansetron (ZOFRAN) tablet 4 mg (has no administration in time range)    Or  ondansetron (ZOFRAN) injection 4 mg (has no administration in time range)  insulin glargine-yfgn (SEMGLEE) injection 30 Units (has no administration in time range)  insulin glargine-yfgn (SEMGLEE) injection 10 Units (has no administration in time range)  ondansetron (ZOFRAN-ODT) disintegrating tablet 4 mg (4 mg Oral Given 01/06/22 1648)  lactated ringers bolus 1,000 mL (0 mLs Intravenous Stopped 01/06/22 2154)  lactated ringers bolus 1,000 mL (0 mLs Intravenous Stopped 01/06/22 2325)  ondansetron (ZOFRAN) injection 4 mg (4 mg Intravenous Given  01/06/22 1748)  potassium chloride 10 mEq in 100 mL IVPB (0 mEq Intravenous Stopped 01/06/22 2119)  sodium chloride 0.9 % bolus 500 mL (0 mLs Intravenous Stopped 01/06/22 1925)  ceFEPIme (MAXIPIME) 2 g in sodium chloride 0.9 % 100 mL IVPB (0 g Intravenous Stopped 01/06/22 1800)  vancomycin (VANCOCIN) IVPB 1000 mg/200 mL premix (0 mg Intravenous Stopped 01/06/22 2119)  iohexol (OMNIPAQUE) 300 MG/ML solution 80 mL (80 mLs Intravenous Contrast Given 01/06/22 1923)  morphine (PF) 4 MG/ML injection 4 mg (4 mg Intravenous Given 01/06/22 1959)  haloperidol lactate (HALDOL) injection 2.5 mg (2.5 mg Intravenous Given 01/06/22 2016)     Consults:  Hospitalist   EMR reviewed  Reviewed Family Med office visits, recent OB visits for cervical dysplasia with Dr. Amalia Hailey     FINAL CLINICAL IMPRESSION(S) / ED DIAGNOSES   Final diagnoses:  Diabetic ketoacidosis without coma associated with type 2 diabetes mellitus ([place])  Folliculitis     Rx / DC Orders   ED Discharge Orders     None        Note:  This document was prepared using Dragon voice recognition software and may include unintentional dictation errors.   Duffy Bruce, MD 01/07/22 [phone]

## 2022-01-06 NOTE — Sepsis Progress Note (Signed)
Elink following code sepsis  25 MD and Bedside RN messaged, no lactic acid ordered

## 2022-01-07 DIAGNOSIS — L739 Follicular disorder, unspecified: Secondary | ICD-10-CM | POA: Diagnosis not present

## 2022-01-07 DIAGNOSIS — N179 Acute kidney failure, unspecified: Secondary | ICD-10-CM | POA: Diagnosis not present

## 2022-01-07 DIAGNOSIS — E039 Hypothyroidism, unspecified: Secondary | ICD-10-CM | POA: Diagnosis not present

## 2022-01-07 DIAGNOSIS — E1110 Type 2 diabetes mellitus with ketoacidosis without coma: Secondary | ICD-10-CM | POA: Diagnosis not present

## 2022-01-07 LAB — BASIC METABOLIC PANEL
Anion gap: 10 (ref 5–15)
Anion gap: 8 (ref 5–15)
BUN: 28 mg/dL — ABNORMAL HIGH (ref 6–20)
BUN: 23 mg/dL — ABNORMAL HIGH (ref 6–20)
CO2: 20 mmol/L — ABNORMAL LOW (ref 22–32)
CO2: 23 mmol/L (ref 22–32)
Calcium: 8.4 mg/dL — ABNORMAL LOW (ref 8.9–10.3)
Calcium: 8.5 mg/dL — ABNORMAL LOW (ref 8.9–10.3)
Chloride: 105 mmol/L (ref 98–111)
Chloride: 103 mmol/L (ref 98–111)
Creatinine, Ser: 1.06 mg/dL — ABNORMAL HIGH (ref 0.44–1.00)
Creatinine, Ser: 0.97 mg/dL (ref 0.44–1.00)
GFR, Estimated: >60 mL/min (ref >60)
GFR, Estimated: >60 mL/min (ref >60)
Glucose, Bld: 145 mg/dL — ABNORMAL HIGH (ref 70–99)
Glucose, Bld: 360 mg/dL — ABNORMAL HIGH (ref 70–99)
Potassium: 3.8 mmol/L (ref 3.5–5.1)
Potassium: 4.5 mmol/L (ref 3.5–5.1)
Sodium: 135 mmol/L (ref 135–145)
Sodium: 134 mmol/L — ABNORMAL LOW (ref 135–145)

## 2022-01-07 LAB — HIV ANTIBODY (ROUTINE TESTING W REFLEX): HIV Screen 4th Generation wRfx: NONREACTIVE

## 2022-01-07 LAB — CBG MONITORING, ED
Glucose-Capillary: 139 mg/dL — ABNORMAL HIGH (ref 70–99)
Glucose-Capillary: 143 mg/dL — ABNORMAL HIGH (ref 70–99)
Glucose-Capillary: 248 mg/dL — ABNORMAL HIGH (ref 70–99)

## 2022-01-07 MED ORDER — INSULIN ASPART 100 UNIT/ML IJ SOLN
0.0000 [IU] | Freq: Three times a day (TID) | INTRAMUSCULAR | Status: DC
  Administered 2022-01-07: 15 [IU] via SUBCUTANEOUS
  Filled 2022-01-07: qty 1

## 2022-01-07 MED ORDER — CEPHALEXIN 500 MG PO CAPS: 500.0000 mg | ORAL_CAPSULE | Freq: Two times a day (BID) | ORAL | 0 refills | Status: AC

## 2022-01-07 MED ORDER — INSULIN GLARGINE-YFGN 100 UNIT/ML ~~LOC~~ SOLN
10.0000 [IU] | Freq: Once | SUBCUTANEOUS | Status: AC
  Administered 2022-01-07: 10 [IU] via SUBCUTANEOUS
  Filled 2022-01-07: qty 0.1

## 2022-01-07 NOTE — Assessment & Plan Note (Addendum)
Patient received IV Rocephin and will be discharged home on a course of Keflex

## 2022-01-07 NOTE — Discharge Instructions (Signed)
Check blood sugars AC meals and document and take to your doctor's appointment Complete antibiotic therapy as prescribed Return to emergency room for worsening symptoms

## 2022-01-07 NOTE — Discharge Summary (Addendum)
Physician Discharge Summary   Patient: Tanya Graves MRN: [id number] DOB: January 12, 1966  Admit date:     01/06/2022  Discharge date: 01/16/22  Discharge Physician: Kylan Liberati   PCP: [name], MD   Recommendations at discharge:   Check blood sugars with meals and at bedtime Schedule follow-up appointment with your primary care provider and endocrinologist Return to emergency room for worsening symptoms  Discharge Diagnoses: Principal Problem:   DKA, type 2 ([place]) Active Problems:   AKI (acute kidney injury) ([place])   Folliculitis   Sepsis ([place])   Tobacco use   Hypothyroidism   Dyslipidemia  Resolved Problems:   * No resolved hospital problems. *  Hospital Course: RALENE Graves is a 56 y.o. Caucasian female with medical history significant for osteoarthritis, type diabetes mellitus and hypothyroidism, who presented to the emergency room with acute onset of hyperglycemia with associated nausea, vomiting, dizziness and leg cramps.  Her symptoms have been going on over the last few days and got significantly worse with associated generalized weakness.  She stated she has been taking her insulin but her blood pressure was elevated and she could not get it under control.  She has an erythematous area in her lower abdomen with associated pain and tenderness.  She was able to express a small amount of pus from it.  She admits to history of recurrent folliculitis.  She admits to polyuria and polydipsia.  No fever or chills.  She has been taking her medications and her insulin as prescribed.  No chest pain or palpitations but no cough or wheezing or dyspnea.  No dysuria, oliguria or hematuria or flank pain.   ED Course: When she came to the ER heart rate was 128 with a respiratory rate of 22, blood pressure of 96/75 and later 101/74 and otherwise normal vital signs.  Labs revealed VBG with pH 7.33 PCO2 41 and HCO3 of 21.6.  CMP revealed a sodium of 134 potassium 4.9 with CO2 of 17  anion gap of 16, BUN of 33 and creatinine 1.63 compared to 20/0.81 on 04/16/2021.  CRP was 1.6.  CBC showed leukocytosis of 14.2 and mild hemoconcentration. EKG as reviewed by me : EKG showed sinus tachycardia with a rate of 131 with right atrial enlargement. Imaging: Portable chest ray showed no acute cardiopulmonary disease.   The patient was given IV cefepime and vancomycin, 2.5 mg IV Haldol, 4 mg of IV morphine sulfate and 4 mg of IV Zofran, 2 L bolus of IV lactated Ringer and 500 mill IV normal saline bolus and was started on IV insulin drip per Endo tool DKA protocol.    Assessment and Plan: * DKA, type 2 ([name]) Resolved Anion gap has closed Continue long-acting insulin, Premeal insulin as well as weekly Tirzepatide Check blood sugars with meals and at bedtime  AKI (acute kidney injury) ([name]) Most likely prerenal and due to volume depletion and dehydration. Renal function improved with IV fluid hydration  Folliculitis Patient received IV Rocephin and will be discharged home on a course of Keflex  Dyslipidemia Continue statin therapy.  Hypothyroidism Continue Synthroid.  Tobacco use Smoking cessation counseling was discussed with patient in detail  Sepsis ([name]) Ruled out Patient had leukocytosis and was tachycardic on admission all related to her underlying DKA         Consultants: None Procedures performed: IV fluid hydration and IV insulin Disposition: Home Diet recommendation:  Carb modified diet DISCHARGE MEDICATION: Allergies as of 01/07/2022   No Known  Allergies      Medication List     STOP taking these medications    estradiol 0.1 MG/GM vaginal cream Commonly known as: ESTRACE       TAKE these medications    atorvastatin 20 MG tablet Commonly known as: LIPITOR Take 1 tablet (20 mg total) by mouth daily.   BD Insulin Syringe U/F 1/2Unit 31G X 5/16" 0.3 ML Misc Generic drug: Insulin Syringe-Needle U-100 USE WITH NOVOLOG THREE TIMES A  DAY   BD Pen Needle Micro U/F 32G X 6 MM Misc Generic drug: Insulin Pen Needle Inject 1 Device into the skin in the morning, at noon, in the evening, and at bedtime. 4x a day   Contour Next Test test strip Generic drug: glucose blood 1 each by Other route as directed. 6x daily due to hypoglycemia   FreeStyle Libre 2 Sensor Misc 1 Device by Does not apply route every 14 (fourteen) days.   levothyroxine 175 MCG tablet Commonly known as: SYNTHROID Take 1 tablet (175 mcg total) by mouth daily with breakfast.   meloxicam 15 MG tablet Commonly known as: MOBIC TAKE ONE TABLET BY MOUTH DAILY   NovoLOG 100 UNIT/ML injection Generic drug: insulin aspart Inject 15-17 Units into the skin 3 (three) times daily with meals.   tirzepatide 5 MG/0.5ML Pen Commonly known as: MOUNJARO Inject 5 mg into the skin once a week.   Toujeo SoloStar 300 UNIT/ML Solostar Pen Generic drug: insulin glargine (1 Unit Dial) Inject 30 Units into the skin daily.       ASK your doctor about these medications    cephALEXin 500 MG capsule Commonly known as: KEFLEX Take 1 capsule (500 mg total) by mouth 2 (two) times daily for 5 days. Ask about: Should I take this medication?        Follow-up Information     [name], MD Follow up in 5 day(s).   Specialty: Family Medicine Contact information: [place] [address] [phone]                Discharge Exam: Tanya Graves Weights   01/06/22 1627  Weight: 101.2 kg  GENERAL:  56 y.o.-year-old Caucasian female patient lying in the bed with no acute distress.  EYES: Pupils equal, round, reactive to light and accommodation. No scleral icterus. Extraocular muscles intact.  HEENT: Head atraumatic, normocephalic. Oropharynx and nasopharynx clear.  NECK:  Supple, no jugular venous distention. No thyroid enlargement, no tenderness.  LUNGS: Normal breath sounds bilaterally, no wheezing, rales,rhonchi or crepitation. No use of  accessory muscles of respiration.  CARDIOVASCULAR: Regular rate and rhythm, S1, S2 normal. No murmurs, rubs, or gallops.  ABDOMEN: Soft, nondistended, nontender. Bowel sounds present. No organomegaly or mass.  EXTREMITIES: No pedal edema, cyanosis, or clubbing.  NEUROLOGIC: Cranial nerves II through XII are intact. Muscle strength 5/5 in all extremities. Sensation intact. Gait not checked.  PSYCHIATRIC: The patient is alert and oriented x 3.  Normal affect and good eye contact. SKIN: Lower abdominal small abscess with associated folliculitis as shown below.   Condition at discharge: good  The results of significant diagnostics from this hospitalization (including imaging, microbiology, ancillary and laboratory) are listed below for reference.   Imaging Studies: CT ABDOMEN PELVIS W CONTRAST  Result Date: 01/06/2022 CLINICAL DATA:  Abdominal pain EXAM: CT ABDOMEN AND PELVIS WITH CONTRAST TECHNIQUE: Multidetector CT imaging of the abdomen and pelvis was performed using the standard protocol following bolus administration of intravenous contrast. RADIATION DOSE  REDUCTION: This exam was performed according to the departmental dose-optimization program which includes automated exposure control, adjustment of the mA and/or kV according to patient size and/or use of iterative reconstruction technique. CONTRAST:  63m OMNIPAQUE IOHEXOL 300 MG/ML  SOLN COMPARISON:  None Available. FINDINGS: Lower chest: No acute abnormality. Hepatobiliary: No suspicious focal liver abnormality is seen. No gallstones, gallbladder wall thickening, or biliary dilatation. Pancreas: Unremarkable. No pancreatic ductal dilatation or surrounding inflammatory changes. Spleen: Normal in size without focal abnormality. Adrenals/Urinary Tract: 3.1 cm left adrenal nodule. Unremarkable right adrenal gland. Kidneys are normal, without renal calculi, suspicious focal lesion, or hydronephrosis. Simple cyst in the left kidney not requiring  follow-up. Bladder is unremarkable. Stomach/Bowel: Stomach is within normal limits. The appendix is normal. No evidence of bowel wall thickening, distention, or inflammatory changes. Mild diverticulosis without diverticulitis. Vascular/Lymphatic: Mild aortic atherosclerosis. No suspicious adenopathy. Reproductive: Unremarkable. Other: No free intraperitoneal fluid or air. Musculoskeletal: No acute or significant osseous findings. IMPRESSION: 1. No acute abnormality in the abdomen or pelvis. 2. 3.1 cm left adrenal nodule may represent an adrenal adenoma however is indeterminate. Further evaluation with nonemergent outpatient adrenal protocol MRI or CT is recommended. These results were called by telephone at the time of interpretation on 01/06/2022 at 7:48 pm to provider CDuffy Bruce, who verbally acknowledged these results. Electronically Signed   By: TPlacido SouM.D.   On: 01/06/2022 20:04   DG Chest Portable 1 View  Result Date: 01/06/2022 CLINICAL DATA:  Chest pain EXAM: PORTABLE CHEST 1 VIEW COMPARISON:  12/21/2020 FINDINGS: The heart size and mediastinal contours are within normal limits. Both lungs are clear. The visualized skeletal structures are unremarkable. IMPRESSION: No active disease. Electronically Signed   By: MInez CatalinaM.D.   On: 01/06/2022 19:44    Microbiology: Results for orders placed or performed during the hospital encounter of 01/06/22  Blood culture (routine x 2)     Status: None   Collection Time: 01/06/22  5:32 PM   Specimen: BLOOD  Result Value Ref Range Status   Specimen Description BLOOD Blood Culture adequate volume  Final   Special Requests BOTTLES DRAWN AEROBIC AND ANAEROBIC FOREARM  Final   Culture   Final    NO GROWTH 5 DAYS [facility] [address] [place] [address]   Report Status 01/11/2022 FINAL  Final  Blood culture (routine x 2)     Status: None   Collection Time: 01/06/22  5:32 PM   Specimen: BLOOD  Result  Value Ref Range Status   Specimen Description BLOOD Blood Culture adequate volume  Final   Special Requests BOTTLES DRAWN AEROBIC AND ANAEROBIC BLOOD LEFT ARM  Final   Culture   Final    NO GROWTH 5 DAYS [facility] [address] [place] [address]   Report Status 01/11/2022 FINAL  Final    Labs: CBC: No results for input(s): "WBC", "NEUTROABS", "HGB", "HCT", "MCV", "PLT" in the last 168 hours.  Basic Metabolic Panel: No results for input(s): "NA", "K", "CL", "CO2", "GLUCOSE", "BUN", "CREATININE", "CALCIUM", "MG", "PHOS" in the last 168 hours.  Liver Function Tests: No results for input(s): "AST", "ALT", "ALKPHOS", "BILITOT", "PROT", "ALBUMIN" in the last 168 hours.  CBG: No results for input(s): "GLUCAP" in the last 168 hours.   Discharge time spent: greater than 30 minutes.  Signed: TCollier Bullock MD [facility] 01/16/2022

## 2022-01-09 ENCOUNTER — Ambulatory Visit: Payer: Self-pay

## 2022-01-09 NOTE — Patient Outreach (Signed)
  Care Coordination   Initial Visit Note   01/09/2022 Name: Tanya Graves MRN: [id number] DOB: 03/18/1966  Tanya Graves is a 56 y.o. year old female who sees Einar Pheasant, Jobe Marker, MD for primary care. I spoke with  [name] by phone today  What matters to the patients health and wellness today?  Diabetes Management   Goals Addressed             This Visit's Progress    Diabetes Management       Care Coordination Interventions: Reviewed plan for diabetes management. Reports adherence to treatment plan. Reviewed s/sx of hypoglycemia and hyperglycemia along with recommended interventions. Discussed nutritional intake. Doing very well with weight management and nutritional compliance. Reports successfully losing 75 lbs within the last 8 months. Reviewed pending appointments. Attending medical appointments as scheduled. Annual physical up to date. Completed on 10/03/2021.          SDOH assessments and interventions completed:  Yes  SDOH Interventions Today    Flowsheet Row Most Recent Value  SDOH Interventions   Food Insecurity Interventions Intervention Not Indicated  Transportation Interventions Intervention Not Indicated        Care Coordination Interventions Activated:  Yes  Care Coordination Interventions:  Yes, provided   Follow up plan: Follow up call scheduled for January 24, 2022.    Encounter Outcome:  Pt. Visit Completed   [facility] [phone]

## 2022-01-10 ENCOUNTER — Other Ambulatory Visit (HOSPITAL_COMMUNITY)
Admission: RE | Admit: 2022-01-10 | Discharge: 2022-01-10 | Disposition: A | Discharge status: Home / Self Care | Payer: BC Managed Care – PPO | Source: Ambulatory Visit | Attending: Obstetrics and Gynecology | Admitting: Obstetrics and Gynecology

## 2022-01-10 ENCOUNTER — Ambulatory Visit (INDEPENDENT_AMBULATORY_CARE_PROVIDER_SITE_OTHER): Payer: BC Managed Care – PPO | Admitting: Obstetrics and Gynecology

## 2022-01-10 ENCOUNTER — Encounter: Payer: Self-pay | Admitting: Obstetrics and Gynecology

## 2022-01-10 VITALS — BP 122/88 | Ht 68.0 in | Wt 231.4 lb

## 2022-01-10 DIAGNOSIS — N870 Mild cervical dysplasia: Secondary | ICD-10-CM | POA: Diagnosis not present

## 2022-01-10 DIAGNOSIS — N951 Menopausal and female climacteric states: Secondary | ICD-10-CM

## 2022-01-10 NOTE — Progress Notes (Signed)
Patient presents for LEEP procedure today. She states she is anxious about the procedure but states no additional questions or concerns.

## 2022-01-10 NOTE — Progress Notes (Signed)
HPI:      [name] is a 56 y.o. G1P0010 who LMP was Patient's last menstrual period was 06/28/2015 (approximate).  Subjective:   She presents today for LEEP She was found to have persistent CIN-1 of the external and cervical canal.  Patient has been using vaginal estrogen cream and feels as if it has made a difference in her ability to tolerate vaginal examinations.    Hx: The following portions of the patient's history were reviewed and updated as appropriate:             She  has a past medical history of Arthritis, Diabetes mellitus without complication ([place]), and Thyroid disease. She does not have any pertinent problems on file. She  has a past surgical history that includes Rotator cuff repair (Right, 2015); Knee arthroscopy w/ meniscal repair (Right, 2008); Irrigation and debridement abscess (N/A, 01/24/2015); Tonsillectomy (1998); Appendectomy (1993); Calcaneal osteotomy (Left, 07/17/2016); Colonoscopy with propofol (N/A, 12/18/2018); and Colonoscopy (12/18/2018). Her family history includes Alcohol abuse in her maternal grandmother; Arthritis in her mother; Breast cancer in her maternal great-grandmother; Cancer in her paternal grandfather; Diabetes in her brother, father, mother, and paternal grandmother; Heart failure in her father, maternal grandmother, and paternal grandfather; Hypertension in her brother; Kidney failure in her paternal grandmother; Transient ischemic attack in her mother. She  reports that she has been smoking cigarettes. She has a 7.50 pack-year smoking history. She has never used smokeless tobacco. She reports current alcohol use. She reports that she does not use drugs. She has a current medication list which includes the following prescription(s): atorvastatin, bd insulin syringe u/f 1/2unit, cephalexin, freestyle libre 2 sensor, estradiol, contour next test, toujeo solostar, bd pen needle micro u/f, levothyroxine, meloxicam, novolog, and tirzepatide. She  has No Known Allergies.       Review of Systems:  Review of Systems  Constitutional: Denied constitutional symptoms, night sweats, recent illness, fatigue, fever, insomnia and weight loss.  Eyes: Denied eye symptoms, eye pain, photophobia, vision change and visual disturbance.  Ears/Nose/Throat/Neck: Denied ear, nose, throat or neck symptoms, hearing loss, nasal discharge, sinus congestion and sore throat.  Cardiovascular: Denied cardiovascular symptoms, arrhythmia, chest pain/pressure, edema, exercise intolerance, orthopnea and palpitations.  Respiratory: Denied pulmonary symptoms, asthma, pleuritic pain, productive sputum, cough, dyspnea and wheezing.  Gastrointestinal: Denied, gastro-esophageal reflux, melena, nausea and vomiting.  Genitourinary: Denied genitourinary symptoms including symptomatic vaginal discharge, pelvic relaxation issues, and urinary complaints.  Musculoskeletal: Denied musculoskeletal symptoms, stiffness, swelling, muscle weakness and myalgia.  Dermatologic: Denied dermatology symptoms, rash and scar.  Neurologic: Denied neurology symptoms, dizziness, headache, neck pain and syncope.  Psychiatric: Denied psychiatric symptoms, anxiety and depression.  Endocrine: Denied endocrine symptoms including hot flashes and night sweats.   Meds:   Current Outpatient Medications on File Prior to Visit  Medication Sig Dispense Refill   atorvastatin (LIPITOR) 20 MG tablet Take 1 tablet (20 mg total) by mouth daily. 90 tablet 3   BD INSULIN SYRINGE U/F 1/2UNIT 31G X 5/16" 0.3 ML MISC USE WITH NOVOLOG THREE TIMES A DAY 100 each 1   cephALEXin (KEFLEX) 500 MG capsule Take 1 capsule (500 mg total) by mouth 2 (two) times daily for 5 days. 10 capsule 0   Continuous Blood Gluc Sensor (FREESTYLE LIBRE 2 SENSOR) MISC 1 Device by Does not apply route every 14 (fourteen) days. 6 each 3   estradiol (ESTRACE) 0.1 MG/GM vaginal cream Place 1 Applicatorful vaginally at bedtime.     glucose blood  (CONTOUR  NEXT TEST) test strip 1 each by Other route as directed. 6x daily due to hypoglycemia 600 each 3   insulin glargine, 1 Unit Dial, (TOUJEO SOLOSTAR) 300 UNIT/ML Solostar Pen Inject 30 Units into the skin daily.     Insulin Pen Needle (BD PEN NEEDLE MICRO U/F) 32G X 6 MM MISC Inject 1 Device into the skin in the morning, at noon, in the evening, and at bedtime. 4x a day 400 each 3   levothyroxine (SYNTHROID) 175 MCG tablet Take 1 tablet (175 mcg total) by mouth daily with breakfast. 90 tablet 3   meloxicam (MOBIC) 15 MG tablet TAKE ONE TABLET BY MOUTH DAILY 30 tablet 2   NOVOLOG 100 UNIT/ML injection Inject 15-17 Units into the skin 3 (three) times daily with meals.     tirzepatide [facility]) 5 MG/0.5ML Pen Inject 5 mg into the skin once a week. 6 mL 3   No current facility-administered medications on file prior to visit.      Objective:     Vitals:   01/10/22 1504  BP: 122/88   Filed Weights   01/10/22 1504  Weight: 231 lb 6.4 oz (105 kg)              LEEP The LEEP has been explained to the patient in detail; risks/benefits reviewed.  The risks include, but are not limited to, bleeding, infection, and the possibility of cervical stenosis or cervical incompetence.  The patient had previously been given information regarding abnormal PAP smears and their relationship to HPV.  We have discussed the natural course and history of HPV, the possibility of incomplete treatment by the LEEP, as well as the possibility of recurrence.  I have reviewed the consent form for LEEP with her, and she fully understands its contents.  We have discussed the procedure itself. I have informed her that following the LEEP she should refrain from intercourse and the use of tampons for three weeks, and that she should also expect some spotting and brown/black discharge over the next several days.  We have discussed the fact that vaginal bleeding, differentiated from spotting, is not normal and that if she  should have this complication, she should contact me immediately.  The follow-up after LEEP will be PAP smears or viral typing performed at regular intervals for up to 3 years.  Should these all prove to be normal, she will then be back on typical cervical screening.  I have answered all of her questions, and I believe she has an adequate understanding of the LEEP, its implications, and the necessity of follow-up care.  LEEP PROCEDURE NOTE  I again discussed her colpo results and explained the procedure of LEEP.  All questions were answered and she signed the consent form.  LEEP performed in the usual manner after reviewing the previous colpo findings and results. Lugol's solution was used to identify any abnormal areas of the cervix.  These were compared with the previous colpo pictures. The cervix was cleansed with betadine solution. Local injection of Lidocaine was performed for anesthesia. Coleman current was used to remove multiple labeled specimens.  It was labeled accordingly. The base and edges of the defect was then cauterized using coagulation current. Monsel's solution was then applied in the usual manner.             Assessment:    G1P0010 Patient Active Problem List   Diagnosis Date Noted   Folliculitis 59/16/3846   DKA, type 2 ([place]) 01/06/2022  Dyslipidemia 01/06/2022   AKI (acute kidney injury) ([place]) 01/06/2022   ASCUS with positive high risk HPV cervical 10/29/2021   OSA (obstructive sleep apnea) 08/27/2021   Abscess of right axilla 04/13/2021   Venous insufficiency 04/13/2021   Snoring 04/13/2021   Type 2 diabetes mellitus with ophthalmic complication ([place]) 59/29/2446   Type 2 diabetes mellitus with hyperglycemia, with long-term current use of insulin ([place]) 06/30/2020   Type 2 diabetes mellitus with diabetic polyneuropathy, with long-term current use of insulin ([place]) 09/10/2018   Tobacco use 07/22/2018   Hypothyroidism 07/22/2018   Hyperlipidemia associated  with type 2 diabetes mellitus ([place]) 07/22/2018   Poorly controlled type 2 diabetes mellitus ([place]) 01/22/2015     1. CIN I (cervical intraepithelial neoplasia I)   2. Vaginal dryness, menopausal        Plan:            1.  Follow-up from LEEP in 4 weeks Orders No orders of the defined types were placed in this encounter.   No orders of the defined types were placed in this encounter.     F/U  No follow-ups on file.  Finis Bud, M.D. 01/10/2022 5:11 PM

## 2022-01-11 LAB — CULTURE, BLOOD (ROUTINE X 2)
Culture: NO GROWTH
Culture: NO GROWTH
Specimen Description: ADEQUATE
Specimen Description: ADEQUATE

## 2022-01-14 LAB — SURGICAL PATHOLOGY

## 2022-01-14 NOTE — Patient Instructions (Signed)
Visit Information  Thank you for allowing the Care Management team to participate in your care. It was great speaking with you!  Following are the goals we discussed:   Goals Addressed             This Visit's Progress    Diabetes Management       Care Coordination Interventions: Reviewed plan for diabetes management. Reports adherence to treatment plan. Reviewed s/sx of hypoglycemia and hyperglycemia along with recommended interventions. Discussed nutritional intake. Doing very well with weight management and nutritional compliance. Reports successfully losing 75 lbs within the last 8 months. Reviewed pending appointments. Attending medical appointments as scheduled. Annual physical up to date. Completed on 10/03/2021.         Our next appointment is by telephone on January 24, 2022 at 1130. Please call the care guide team at [phone] if you need to cancel or reschedule your appointment.    Ms. Jezek verbalized understanding of information discussed during the telephonic outreach. Declined need for mailed instructions and resources.   A member of the care management team will follow up later this month.   [facility] [phone]

## 2022-01-16 ENCOUNTER — Encounter: Payer: Self-pay | Admitting: Obstetrics and Gynecology

## 2022-01-16 NOTE — Assessment & Plan Note (Signed)
Ruled out Patient had leukocytosis and was tachycardic on admission all related to her underlying DKA

## 2022-01-21 ENCOUNTER — Other Ambulatory Visit: Payer: Self-pay | Admitting: Internal Medicine

## 2022-01-24 ENCOUNTER — Ambulatory Visit: Payer: Self-pay

## 2022-01-24 NOTE — Patient Outreach (Signed)
Care Coordination   Follow Up Visit Note   01/24/2022 Name: SHAYLI FLOBERG MRN: [id number] DOB: 19-Feb-1966  SOTERIA RODEN is a 56 y.o. year old female who sees Selena Batten, Chryl Heck, MD for primary care. I spoke with  Achille Rich by phone today.  What matters to the patients health and wellness today?  Post LEEP     SDOH assessments and interventions completed:  No     Care Coordination Interventions Activated:  Yes  Care Coordination Interventions:  Yes, provided     Encounter Outcome:  Pt. Visit Completed   [facility] [phone]

## 2022-02-05 ENCOUNTER — Ambulatory Visit
Admission: RE | Admit: 2022-02-05 | Discharge: 2022-02-05 | Disposition: A | Discharge status: Home / Self Care | Payer: BC Managed Care – PPO | Source: Ambulatory Visit | Attending: Obstetrics and Gynecology | Admitting: Obstetrics and Gynecology

## 2022-02-05 DIAGNOSIS — Z01419 Encounter for gynecological examination (general) (routine) without abnormal findings: Secondary | ICD-10-CM

## 2022-02-14 ENCOUNTER — Encounter: Payer: Self-pay | Admitting: Obstetrics and Gynecology

## 2022-02-14 ENCOUNTER — Ambulatory Visit (INDEPENDENT_AMBULATORY_CARE_PROVIDER_SITE_OTHER): Payer: BC Managed Care – PPO | Admitting: Obstetrics and Gynecology

## 2022-02-14 VITALS — BP 120/70 | Ht 68.0 in | Wt 223.2 lb

## 2022-02-14 DIAGNOSIS — N870 Mild cervical dysplasia: Secondary | ICD-10-CM | POA: Diagnosis not present

## 2022-02-14 DIAGNOSIS — R42 Dizziness and giddiness: Secondary | ICD-10-CM

## 2022-02-14 NOTE — Progress Notes (Signed)
HPI:      [name] is a 56 y.o. G1P0010 who LMP was Patient's last menstrual period was 06/28/2015 (approximate).  Subjective:   She presents today for follow-up after LEEP.  She reports that she had a vaginal discharge for a while but this is resolving.  She would like to restart her estrogen cream which was she was using for vaginal atrophy. In addition she has recently noticed lightheadedness immediately upon standing.  She reports no excessive bleeding.  She does state that her diabetes is somewhat out of control sometimes her blood sugars are 140 sometimes they are 400.  She is using insulin as directed but does not feel it is controlling her sugars well.  She has an appointment with her endocrinologist next week.    Hx: The following portions of the patient's history were reviewed and updated as appropriate:             She  has a past medical history of Arthritis, Diabetes mellitus without complication ([place]), and Thyroid disease. She does not have any pertinent problems on file. She  has a past surgical history that includes Rotator cuff repair (Right, 2015); Knee arthroscopy w/ meniscal repair (Right, 2008); Irrigation and debridement abscess (N/A, 01/24/2015); Tonsillectomy (1998); Appendectomy (1993); Calcaneal osteotomy (Left, 07/17/2016); Colonoscopy with propofol (N/A, 12/18/2018); and Colonoscopy (12/18/2018). Her family history includes Alcohol abuse in her maternal grandmother; Arthritis in her mother; Breast cancer in her maternal great-grandmother; Cancer in her paternal grandfather; Diabetes in her brother, father, mother, and paternal grandmother; Heart failure in her father, maternal grandmother, and paternal grandfather; Hypertension in her brother; Kidney failure in her paternal grandmother; Transient ischemic attack in her mother. She  reports that she has been smoking cigarettes. She has a 7.50 pack-year smoking history. She has never used smokeless tobacco. She  reports current alcohol use. She reports that she does not use drugs. She has a current medication list which includes the following prescription(s): atorvastatin, bd insulin syringe u/f 1/2unit, freestyle libre 2 sensor, estradiol, contour next test, toujeo solostar, bd pen needle micro u/f, levothyroxine, meloxicam, novolog, and tirzepatide. She has No Known Allergies.       Review of Systems:  Review of Systems  Constitutional: Denied constitutional symptoms, night sweats, recent illness, fatigue, fever, insomnia and weight loss.  Eyes: Denied eye symptoms, eye pain, photophobia, vision change and visual disturbance.  Ears/Nose/Throat/Neck: Denied ear, nose, throat or neck symptoms, hearing loss, nasal discharge, sinus congestion and sore throat.  Cardiovascular: Denied cardiovascular symptoms, arrhythmia, chest pain/pressure, edema, exercise intolerance, orthopnea and palpitations.  Respiratory: Denied pulmonary symptoms, asthma, pleuritic pain, productive sputum, cough, dyspnea and wheezing.  Gastrointestinal: Denied, gastro-esophageal reflux, melena, nausea and vomiting.  Genitourinary: Denied genitourinary symptoms including symptomatic vaginal discharge, pelvic relaxation issues, and urinary complaints.  Musculoskeletal: Denied musculoskeletal symptoms, stiffness, swelling, muscle weakness and myalgia.  Dermatologic: Denied dermatology symptoms, rash and scar.  Neurologic: Orthostatic lightheadedness  Psychiatric: Denied psychiatric symptoms, anxiety and depression.  Endocrine: Denied endocrine symptoms including hot flashes and night sweats.   Meds:   Current Outpatient Medications on File Prior to Visit  Medication Sig Dispense Refill   atorvastatin (LIPITOR) 20 MG tablet Take 1 tablet (20 mg total) by mouth daily. 90 tablet 3   BD INSULIN SYRINGE U/F 1/2UNIT 31G X 5/16" 0.3 ML MISC USE WITH NOVOLOG THREE TIMES A DAY 100 each 1   Continuous Blood Gluc Sensor (FREESTYLE LIBRE 2  SENSOR) MISC 1 Device by Does not  apply route every 14 (fourteen) days. 6 each 3   estradiol (ESTRACE) 0.1 MG/GM vaginal cream Place 1 Applicatorful vaginally at bedtime.     glucose blood (CONTOUR NEXT TEST) test strip 1 each by Other route as directed. 6x daily due to hypoglycemia 600 each 3   insulin glargine, 1 Unit Dial, (TOUJEO SOLOSTAR) 300 UNIT/ML Solostar Pen Inject 30 Units into the skin daily.     Insulin Pen Needle (BD PEN NEEDLE MICRO U/F) 32G X 6 MM MISC Inject 1 Device into the skin in the morning, at noon, in the evening, and at bedtime. 4x a day 400 each 3   levothyroxine (SYNTHROID) 175 MCG tablet Take 1 tablet (175 mcg total) by mouth daily with breakfast. 90 tablet 3   meloxicam (MOBIC) 15 MG tablet TAKE ONE TABLET BY MOUTH DAILY 30 tablet 2   NOVOLOG 100 UNIT/ML injection INJECT A MAXIMUM OF OF 60 UNITS UNDER THE SKIN DAILY 10 mL 2   tirzepatide (MOUNJARO) 5 MG/0.5ML Pen Inject 5 mg into the skin once a week. 6 mL 3   No current facility-administered medications on file prior to visit.      Objective:     Vitals:   02/14/22 1527  BP: 120/70   Filed Weights   02/14/22 1527  Weight: 223 lb 3.2 oz (101.2 kg)                        Assessment:    G1P0010 Patient Active Problem List   Diagnosis Date Noted   Folliculitis 40/97/3532   DKA, type 2 ([place]) 01/06/2022   Dyslipidemia 01/06/2022   AKI (acute kidney injury) ([place]) 01/06/2022   ASCUS with positive high risk HPV cervical 10/29/2021   OSA (obstructive sleep apnea) 08/27/2021   Abscess of right axilla 04/13/2021   Venous insufficiency 04/13/2021   Snoring 04/13/2021   Type 2 diabetes mellitus with ophthalmic complication ([place]) 99/24/2683   Type 2 diabetes mellitus with hyperglycemia, with long-term current use of insulin ([place]) 06/30/2020   Type 2 diabetes mellitus with diabetic polyneuropathy, with long-term current use of insulin ([place]) 09/10/2018   Tobacco use 07/22/2018   Hypothyroidism 07/22/2018    Hyperlipidemia associated with type 2 diabetes mellitus ([place]) 07/22/2018   Sepsis ([place]) 01/22/2015   Poorly controlled type 2 diabetes mellitus ([place]) 01/22/2015     1. CIN I (cervical intraepithelial neoplasia I)   2. Orthostatic lightheadedness        Plan:            1.  Discussed diagnosis following LEEP.  Recommend follow-up Pap in 10 months.  2.  CMP, CBC, hemoglobin A1c today.  Recommend close follow-up by endocrinologist next week.  3.  May resume vaginal estrogen cream Orders Orders Placed This Encounter  Procedures   HgB A1c   CBC With Diff/Platelet   Comp Met (CMET)    No orders of the defined types were placed in this encounter.     F/U  Return in about 10 months (around 12/15/2022). I spent 25 minutes involved in the care of this patient preparing to see the patient by obtaining and reviewing her medical history (including labs, imaging tests and prior procedures), documenting clinical information in the electronic health record (EHR), counseling and coordinating care plans, writing and sending prescriptions, ordering tests or procedures and in direct communicating with the patient and medical staff discussing pertinent items from her history and physical exam.  Finis Bud, M.D. 02/14/2022  4:00 PM

## 2022-02-14 NOTE — Progress Notes (Signed)
Patient presents today for colposcopy. No additional concerns related.

## 2022-02-15 LAB — CBC WITH DIFF/PLATELET
Basophils Absolute: 0.1 10*3/uL (ref 0.0–0.2)
Basos: 1 %
EOS (ABSOLUTE): 0.1 10*3/uL (ref 0.0–0.4)
Eos: 2 %
Hematocrit: 38.7 % (ref 34.0–46.6)
Hemoglobin: 12.5 g/dL (ref 11.1–15.9)
Immature Grans (Abs): 0 10*3/uL (ref 0.0–0.1)
Immature Granulocytes: 0 %
Lymphocytes Absolute: 2.1 10*3/uL (ref 0.7–3.1)
Lymphs: 35 %
MCH: 32.5 pg (ref 26.6–33.0)
MCHC: 32.3 g/dL (ref 31.5–35.7)
MCV: 101 fL — ABNORMAL HIGH (ref 79–97)
Monocytes Absolute: 0.3 10*3/uL (ref 0.1–0.9)
Monocytes: 4 %
Neutrophils Absolute: 3.4 10*3/uL (ref 1.4–7.0)
Neutrophils: 58 %
Platelets: 226 10*3/uL (ref 150–450)
RBC: 3.85 x10E6/uL (ref 3.77–5.28)
RDW: 11.4 % — ABNORMAL LOW (ref 11.7–15.4)
WBC: 5.9 10*3/uL (ref 3.4–10.8)

## 2022-02-15 LAB — HEMOGLOBIN A1C
Est. average glucose Bld gHb Est-mCnc: 318 mg/dL
Hgb A1c MFr Bld: 12.7 % — ABNORMAL HIGH (ref 4.8–5.6)

## 2022-02-17 NOTE — Progress Notes (Signed)
Her significantly elevated hemoglobin A1c is as we suspected.  She will not be surprised.  She has an appointment with endocrinology this week.  This may be contributing to her orthostatic hypotension in some way.  Her hemoglobin is fine.  She should be strongly encouraged to see her endocrinologist and to manage her sugars much more effectively if possible.

## 2022-02-25 ENCOUNTER — Encounter: Payer: Self-pay | Admitting: Internal Medicine

## 2022-02-25 ENCOUNTER — Ambulatory Visit (INDEPENDENT_AMBULATORY_CARE_PROVIDER_SITE_OTHER): Payer: BC Managed Care – PPO | Admitting: Internal Medicine

## 2022-02-25 VITALS — BP 140/82 | HR 90 | Ht 68.0 in | Wt 230.0 lb

## 2022-02-25 DIAGNOSIS — E1142 Type 2 diabetes mellitus with diabetic polyneuropathy: Secondary | ICD-10-CM

## 2022-02-25 DIAGNOSIS — Z794 Long term (current) use of insulin: Secondary | ICD-10-CM

## 2022-02-25 DIAGNOSIS — E039 Hypothyroidism, unspecified: Secondary | ICD-10-CM | POA: Diagnosis not present

## 2022-02-25 DIAGNOSIS — E1165 Type 2 diabetes mellitus with hyperglycemia: Secondary | ICD-10-CM | POA: Diagnosis not present

## 2022-02-25 LAB — TSH: TSH: 1.09 u[IU]/mL (ref 0.35–5.50)

## 2022-02-25 MED ORDER — TIRZEPATIDE 7.5 MG/0.5ML ~~LOC~~ SOAJ: 7.5000 mg | SUBCUTANEOUS | 3 refills | Status: DC

## 2022-02-25 NOTE — Patient Instructions (Addendum)
-   Increase  Mounjaro 7.5 mg weekly  - Increase Toujeo 26  units daily  - Novolog 18 units three times a day before each meal  - Take Novolog 8 units with a snack  - Novolog correctional insulin: ADD extra units on insulin to your meal-time Novolog dose if your blood sugars are higher than 155 . Use the scale below to help guide you:   Blood sugar before meal Number of units to inject  Less than 155 0 unit  156 -  180 1 units  181 -  205 2 units  206 -  230 3 units  231 -  255 4 units  256 -  280 5 units  281 -  305 6 units  306 -  330 7 units  331 -  355 8 units  356 - 380 9 units         HOW TO TREAT LOW BLOOD SUGARS (Blood sugar LESS THAN 70 MG/DL) Please follow the RULE OF 15 for the treatment of hypoglycemia treatment (when your (blood sugars are less than 70 mg/dL)   STEP 1: Take 15 grams of carbohydrates when your blood sugar is low, which includes:  3-4 GLUCOSE TABS  OR 3-4 OZ OF JUICE OR REGULAR SODA OR ONE TUBE OF GLUCOSE GEL    STEP 2: RECHECK blood sugar in 15 MINUTES STEP 3: If your blood sugar is still low at the 15 minute recheck --> then, go back to STEP 1 and treat AGAIN with another 15 grams of carbohydrates.

## 2022-02-25 NOTE — Progress Notes (Signed)
Name: Tanya Graves  Age/ Sex: 56 y.o., female   MRN/ DOB: [id number], 1965-06-05     PCP: [name], MD   Reason for Endocrinology Evaluation: Type 2 Diabetes Mellitus  Initial Endocrine Consultative Visit: 07/08/2018    PATIENT IDENTIFIER: Tanya Graves is a 56 y.o. female with a past medical history of T2DM, HTN, Dyslipidemia and hypothyroidism . The patient has followed with Endocrinology clinic since 07/08/2018 for consultative assistance with management of her diabetes.  DIABETIC HISTORY:  Tanya Graves was diagnosed with T2DM ~ 2010.  She does not recall intolerance to other oral glycemic agents.  On her initial visit here she was on MDI regimen of insulin and metformin. Her hemoglobin A1c has ranged from 11.4%  in 2016, peaking at 12.5% in 2019  GLP-1 and SGLT-2 inhibitors have been cost prohibitive    She has been off metformin since October 2022     Thyroid Disease   She is S/P RAI ablation years ago > 20 yrs ago and has been on levothyroxine . She is on Levothyroxine 175 mcg daily   SUBJECTIVE:   During the last visit (08/15/2021): A1c 11.2% .   Today (02/25/2022): [name] for a follow-up on diabetes management.  She stopped using freestyle libre due to inaccuracy, she is on finger sticks now 2-3 times daily. Dexcom cost prohibitive . The patient has not had hypoglycemic episodes since the last clinic visit.   Presented to the ED with nausea on January 06, 2022 was found to be in DKA, she was started on antibiotic for abdominal folliculitis  Patient tells me that she contacted Tanya Graves back in March for hypoglycemia but she did not get a response, I did remind the patient that I have personally called her over the phone and discussed reducing her basal insulin, patient does not recall ?  She is S/P LEEP  Has been drinking  Mango smoothies for lunch without novolog  She has been dizzy with standing up , has noted SBP 90's mmhg   She denies any side effects to  [facility]   She eats Brunch , glucerna shake (1 g CHO) and supper    HOME DIABETES REGIMEN:  Mounjaro 5 mg weekly  Toujeo 24 units daily  NovoLog  15 units 3 times daily before every meal CF : Novolog ( BG-130/25)       METER DOWNLOAD SUMMARY: 9/3-10/06/2021 Fingerstick Blood Glucose Tests = 43 Overall Mean FS Glucose =357 Standard Deviation = 141  BG Ranges: Low = 85 High = > 500    Hypoglycemic Events/30 Days: BG < 50 = 1 Episodes of symptomatic severe hypoglycemia = 0   DIABETIC COMPLICATIONS: Microvascular complications:  Neuropathy , worsening DR Denies: CKD  Last eye exam: Completed  07/17/2020   Macrovascular complications:    Denies: CAD, PVD, CVA    HISTORY:  Past Medical History:  Past Medical History:  Diagnosis Date   Arthritis    Diabetes mellitus without complication ([place])    type 2   Thyroid disease    Past Surgical History:  Past Surgical History:  Procedure Laterality Date   APPENDECTOMY  1993   CALCANEAL OSTEOTOMY Left 07/17/2016   Procedure: CALCANEAL OSTEOTOMY;  Surgeon: [name], MD;  Location: [facility] ORS;  Service: Orthopedics;  Laterality: Left;   COLONOSCOPY  12/18/2018   found 1 31m polyp, going back in 5 years    COLONOSCOPY WITH PROPOFOL N/A 12/18/2018   Procedure: COLONOSCOPY WITH PROPOFOL;  Surgeon: [name], MD;  Location: [facility] ENDOSCOPY;  Service: Gastroenterology;  Laterality: N/A;   IRRIGATION AND DEBRIDEMENT ABSCESS N/A 01/24/2015   Procedure: IRRIGATION AND DEBRIDEMENT ABSCESS/GROIN ABSCESS;  Surgeon: [name], MD;  Location: [facility] ORS;  Service: General;  Laterality: N/A;   KNEE ARTHROSCOPY W/ MENISCAL REPAIR Right 2008   ROTATOR CUFF REPAIR Right 2015   TONSILLECTOMY  1998   Social History:  reports that she has been smoking cigarettes. She has a 7.50 pack-year smoking history. She has never used smokeless tobacco. She reports current alcohol use. She reports that she does not use drugs. Family  History:  Family History  Problem Relation Age of Onset   Diabetes Father    Heart failure Father    Diabetes Brother    Arthritis Mother    Diabetes Mother    Transient ischemic attack Mother    Heart failure Maternal Grandmother    Alcohol abuse Maternal Grandmother    Diabetes Paternal Grandmother    Kidney failure Paternal Grandmother    Heart failure Paternal Grandfather    Cancer Paternal Grandfather        unknown type   Hypertension Brother    Breast cancer Maternal Great-grandmother     OBJECTIVE:   Vital Signs:BP (!) 140/82 (BP Location: Left Arm, Patient Position: Sitting, Cuff Size: Large)   Pulse 90   Ht '5\' 8"'$  (1.727 m)   Wt 230 lb (104.3 kg)   LMP 06/28/2015 (Approximate)   SpO2 99%   BMI 34.97 kg/m   Wt Readings from Last 3 Encounters:  02/14/22 223 lb 3.2 oz (101.2 kg)  01/10/22 231 lb 6.4 oz (105 kg)  01/06/22 223 lb (101.2 kg)     Exam: General: Pt appears well and is in NAD  Lungs: Clear with good BS bilat   Heart: RRR   Extremities: No pretibial edema.   Neuro: MS is good with appropriate affect, pt is alert and Ox3   DM foot exam: 08/15/2021 The skin of the feet is intact without sores or ulcerations. The pedal pulses are 2+ on right and 2+ on left. The sensation is intact to a screening 5.07, 10 gram monofilament bilaterally     DATA REVIEWED:  Lab Results  Component Value Date   HGBA1C 12.7 (H) 02/14/2022   HGBA1C 11.2 (A) 08/15/2021   HGBA1C 10.8 (A) 04/16/2021    Latest Reference Range & Units 02/25/22 12:06  TSH 0.35 - 5.50 uIU/mL 1.09      Latest Reference Range & Units 01/07/22 10:02  Sodium 135 - 145 mmol/L 134 (L)  Potassium 3.5 - 5.1 mmol/L 4.5  Chloride 98 - 111 mmol/L 103  CO2 22 - 32 mmol/L 23  Glucose 70 - 99 mg/dL 360 (H)  BUN 6 - 20 mg/dL 23 (H)  Creatinine 0.44 - 1.00 mg/dL 0.97  Calcium 8.9 - 10.3 mg/dL 8.5 (L)  Anion gap 5 - 15  8  GFR, Estimated >60 mL/min >60     ASSESSMENT / PLAN /  RECOMMENDATIONS:  1)Type 2 Diabetes Mellitus, Poorly controlled, With Neuropathic and retinopathic complications - Most recent A1c of 12.7%. Goal A1c < 7.0 %.     -Patient continues with hyperglycemia -Dexcom has become  cost prohibitive, and freestyle Elenor Legato has been inadequate, now she is using fingersticks but it has been inconsistent - Historically SGLT-2 inhibitors ( farxiga in 2020) and GLP-1 agonists ( Rybelsus in 2021) have been cost prohibitive  - Per pt insulin pump cost-prohibitive in the  past  -She is able to obtain [facility] through coupons, will increase as below -Intolerant to metformin -We will check for GAD-65 and islet cell antibodies  MEDICATIONS: Increase Mounjaro 7.5 mg weekly  Increase Toujeo 26 units daily  Change NovoLog  18 units 3 times daily before every meal Take NovoLog 8 units with a snack Continue  CF : Novolog ( BG-130/25)     EDUCATION / INSTRUCTIONS: BG monitoring instructions: Patient is instructed to check her blood sugars 4 times a day, before meals and bedtime. Call [facility] clinic if: BG persistently < 70  I reviewed the Rule of 15 for the treatment of hypoglycemia in detail with the patient. Literature supplied.  2) Post ablative hypothyroidism:  -Patient is clinically euthyroid -No changes to the dose  Medication Continue levothyroxine 175 MCG daily      F/U in 4 months     Signed electronically by: Mack Guise, MD  [facility]  [address], [place] [address] Phone: [phone] FAX: [phone]   CC: [name], [place] [address] Phone: [phone]  Fax: [phone]  Return to Endocrinology clinic as below: Future Appointments  Date Time Provider [place]  02/25/2022 11:30 AM Cephas Revard, Melanie Crazier, MD [facility] None

## 2022-02-26 MED ORDER — LEVOTHYROXINE SODIUM 175 MCG PO TABS: 175.0000 ug | ORAL_TABLET | Freq: Every day | ORAL | 3 refills | Status: DC

## 2022-03-04 LAB — ISLET CELL AB SCREEN RFLX TO TITER: ISLET CELL ANTIBODY SCREEN: POSITIVE — AB

## 2022-03-04 LAB — GLUTAMIC ACID DECARBOXYLASE AUTO ABS: Glutamic Acid Decarb Ab: >250 IU/mL — ABNORMAL HIGH (ref <5)

## 2022-03-04 LAB — ISLET CELL AB/R TITER: Islet Cell Ab Titer: 80 JDF units — ABNORMAL HIGH

## 2022-03-11 ENCOUNTER — Other Ambulatory Visit: Payer: Self-pay | Admitting: Internal Medicine

## 2022-03-11 DIAGNOSIS — E1169 Type 2 diabetes mellitus with other specified complication: Secondary | ICD-10-CM

## 2022-03-26 ENCOUNTER — Encounter: Payer: Self-pay | Admitting: *Deleted

## 2022-03-26 ENCOUNTER — Ambulatory Visit (INDEPENDENT_AMBULATORY_CARE_PROVIDER_SITE_OTHER): Payer: BC Managed Care – PPO | Admitting: Family Medicine

## 2022-03-26 ENCOUNTER — Encounter: Payer: Self-pay | Admitting: Family Medicine

## 2022-03-26 VITALS — BP 130/64 | HR 97 | Temp 98.5°F | Ht 68.0 in | Wt 229.1 lb

## 2022-03-26 DIAGNOSIS — E039 Hypothyroidism, unspecified: Secondary | ICD-10-CM

## 2022-03-26 DIAGNOSIS — E1069 Type 1 diabetes mellitus with other specified complication: Secondary | ICD-10-CM | POA: Diagnosis not present

## 2022-03-26 DIAGNOSIS — K5900 Constipation, unspecified: Secondary | ICD-10-CM

## 2022-03-26 DIAGNOSIS — E1169 Type 2 diabetes mellitus with other specified complication: Secondary | ICD-10-CM | POA: Diagnosis not present

## 2022-03-26 DIAGNOSIS — E278 Other specified disorders of adrenal gland: Secondary | ICD-10-CM | POA: Insufficient documentation

## 2022-03-26 DIAGNOSIS — E108 Type 1 diabetes mellitus with unspecified complications: Secondary | ICD-10-CM | POA: Diagnosis not present

## 2022-03-26 DIAGNOSIS — B372 Candidiasis of skin and nail: Secondary | ICD-10-CM | POA: Insufficient documentation

## 2022-03-26 DIAGNOSIS — G4733 Obstructive sleep apnea (adult) (pediatric): Secondary | ICD-10-CM

## 2022-03-26 DIAGNOSIS — E785 Hyperlipidemia, unspecified: Secondary | ICD-10-CM

## 2022-03-26 MED ORDER — NYSTATIN 100000 UNIT/GM EX CREA: 1.0000 | TOPICAL_CREAM | Freq: Two times a day (BID) | CUTANEOUS | 0 refills | Status: DC

## 2022-03-26 NOTE — Assessment & Plan Note (Signed)
Stable, chronic.  Continue current medication.   Levo 175 mcg daily

## 2022-03-26 NOTE — Progress Notes (Signed)
Patient ID: Tanya Graves, female    DOB: [birth year], 56 y.o.   MRN: [id number]  This visit was conducted in person.  Pulse 97   Temp 98.5 F (36.9 C) (Oral)   Ht '5\' 8"'$  (1.727 m)   Wt 229 lb 2 oz (103.9 kg)   LMP 06/28/2015 (Approximate)   SpO2 96%   BMI 34.84 kg/m    BP Readings from Last 3 Encounters:  03/26/22 130/64  02/25/22 (!) 140/82  02/14/22 120/70    CC:  Chief Complaint  Patient presents with   Follow-up    CT results   Diabetes    Patient states Endocrinologist just told her she was Type 1 DM instead of Type 2   Rash    Has lost over 90 lbs over the last year and now gets rashes under breast and abdominal skin folds    Subjective:   HPI: Tanya Graves is a 56 y.o. female presenting on 03/26/2022 for Follow-up (CT results)  Previous patient of Dr Einar Pheasant   Here for transfer of care visit.  Last OV with Dr. Einar Pheasant reviewed from 08/27/2021 for annual   ENDO Dr., Kelton Pillar.. recent Dx change to DM type 1. Lab Results  Component Value Date   HGBA1C 12.7 (H) 02/14/2022   .High cholesterol  At goal last check on atorvastatin 20 mg daily Lab Results  Component Value Date   CHOL 176 04/16/2021   HDL 66.70 04/16/2021   LDLCALC 80 04/16/2021   LDLDIRECT 88.0 03/25/2019   TRIG 146.0 04/16/2021   CHOLHDL 3 04/16/2021   Hypothyroid  Lab Results  Component Value Date   TSH 1.09 02/25/2022       GYN treating CIN1 s/p LEEP  Reviewed recent CT abdomen and pelvis from January 06, 2022 with patient. IMPRESSION: 1. No acute abnormality in the abdomen or pelvis. 2. 3.1 cm left adrenal nodule may represent an adrenal adenoma however is indeterminate. Further evaluation with nonemergent outpatient adrenal protocol MRI or CT is recommended.    She has redness , odor and irritation in skin  Relevant past medical, surgical, family and social history reviewed and updated as indicated. Interim medical history since our last visit reviewed. Allergies and  medications reviewed and updated. Outpatient Medications Prior to Visit  Medication Sig Dispense Refill   atorvastatin (LIPITOR) 20 MG tablet TAKE ONE TABLET BY MOUTH DAILY 90 tablet 3   BD INSULIN SYRINGE U/F 1/2UNIT 31G X 5/16" 0.3 ML MISC USE WITH NOVOLOG THREE TIMES A DAY 100 each 1   estradiol (ESTRACE) 0.1 MG/GM vaginal cream Place 1 Applicatorful vaginally at bedtime.     glucose blood (CONTOUR NEXT TEST) test strip 1 each by Other route as directed. 6x daily due to hypoglycemia 600 each 3   insulin glargine, 1 Unit Dial, (TOUJEO SOLOSTAR) 300 UNIT/ML Solostar Pen Inject 24 Units into the skin daily.     Insulin Pen Needle (BD PEN NEEDLE MICRO U/F) 32G X 6 MM MISC Inject 1 Device into the skin in the morning, at noon, in the evening, and at bedtime. 4x a day 400 each 3   levothyroxine (SYNTHROID) 175 MCG tablet Take 1 tablet (175 mcg total) by mouth daily with breakfast. 90 tablet 3   meloxicam (MOBIC) 15 MG tablet TAKE ONE TABLET BY MOUTH DAILY 30 tablet 2   NOVOLOG 100 UNIT/ML injection INJECT A MAXIMUM OF OF 60 UNITS UNDER THE SKIN DAILY 10 mL 2   tirzepatide (MOUNJARO) 7.5 MG/0.5ML  Pen Inject 7.5 mg into the skin once a week. 6 mL 3   No facility-administered medications prior to visit.     Per HPI unless specifically indicated in ROS section below Review of Systems  Constitutional:  Negative for fatigue, fever and unexpected weight change.  HENT:  Negative for congestion, ear pain, sinus pressure, sneezing, sore throat and trouble swallowing.   Eyes:  Negative for pain and itching.  Respiratory:  Negative for cough, shortness of breath and wheezing.   Cardiovascular:  Negative for chest pain, palpitations and leg swelling.  Gastrointestinal:  Negative for abdominal pain, blood in stool, constipation, diarrhea and nausea.  Genitourinary:  Negative for difficulty urinating, dysuria, hematuria, menstrual problem, vaginal bleeding and vaginal discharge.  Musculoskeletal:  Negative  for back pain.  Skin:  Negative for rash.  Neurological:  Negative for syncope, weakness, light-headedness, numbness and headaches.  Psychiatric/Behavioral:  Negative for confusion and dysphoric mood. The patient is not nervous/anxious.    Objective:  Pulse 97   Temp 98.5 F (36.9 C) (Oral)   Ht '5\' 8"'$  (1.727 m)   Wt 229 lb 2 oz (103.9 kg)   LMP 06/28/2015 (Approximate)   SpO2 96%   BMI 34.84 kg/m   Wt Readings from Last 3 Encounters:  03/26/22 229 lb 2 oz (103.9 kg)  02/25/22 230 lb (104.3 kg)  02/14/22 223 lb 3.2 oz (101.2 kg)      Physical Exam Constitutional:      General: She is not in acute distress.    Appearance: Normal appearance. She is well-developed. She is not ill-appearing or toxic-appearing.  HENT:     Head: Normocephalic.     Right Ear: Hearing, tympanic membrane, ear canal and external ear normal. Tympanic membrane is not erythematous, retracted or bulging.     Left Ear: Hearing, tympanic membrane, ear canal and external ear normal. Tympanic membrane is not erythematous, retracted or bulging.     Nose: No mucosal edema or rhinorrhea.     Right Sinus: No maxillary sinus tenderness or frontal sinus tenderness.     Left Sinus: No maxillary sinus tenderness or frontal sinus tenderness.     Mouth/Throat:     Pharynx: Uvula midline.  Eyes:     General: Lids are normal. Lids are everted, no foreign bodies appreciated.     Conjunctiva/sclera: Conjunctivae normal.     Pupils: Pupils are equal, round, and reactive to light.  Neck:     Thyroid: No thyroid mass or thyromegaly.     Vascular: No carotid bruit.     Trachea: Trachea normal.  Cardiovascular:     Rate and Rhythm: Normal rate and regular rhythm.     Pulses: Normal pulses.     Heart sounds: Normal heart sounds, S1 normal and S2 normal. No murmur heard.    No friction rub. No gallop.  Pulmonary:     Effort: Pulmonary effort is normal. No tachypnea or respiratory distress.     Breath sounds: Normal breath  sounds. No decreased breath sounds, wheezing, rhonchi or rales.  Abdominal:     General: Bowel sounds are normal.     Palpations: Abdomen is soft.     Tenderness: There is no abdominal tenderness.  Musculoskeletal:     Cervical back: Normal range of motion and neck supple.  Skin:    General: Skin is warm and dry.     Findings: No rash.  Neurological:     Mental Status: She is alert.  Psychiatric:  Mood and Affect: Mood is not anxious or depressed.        Speech: Speech normal.        Behavior: Behavior normal. Behavior is cooperative.        Thought Content: Thought content normal.        Judgment: Judgment normal.       Results for orders placed or performed in visit on 02/25/22  Islet Cell Ab Screen rflx to Titer  Result Value Ref Range   ISLET CELL ANTIBODY SCREEN POSITIVE (A) NEGATIVE  Glutamic acid decarboxylase auto abs  Result Value Ref Range   Glutamic Acid Decarb Ab >250 (H) <5 IU/mL  TSH  Result Value Ref Range   TSH 1.09 0.35 - 5.50 uIU/mL  Islet cell AB/R Titer reflex  Result Value Ref Range   Islet Cell Ab Titer 80 (H) JDF units     COVID 19 screen:  No recent travel or known exposure to COVID19 The patient denies respiratory symptoms of COVID 19 at this time. The importance of social distancing was discussed today.   Assessment and Plan    Problem List Items Addressed This Visit     Candidal intertrigo     Can use topical nystatin  in skin folds for candidal yeast infeciton.      Relevant Medications   nystatin cream (MYCOSTATIN)   Constipation      Increase water and fiber in diet.  Start miralax 17 grams OTC daily.      RESOLVED: Hyperlipidemia associated with type 2 diabetes mellitus ([place])     At goal last check on atorvastatin 20 mg daily      Hypothyroidism    Stable, chronic.  Continue current medication.   Levo 175 mcg daily      Mass of right adrenal gland ([place]) - Primary     We will work on setting up referral for MRI  or CT of adrenal gland.       Relevant Orders   MR Abdomen W Wo Contrast   OSA (obstructive sleep apnea)    Using CPAP      Type 1 diabetes mellitus with complications ([facility])    Poor control. Followed by ENDO, Dr. Kelton Pillar  She is on Tuojeo 24 units daily ON SSI Novolog  On Mounjaro 7.5 mg weekly            Eliezer Lofts, MD

## 2022-03-26 NOTE — Assessment & Plan Note (Signed)
At goal last check on atorvastatin 20 mg daily

## 2022-03-26 NOTE — Assessment & Plan Note (Signed)
Poor control. Followed by ENDO, Dr. Kelton Pillar  She is on Tuojeo 24 units daily ON SSI Novolog  On Mounjaro 7.5 mg weekly

## 2022-03-26 NOTE — Patient Instructions (Addendum)
We will work on setting up referral for MRI or CT of adrenal gland.  Can use topical nystatin  in skin folds for candidal yeast infeciton.  Increase water and fiber in diet.  Start miralax 17 grams OTC daily.

## 2022-03-26 NOTE — Assessment & Plan Note (Signed)
Using CPAP 

## 2022-04-05 ENCOUNTER — Ambulatory Visit (INDEPENDENT_AMBULATORY_CARE_PROVIDER_SITE_OTHER): Payer: BC Managed Care – PPO | Admitting: Nurse Practitioner

## 2022-04-05 ENCOUNTER — Encounter: Payer: Self-pay | Admitting: Nurse Practitioner

## 2022-04-05 VITALS — BP 144/90 | HR 100 | Temp 96.2°F | Resp 18 | Ht 68.0 in | Wt 224.5 lb

## 2022-04-05 DIAGNOSIS — R062 Wheezing: Secondary | ICD-10-CM | POA: Insufficient documentation

## 2022-04-05 DIAGNOSIS — J0100 Acute maxillary sinusitis, unspecified: Secondary | ICD-10-CM | POA: Diagnosis not present

## 2022-04-05 DIAGNOSIS — R051 Acute cough: Secondary | ICD-10-CM | POA: Diagnosis not present

## 2022-04-05 MED ORDER — AMOXICILLIN-POT CLAVULANATE 875-125 MG PO TABS: 1.0000 | ORAL_TABLET | Freq: Two times a day (BID) | ORAL | 0 refills | Status: AC

## 2022-04-05 MED ORDER — ALBUTEROL SULFATE HFA 108 (90 BASE) MCG/ACT IN AERS: 2.0000 | INHALATION_SPRAY | Freq: Four times a day (QID) | RESPIRATORY_TRACT | 0 refills | Status: DC | PRN

## 2022-04-05 MED ORDER — HYDROCODONE BIT-HOMATROP MBR 5-1.5 MG/5ML PO SOLN: 5.0000 mL | Freq: Every day | ORAL | 0 refills | Status: AC

## 2022-04-05 NOTE — Assessment & Plan Note (Signed)
Noted on exam.  Albuterol inhaler 2 puffs every 4-6 hours as needed shortness of breath/wheezing.

## 2022-04-05 NOTE — Patient Instructions (Signed)
Nice to see you today I have sent in an antibiotic, inhaler, and cough medication. The cough medication can make you sleepy Follow up if you do not improve or you get worse

## 2022-04-05 NOTE — Progress Notes (Signed)
Acute Office Visit  Subjective:     Patient ID: Tanya Graves, female    DOB: [birth year], 56 y.o.   MRN: [id number]  Chief Complaint  Patient presents with   Cough    Sx started about 2 weeks ago-cough-gets in coughing spells, thick clear phlegm comes up, chest tightness, congestion, post nasal drip, sinus headache, sinus pain and pressure above and below eyes. Has tested for Covid x 2, last time was 2 days ago and it was negative both times. Has been taking Mucinnex.     Patient is in today for cough  Symptoms started approx 2 weeks ago. Stats that he had the sneezing and has been allergy tested when she moved here. States she is allergic to 2 type of leaves. States that allergy medicines usually helps.   Mucinex and mucinex and night time. Has helped some States that she has covid tested twice and both were negative  Covid shots with booster Review of Systems  Constitutional:  Positive for malaise/fatigue. Negative for fever.       Decreaed appetite Lower fluid intake  HENT:  Positive for ear pain (full) and sinus pain. Negative for sore throat.   Respiratory:  Positive for cough and shortness of breath.        Chest tightness   Musculoskeletal:  Negative for joint pain and myalgias.  Neurological:  Positive for headaches.        Objective:    BP (!) 144/90   Pulse 100   Temp (!) 96.2 F (35.7 C) (Temporal)   Resp 18   Ht '5\' 8"'$  (1.727 m)   Wt 224 lb 8 oz (101.8 kg)   LMP 06/28/2015 (Approximate)   SpO2 97%   BMI 34.14 kg/m    Physical Exam Vitals and nursing note reviewed.  Constitutional:      Appearance: Normal appearance.  HENT:     Right Ear: Tympanic membrane, ear canal and external ear normal.     Left Ear: Tympanic membrane, ear canal and external ear normal.     Nose:     Right Sinus: No maxillary sinus tenderness or frontal sinus tenderness.     Left Sinus: Maxillary sinus tenderness and frontal sinus tenderness present.     Mouth/Throat:      Mouth: Mucous membranes are moist.     Pharynx: Oropharynx is clear.  Cardiovascular:     Rate and Rhythm: Normal rate and regular rhythm.     Heart sounds: Normal heart sounds.  Pulmonary:     Effort: Pulmonary effort is normal.     Breath sounds: Wheezing present.  Lymphadenopathy:     Cervical: No cervical adenopathy.  Neurological:     Mental Status: She is alert.     No results found for any visits on 04/05/22.      Assessment & Plan:   Problem List Items Addressed This Visit       Respiratory   Acute non-recurrent maxillary sinusitis - Primary    Given length of illness and physical exam and presentation will elect to treat with Augmentin 875-125 mg twice daily for 7 days.  Patient will follow-up if no improvement.  She can continue using over-the-counter daytime Mucinex if relief is provided.      Relevant Medications   amoxicillin-clavulanate (AUGMENTIN) 875-125 MG tablet   HYDROcodone bit-homatropine (HYCODAN) 5-1.5 MG/5ML syrup     Other   Acute cough    Patient's been using Mucinex daytime and nighttime.  We  will discontinue Mucinex nighttime.  We will send in hydrocodone cough syrup nightly as needed.  Sedation precautions reviewed.  Can continue the guaifenesin throughout the day if beneficial      Relevant Medications   HYDROcodone bit-homatropine (HYCODAN) 5-1.5 MG/5ML syrup   Wheezing    Noted on exam.  Albuterol inhaler 2 puffs every 4-6 hours as needed shortness of breath/wheezing.      Relevant Medications   albuterol (VENTOLIN HFA) 108 (90 Base) MCG/ACT inhaler    Meds ordered this encounter  Medications   amoxicillin-clavulanate (AUGMENTIN) 875-125 MG tablet    Sig: Take 1 tablet by mouth 2 (two) times daily for 7 days.    Dispense:  14 tablet    Refill:  0    Order Specific Question:   Supervising Provider    Answer:   Glori Bickers, MARNE A [[id number]]   HYDROcodone bit-homatropine (HYCODAN) 5-1.5 MG/5ML syrup    Sig: Take 5 mLs by mouth at bedtime  for 10 days.    Dispense:  50 mL    Refill:  0    Order Specific Question:   Supervising Provider    Answer:   Loura Pardon A [[id number]]   albuterol (VENTOLIN HFA) 108 (90 Base) MCG/ACT inhaler    Sig: Inhale 2 puffs into the lungs every 6 (six) hours as needed for wheezing or shortness of breath.    Dispense:  8 g    Refill:  0    Order Specific Question:   Supervising Provider    Answer:   TOWER, MARNE A [[id number]]    Return if symptoms worsen or fail to improve.  Romilda Garret, NP

## 2022-04-05 NOTE — Assessment & Plan Note (Signed)
Patient's been using Mucinex daytime and nighttime.  We will discontinue Mucinex nighttime.  We will send in hydrocodone cough syrup nightly as needed.  Sedation precautions reviewed.  Can continue the guaifenesin throughout the day if beneficial

## 2022-04-05 NOTE — Assessment & Plan Note (Signed)
Given length of illness and physical exam and presentation will elect to treat with Augmentin 875-125 mg twice daily for 7 days.  Patient will follow-up if no improvement.  She can continue using over-the-counter daytime Mucinex if relief is provided.

## 2022-04-15 ENCOUNTER — Other Ambulatory Visit: Payer: Self-pay | Admitting: Podiatry

## 2022-04-15 ENCOUNTER — Other Ambulatory Visit: Payer: Self-pay | Admitting: Internal Medicine

## 2022-04-24 ENCOUNTER — Other Ambulatory Visit: Payer: BC Managed Care – PPO

## 2022-04-29 DIAGNOSIS — K5900 Constipation, unspecified: Secondary | ICD-10-CM | POA: Insufficient documentation

## 2022-04-29 NOTE — Assessment & Plan Note (Signed)
Can use topical nystatin  in skin folds for candidal yeast infeciton.

## 2022-04-29 NOTE — Assessment & Plan Note (Signed)
We will work on setting up referral for MRI or CT of adrenal gland.

## 2022-04-29 NOTE — Assessment & Plan Note (Addendum)
   Increase water and fiber in diet.  Start miralax 17 grams OTC daily.

## 2022-05-10 ENCOUNTER — Inpatient Hospital Stay: Payer: BC Managed Care – PPO

## 2022-05-10 ENCOUNTER — Emergency Department: Payer: BC Managed Care – PPO

## 2022-05-10 ENCOUNTER — Inpatient Hospital Stay
Admission: EM | Admit: 2022-05-10 | Discharge: 2022-05-15 | DRG: 637 | Disposition: A | Discharge status: Home Health | Payer: BC Managed Care – PPO | Attending: Internal Medicine | Admitting: Internal Medicine

## 2022-05-10 DIAGNOSIS — E876 Hypokalemia: Secondary | ICD-10-CM | POA: Diagnosis not present

## 2022-05-10 DIAGNOSIS — E279 Disorder of adrenal gland, unspecified: Secondary | ICD-10-CM | POA: Diagnosis present

## 2022-05-10 DIAGNOSIS — E039 Hypothyroidism, unspecified: Secondary | ICD-10-CM | POA: Diagnosis present

## 2022-05-10 DIAGNOSIS — E079 Disorder of thyroid, unspecified: Secondary | ICD-10-CM | POA: Diagnosis present

## 2022-05-10 DIAGNOSIS — A419 Sepsis, unspecified organism: Secondary | ICD-10-CM | POA: Diagnosis not present

## 2022-05-10 DIAGNOSIS — S42202A Unspecified fracture of upper end of left humerus, initial encounter for closed fracture: Secondary | ICD-10-CM

## 2022-05-10 DIAGNOSIS — M1990 Unspecified osteoarthritis, unspecified site: Secondary | ICD-10-CM | POA: Diagnosis present

## 2022-05-10 DIAGNOSIS — E1011 Type 1 diabetes mellitus with ketoacidosis with coma: Secondary | ICD-10-CM | POA: Diagnosis present

## 2022-05-10 DIAGNOSIS — N179 Acute kidney failure, unspecified: Secondary | ICD-10-CM | POA: Diagnosis present

## 2022-05-10 DIAGNOSIS — G9341 Metabolic encephalopathy: Secondary | ICD-10-CM | POA: Diagnosis present

## 2022-05-10 DIAGNOSIS — Z833 Family history of diabetes mellitus: Secondary | ICD-10-CM

## 2022-05-10 DIAGNOSIS — G4733 Obstructive sleep apnea (adult) (pediatric): Secondary | ICD-10-CM | POA: Diagnosis present

## 2022-05-10 DIAGNOSIS — Z1152 Encounter for screening for COVID-19: Secondary | ICD-10-CM

## 2022-05-10 DIAGNOSIS — E1110 Type 2 diabetes mellitus with ketoacidosis without coma: Secondary | ICD-10-CM | POA: Diagnosis present

## 2022-05-10 DIAGNOSIS — F17210 Nicotine dependence, cigarettes, uncomplicated: Secondary | ICD-10-CM | POA: Diagnosis present

## 2022-05-10 DIAGNOSIS — E870 Hyperosmolality and hypernatremia: Secondary | ICD-10-CM | POA: Diagnosis not present

## 2022-05-10 DIAGNOSIS — K5900 Constipation, unspecified: Secondary | ICD-10-CM | POA: Diagnosis present

## 2022-05-10 DIAGNOSIS — Z794 Long term (current) use of insulin: Secondary | ICD-10-CM | POA: Diagnosis not present

## 2022-05-10 DIAGNOSIS — T68XXXA Hypothermia, initial encounter: Secondary | ICD-10-CM

## 2022-05-10 DIAGNOSIS — R680 Hypothermia, not associated with low environmental temperature: Secondary | ICD-10-CM | POA: Diagnosis present

## 2022-05-10 DIAGNOSIS — L304 Erythema intertrigo: Secondary | ICD-10-CM | POA: Diagnosis present

## 2022-05-10 DIAGNOSIS — E669 Obesity, unspecified: Secondary | ICD-10-CM | POA: Diagnosis present

## 2022-05-10 DIAGNOSIS — S42295A Other nondisplaced fracture of upper end of left humerus, initial encounter for closed fracture: Secondary | ICD-10-CM | POA: Diagnosis not present

## 2022-05-10 DIAGNOSIS — E875 Hyperkalemia: Secondary | ICD-10-CM | POA: Diagnosis present

## 2022-05-10 DIAGNOSIS — R571 Hypovolemic shock: Secondary | ICD-10-CM | POA: Diagnosis present

## 2022-05-10 DIAGNOSIS — K219 Gastro-esophageal reflux disease without esophagitis: Secondary | ICD-10-CM | POA: Diagnosis present

## 2022-05-10 DIAGNOSIS — R451 Restlessness and agitation: Secondary | ICD-10-CM | POA: Diagnosis not present

## 2022-05-10 DIAGNOSIS — R630 Anorexia: Secondary | ICD-10-CM | POA: Diagnosis present

## 2022-05-10 DIAGNOSIS — E1111 Type 2 diabetes mellitus with ketoacidosis with coma: Secondary | ICD-10-CM | POA: Diagnosis not present

## 2022-05-10 DIAGNOSIS — W19XXXA Unspecified fall, initial encounter: Secondary | ICD-10-CM | POA: Diagnosis present

## 2022-05-10 DIAGNOSIS — Z79899 Other long term (current) drug therapy: Secondary | ICD-10-CM

## 2022-05-10 DIAGNOSIS — J9690 Respiratory failure, unspecified, unspecified whether with hypoxia or hypercapnia: Secondary | ICD-10-CM | POA: Diagnosis present

## 2022-05-10 DIAGNOSIS — E872 Acidosis, unspecified: Secondary | ICD-10-CM | POA: Diagnosis not present

## 2022-05-10 DIAGNOSIS — E871 Hypo-osmolality and hyponatremia: Secondary | ICD-10-CM | POA: Diagnosis present

## 2022-05-10 DIAGNOSIS — Z79890 Hormone replacement therapy (postmenopausal): Secondary | ICD-10-CM

## 2022-05-10 DIAGNOSIS — Z8261 Family history of arthritis: Secondary | ICD-10-CM

## 2022-05-10 DIAGNOSIS — S42292S Other displaced fracture of upper end of left humerus, sequela: Secondary | ICD-10-CM | POA: Diagnosis not present

## 2022-05-10 DIAGNOSIS — Z6838 Body mass index (BMI) 38.0-38.9, adult: Secondary | ICD-10-CM

## 2022-05-10 DIAGNOSIS — E1169 Type 2 diabetes mellitus with other specified complication: Secondary | ICD-10-CM

## 2022-05-10 DIAGNOSIS — Z7985 Long-term (current) use of injectable non-insulin antidiabetic drugs: Secondary | ICD-10-CM

## 2022-05-10 LAB — BLOOD GAS, ARTERIAL
Acid-base deficit: 3.2 mmol/L — ABNORMAL HIGH (ref 0.0–2.0)
Bicarbonate: 19.2 mmol/L — ABNORMAL LOW (ref 20.0–28.0)
FIO2: 40 %
MECHVT: 480 mL
O2 Saturation: 99.8 %
PEEP: 5 cmH2O
Patient temperature: 37
RATE: 34 resp/min
pCO2 arterial: 27 mmHg — ABNORMAL LOW (ref 32–48)
pH, Arterial: 7.46 — ABNORMAL HIGH (ref 7.35–7.45)
pO2, Arterial: 172 mmHg — ABNORMAL HIGH (ref 83–108)

## 2022-05-10 LAB — URINE DRUG SCREEN, QUALITATIVE (ARMC ONLY)
Amphetamines, Ur Screen: NOT DETECTED
Barbiturates, Ur Screen: NOT DETECTED
Benzodiazepine, Ur Scrn: NOT DETECTED
Cannabinoid 50 Ng, Ur ~~LOC~~: NOT DETECTED
Cocaine Metabolite,Ur ~~LOC~~: NOT DETECTED
MDMA (Ecstasy)Ur Screen: NOT DETECTED
Methadone Scn, Ur: NOT DETECTED
Opiate, Ur Screen: NOT DETECTED
Phencyclidine (PCP) Ur S: NOT DETECTED
Tricyclic, Ur Screen: NOT DETECTED

## 2022-05-10 LAB — URINALYSIS, ROUTINE W REFLEX MICROSCOPIC
Bacteria, UA: NONE SEEN
Bilirubin Urine: NEGATIVE
Glucose, UA: >=500 mg/dL — AB
Hgb urine dipstick: NEGATIVE
Ketones, ur: 80 mg/dL — AB
Leukocytes,Ua: NEGATIVE
Nitrite: NEGATIVE
Protein, ur: 30 mg/dL — AB
Specific Gravity, Urine: 1.021 (ref 1.005–1.030)
pH: 5 (ref 5.0–8.0)

## 2022-05-10 LAB — COMPREHENSIVE METABOLIC PANEL
ALT: 15 U/L (ref 0–44)
ALT: 20 U/L (ref 0–44)
ALT: 21 U/L (ref 0–44)
AST: 32 U/L (ref 15–41)
AST: 33 U/L (ref 15–41)
AST: 38 U/L (ref 15–41)
Albumin: 2.9 g/dL — ABNORMAL LOW (ref 3.5–5.0)
Albumin: 2.8 g/dL — ABNORMAL LOW (ref 3.5–5.0)
Albumin: 3 g/dL — ABNORMAL LOW (ref 3.5–5.0)
Alkaline Phosphatase: 92 U/L (ref 38–126)
Alkaline Phosphatase: 89 U/L (ref 38–126)
Alkaline Phosphatase: 100 U/L (ref 38–126)
BUN: 27 mg/dL — ABNORMAL HIGH (ref 6–20)
BUN: 29 mg/dL — ABNORMAL HIGH (ref 6–20)
BUN: 31 mg/dL — ABNORMAL HIGH (ref 6–20)
CO2: <7 mmol/L — ABNORMAL LOW (ref 22–32)
CO2: <7 mmol/L — ABNORMAL LOW (ref 22–32)
CO2: <7 mmol/L — ABNORMAL LOW (ref 22–32)
Calcium: 7.9 mg/dL — ABNORMAL LOW (ref 8.9–10.3)
Calcium: 7.9 mg/dL — ABNORMAL LOW (ref 8.9–10.3)
Calcium: 7.9 mg/dL — ABNORMAL LOW (ref 8.9–10.3)
Chloride: 102 mmol/L (ref 98–111)
Chloride: 98 mmol/L (ref 98–111)
Chloride: 98 mmol/L (ref 98–111)
Creatinine, Ser: 2.12 mg/dL — ABNORMAL HIGH (ref 0.44–1.00)
Creatinine, Ser: 2.28 mg/dL — ABNORMAL HIGH (ref 0.44–1.00)
Creatinine, Ser: 2.39 mg/dL — ABNORMAL HIGH (ref 0.44–1.00)
GFR, Estimated: 27 mL/min — ABNORMAL LOW (ref >60)
GFR, Estimated: 25 mL/min — ABNORMAL LOW (ref >60)
GFR, Estimated: 23 mL/min — ABNORMAL LOW (ref >60)
Glucose, Bld: 1070 mg/dL (ref 70–99)
Glucose, Bld: 1135 mg/dL (ref 70–99)
Glucose, Bld: 1054 mg/dL (ref 70–99)
Potassium: 7.4 mmol/L (ref 3.5–5.1)
Potassium: >7.5 mmol/L (ref 3.5–5.1)
Potassium: 7.3 mmol/L (ref 3.5–5.1)
Sodium: 135 mmol/L (ref 135–145)
Sodium: 135 mmol/L (ref 135–145)
Sodium: 135 mmol/L (ref 135–145)
Total Bilirubin: 2.1 mg/dL — ABNORMAL HIGH (ref 0.3–1.2)
Total Bilirubin: 2.9 mg/dL — ABNORMAL HIGH (ref 0.3–1.2)
Total Bilirubin: 2.8 mg/dL — ABNORMAL HIGH (ref 0.3–1.2)
Total Protein: 5.5 g/dL — ABNORMAL LOW (ref 6.5–8.1)
Total Protein: 5.5 g/dL — ABNORMAL LOW (ref 6.5–8.1)
Total Protein: 5.8 g/dL — ABNORMAL LOW (ref 6.5–8.1)

## 2022-05-10 LAB — GLUCOSE, CAPILLARY
Glucose-Capillary: 251 mg/dL — ABNORMAL HIGH (ref 70–99)
Glucose-Capillary: 306 mg/dL — ABNORMAL HIGH (ref 70–99)
Glucose-Capillary: 349 mg/dL — ABNORMAL HIGH (ref 70–99)
Glucose-Capillary: 350 mg/dL — ABNORMAL HIGH (ref 70–99)
Glucose-Capillary: 398 mg/dL — ABNORMAL HIGH (ref 70–99)
Glucose-Capillary: 429 mg/dL — ABNORMAL HIGH (ref 70–99)
Glucose-Capillary: 583 mg/dL (ref 70–99)
Glucose-Capillary: 594 mg/dL (ref 70–99)

## 2022-05-10 LAB — BASIC METABOLIC PANEL
Anion gap: 22 — ABNORMAL HIGH (ref 5–15)
BUN: 27 mg/dL — ABNORMAL HIGH (ref 6–20)
CO2: 17 mmol/L — ABNORMAL LOW (ref 22–32)
Calcium: 7.7 mg/dL — ABNORMAL LOW (ref 8.9–10.3)
Chloride: 107 mmol/L (ref 98–111)
Creatinine, Ser: 1.81 mg/dL — ABNORMAL HIGH (ref 0.44–1.00)
GFR, Estimated: 33 mL/min — ABNORMAL LOW (ref >60)
Glucose, Bld: 443 mg/dL — ABNORMAL HIGH (ref 70–99)
Potassium: 3.9 mmol/L (ref 3.5–5.1)
Sodium: 146 mmol/L — ABNORMAL HIGH (ref 135–145)

## 2022-05-10 LAB — T4, FREE: Free T4: 0.82 ng/dL (ref 0.61–1.12)

## 2022-05-10 LAB — RESP PANEL BY RT-PCR (RSV, FLU A&B, COVID)  RVPGX2
Influenza A by PCR: NEGATIVE
Influenza B by PCR: NEGATIVE
Resp Syncytial Virus by PCR: NEGATIVE
SARS Coronavirus 2 by RT PCR: NEGATIVE

## 2022-05-10 LAB — CBC WITH DIFFERENTIAL/PLATELET
Abs Immature Granulocytes: 1.47 10*3/uL — ABNORMAL HIGH (ref 0.00–0.07)
Abs Immature Granulocytes: 1.48 10*3/uL — ABNORMAL HIGH (ref 0.00–0.07)
Basophils Absolute: 0.1 10*3/uL (ref 0.0–0.1)
Basophils Absolute: 0.1 10*3/uL (ref 0.0–0.1)
Basophils Relative: 0 %
Basophils Relative: 0 %
Eosinophils Absolute: 0.1 10*3/uL (ref 0.0–0.5)
Eosinophils Absolute: 0 10*3/uL (ref 0.0–0.5)
Eosinophils Relative: 0 %
Eosinophils Relative: 0 %
HCT: 43.8 % (ref 36.0–46.0)
HCT: 38.4 % (ref 36.0–46.0)
Hemoglobin: 12.4 g/dL (ref 12.0–15.0)
Hemoglobin: 11.3 g/dL — ABNORMAL LOW (ref 12.0–15.0)
Immature Granulocytes: 5 %
Immature Granulocytes: 4 %
Lymphocytes Relative: 12 %
Lymphocytes Relative: 8 %
Lymphs Abs: 3.9 10*3/uL (ref 0.7–4.0)
Lymphs Abs: 3.1 10*3/uL (ref 0.7–4.0)
MCH: 32.7 pg (ref 26.0–34.0)
MCH: 32.9 pg (ref 26.0–34.0)
MCHC: 28.3 g/dL — ABNORMAL LOW (ref 30.0–36.0)
MCHC: 29.4 g/dL — ABNORMAL LOW (ref 30.0–36.0)
MCV: 115.6 fL — ABNORMAL HIGH (ref 80.0–100.0)
MCV: 112 fL — ABNORMAL HIGH (ref 80.0–100.0)
Monocytes Absolute: 1.7 10*3/uL — ABNORMAL HIGH (ref 0.1–1.0)
Monocytes Absolute: 1.8 10*3/uL — ABNORMAL HIGH (ref 0.1–1.0)
Monocytes Relative: 5 %
Monocytes Relative: 5 %
Neutro Abs: 25.4 10*3/uL — ABNORMAL HIGH (ref 1.7–7.7)
Neutro Abs: 34.2 10*3/uL — ABNORMAL HIGH (ref 1.7–7.7)
Neutrophils Relative %: 78 %
Neutrophils Relative %: 83 %
Platelets: 361 10*3/uL (ref 150–400)
Platelets: 304 10*3/uL (ref 150–400)
RBC: 3.79 MIL/uL — ABNORMAL LOW (ref 3.87–5.11)
RBC: 3.43 MIL/uL — ABNORMAL LOW (ref 3.87–5.11)
RDW: 12.3 % (ref 11.5–15.5)
RDW: 12.3 % (ref 11.5–15.5)
Smear Review: NORMAL
Smear Review: NORMAL
WBC: 32.7 10*3/uL — ABNORMAL HIGH (ref 4.0–10.5)
WBC: 40.7 10*3/uL — ABNORMAL HIGH (ref 4.0–10.5)
nRBC: 0 % (ref 0.0–0.2)
nRBC: 0 % (ref 0.0–0.2)

## 2022-05-10 LAB — LACTIC ACID, PLASMA
Lactic Acid, Venous: 6.6 mmol/L (ref 0.5–1.9)
Lactic Acid, Venous: 7.1 mmol/L (ref 0.5–1.9)
Lactic Acid, Venous: 4.5 mmol/L (ref 0.5–1.9)
Lactic Acid, Venous: 4.7 mmol/L (ref 0.5–1.9)

## 2022-05-10 LAB — BLOOD GAS, VENOUS
Acid-base deficit: 27 mmol/L — ABNORMAL HIGH (ref 0.0–2.0)
Bicarbonate: 3.4 mmol/L — ABNORMAL LOW (ref 20.0–28.0)
O2 Saturation: 94.9 %
Patient temperature: 37
pCO2, Ven: <18 mmHg — CL (ref 44–60)
pH, Ven: 6.96 — CL (ref 7.25–7.43)
pO2, Ven: 72 mmHg — ABNORMAL HIGH (ref 32–45)

## 2022-05-10 LAB — CBG MONITORING, ED
Glucose-Capillary: >600 mg/dL (ref 70–99)
Glucose-Capillary: >600 mg/dL (ref 70–99)
Glucose-Capillary: >600 mg/dL (ref 70–99)
Glucose-Capillary: >600 mg/dL (ref 70–99)
Glucose-Capillary: >600 mg/dL (ref 70–99)

## 2022-05-10 LAB — PHOSPHORUS
Phosphorus: 10 mg/dL — ABNORMAL HIGH (ref 2.5–4.6)
Phosphorus: 9.7 mg/dL — ABNORMAL HIGH (ref 2.5–4.6)

## 2022-05-10 LAB — LIPASE, BLOOD
Lipase: 23 U/L (ref 11–51)
Lipase: 85 U/L — ABNORMAL HIGH (ref 11–51)

## 2022-05-10 LAB — PROCALCITONIN: Procalcitonin: 7.22 ng/mL

## 2022-05-10 LAB — MAGNESIUM
Magnesium: 2.6 mg/dL — ABNORMAL HIGH (ref 1.7–2.4)
Magnesium: 2.4 mg/dL (ref 1.7–2.4)

## 2022-05-10 LAB — AMYLASE: Amylase: 50 U/L (ref 28–100)

## 2022-05-10 LAB — TROPONIN I (HIGH SENSITIVITY): Troponin I (High Sensitivity): 18 ng/L — ABNORMAL HIGH (ref <18)

## 2022-05-10 LAB — MRSA NEXT GEN BY PCR, NASAL: MRSA by PCR Next Gen: NOT DETECTED

## 2022-05-10 LAB — PREGNANCY, URINE: Preg Test, Ur: NEGATIVE

## 2022-05-10 LAB — POC URINE PREG, ED: Preg Test, Ur: NEGATIVE

## 2022-05-10 LAB — TSH: TSH: 1.07 u[IU]/mL (ref 0.350–4.500)

## 2022-05-10 LAB — CORTISOL: Cortisol, Plasma: 95.3 ug/dL

## 2022-05-10 LAB — BETA-HYDROXYBUTYRIC ACID: Beta-Hydroxybutyric Acid: >8 mmol/L — ABNORMAL HIGH (ref 0.05–0.27)

## 2022-05-10 MED ORDER — ORAL CARE MOUTH RINSE
15.0000 mL | OROMUCOSAL | Status: DC
  Administered 2022-05-10 – 2022-05-11 (×8): 15 mL via OROMUCOSAL

## 2022-05-10 MED ORDER — DEXTROSE IN LACTATED RINGERS 5 % IV SOLN: INTRAVENOUS | Status: DC

## 2022-05-10 MED ORDER — ENOXAPARIN SODIUM 40 MG/0.4ML IJ SOSY
40.0000 mg | PREFILLED_SYRINGE | INTRAMUSCULAR | Status: DC
  Administered 2022-05-10 – 2022-05-12 (×3): 40 mg via SUBCUTANEOUS
  Filled 2022-05-10 (×3): qty 0.4

## 2022-05-10 MED ORDER — MIDAZOLAM HCL 2 MG/2ML IJ SOLN
1.0000 mg | INTRAMUSCULAR | Status: DC | PRN
  Administered 2022-05-10: 2 mg via INTRAVENOUS
  Filled 2022-05-10: qty 2

## 2022-05-10 MED ORDER — PROPOFOL 1000 MG/100ML IV EMUL
INTRAVENOUS | Status: AC
  Filled 2022-05-10: qty 100

## 2022-05-10 MED ORDER — NOREPINEPHRINE 4 MG/250ML-% IV SOLN: 0.0000 ug/min | INTRAVENOUS | Status: DC

## 2022-05-10 MED ORDER — POLYETHYLENE GLYCOL 3350 17 G PO PACK
17.0000 g | PACK | Freq: Every day | ORAL | Status: DC
  Filled 2022-05-10: qty 1

## 2022-05-10 MED ORDER — SODIUM CHLORIDE 0.9 % IV SOLN
2.0000 g | Freq: Two times a day (BID) | INTRAVENOUS | Status: DC
  Administered 2022-05-10 – 2022-05-11 (×2): 2 g via INTRAVENOUS
  Filled 2022-05-10: qty 2
  Filled 2022-05-10: qty 12.5

## 2022-05-10 MED ORDER — INSULIN ASPART 100 UNIT/ML IV SOLN
10.0000 [IU] | Freq: Once | INTRAVENOUS | Status: DC
  Filled 2022-05-10: qty 0.1

## 2022-05-10 MED ORDER — STERILE WATER FOR INJECTION IV SOLN
INTRAVENOUS | Status: DC
  Filled 2022-05-10 (×3): qty 1000
  Filled 2022-05-10: qty 150
  Filled 2022-05-10 (×2): qty 1000
  Filled 2022-05-10: qty 150

## 2022-05-10 MED ORDER — NOREPINEPHRINE 16 MG/250ML-% IV SOLN
0.0000 ug/min | INTRAVENOUS | Status: DC
  Administered 2022-05-10: 40 ug/min via INTRAVENOUS
  Filled 2022-05-10 (×3): qty 250

## 2022-05-10 MED ORDER — SODIUM CHLORIDE 0.9 % IV BOLUS
1000.0000 mL | Freq: Once | INTRAVENOUS | Status: AC
  Administered 2022-05-10: 1000 mL via INTRAVENOUS

## 2022-05-10 MED ORDER — CALCIUM GLUCONATE-NACL 1-0.675 GM/50ML-% IV SOLN: 1.0000 g | Freq: Once | INTRAVENOUS | Status: DC

## 2022-05-10 MED ORDER — LACTATED RINGERS IV BOLUS
1000.0000 mL | Freq: Once | INTRAVENOUS | Status: AC
  Administered 2022-05-10: 1000 mL via INTRAVENOUS

## 2022-05-10 MED ORDER — LACTATED RINGERS IV BOLUS
2000.0000 mL | Freq: Once | INTRAVENOUS | Status: AC
  Administered 2022-05-10: 2000 mL via INTRAVENOUS

## 2022-05-10 MED ORDER — DOCUSATE SODIUM 50 MG/5ML PO LIQD
100.0000 mg | Freq: Two times a day (BID) | ORAL | Status: DC
  Filled 2022-05-10: qty 10

## 2022-05-10 MED ORDER — PROPOFOL 1000 MG/100ML IV EMUL
5.0000 ug/kg/min | INTRAVENOUS | Status: DC
  Administered 2022-05-10: 45 ug/kg/min via INTRAVENOUS
  Administered 2022-05-11: 40 ug/kg/min via INTRAVENOUS
  Administered 2022-05-11 (×2): 50 ug/kg/min via INTRAVENOUS
  Filled 2022-05-10 (×4): qty 100

## 2022-05-10 MED ORDER — VASOPRESSIN 20 UNITS/100 ML INFUSION FOR SHOCK
0.0000 [IU]/min | INTRAVENOUS | Status: DC
  Filled 2022-05-10 (×2): qty 100

## 2022-05-10 MED ORDER — CHLORHEXIDINE GLUCONATE CLOTH 2 % EX PADS
6.0000 | MEDICATED_PAD | Freq: Every day | CUTANEOUS | Status: DC
  Administered 2022-05-11 – 2022-05-14 (×3): 6 via TOPICAL

## 2022-05-10 MED ORDER — SODIUM BICARBONATE 8.4 % IV SOLN
50.0000 meq | Freq: Once | INTRAVENOUS | Status: AC
  Administered 2022-05-10: 50 meq via INTRAVENOUS
  Filled 2022-05-10: qty 50

## 2022-05-10 MED ORDER — FENTANYL 2500MCG IN NS 250ML (10MCG/ML) PREMIX INFUSION
50.0000 ug/h | INTRAVENOUS | Status: DC
  Administered 2022-05-10: 50 ug/h via INTRAVENOUS
  Filled 2022-05-10 (×2): qty 250

## 2022-05-10 MED ORDER — PHENYLEPHRINE CONCENTRATED 100MG/250ML (0.4 MG/ML) INFUSION SIMPLE
0.0000 ug/min | INTRAVENOUS | Status: DC
  Administered 2022-05-10: 50 ug/min via INTRAVENOUS
  Filled 2022-05-10: qty 250

## 2022-05-10 MED ORDER — SODIUM CHLORIDE 0.9 % IV SOLN
2.0000 g | Freq: Once | INTRAVENOUS | Status: AC
  Administered 2022-05-10: 2 g via INTRAVENOUS
  Filled 2022-05-10: qty 12.5

## 2022-05-10 MED ORDER — SODIUM BICARBONATE 8.4 % IV SOLN: 100.0000 meq | Freq: Once | INTRAVENOUS | Status: AC

## 2022-05-10 MED ORDER — ONDANSETRON HCL 4 MG/2ML IJ SOLN
4.0000 mg | Freq: Four times a day (QID) | INTRAMUSCULAR | Status: DC | PRN
  Administered 2022-05-12 – 2022-05-15 (×3): 4 mg via INTRAVENOUS
  Filled 2022-05-10 (×3): qty 2

## 2022-05-10 MED ORDER — ROCURONIUM BROMIDE 10 MG/ML (PF) SYRINGE
PREFILLED_SYRINGE | INTRAVENOUS | Status: AC
  Administered 2022-05-10: 100 mg
  Filled 2022-05-10: qty 10

## 2022-05-10 MED ORDER — VANCOMYCIN VARIABLE DOSE PER UNSTABLE RENAL FUNCTION (PHARMACIST DOSING): Status: DC

## 2022-05-10 MED ORDER — FENTANYL BOLUS VIA INFUSION
50.0000 ug | INTRAVENOUS | Status: DC | PRN
  Administered 2022-05-10: 75 ug via INTRAVENOUS
  Administered 2022-05-10 (×2): 50 ug via INTRAVENOUS

## 2022-05-10 MED ORDER — ORAL CARE MOUTH RINSE: 15.0000 mL | OROMUCOSAL | Status: DC | PRN

## 2022-05-10 MED ORDER — PHENYLEPHRINE CONCENTRATED 100MG/250ML (0.4 MG/ML) INFUSION SIMPLE
0.0000 ug/min | INTRAVENOUS | Status: DC
  Filled 2022-05-10: qty 250

## 2022-05-10 MED ORDER — SODIUM BICARBONATE 8.4 % IV SOLN
100.0000 meq | Freq: Once | INTRAVENOUS | Status: AC
  Administered 2022-05-10: 100 meq via INTRAVENOUS
  Filled 2022-05-10: qty 100

## 2022-05-10 MED ORDER — DEXTROSE 50 % IV SOLN: 0.0000 mL | INTRAVENOUS | Status: DC | PRN

## 2022-05-10 MED ORDER — SODIUM BICARBONATE 8.4 % IV SOLN
INTRAVENOUS | Status: AC
  Administered 2022-05-10: 100 meq via INTRAVENOUS
  Filled 2022-05-10: qty 100

## 2022-05-10 MED ORDER — INSULIN REGULAR(HUMAN) IN NACL 100-0.9 UT/100ML-% IV SOLN
INTRAVENOUS | Status: DC
  Administered 2022-05-10: 11.5 [IU]/h via INTRAVENOUS
  Administered 2022-05-10: 20 [IU]/h via INTRAVENOUS
  Filled 2022-05-10 (×3): qty 100

## 2022-05-10 MED ORDER — ETOMIDATE 2 MG/ML IV SOLN
INTRAVENOUS | Status: AC
  Administered 2022-05-10: 20 mg via INTRAVENOUS
  Filled 2022-05-10: qty 10

## 2022-05-10 MED ORDER — PANTOPRAZOLE SODIUM 40 MG IV SOLR
40.0000 mg | Freq: Every day | INTRAVENOUS | Status: DC
  Administered 2022-05-11 – 2022-05-13 (×3): 40 mg via INTRAVENOUS
  Filled 2022-05-10 (×3): qty 10

## 2022-05-10 MED ORDER — PANTOPRAZOLE SODIUM 40 MG PO TBEC: 40.0000 mg | DELAYED_RELEASE_TABLET | Freq: Every day | ORAL | Status: DC

## 2022-05-10 MED ORDER — FENTANYL CITRATE PF 50 MCG/ML IJ SOSY
PREFILLED_SYRINGE | INTRAMUSCULAR | Status: AC
  Administered 2022-05-10: 50 ug via INTRAVENOUS
  Filled 2022-05-10: qty 1

## 2022-05-10 MED ORDER — NOREPINEPHRINE 4 MG/250ML-% IV SOLN
2.0000 ug/min | INTRAVENOUS | Status: DC
  Administered 2022-05-10: 2 ug/min via INTRAVENOUS
  Filled 2022-05-10: qty 250

## 2022-05-10 MED ORDER — METRONIDAZOLE 500 MG/100ML IV SOLN
500.0000 mg | Freq: Once | INTRAVENOUS | Status: AC
  Administered 2022-05-10: 500 mg via INTRAVENOUS
  Filled 2022-05-10: qty 100

## 2022-05-10 MED ORDER — VANCOMYCIN HCL 2000 MG/400ML IV SOLN
2000.0000 mg | Freq: Once | INTRAVENOUS | Status: AC
  Administered 2022-05-10: 2000 mg via INTRAVENOUS
  Filled 2022-05-10: qty 400

## 2022-05-10 MED ORDER — LACTATED RINGERS IV BOLUS: 1000.0000 mL | Freq: Once | INTRAVENOUS | Status: DC

## 2022-05-10 MED ORDER — SODIUM CHLORIDE 0.9 % IV SOLN
250.0000 mL | INTRAVENOUS | Status: DC
  Administered 2022-05-11: 250 mL via INTRAVENOUS

## 2022-05-10 MED ORDER — CALCIUM GLUCONATE 10 % IV SOLN
INTRAVENOUS | Status: AC
  Filled 2022-05-10: qty 10

## 2022-05-10 NOTE — ED Notes (Signed)
Report called to ICU

## 2022-05-10 NOTE — ED Triage Notes (Signed)
Pt arrived to ED via [facility]. Reported unresponsive by daughter. Blood glucose of 554, HR 110, BP 90/27, RR 30, O2 90. Placed on non-rebreather by EMS. Daughter reports recent pt had recent sinus and ear infection, but had not taken antibiotics due to N/V. Normal saline started.

## 2022-05-10 NOTE — Procedures (Signed)
Intubation Procedure Note  Tanya Graves  [id number]  1965-10-11  Date:05/10/22  Time:3:33 PM   Provider Performing:Ardell Makarewicz D Dewaine Conger    Procedure: Intubation (97026)  Indication(s) Respiratory Failure  Consent Risks of the procedure as well as the alternatives and risks of each were explained to the patient and/or caregiver.  Consent for the procedure was obtained and is signed in the bedside chart   Anesthesia Etomidate, Fentanyl, and Rocuronium   Time Out Verified patient identification, verified procedure, site/side was marked, verified correct patient position, special equipment/implants available, medications/allergies/relevant history reviewed, required imaging and test results available.   Sterile Technique Usual hand hygeine, masks, and gloves were used   Procedure Description Patient positioned in bed supine.  Sedation given as noted above.  Patient was intubated with endotracheal tube using Glidescope.  View was Grade 1 full glottis .  Number of attempts was 1.  Colorimetric CO2 detector was consistent with tracheal placement.   Complications/Tolerance None; patient tolerated the procedure well. Chest X-ray is ordered to verify placement.   EBL Minimal   Specimen(s) None   Size 7.5 ETT Tube secured at 23 cm at lip.   Darel Hong, AGACNP-BC [facility] Prefer epic messenger for cross cover needs If after hours, please call E-link

## 2022-05-10 NOTE — Procedures (Signed)
Central Venous Catheter Insertion Procedure Note  JAYLEAH GARBERS  [id number]  1965/11/18  Date:05/10/22  Time:11:49 AM   Provider Performing:Cashawn Yanko D Dewaine Conger   Procedure: Insertion of Non-tunneled Central Venous [phone]) with US guidance ([id number])   Indication(s) Medication administration and Difficult access  Consent Risks of the procedure as well as the alternatives and risks of each were explained to the patient and/or caregiver.  Consent for the procedure was obtained and is signed in the bedside chart  Anesthesia Topical only with 1% lidocaine   Timeout Verified patient identification, verified procedure, site/side was marked, verified correct patient position, special equipment/implants available, medications/allergies/relevant history reviewed, required imaging and test results available.  Sterile Technique Maximal sterile technique including full sterile barrier drape, hand hygiene, sterile gown, sterile gloves, mask, hair covering, sterile ultrasound probe cover (if used).  Procedure Description Area of catheter insertion was cleaned with chlorhexidine and draped in sterile fashion.  With real-time ultrasound guidance a central venous catheter was placed into the left femoral vein. Nonpulsatile blood flow and easy flushing noted in all ports.  The catheter was sutured in place and sterile dressing applied.  Complications/Tolerance None; patient tolerated the procedure well. Chest X-ray is ordered to verify placement for internal jugular or subclavian cannulation.   Chest x-ray is not ordered for femoral cannulation.  EBL Minimal  Specimen(s) None   LINE secured at the 20 cm mark. BIOPATCH applied to the insertion site.   Darel Hong, AGACNP-BC [facility] Prefer epic messenger for cross cover needs If after hours, please call E-link

## 2022-05-10 NOTE — Progress Notes (Signed)
Elink following sepsis 

## 2022-05-10 NOTE — Procedures (Signed)
Arterial Catheter Insertion Procedure Note  ALISSA PHARR  [id number]  May 15, 1966  Date:05/10/22  Time:6:15 PM    Provider Performing: Bradly Bienenstock    Procedure: Insertion of Arterial Line [phone]) with US guidance ([id number])   Indication(s) Blood pressure monitoring and/or need for frequent ABGs  Consent Risks of the procedure as well as the alternatives and risks of each were explained to the patient and/or caregiver.  Consent for the procedure was obtained and is signed in the bedside chart  Anesthesia None   Time Out Verified patient identification, verified procedure, site/side was marked, verified correct patient position, special equipment/implants available, medications/allergies/relevant history reviewed, required imaging and test results available.   Sterile Technique Maximal sterile technique including full sterile barrier drape, hand hygiene, sterile gown, sterile gloves, mask, hair covering, sterile ultrasound probe cover (if used).   Procedure Description Area of catheter insertion was cleaned with chlorhexidine and draped in sterile fashion. With real-time ultrasound guidance an arterial catheter was placed into the right radial artery.  Appropriate arterial tracings confirmed on monitor.     Complications/Tolerance None; patient tolerated the procedure well.   EBL Minimal   Specimen(s) None    Darel Hong, AGACNP-BC [facility] Prefer epic messenger for cross cover needs If after hours, please call E-link

## 2022-05-10 NOTE — Consult Note (Signed)
CODE SEPSIS - PHARMACY COMMUNICATION  **Broad Spectrum Antibiotics should be administered within 1 hour of Sepsis diagnosis**  Time Code Sepsis Called/Page Received: 0931  Antibiotics Ordered: Vancomycin 200 mg IV x 1, cefepime 2 gm IV  x 1 and metronidazole 500 mg IV x 1  Time of 1st antibiotic administration: 0942  Additional action taken by pharmacy: N/A    Alison Murray ,PharmD Clinical Pharmacist  05/10/2022  9:48 AM

## 2022-05-10 NOTE — H&P (Signed)
Pulmonary and Critical care    NAME:  Tanya Graves, MRN:  [id number], DOB:  [birth year], LOS: 0 ADMISSION DATE:  05/10/2022,     History of Present Illness:  History is obtained from the patient medical chart.  The patient arrived by ambulance December 15, she was found unresponsive by the daughter.  Initial blood pressure was 90/27 heart rate 110 respiration 30.  Patient was placed on a nonrebreather by EMS. Patient remained unresponsive in the emergency room.  She was found to have profound diabetic ketoacidosis in the ER with initial labs showing potassium of 7.4 serum bicarbonate less than 7 glucose 1070 creatinine 2.12.  Initial blood gas showed pH of about 6.8-6.9.  This was venous.  The patient was given about 4 L of LR in the ER sequentially but remained hypotensive.  Subsequently started on Levophed emergent triple-lumen femoral TLC was obtained.  Patient was also started on insulin drip and bicarbonate drip however remained in DKA with pH less than 7 venous pH, patient also was eloping respiratory distress and was subsequently intubated in the ER.  Additional 2 to 3 L fluid bolus was given afterwards.    Patient recently had sinus and ear infection but did not take antibiotics due to nausea vomiting.  It appears that the patient has a 48 year old daughter that found her in the ER the neighbor is present which is able to provide minimal history.  I have spoken to siblings over the phone and given update.         05/10/2022    1:30 PM 05/10/2022    1:00 PM 05/10/2022   12:30 PM  Vitals with BMI  Systolic 622 98 89  Diastolic 41 45 29  Pulse 297 122 113        Examination: General: Unresponsive very tachypneic HENT: No clear JVD no pallor Lungs: Lungs air entry heard bilaterally no wheezing or crackles resonant to percussion anteriorly. Cardiovascular: S1-S2 no murmurs or gallops Abdomen: Soft nontender no clear bowel sounds heard.  No rebound or guarding. Extremities:  Extremity no edema or cyanosis Neuro: Mood upper extremities spontaneously but not to commands briefly GU: Foley catheter in place    Assessment & Plan:  Profound metabolic encephalopathy Diabetic ketoacidosis Lactic acidosis Shock.?  Sepsis, appears less likely Acute renal failure. Hyperkalemia secondary to acidosis Impending respiratory failure Plan Patient was emergently intubated for impending respiratory failure. Levophed and vasopressin Blood cultures obtained Abdomen CT did not reveal any acute abnormality. Respiratory virus panel pending patient recently had upper URI  Bicarbonate infusion. DKA insulin drip protocol. BMP every 4 hours. Propofol for sedation Fentanyl for pain control if needed can be started.    Best Practice (right click and "Reselect all SmartList Selections" daily)   Diet/type: NPO DVT prophylaxis: LMWH GI prophylaxis: PPI Lines: Central line Foley:  Yes, and it is still needed Code Status:  full code  Labs   CBC: Recent Labs  Lab 05/10/22 0806 05/10/22 1158  WBC 32.7* 40.7*  NEUTROABS 25.4* PENDING  HGB 12.4 11.3*  HCT 43.8 38.4  MCV 115.6* 112.0*  PLT 361 989    Basic Metabolic Panel: Recent Labs  Lab 05/10/22 0928 05/10/22 1148 05/10/22 1342  NA 135 135 135  K 7.4* >7.5* 7.3*  CL 102 98 98  CO2 <7* <7* <7*  GLUCOSE 1,070* 1,135* 1,054*  BUN 27* 29* 31*  CREATININE 2.12* 2.28* 2.39*  CALCIUM 7.9* 7.9* 7.9*  MG 2.6* 2.4  --   PHOS 10.0*  9.7*  --    GFR: Estimated Creatinine Clearance: 33.2 mL/min (A) (by C-G formula based on SCr of 2.39 mg/dL (H)). Recent Labs  Lab 05/10/22 0806 05/10/22 0807 05/10/22 1148 05/10/22 1158  WBC 32.7*  --   --  40.7*  LATICACIDVEN  --  6.6* 7.1*  --     Liver Function Tests: Recent Labs  Lab 05/10/22 0928 05/10/22 1148 05/10/22 1342  AST 32 33 38  ALT '15 20 21  '$ ALKPHOS 92 89 100  BILITOT 2.1* 2.9* 2.8*  PROT 5.5* 5.5* 5.8*  ALBUMIN 2.9* 2.8* 3.0*   Recent Labs  Lab  05/10/22 0806 05/10/22 1148  LIPASE 23 85*  AMYLASE  --  50   No results for input(s): "AMMONIA" in the last 168 hours.  ABG    Component Value Date/Time   HCO3 3.4 (L) 05/10/2022 1342   TCO2 6 (L) 05/29/2018 2041   ACIDBASEDEF 27.0 (H) 05/10/2022 1342   O2SAT 94.9 05/10/2022 1342     Coagulation Profile: No results for input(s): "INR", "PROTIME" in the last 168 hours.  Cardiac Enzymes: No results for input(s): "CKTOTAL", "CKMB", "CKMBINDEX", "TROPONINI" in the last 168 hours.  HbA1C: Hemoglobin A1C  Date/Time Value Ref Range Status  08/15/2021 02:49 PM 11.2 (A) 4.0 - 5.6 % Final  04/16/2021 03:39 PM 10.8 (A) 4.0 - 5.6 % Final  06/07/2016 12:00 AM 12.9  Final   Hgb A1c MFr Bld  Date/Time Value Ref Range Status  02/14/2022 04:00 PM 12.7 (H) 4.8 - 5.6 % Final    Comment:             Prediabetes: 5.7 - 6.4          Diabetes: >6.4          Glycemic control for adults with diabetes: <7.0   03/25/2019 04:35 PM 11.2 (H) 4.6 - 6.5 % Final    Comment:    Glycemic Control Guidelines for People with Diabetes:Non Diabetic:  <6%Goal of Therapy: <7%Additional Action Suggested:  >8%     CBG: No results for input(s): "GLUCAP" in the last 168 hours.  RADIOLOGY Chest x-ray shows endotracheal tube above the clavicles this was advised to be adjusted  Review of Systems:   Patient unable to give me any review of systems Past Medical History:  She,  has a past medical history of Arthritis, Diabetes mellitus without complication ([place]), and Thyroid disease.   Surgical History:   Past Surgical History:  Procedure Laterality Date   APPENDECTOMY  1993   CALCANEAL OSTEOTOMY Left 07/17/2016   Procedure: CALCANEAL OSTEOTOMY;  Surgeon: [name], MD;  Location: [facility] ORS;  Service: Orthopedics;  Laterality: Left;   COLONOSCOPY  12/18/2018   found 1 4m polyp, going back in 5 years    COLONOSCOPY WITH PROPOFOL N/A 12/18/2018   Procedure: COLONOSCOPY WITH PROPOFOL;  Surgeon: AJonathon Bellows MD;  [facility];  Service: Gastroenterology;  Laterality: N/A;   IRRIGATION AND DEBRIDEMENT ABSCESS N/A 01/24/2015   Procedure: IRRIGATION AND DEBRIDEMENT ABSCESS/GROIN ABSCESS;  Surgeon: CMarlyce Huge MD;  Location: [facility] ORS;  Service: General;  Laterality: N/A;   KNEE ARTHROSCOPY W/ MENISCAL REPAIR Right 2008   ROTATOR CUFF REPAIR Right 2015   TONSILLECTOMY  1998     Social History:   reports that she has been smoking cigarettes. She has a 7.50 pack-year smoking history. She has never used smokeless tobacco. She reports current alcohol use. She reports that she does not use drugs.   Family  History:  Her family history includes Alcohol abuse in her maternal grandmother; Arthritis in her mother; Breast cancer in her maternal great-grandmother; Cancer in her paternal grandfather; Diabetes in her brother, father, mother, and paternal grandmother; Heart failure in her father, maternal grandmother, and paternal grandfather; Hypertension in her brother; Kidney failure in her paternal grandmother; Transient ischemic attack in her mother.   Allergies No Known Allergies   Home Medications  Prior to Admission medications   Medication Sig Start Date End Date Taking? Authorizing Provider  albuterol (VENTOLIN HFA) 108 (90 Base) MCG/ACT inhaler Inhale 2 puffs into the lungs every 6 (six) hours as needed for wheezing or shortness of breath. 04/05/22  Yes Michela Pitcher, NP  amoxicillin-clavulanate (AUGMENTIN) 875-125 MG tablet Take 1 tablet by mouth 2 (two) times daily. 12/12-12/22 05/07/22 05/17/22 Yes [provider]  atorvastatin (LIPITOR) 20 MG tablet TAKE ONE TABLET BY MOUTH DAILY 03/11/22  Yes Shamleffer, Melanie Crazier, MD  estradiol (ESTRACE) 0.1 MG/GM vaginal cream Place 1 Applicatorful vaginally at bedtime.   Yes Harlin Heys, MD  insulin aspart (NOVOLOG) 100 UNIT/ML injection INJECT A MAXIMUM OF 60 UNITS UNDER THE SKIN DAILY 04/15/22  Yes Shamleffer,  Melanie Crazier, MD  levothyroxine (SYNTHROID) 175 MCG tablet Take 1 tablet (175 mcg total) by mouth daily with breakfast. 02/26/22  Yes Shamleffer, Melanie Crazier, MD  meloxicam (MOBIC) 15 MG tablet TAKE 1 TABLET BY MOUTH DAILY 04/16/22  Yes Edrick Kins, DPM  tirzepatide [facility]) 7.5 MG/0.5ML Pen Inject 7.5 mg into the skin once a week. 02/25/22  Yes Shamleffer, Melanie Crazier, MD  insulin glargine, 1 Unit Dial, (TOUJEO SOLOSTAR) 300 UNIT/ML Solostar Pen Inject 24 Units into the skin daily.    [provider]  nystatin cream (MYCOSTATIN) Apply 1 Application topically 2 (two) times daily. Patient not taking: Reported on 05/10/2022 03/26/22   [name], MD     Critical care time:  I have personally spent ___90_______ minutes of critical care time, exclusive of time spent on any  procedures, in evaluation and management of this critically ill patient's condition.    Charnetta Wulff MD ([place]) FOR [facility]

## 2022-05-10 NOTE — ED Notes (Signed)
Pt temp 98.5. Bare hugger placed on standby at this time.

## 2022-05-10 NOTE — ED Notes (Signed)
Blood glucose remains high. Endotool recommends maintaining current rate of 11.5.

## 2022-05-10 NOTE — ED Notes (Signed)
Glucose remains reading high. Endotool recommends maintaining current rate of 11.5

## 2022-05-10 NOTE — ED Notes (Signed)
Pt placed on bare hugger. Bladder temp 94.8

## 2022-05-10 NOTE — ED Provider Notes (Signed)
[facility] Provider Note    Event Date/Time   First MD Initiated Contact with Patient 05/10/22 [phone]     (approximate)   History   No chief complaint on file.   HPI  Tanya Graves is a 56 y.o. female with a history of diabetes and asthma who presents with unresponsive.  History from EMS is that patient has been treated for sinusitis of late.  Her daughter found her minimally responsive this morning.  She has a history of diabetes.  Blood sugar was reading high.  Unable to obtain any history from patient given she is critically ill and minimally responsive.  Reviewed patient's recent PC P visit on 12/12, she was seen for sinus congestion bilateral ear pain and cough for 2 to 3 days and an exposure to RSV.  She was treated for bacterial sinusitis with Augmentin, and was also found to have a left otitis media.  Apparently respiratory viral panel was negative.  Was treated with promethazine dextromethorphan as well.  Patient's neighbor arrives to ED.  Tells me that her foster daughter found her today with decreased responsiveness.  She has been sick for several days went to the doctor's office and got antibiotics for sinusitis and an ear infection.  She has had nausea and vomiting has not been able to tolerate the antibiotic.  Her neighbor picked her up some nausea medicine from the pharmacy yesterday and she seemed to be doing better last night was able to tolerate soup.     Past Medical History:  Diagnosis Date   Arthritis    Diabetes mellitus without complication ([place])    type 2   Thyroid disease     Patient Active Problem List   Diagnosis Date Noted   Constipation 04/29/2022   Acute cough 04/05/2022   Wheezing 04/05/2022   Acute non-recurrent maxillary sinusitis 04/05/2022   Mass of right adrenal gland ([place]) 03/26/2022   Candidal intertrigo 03/26/2022   ASCUS with positive high risk HPV cervical 10/29/2021   OSA (obstructive sleep apnea) 08/27/2021    Tobacco use 07/22/2018   Hypothyroidism 07/22/2018   Type 1 diabetes mellitus with complications ([place]) 02/58/5277     Physical Exam  Triage Vital Signs: ED Triage Vitals  Enc Vitals Group     BP 05/10/22 0812 93/77     Pulse Rate 05/10/22 0812 (!) 111     Resp 05/10/22 0812 (!) 29     Temp --      Temp src --      SpO2 05/10/22 0802 90 %     Weight --      Height --      Head Circumference --      Peak Flow --      Pain Score --      Pain Loc --      Pain Edu? --      Excl. in [place]? --     Most recent vital signs: Vitals:   05/10/22 0900 05/10/22 0930  BP: (!) 86/41 132/88  Pulse: (!) 111 (!) 109  Resp: (!) 26 (!) 25  Temp:    SpO2: 98% 100%     General: Patient appears critically ill, dry mucous membranes CV:  Good peripheral perfusion. No edema Resp:  Patient has coo smalls respirations tachypneic Abd:  No distention.  No guarding rigidity Neuro:             Patient's eyes are closed pupils are equal and reactive  patient has spontaneous groaning and coo smalls respirations, slight increasing groaning with sternal rub but no purposeful motor response Other:     ED Results / Procedures / Treatments  Labs (all labs ordered are listed, but only abnormal results are displayed) Labs Reviewed  CBC WITH DIFFERENTIAL/PLATELET - Abnormal; Notable for the following components:      Result Value   WBC 32.7 (*)    RBC 3.79 (*)    MCV 115.6 (*)    MCHC 28.3 (*)    Neutro Abs 25.4 (*)    Monocytes Absolute 1.7 (*)    Abs Immature Granulocytes 1.47 (*)    All other components within normal limits  LACTIC ACID, PLASMA - Abnormal; Notable for the following components:   Lactic Acid, Venous 6.6 (*)    All other components within normal limits  BLOOD GAS, VENOUS - Abnormal; Notable for the following components:   pCO2, Ven <18 (*)    Bicarbonate 2.9 (*)    Acid-base deficit 29.4 (*)    All other components within normal limits  URINALYSIS, ROUTINE W REFLEX  MICROSCOPIC - Abnormal; Notable for the following components:   Color, Urine STRAW (*)    APPearance CLEAR (*)    Glucose, UA >=500 (*)    Ketones, ur 80 (*)    Protein, ur 30 (*)    All other components within normal limits  COMPREHENSIVE METABOLIC PANEL - Abnormal; Notable for the following components:   Potassium 7.4 (*)    CO2 <7 (*)    Glucose, Bld 1,070 (*)    BUN 27 (*)    Creatinine, Ser 2.12 (*)    Calcium 7.9 (*)    Total Protein 5.5 (*)    Albumin 2.9 (*)    Total Bilirubin 2.1 (*)    GFR, Estimated 27 (*)    All other components within normal limits  MAGNESIUM - Abnormal; Notable for the following components:   Magnesium 2.6 (*)    All other components within normal limits  PHOSPHORUS - Abnormal; Notable for the following components:   Phosphorus 10.0 (*)    All other components within normal limits  CULTURE, BLOOD (ROUTINE X 2)  CULTURE, BLOOD (ROUTINE X 2)  RESP PANEL BY RT-PCR (RSV, FLU A&B, COVID)  RVPGX2  URINE DRUG SCREEN, QUALITATIVE ([facility] ONLY)  LIPASE, BLOOD  PREGNANCY, URINE  LACTIC ACID, PLASMA  BETA-HYDROXYBUTYRIC ACID  TSH  T4, FREE  POC URINE PREG, ED  TROPONIN I (HIGH SENSITIVITY)     EKG  EKG reviewed interpreted myself shows sinus tachycardia with normal axis prolonged QTc peaked T waves   RADIOLOGY I reviewed and interpreted the CXR which does not show any acute cardiopulmonary process    PROCEDURES:  Critical Care performed: Yes, see critical care procedure note(s)  .1-3 Lead EKG Interpretation  Performed by: Rada Hay, MD Authorized by: Rada Hay, MD     Interpretation: abnormal     ECG rate assessment: tachycardic     Rhythm: sinus tachycardia     Ectopy: none     Conduction: normal   .Critical Care  Performed by: Rada Hay, MD Authorized by: Rada Hay, MD   Critical care provider statement:    Critical care time (minutes):  100   Critical care was time spent personally by me on  the following activities:  Development of treatment plan with patient or surrogate, discussions with consultants, evaluation of patient's response to treatment, examination of patient, ordering and  review of laboratory studies, ordering and review of radiographic studies, ordering and performing treatments and interventions, pulse oximetry, re-evaluation of patient's condition and review of old charts   The patient is on the cardiac monitor to evaluate for evidence of arrhythmia and/or significant heart rate changes.   MEDICATIONS ORDERED IN ED: Medications  calcium gluconate 1 g/ 50 mL sodium chloride IVPB (1,000 mg Intravenous Not Given 05/10/22 0917)  sodium bicarbonate 150 mEq in sterile water 1,150 mL infusion ( Intravenous New Bag/Given 05/10/22 0934)  vancomycin (VANCOREADY) IVPB 2000 mg/400 mL (2,000 mg Intravenous New Bag/Given 05/10/22 1027)  insulin aspart (novoLOG) injection 10 Units (has no administration in time range)  metroNIDAZOLE (FLAGYL) IVPB 500 mg (has no administration in time range)  insulin regular, human (MYXREDLIN) 100 units/ 100 mL infusion (has no administration in time range)  dextrose 5 % in lactated ringers infusion (has no administration in time range)  dextrose 50 % solution 0-50 mL (has no administration in time range)  calcium gluconate 1 g/ 50 mL sodium chloride IVPB (has no administration in time range)  sodium bicarbonate injection 100 mEq (has no administration in time range)  lactated ringers bolus 2,000 mL (has no administration in time range)  lactated ringers bolus 1,000 mL (1,000 mLs Intravenous New Bag/Given 05/10/22 0910)  lactated ringers bolus 1,000 mL (0 mLs Intravenous Stopped 05/10/22 0956)  calcium gluconate 10 % injection (  Given 05/10/22 0813)  sodium bicarbonate injection 50 mEq (50 mEq Intravenous Given 05/10/22 0843)  ceFEPIme (MAXIPIME) 2 g in sodium chloride 0.9 % 100 mL IVPB (0 g Intravenous Stopped 05/10/22 1018)     IMPRESSION  / MDM / ASSESSMENT AND PLAN / ED COURSE  I reviewed the triage vital signs and the nursing notes.                              Patient's presentation is most consistent with acute presentation with potential threat to life or bodily function.  Differential diagnosis includes, but is not limited to, DKA, HHS, intracranial hemorrhage, hyperkalemia, uremia, overdose, sepsis, meningitis  Patient is a 56 year old female diabetic presents unresponsive.  Seen in outpatient office 3 days ago diagnosed with bacterial sinusitis and is being treated with Augmentin.  History minimal obtained from EMS that daughter found her unresponsive.  On arrival patient is coos mauling she is quite tachypneic but is minimally responsive she is groaning spontaneously and groans to sternal rub does not withdraw does not localize to pain.  Blood sugar reading high.  She is tachycardic satting okay on night nasal cannula blood pressure borderline low.  EKG showing peaked T waves and concern for DKA causing her decreased mental status and given my concern for severe acidosis did decide to hold off on intubation as patient is breathing spontaneously and her end-tidal was 11 with EMS I think that she is probably compensating the best possible currently.  Started 2 L wide open patient given an amp of calcium gluconate for presumed hyperkalemia in the setting of acidosis, due to peaked T waves.  Broad workup sent.  Patient has leukocytosis she is hypothermic will cover broadly with cefepime and vancomycin.  This would cover potential meningitis, history does not really seem consistent with this and patient has supple neck.   Patient currently on a Retail banker.  Starting to drop her pressure.  Low threshold to start pressors.  Her CMP was hemolyzed.  EKG is very suggestive  of hyperkalemia so I will give 10 units of IV insulin but will defer insulin drip until confirmation of potassium.  On reassessment patient is now opening her eyes  to sternal rub.  I think it is okay to continue to avoid intubation especially in the setting of severe acidosis.  Expect that she will improve as her DKA is treated.  Patient's potassium is 7.4.  Sugar over thousand.  Has AKI.  On reassessment blood pressure stabilized not needing pressors currently.  She is starting to move more spontaneously still not much purposeful response and no verbal response.  Spoke with ICU attending who recommends additional 2 A of bicarb and additional 2 L of fluid which I have ordered.  They will see the patient and admit.     FINAL CLINICAL IMPRESSION(S) / ED DIAGNOSES   Final diagnoses:  Diabetic ketoacidosis with coma associated with type 2 diabetes mellitus (HCC)  Sepsis, due to unspecified organism, unspecified whether acute organ dysfunction present ([place])  Hypothermia, initial encounter     Rx / DC Orders   ED Discharge Orders     None        Note:  This document was prepared using Dragon voice recognition software and may include unintentional dictation errors.   Rada Hay, MD 05/10/22 [phone]

## 2022-05-10 NOTE — Consult Note (Addendum)
PHARMACY CONSULT NOTE - FOLLOW UP  Pharmacy Consult for Electrolyte Monitoring and Replacement   Recent Labs: Potassium (mmol/L)  Date Value  05/10/2022 7.3 (HH)  06/30/2014 4.6   Magnesium (mg/dL)  Date Value  05/10/2022 2.4   Calcium (mg/dL)  Date Value  05/10/2022 7.9 (L)   Calcium, Total (mg/dL)  Date Value  06/30/2014 9.1   Albumin (g/dL)  Date Value  05/10/2022 3.0 (L)  02/12/2014 3.7   Phosphorus (mg/dL)  Date Value  05/10/2022 9.7 (H)   Sodium (mmol/L)  Date Value  05/10/2022 135  06/30/2014 135 (L)     Assessment: 56 y.o. female with a history of diabetes and asthma who presented to the ED on 12/15 unresponsive. Her daughter found her minimally responsive this morning. She has a history of diabetes. Blood sugar was reading high. Patient was started on insulin drip and levophed d/t hyperglycemia and being hypotensive upon arrival. Pharmacy will monitor and replace electrolytes while under PCCM care.  IVF: sodium bicarbonate 183mq'@150ml'$ /hr, D5LR'@125ml'$ /hr  Goal of Therapy:  Electrolytes WNL  Plan:  Hyperkalemia: IV insulin 10 units + Calcium gluconate IV 1g ordered  --Will recheck BMP q4h while on insulin drip. Replacing electrolytes as appropriate   WPearla Dubonnet,PharmD Clinical Pharmacist 05/10/2022 2:53 PM

## 2022-05-10 NOTE — Consult Note (Signed)
PHARMACY -  BRIEF ANTIBIOTIC NOTE   Pharmacy has received consult(s) for code sepsis from an ED provider.  The patient's profile has been reviewed for ht/wt/allergies/indication/available labs.    One time order(s) placed for Vancomycin 2000 mg IV x 1, cefepime 2 gm IV x 1 and Flagyl 500 mg IV x 1  Further antibiotics/pharmacy consults should be ordered by admitting physician if indicated.                       Thank you, Alison Murray 05/10/2022  9:45 AM

## 2022-05-10 NOTE — ED Notes (Signed)
B/P remains low despite Levo at 57mg/min. Per JTana Conch NP Levo to 183m/min.

## 2022-05-10 NOTE — Consult Note (Signed)
Pharmacy Antibiotic Note  Tanya Graves is a 56 y.o. female admitted on 05/10/2022 with pneumonia.  Pharmacy has been consulted for Vancomycin and Cefepime dosing.  Patient currently in AKI(baseline SCR~1), currently 2.39.   Plan: Patient given Vancomycin 2g IV x 1 in ED. Will defer AUC dosing currently until renal function improves. - Will order Vancomycin random on 12/16'@1200'$   Cefepime 2g IV Q12 hours  Will continue to monitor, adjust, and de-escalate antibiotics as appropriate   Height: '5\' 8"'$  (172.7 cm) Weight: 102 kg (224 lb 13.9 oz) IBW/kg (Calculated) : 63.9  Temp (24hrs), Avg:97.2 F (36.2 C), Min:95.5 F (35.3 C), Max:98.5 F (36.9 C)  Recent Labs  Lab 05/10/22 0806 05/10/22 0807 05/10/22 0928 05/10/22 1148 05/10/22 1158 05/10/22 1342  WBC 32.7*  --   --   --  40.7*  --   CREATININE  --   --  2.12* 2.28*  --  2.39*  LATICACIDVEN  --  6.6*  --  7.1*  --   --     Estimated Creatinine Clearance: 33.2 mL/min (A) (by C-G formula based on SCr of 2.39 mg/dL (H)).    No Known Allergies  Antimicrobials this admission: Vancomycin 12/15 >>  Cefepime 12/15 >>  Flagyl 12/15 x 1  Dose adjustments this admission: N/A  Microbiology results: 12/15 BCx: collected  Thank you for allowing pharmacy to be a part of this patient's care.  Lance Coon A Loc Feinstein 05/10/2022 3:14 PM

## 2022-05-11 ENCOUNTER — Inpatient Hospital Stay: Payer: BC Managed Care – PPO

## 2022-05-11 ENCOUNTER — Inpatient Hospital Stay (HOSPITAL_COMMUNITY)
Admit: 2022-05-11 | Discharge: 2022-05-11 | Disposition: A | Discharge status: Home / Self Care | Payer: BC Managed Care – PPO | Attending: Pulmonary Disease | Admitting: Pulmonary Disease

## 2022-05-11 DIAGNOSIS — R571 Hypovolemic shock: Secondary | ICD-10-CM

## 2022-05-11 DIAGNOSIS — N179 Acute kidney failure, unspecified: Secondary | ICD-10-CM | POA: Diagnosis not present

## 2022-05-11 DIAGNOSIS — T68XXXA Hypothermia, initial encounter: Secondary | ICD-10-CM | POA: Diagnosis not present

## 2022-05-11 LAB — BASIC METABOLIC PANEL
Anion gap: 13 (ref 5–15)
Anion gap: 10 (ref 5–15)
Anion gap: 11 (ref 5–15)
Anion gap: 10 (ref 5–15)
Anion gap: 9 (ref 5–15)
BUN: 27 mg/dL — ABNORMAL HIGH (ref 6–20)
BUN: 25 mg/dL — ABNORMAL HIGH (ref 6–20)
BUN: 27 mg/dL — ABNORMAL HIGH (ref 6–20)
BUN: 25 mg/dL — ABNORMAL HIGH (ref 6–20)
BUN: 24 mg/dL — ABNORMAL HIGH (ref 6–20)
CO2: 26 mmol/L (ref 22–32)
CO2: 25 mmol/L (ref 22–32)
CO2: 26 mmol/L (ref 22–32)
CO2: 27 mmol/L (ref 22–32)
CO2: 28 mmol/L (ref 22–32)
Calcium: 7.8 mg/dL — ABNORMAL LOW (ref 8.9–10.3)
Calcium: 7 mg/dL — ABNORMAL LOW (ref 8.9–10.3)
Calcium: 7.8 mg/dL — ABNORMAL LOW (ref 8.9–10.3)
Calcium: 7.7 mg/dL — ABNORMAL LOW (ref 8.9–10.3)
Calcium: 7.6 mg/dL — ABNORMAL LOW (ref 8.9–10.3)
Chloride: 110 mmol/L (ref 98–111)
Chloride: 113 mmol/L — ABNORMAL HIGH (ref 98–111)
Chloride: 111 mmol/L (ref 98–111)
Chloride: 109 mmol/L (ref 98–111)
Chloride: 109 mmol/L (ref 98–111)
Creatinine, Ser: 1.56 mg/dL — ABNORMAL HIGH (ref 0.44–1.00)
Creatinine, Ser: 1.3 mg/dL — ABNORMAL HIGH (ref 0.44–1.00)
Creatinine, Ser: 1.26 mg/dL — ABNORMAL HIGH (ref 0.44–1.00)
Creatinine, Ser: 1.35 mg/dL — ABNORMAL HIGH (ref 0.44–1.00)
Creatinine, Ser: 1.16 mg/dL — ABNORMAL HIGH (ref 0.44–1.00)
GFR, Estimated: 39 mL/min — ABNORMAL LOW (ref >60)
GFR, Estimated: 49 mL/min — ABNORMAL LOW (ref >60)
GFR, Estimated: 50 mL/min — ABNORMAL LOW (ref >60)
GFR, Estimated: 46 mL/min — ABNORMAL LOW (ref >60)
GFR, Estimated: 56 mL/min — ABNORMAL LOW (ref >60)
Glucose, Bld: 150 mg/dL — ABNORMAL HIGH (ref 70–99)
Glucose, Bld: 135 mg/dL — ABNORMAL HIGH (ref 70–99)
Glucose, Bld: 163 mg/dL — ABNORMAL HIGH (ref 70–99)
Glucose, Bld: 208 mg/dL — ABNORMAL HIGH (ref 70–99)
Glucose, Bld: 216 mg/dL — ABNORMAL HIGH (ref 70–99)
Potassium: 3.2 mmol/L — ABNORMAL LOW (ref 3.5–5.1)
Potassium: 3.1 mmol/L — ABNORMAL LOW (ref 3.5–5.1)
Potassium: 3.5 mmol/L (ref 3.5–5.1)
Potassium: 3.9 mmol/L (ref 3.5–5.1)
Potassium: 4.1 mmol/L (ref 3.5–5.1)
Sodium: 149 mmol/L — ABNORMAL HIGH (ref 135–145)
Sodium: 148 mmol/L — ABNORMAL HIGH (ref 135–145)
Sodium: 148 mmol/L — ABNORMAL HIGH (ref 135–145)
Sodium: 146 mmol/L — ABNORMAL HIGH (ref 135–145)
Sodium: 146 mmol/L — ABNORMAL HIGH (ref 135–145)

## 2022-05-11 LAB — GLUCOSE, CAPILLARY
Glucose-Capillary: 134 mg/dL — ABNORMAL HIGH (ref 70–99)
Glucose-Capillary: 138 mg/dL — ABNORMAL HIGH (ref 70–99)
Glucose-Capillary: 140 mg/dL — ABNORMAL HIGH (ref 70–99)
Glucose-Capillary: 143 mg/dL — ABNORMAL HIGH (ref 70–99)
Glucose-Capillary: 147 mg/dL — ABNORMAL HIGH (ref 70–99)
Glucose-Capillary: 153 mg/dL — ABNORMAL HIGH (ref 70–99)
Glucose-Capillary: 159 mg/dL — ABNORMAL HIGH (ref 70–99)
Glucose-Capillary: 163 mg/dL — ABNORMAL HIGH (ref 70–99)
Glucose-Capillary: 164 mg/dL — ABNORMAL HIGH (ref 70–99)
Glucose-Capillary: 187 mg/dL — ABNORMAL HIGH (ref 70–99)
Glucose-Capillary: 190 mg/dL — ABNORMAL HIGH (ref 70–99)
Glucose-Capillary: 204 mg/dL — ABNORMAL HIGH (ref 70–99)
Glucose-Capillary: 233 mg/dL — ABNORMAL HIGH (ref 70–99)
Glucose-Capillary: 303 mg/dL — ABNORMAL HIGH (ref 70–99)

## 2022-05-11 LAB — ECHOCARDIOGRAM COMPLETE
AV Peak grad: 11.6 mmHg
Ao pk vel: 1.7 m/s
Area-P 1/2: 2.97 cm2
Height: 68 in
S' Lateral: 3 cm
Single Plane A4C EF: 59.1 %
Weight: 3580.27 oz

## 2022-05-11 LAB — CBC
HCT: 29.8 % — ABNORMAL LOW (ref 36.0–46.0)
Hemoglobin: 10.4 g/dL — ABNORMAL LOW (ref 12.0–15.0)
MCH: 32.1 pg (ref 26.0–34.0)
MCHC: 34.9 g/dL (ref 30.0–36.0)
MCV: 92 fL (ref 80.0–100.0)
Platelets: 227 10*3/uL (ref 150–400)
RBC: 3.24 MIL/uL — ABNORMAL LOW (ref 3.87–5.11)
RDW: 11.8 % (ref 11.5–15.5)
WBC: 19.4 10*3/uL — ABNORMAL HIGH (ref 4.0–10.5)
nRBC: 0 % (ref 0.0–0.2)

## 2022-05-11 LAB — TRIGLYCERIDES: Triglycerides: 62 mg/dL (ref <150)

## 2022-05-11 LAB — PHOSPHORUS
Phosphorus: 2.2 mg/dL — ABNORMAL LOW (ref 2.5–4.6)
Phosphorus: 3.3 mg/dL (ref 2.5–4.6)

## 2022-05-11 LAB — MAGNESIUM: Magnesium: 1.7 mg/dL (ref 1.7–2.4)

## 2022-05-11 LAB — VANCOMYCIN, RANDOM: Vancomycin Rm: 9 ug/mL

## 2022-05-11 LAB — PROCALCITONIN: Procalcitonin: 12.26 ng/mL

## 2022-05-11 MED ORDER — ZIPRASIDONE MESYLATE 20 MG IM SOLR
10.0000 mg | Freq: Once | INTRAMUSCULAR | Status: AC
  Administered 2022-05-11: 10 mg via INTRAMUSCULAR
  Filled 2022-05-11: qty 20

## 2022-05-11 MED ORDER — POTASSIUM CHLORIDE 10 MEQ/50ML IV SOLN
10.0000 meq | INTRAVENOUS | Status: DC
  Administered 2022-05-11 (×3): 10 meq via INTRAVENOUS
  Filled 2022-05-11 (×4): qty 50

## 2022-05-11 MED ORDER — ORAL CARE MOUTH RINSE: 15.0000 mL | OROMUCOSAL | Status: DC | PRN

## 2022-05-11 MED ORDER — SODIUM CHLORIDE 0.9 % IV SOLN
2.0000 g | Freq: Three times a day (TID) | INTRAVENOUS | Status: DC
  Administered 2022-05-11 – 2022-05-13 (×5): 2 g via INTRAVENOUS
  Filled 2022-05-11: qty 12.5
  Filled 2022-05-11 (×4): qty 2
  Filled 2022-05-11: qty 12.5

## 2022-05-11 MED ORDER — INSULIN GLARGINE-YFGN 100 UNIT/ML ~~LOC~~ SOLN
15.0000 [IU] | Freq: Two times a day (BID) | SUBCUTANEOUS | Status: DC
  Filled 2022-05-11: qty 0.15

## 2022-05-11 MED ORDER — INSULIN ASPART 100 UNIT/ML IJ SOLN
0.0000 [IU] | INTRAMUSCULAR | Status: DC
  Administered 2022-05-11 (×3): 3 [IU] via SUBCUTANEOUS
  Administered 2022-05-11 (×2): 5 [IU] via SUBCUTANEOUS
  Administered 2022-05-12: 3 [IU] via SUBCUTANEOUS
  Administered 2022-05-12: 5 [IU] via SUBCUTANEOUS
  Administered 2022-05-12: 2 [IU] via SUBCUTANEOUS
  Administered 2022-05-12: 3 [IU] via SUBCUTANEOUS
  Administered 2022-05-13: 2 [IU] via SUBCUTANEOUS
  Administered 2022-05-13: 3 [IU] via SUBCUTANEOUS
  Administered 2022-05-13 (×2): 2 [IU] via SUBCUTANEOUS
  Administered 2022-05-13: 3 [IU] via SUBCUTANEOUS
  Filled 2022-05-11 (×12): qty 1

## 2022-05-11 MED ORDER — POTASSIUM CHLORIDE 10 MEQ/50ML IV SOLN
10.0000 meq | INTRAVENOUS | Status: DC
  Filled 2022-05-11 (×4): qty 50

## 2022-05-11 MED ORDER — ACETAMINOPHEN 10 MG/ML IV SOLN
1000.0000 mg | Freq: Four times a day (QID) | INTRAVENOUS | Status: AC
  Administered 2022-05-11: 1000 mg via INTRAVENOUS
  Filled 2022-05-11 (×4): qty 100

## 2022-05-11 MED ORDER — NOREPINEPHRINE 4 MG/250ML-% IV SOLN
0.0000 ug/min | INTRAVENOUS | Status: DC
  Administered 2022-05-11: 4 ug/min via INTRAVENOUS
  Filled 2022-05-11: qty 250

## 2022-05-11 MED ORDER — POTASSIUM CHLORIDE 10 MEQ/100ML IV SOLN
10.0000 meq | INTRAVENOUS | Status: DC
  Filled 2022-05-11 (×4): qty 100

## 2022-05-11 MED ORDER — POTASSIUM CHLORIDE 10 MEQ/50ML IV SOLN
10.0000 meq | INTRAVENOUS | Status: AC
  Administered 2022-05-11 (×2): 10 meq via INTRAVENOUS
  Filled 2022-05-11 (×2): qty 50

## 2022-05-11 MED ORDER — DEXMEDETOMIDINE HCL IN NACL 400 MCG/100ML IV SOLN
0.4000 ug/kg/h | INTRAVENOUS | Status: DC
  Administered 2022-05-11 (×2): 0.4 ug/kg/h via INTRAVENOUS
  Administered 2022-05-12: 1.2 ug/kg/h via INTRAVENOUS
  Administered 2022-05-12: 0.8 ug/kg/h via INTRAVENOUS
  Administered 2022-05-12: 0.4 ug/kg/h via INTRAVENOUS
  Administered 2022-05-12 – 2022-05-13 (×3): 1.2 ug/kg/h via INTRAVENOUS
  Administered 2022-05-13: 0.8 ug/kg/h via INTRAVENOUS
  Administered 2022-05-13 (×2): 1.2 ug/kg/h via INTRAVENOUS
  Filled 2022-05-11 (×10): qty 100

## 2022-05-11 MED ORDER — ACETAMINOPHEN 325 MG PO TABS
650.0000 mg | ORAL_TABLET | Freq: Four times a day (QID) | ORAL | Status: DC | PRN
  Administered 2022-05-11 – 2022-05-15 (×3): 650 mg
  Filled 2022-05-11 (×3): qty 2

## 2022-05-11 MED ORDER — LACTATED RINGERS IV BOLUS
1000.0000 mL | Freq: Once | INTRAVENOUS | Status: AC
  Administered 2022-05-11: 1000 mL via INTRAVENOUS

## 2022-05-11 MED ORDER — POTASSIUM PHOSPHATES 15 MMOLE/5ML IV SOLN
20.0000 mmol | Freq: Once | INTRAVENOUS | Status: AC
  Administered 2022-05-11: 20 mmol via INTRAVENOUS
  Filled 2022-05-11: qty 6.67

## 2022-05-11 MED ORDER — MAGNESIUM SULFATE 2 GM/50ML IV SOLN
2.0000 g | Freq: Once | INTRAVENOUS | Status: AC
  Administered 2022-05-11: 2 g via INTRAVENOUS
  Filled 2022-05-11: qty 50

## 2022-05-11 MED ORDER — LACTATED RINGERS IV SOLN: INTRAVENOUS | Status: DC

## 2022-05-11 MED ORDER — VANCOMYCIN HCL 1750 MG/350ML IV SOLN
1750.0000 mg | INTRAVENOUS | Status: DC
  Administered 2022-05-11: 1750 mg via INTRAVENOUS
  Filled 2022-05-11 (×2): qty 350

## 2022-05-11 MED ORDER — DEXMEDETOMIDINE HCL IN NACL 400 MCG/100ML IV SOLN
INTRAVENOUS | Status: AC
  Administered 2022-05-11: 0.4 ug/kg/h via INTRAVENOUS
  Filled 2022-05-11: qty 100

## 2022-05-11 MED ORDER — INSULIN GLARGINE-YFGN 100 UNIT/ML ~~LOC~~ SOLN
15.0000 [IU] | Freq: Two times a day (BID) | SUBCUTANEOUS | Status: DC
  Administered 2022-05-11 – 2022-05-13 (×6): 15 [IU] via SUBCUTANEOUS
  Filled 2022-05-11 (×7): qty 0.15

## 2022-05-11 NOTE — Progress Notes (Signed)
*  PRELIMINARY RESULTS* Echocardiogram 2D Echocardiogram has been performed.  Tanya Graves 05/11/2022, 8:14 AM

## 2022-05-11 NOTE — Consult Note (Signed)
Pharmacy Antibiotic Note  Tanya Graves is a 56 y.o. female admitted on 05/10/2022 with sepsis. Pharmacy has been consulted for Vancomycin and Cefepime dosing.  Scr improved to 1.26  Plan: Patient given Vancomycin 2g IV x 1 in ED.  -Vancomycin random on 12/16 @ 0856= 9 mcg/ml due to AKI. Will begin Vancomycin 1750 mg IV Q 24 hrs. Goal AUC 400-550. Expected AUC: 513 Cmin 12.7 SCr used: 1.26   -adjust Cefepime 2g IV from Q12 hours to Q8h for Crcl 62.8 ml/min  Will continue to monitor, adjust, and de-escalate antibiotics as appropriate   Height: '5\' 8"'$  (172.7 cm) Weight: 101.5 kg (223 lb 12.3 oz) IBW/kg (Calculated) : 63.9  Temp (24hrs), Avg:99.4 F (37.4 C), Min:97.1 F (36.2 C), Max:100.9 F (38.3 C)  Recent Labs  Lab 05/10/22 0806 05/10/22 0807 05/10/22 0928 05/10/22 1148 05/10/22 1158 05/10/22 1342 05/10/22 1546 05/10/22 2020 05/11/22 0157 05/11/22 0518 05/11/22 0856  WBC 32.7*  --   --   --  40.7*  --   --   --   --   --  19.4*  CREATININE  --   --    < > 2.28*  --  2.39*  --  1.81* 1.56* 1.30* 1.26*  LATICACIDVEN  --  6.6*  --  7.1*  --   --  4.5* 4.7*  --   --   --   VANCORANDOM  --   --   --   --   --   --   --   --   --   --  9   < > = values in this interval not displayed.     Estimated Creatinine Clearance: 62.8 mL/min (A) (by C-G formula based on SCr of 1.26 mg/dL (H)).    No Known Allergies  Antimicrobials this admission: Vancomycin 12/15 >>  Cefepime 12/15 >>  Flagyl 12/15 x 1  Dose adjustments this admission: N/A  Microbiology results: 12/15 [id number] 12/15 MRSA PCR neg 12/16 CXR neg   Thank you for allowing pharmacy to be a part of this patient's care.  Amberlie Gaillard A 05/11/2022 11:37 AM

## 2022-05-11 NOTE — Inpatient Diabetes Management (Signed)
Inpatient Diabetes Program Recommendations  AACE/ADA: New Consensus Statement on Inpatient Glycemic Control (2015)  Target Ranges:  Prepandial:   less than 140 mg/dL      Peak postprandial:   less than 180 mg/dL (1-2 hours)      Critically ill patients:  140 - 180 mg/dL   Lab Results  Component Value Date   GLUCAP 190 (H) 05/11/2022   HGBA1C 12.7 (H) 02/14/2022    Review of Glycemic Control  Latest Reference Range & Units 05/11/22 07:09 05/11/22 08:37 05/11/22 09:40 05/11/22 10:35 05/11/22 12:39  Glucose-Capillary 70 - 99 mg/dL 134 (H) 147 (H) 163 (H) 164 (H) 190 (H)   Diabetes history: DM 2 Outpatient Diabetes medications: Novolog 18 units tid, Mounjaro 7.5 mg Daily, Toujeo 26 units Current orders for Inpatient glycemic control:  Semglee 15 units bid Novolog 0-15 units tid  Last Endocrinology visit on 10/2 medications increased at that visit  Inpatient Diabetes Program Recommendations:    -   Increase Semglee to 22 units -   Add Novolog 5 units tid meal coverage if eating >50% of meals  Thanks,  Tama Headings RN, MSN, BC-ADM Inpatient Diabetes Coordinator Team Pager [phone] (8a-5p)

## 2022-05-11 NOTE — Progress Notes (Signed)
Pulmonary and Critical care    NAME:  Tanya Graves, MRN:  [id number], DOB:  [birth year], LOS: 1 ADMISSION DATE:  05/10/2022,     History of Present Illness:   History is obtained from the patient medical chart.  The patient arrived by ambulance December 15, she was found unresponsive by the daughter.  Initial blood pressure was 90/27 heart rate 110 respiration 30.  Patient was placed on a nonrebreather by EMS. Patient remained unresponsive in the emergency room.  She was found to have profound diabetic ketoacidosis in the ER with initial labs showing potassium of 7.4 serum bicarbonate less than 7 glucose 1070 creatinine 2.12.  Initial blood gas showed pH of about 6.8-6.9.  This was venous.  The patient was given about 4 L of LR in the ER sequentially but remained hypotensive.  Subsequently started on Levophed emergent triple-lumen femoral TLC was obtained.  Patient was also started on insulin drip and bicarbonate drip however remained in DKA with pH less than 7 venous pH, patient also was eloping respiratory distress and was subsequently intubated in the ER.  Additional 2 to 3 L fluid bolus was given afterwards.     Patient recently had sinus and ear infection but did not take antibiotics due to nausea vomiting.  It appears that the patient has a 17 year old daughter that found her in the ER the neighbor is present which is able to provide minimal history.  I have spoken to siblings over the phone and given update. December 16.  The patient has been now off insulin drip.  BMP improved.  The patient was on spontaneous breathing trial off sedation she was extubated she has been delirious.  Postextubation requiring Precedex.  Has been afebrile and hemodynamically stable not requiring any vasopressors.          05/11/2022   11:00 AM 05/11/2022   10:30 AM 05/11/2022   10:00 AM  Vitals with BMI  Pulse 120 118 121    Vent Mode: PRVC FiO2 (%):  [40 %-60 %] 40 % Set Rate:  [28 bmp-34 bmp] 28  bmp Vt Set:  [480 mL] 480 mL PEEP:  [5 cmH20] 5 cmH20 Plateau Pressure:  [18 cmH20] 18 cmH20   Examination: General: Delirious postextubation.  Agitated moving all extremities vigorously and spontaneously moaning out loud  HENT: No JVD no icterus Lungs: Air entry was heard bilaterally no wheezing or crackles Cardiovascular: Heart S1-S2 no murmurs no parasternal heave Abdomen: Obese nontender Extremities: No edema or cyanosis Neuro: Moving all extremities spontaneously    Assessment & Plan:  Profound metabolic encephalopathy Diabetic ketoacidosis Lactic acidosis Shock.?  Sepsis, appears less likely not requiring vasopressors today. Improved renal failure acute renal failure. Improved acidosis improving respiratory failure  Plan The patient was extubated today Persistent metabolic encephalopathy requiring Precedex. Hopefully will be able to de-escalate antibiotics tomorrow Off vasopressors. Long-acting insulin along with sliding scale Continue IV fluids. Patient examined before and after extubation. Best Practice (right click and "Reselect all SmartList Selections" daily)   Diet/type: NPO DVT prophylaxis: LMWH GI prophylaxis: PPI Lines: Central line Foley:  Yes, and it is still needed Code Status:  full code  Labs   CBC: Recent Labs  Lab 05/10/22 0806 05/10/22 1158 05/11/22 0856  WBC 32.7* 40.7* 19.4*  NEUTROABS 25.4* 34.2*  --   HGB 12.4 11.3* 10.4*  HCT 43.8 38.4 29.8*  MCV 115.6* 112.0* 92.0  PLT 361 304 481    Basic Metabolic Panel: Recent Labs  Lab 05/10/22  3244 05/10/22 1148 05/10/22 1342 05/10/22 2020 05/11/22 0157 05/11/22 0518 05/11/22 0856  NA 135 135 135 146* 149* 148* 148*  K 7.4* >7.5* 7.3* 3.9 3.2* 3.1* 3.5  CL 102 98 98 107 110 113* 111  CO2 <7* <7* <7* 17* '26 25 26  '$ GLUCOSE 1,070* 1,135* 1,054* 443* 150* 135* 163*  BUN 27* 29* 31* 27* 27* 25* 27*  CREATININE 2.12* 2.28* 2.39* 1.81* 1.56* 1.30* 1.26*  CALCIUM 7.9* 7.9* 7.9* 7.7*  7.8* 7.0* 7.8*  MG 2.6* 2.4  --   --   --  1.7  --   PHOS 10.0* 9.7*  --   --   --  2.2*  --    GFR: Estimated Creatinine Clearance: 62.8 mL/min (A) (by C-G formula based on SCr of 1.26 mg/dL (H)). Recent Labs  Lab 05/10/22 0806 05/10/22 0807 05/10/22 1148 05/10/22 1158 05/10/22 1546 05/10/22 2020 05/11/22 0518 05/11/22 0856  PROCALCITON  --   --  7.22  --   --   --  12.26  --   WBC 32.7*  --   --  40.7*  --   --   --  19.4*  LATICACIDVEN  --  6.6* 7.1*  --  4.5* 4.7*  --   --     Liver Function Tests: Recent Labs  Lab 05/10/22 0928 05/10/22 1148 05/10/22 1342  AST 32 33 38  ALT '15 20 21  '$ ALKPHOS 92 89 100  BILITOT 2.1* 2.9* 2.8*  PROT 5.5* 5.5* 5.8*  ALBUMIN 2.9* 2.8* 3.0*   Recent Labs  Lab 05/10/22 0806 05/10/22 1148  LIPASE 23 85*  AMYLASE  --  50   No results for input(s): "AMMONIA" in the last 168 hours.  ABG    Component Value Date/Time   PHART 7.46 (H) 05/10/2022 2033   PCO2ART 27 (L) 05/10/2022 2033   PO2ART 172 (H) 05/10/2022 2033   HCO3 19.2 (L) 05/10/2022 2033   TCO2 6 (L) 05/29/2018 2041   ACIDBASEDEF 3.2 (H) 05/10/2022 2033   O2SAT 99.8 05/10/2022 2033     Coagulation Profile: No results for input(s): "INR", "PROTIME" in the last 168 hours.  Cardiac Enzymes: No results for input(s): "CKTOTAL", "CKMB", "CKMBINDEX", "TROPONINI" in the last 168 hours.  HbA1C: Hemoglobin A1C  Date/Time Value Ref Range Status  08/15/2021 02:49 PM 11.2 (A) 4.0 - 5.6 % Final  04/16/2021 03:39 PM 10.8 (A) 4.0 - 5.6 % Final  06/07/2016 12:00 AM 12.9  Final   Hgb A1c MFr Bld  Date/Time Value Ref Range Status  02/14/2022 04:00 PM 12.7 (H) 4.8 - 5.6 % Final    Comment:             Prediabetes: 5.7 - 6.4          Diabetes: >6.4          Glycemic control for adults with diabetes: <7.0   03/25/2019 04:35 PM 11.2 (H) 4.6 - 6.5 % Final    Comment:    Glycemic Control Guidelines for People with Diabetes:Non Diabetic:  <6%Goal of Therapy: <7%Additional Action  Suggested:  >8%     CBG: Recent Labs  Lab 05/11/22 0605 05/11/22 0709 05/11/22 0837 05/11/22 0940 05/11/22 1035  GLUCAP 140* 134* 147* 163* 164*    RADIOLOGY   Review of Systems:      Past Medical History:  She,  has a past medical history of Arthritis, Diabetes mellitus without complication ([place]), and Thyroid disease.   Surgical History:   Past  Surgical History:  Procedure Laterality Date   APPENDECTOMY  1993   CALCANEAL OSTEOTOMY Left 07/17/2016   Procedure: CALCANEAL OSTEOTOMY;  Surgeon: [name], MD;  Location: [facility] ORS;  Service: Orthopedics;  Laterality: Left;   COLONOSCOPY  12/18/2018   found 1 27m polyp, going back in 5 years    COLONOSCOPY WITH PROPOFOL N/A 12/18/2018   Procedure: COLONOSCOPY WITH PROPOFOL;  Surgeon: AJonathon Bellows MD;  [facility];  Service: Gastroenterology;  Laterality: N/A;   IRRIGATION AND DEBRIDEMENT ABSCESS N/A 01/24/2015   Procedure: IRRIGATION AND DEBRIDEMENT ABSCESS/GROIN ABSCESS;  Surgeon: CMarlyce Huge MD;  Location: [facility] ORS;  Service: General;  Laterality: N/A;   KNEE ARTHROSCOPY W/ MENISCAL REPAIR Right 2008   ROTATOR CUFF REPAIR Right 2015   TONSILLECTOMY  1998     Social History:   reports that she has been smoking cigarettes. She has a 7.50 pack-year smoking history. She has never used smokeless tobacco. She reports current alcohol use. She reports that she does not use drugs.   Family History:  Her family history includes Alcohol abuse in her maternal grandmother; Arthritis in her mother; Breast cancer in her maternal great-grandmother; Cancer in her paternal grandfather; Diabetes in her brother, father, mother, and paternal grandmother; Heart failure in her father, maternal grandmother, and paternal grandfather; Hypertension in her brother; Kidney failure in her paternal grandmother; Transient ischemic attack in her mother.   Allergies No Known Allergies   Home Medications  Prior to Admission  medications   Medication Sig Start Date End Date Taking? Authorizing Provider  albuterol (VENTOLIN HFA) 108 (90 Base) MCG/ACT inhaler Inhale 2 puffs into the lungs every 6 (six) hours as needed for wheezing or shortness of breath. 04/05/22  Yes CMichela Pitcher NP  amoxicillin-clavulanate (AUGMENTIN) 875-125 MG tablet Take 1 tablet by mouth 2 (two) times daily. 12/12-12/22 05/07/22 05/17/22 Yes [provider]  atorvastatin (LIPITOR) 20 MG tablet TAKE ONE TABLET BY MOUTH DAILY 03/11/22  Yes Shamleffer, IMelanie Crazier MD  estradiol (ESTRACE) 0.1 MG/GM vaginal cream Place 1 Applicatorful vaginally at bedtime.   Yes EHarlin Heys MD  insulin aspart (NOVOLOG) 100 UNIT/ML injection INJECT A MAXIMUM OF 60 UNITS UNDER THE SKIN DAILY 04/15/22  Yes Shamleffer, IMelanie Crazier MD  levothyroxine (SYNTHROID) 175 MCG tablet Take 1 tablet (175 mcg total) by mouth daily with breakfast. 02/26/22  Yes Shamleffer, IMelanie Crazier MD  meloxicam (MOBIC) 15 MG tablet TAKE 1 TABLET BY MOUTH DAILY 04/16/22  Yes EEdrick Kins DPM  tirzepatide ([facility] 7.5 MG/0.5ML Pen Inject 7.5 mg into the skin once a week. 02/25/22  Yes Shamleffer, IMelanie Crazier MD  insulin glargine, 1 Unit Dial, (TOUJEO SOLOSTAR) 300 UNIT/ML Solostar Pen Inject 24 Units into the skin daily.    [provider]  nystatin cream (MYCOSTATIN) Apply 1 Application topically 2 (two) times daily. Patient not taking: Reported on 05/10/2022 03/26/22   BJinny Sanders MD     Critical care time:  I have personally spent _____60_____ minutes of critical care time, exclusive of time spent on any  procedures, in evaluation and management of this critically ill patient's condition.    Cyprian Gongaware MD ([facility]) FOR [facility]

## 2022-05-11 NOTE — Consult Note (Signed)
PHARMACY CONSULT NOTE - FOLLOW UP  Pharmacy Consult for Electrolyte Monitoring and Replacement   Recent Labs: Potassium (mmol/L)  Date Value  05/11/2022 4.1  06/30/2014 4.6   Magnesium (mg/dL)  Date Value  05/11/2022 1.7   Calcium (mg/dL)  Date Value  05/11/2022 7.6 (L)   Calcium, Total (mg/dL)  Date Value  06/30/2014 9.1   Albumin (g/dL)  Date Value  05/10/2022 3.0 (L)  02/12/2014 3.7   Phosphorus (mg/dL)  Date Value  05/11/2022 3.3   Sodium (mmol/L)  Date Value  05/11/2022 146 (H)  06/30/2014 135 (L)     Assessment: 56 y.o. female with a history of diabetes and asthma who presented to the ED on 12/15 unresponsive. Her daughter found her minimally responsive this morning. She has a history of diabetes. Blood sugar was reading high. Patient was started on insulin drip and levophed d/t hyperglycemia and being hypotensive upon arrival. Pharmacy will monitor and replace electrolytes while under PCCM care. -intubated 12/15   Goal of Therapy:  Electrolytes WNL  Plan:  -Potassium and phosphorus now wnl. -Recheck electrolytes 12/17 with AM labs.   Lorin Picket, PharmD Clinical Pharmacist 05/11/2022

## 2022-05-11 NOTE — Consult Note (Addendum)
PHARMACY CONSULT NOTE - FOLLOW UP  Pharmacy Consult for Electrolyte Monitoring and Replacement   Recent Labs: Potassium (mmol/L)  Date Value  05/11/2022 3.2 (L)  06/30/2014 4.6   Magnesium (mg/dL)  Date Value  05/10/2022 2.4   Calcium (mg/dL)  Date Value  05/11/2022 7.8 (L)   Calcium, Total (mg/dL)  Date Value  06/30/2014 9.1   Albumin (g/dL)  Date Value  05/10/2022 3.0 (L)  02/12/2014 3.7   Phosphorus (mg/dL)  Date Value  05/10/2022 9.7 (H)   Sodium (mmol/L)  Date Value  05/11/2022 149 (H)  06/30/2014 135 (L)     Assessment: 56 y.o. female with a history of diabetes and asthma who presented to the ED on 12/15 unresponsive. Her daughter found her minimally responsive this morning. She has a history of diabetes. Blood sugar was reading high. Patient was started on insulin drip and levophed d/t hyperglycemia and being hypotensive upon arrival. Pharmacy will monitor and replace electrolytes while under PCCM care.  IVF: sodium bicarbonate 128mq'@200ml'$ /hr, D5LR'@125ml'$ /hr  Goal of Therapy:  Electrolytes WNL  Plan:  12/16 0157 K+ 3.2 --Ordered KCl 10 mEq x 4  --Will continue to recheck BMP q4h while on insulin drip. Replacing electrolytes as appropriate  NRenda Rolls PharmD, MDeborah [facility] And [facility] Center12/16/2023 2:48 AM

## 2022-05-11 NOTE — Consult Note (Signed)
PHARMACY CONSULT NOTE - FOLLOW UP  Pharmacy Consult for Electrolyte Monitoring and Replacement   Recent Labs: Potassium (mmol/L)  Date Value  05/11/2022 3.1 (L)  06/30/2014 4.6   Magnesium (mg/dL)  Date Value  05/11/2022 1.7   Calcium (mg/dL)  Date Value  05/11/2022 7.0 (L)   Calcium, Total (mg/dL)  Date Value  06/30/2014 9.1   Albumin (g/dL)  Date Value  05/10/2022 3.0 (L)  02/12/2014 3.7   Phosphorus (mg/dL)  Date Value  05/11/2022 2.2 (L)   Sodium (mmol/L)  Date Value  05/11/2022 148 (H)  06/30/2014 135 (L)     Assessment: 56 y.o. female with a history of diabetes and asthma who presented to the ED on 12/15 unresponsive. Her daughter found her minimally responsive this morning. She has a history of diabetes. Blood sugar was reading high. Patient was started on insulin drip and levophed d/t hyperglycemia and being hypotensive upon arrival. Pharmacy will monitor and replace electrolytes while under PCCM care. -intubated 12/15  IVF: sodium bicarbonate 12mq'@200ml'$ /hr, D5LR'@125ml'$ /hr  Goal of Therapy:  Electrolytes WNL  Plan:  12/16 0518 K+ 3.1  Mag 1.7  phos 2.2  Scr 1.30 Ca 7.0  alb 3.0  Corrected Ca 7.8 --Will Order KCl 10 mEq IV central line x 2 --will order KPhos 20 mmol IV x 1 --Will order Magnesium 2 gm IV x1  -- BMP q4h thru 12/16'@1300'$ .  Patient has transitioned off insulin drip.  F/u electrolytes as appropriate  KChinita GreenlandPharmD Clinical Pharmacist 05/11/2022

## 2022-05-12 DIAGNOSIS — E1111 Type 2 diabetes mellitus with ketoacidosis with coma: Secondary | ICD-10-CM | POA: Diagnosis not present

## 2022-05-12 DIAGNOSIS — N179 Acute kidney failure, unspecified: Secondary | ICD-10-CM | POA: Diagnosis not present

## 2022-05-12 DIAGNOSIS — T68XXXA Hypothermia, initial encounter: Secondary | ICD-10-CM | POA: Diagnosis not present

## 2022-05-12 DIAGNOSIS — A419 Sepsis, unspecified organism: Secondary | ICD-10-CM | POA: Diagnosis not present

## 2022-05-12 LAB — BASIC METABOLIC PANEL WITH GFR
Anion gap: 7 (ref 5–15)
BUN: 21 mg/dL — ABNORMAL HIGH (ref 6–20)
CO2: 28 mmol/L (ref 22–32)
Calcium: 7.9 mg/dL — ABNORMAL LOW (ref 8.9–10.3)
Chloride: 113 mmol/L — ABNORMAL HIGH (ref 98–111)
Creatinine, Ser: 1.06 mg/dL — ABNORMAL HIGH (ref 0.44–1.00)
GFR, Estimated: >60 mL/min
Glucose, Bld: 174 mg/dL — ABNORMAL HIGH (ref 70–99)
Potassium: 3.8 mmol/L (ref 3.5–5.1)
Sodium: 148 mmol/L — ABNORMAL HIGH (ref 135–145)

## 2022-05-12 LAB — CBC
HCT: 29.3 % — ABNORMAL LOW (ref 36.0–46.0)
Hemoglobin: 9.5 g/dL — ABNORMAL LOW (ref 12.0–15.0)
MCH: 31.9 pg (ref 26.0–34.0)
MCHC: 32.4 g/dL (ref 30.0–36.0)
MCV: 98.3 fL (ref 80.0–100.0)
Platelets: 135 10*3/uL — ABNORMAL LOW (ref 150–400)
RBC: 2.98 MIL/uL — ABNORMAL LOW (ref 3.87–5.11)
RDW: 12.2 % (ref 11.5–15.5)
WBC: 11.8 10*3/uL — ABNORMAL HIGH (ref 4.0–10.5)
nRBC: 0 % (ref 0.0–0.2)

## 2022-05-12 LAB — BLOOD GAS, ARTERIAL
Acid-Base Excess: 9.1 mmol/L — ABNORMAL HIGH (ref 0.0–2.0)
Bicarbonate: 34.2 mmol/L — ABNORMAL HIGH (ref 20.0–28.0)
O2 Content: 15 L/min
O2 Saturation: 99.8 %
Patient temperature: 37
pCO2 arterial: 47 mmHg (ref 32–48)
pH, Arterial: 7.47 — ABNORMAL HIGH (ref 7.35–7.45)
pO2, Arterial: 189 mmHg — ABNORMAL HIGH (ref 83–108)

## 2022-05-12 LAB — GLUCOSE, CAPILLARY
Glucose-Capillary: 193 mg/dL — ABNORMAL HIGH (ref 70–99)
Glucose-Capillary: 131 mg/dL — ABNORMAL HIGH (ref 70–99)
Glucose-Capillary: 110 mg/dL — ABNORMAL HIGH (ref 70–99)
Glucose-Capillary: 197 mg/dL — ABNORMAL HIGH (ref 70–99)
Glucose-Capillary: 160 mg/dL — ABNORMAL HIGH (ref 70–99)
Glucose-Capillary: 191 mg/dL — ABNORMAL HIGH (ref 70–99)

## 2022-05-12 LAB — PHOSPHORUS: Phosphorus: 2.4 mg/dL — ABNORMAL LOW (ref 2.5–4.6)

## 2022-05-12 LAB — PROCALCITONIN: Procalcitonin: 7 ng/mL

## 2022-05-12 LAB — MAGNESIUM: Magnesium: 2.4 mg/dL (ref 1.7–2.4)

## 2022-05-12 MED ORDER — MORPHINE SULFATE (PF) 2 MG/ML IV SOLN: 2.0000 mg | INTRAVENOUS | Status: DC

## 2022-05-12 MED ORDER — SODIUM CHLORIDE 0.45 % IV SOLN: INTRAVENOUS | Status: DC

## 2022-05-12 MED ORDER — VANCOMYCIN HCL 2000 MG/400ML IV SOLN
2000.0000 mg | INTRAVENOUS | Status: DC
  Filled 2022-05-12: qty 400

## 2022-05-12 MED ORDER — SODIUM CHLORIDE 0.9 % IV SOLN: INTRAVENOUS | Status: AC

## 2022-05-12 MED ORDER — QUETIAPINE FUMARATE 25 MG PO TABS
50.0000 mg | ORAL_TABLET | Freq: Once | ORAL | Status: AC
  Administered 2022-05-12: 50 mg via ORAL
  Filled 2022-05-12: qty 2

## 2022-05-12 MED ORDER — POTASSIUM PHOSPHATES 15 MMOLE/5ML IV SOLN
10.0000 mmol | Freq: Once | INTRAVENOUS | Status: AC
  Administered 2022-05-12: 10 mmol via INTRAVENOUS
  Filled 2022-05-12: qty 3.33

## 2022-05-12 MED ORDER — QUETIAPINE FUMARATE 25 MG PO TABS
25.0000 mg | ORAL_TABLET | Freq: Three times a day (TID) | ORAL | Status: DC
  Administered 2022-05-13 (×3): 25 mg via ORAL
  Filled 2022-05-12 (×4): qty 1

## 2022-05-12 NOTE — Consult Note (Signed)
PHARMACY CONSULT NOTE - FOLLOW UP  Pharmacy Consult for Electrolyte Monitoring and Replacement   Recent Labs: Potassium (mmol/L)  Date Value  05/12/2022 3.8  06/30/2014 4.6   Magnesium (mg/dL)  Date Value  05/12/2022 2.4   Calcium (mg/dL)  Date Value  05/12/2022 7.9 (L)   Calcium, Total (mg/dL)  Date Value  06/30/2014 9.1   Albumin (g/dL)  Date Value  05/10/2022 3.0 (L)  02/12/2014 3.7   Phosphorus (mg/dL)  Date Value  05/12/2022 2.4 (L)   Sodium (mmol/L)  Date Value  05/12/2022 148 (H)  06/30/2014 135 (L)     Assessment: 56 y.o. female with a history of diabetes and asthma who presented to the ED on 12/15 unresponsive. Her daughter found her minimally responsive this morning. She has a history of diabetes. Blood sugar was reading high. Patient was started on insulin drip and levophed d/t hyperglycemia and being hypotensive upon arrival. Pharmacy will monitor and replace electrolytes while under PCCM care. -intubated 12/15   Goal of Therapy:  Electrolytes WNL  Plan:  12/17 '@0459'$   K 3.8  Mag 2.4  Phos 2.4  Scr 1.06 -Will order IV KPhos 10 mmol IV x1 -f/u electrolytes with AM labs.   Noralee Space, PharmD Clinical Pharmacist 05/12/2022

## 2022-05-12 NOTE — Consult Note (Signed)
Pharmacy Antibiotic Note  Tanya Graves is a 56 y.o. female admitted on 05/10/2022 with sepsis. Pharmacy has been consulted for Vancomycin and Cefepime dosing.  Scr improved to 1.26>>1.06 -?unclear source,  MRSA PCR negative  Plan: -Scr improved Will adjust Vancomycin from 1750 mg to '2000mg'$  IV Q 24 hrs. Goal AUC 400-550. Expected AUC: 501 Cmin 11.3 SCr used: 1.06   -continue Cefepime 2g IV Q8h for Crcl 74.7 ml/min  -f/u source of sepsis  Will continue to monitor, adjust, and de-escalate antibiotics as appropriate   Height: '5\' 8"'$  (172.7 cm) Weight: 101.5 kg (223 lb 12.3 oz) IBW/kg (Calculated) : 63.9  Temp (24hrs), Avg:99.3 F (37.4 C), Min:97.3 F (36.3 C), Max:101.3 F (38.5 C)  Recent Labs  Lab 05/10/22 0806 05/10/22 0807 05/10/22 0928 05/10/22 1148 05/10/22 1158 05/10/22 1342 05/10/22 1546 05/10/22 2020 05/11/22 0157 05/11/22 0518 05/11/22 0856 05/11/22 1329 05/11/22 1745 05/12/22 0459  WBC 32.7*  --   --   --  40.7*  --   --   --   --   --  19.4*  --   --  11.8*  CREATININE  --   --    < > 2.28*  --    < >  --  1.81*   < > 1.30* 1.26* 1.35* 1.16* 1.06*  LATICACIDVEN  --  6.6*  --  7.1*  --   --  4.5* 4.7*  --   --   --   --   --   --   VANCORANDOM  --   --   --   --   --   --   --   --   --   --  9  --   --   --    < > = values in this interval not displayed.     Estimated Creatinine Clearance: 74.7 mL/min (A) (by C-G formula based on SCr of 1.06 mg/dL (H)).    No Known Allergies  Antimicrobials this admission: Vancomycin 12/15 >>  Cefepime 12/15 >>  Flagyl 12/15 x 1  Dose adjustments this admission: 12/17  vanc '1750mg'$  q24h to '2000mg'$  q24h  Microbiology results: 12/15 [id number] 12/15 MRSA PCR neg 12/16 CXR neg   Thank you for allowing pharmacy to be a part of this patient's care.  Sallee Hogrefe A 05/12/2022 8:33 AM

## 2022-05-12 NOTE — Progress Notes (Addendum)
Pulmonary and Critical care    NAME:  Tanya Graves, MRN:  [id number], DOB:  05-13-1966, LOS: 2 ADMISSION DATE:  05/10/2022,     History of Present Illness:  History is obtained from the patient medical chart.  The patient arrived by ambulance December 15, she was found unresponsive by the daughter.  Initial blood pressure was 90/27 heart rate 110 respiration 30.  Patient was placed on a nonrebreather by EMS. Patient remained unresponsive in the emergency room.  She was found to have profound diabetic ketoacidosis in the ER with initial labs showing potassium of 7.4 serum bicarbonate less than 7 glucose 1070 creatinine 2.12.  Initial blood gas showed pH of about 6.8-6.9.  This was venous.  The patient was given about 4 L of LR in the ER sequentially but remained hypotensive.  Subsequently started on Levophed emergent triple-lumen femoral TLC was obtained.  Patient was also started on insulin drip and bicarbonate drip however remained in DKA with pH less than 7 venous pH, patient also was eloping respiratory distress and was subsequently intubated in the ER.  Additional 2 to 3 L fluid bolus was given afterwards.     Patient recently had sinus and ear infection but did not take antibiotics due to nausea vomiting.  It appears that the patient has a 34 year old daughter that found her in the ER the neighbor is present which is able to provide minimal history.  I have spoken to siblings over the phone and given update. December 16.  The patient has been now off insulin drip.  BMP improved.  The patient was on spontaneous breathing trial off sedation she was extubated she has been delirious.  Postextubation requiring Precedex.  Has been afebrile and hemodynamically stable not requiring any vasopressors. December 17.  Not requiring any vasopressors.  Still requiring Precedex.  Mental status slowly improving.  Mild hyponatremia.  Patient still continues to moan however has more clear words today nonspecific.   Asking for help very minimally redirectable, leukocytosis improving.  Elevated procalcitonin however improved., creatinine improving.  Febrile              05/12/2022    8:00 AM 05/12/2022    6:00 AM 05/12/2022    5:30 AM  Vitals with BMI  Pulse 64 63 66        Examination:  General: Delirious postextubation.  Agitated moving all extremities vigorously and spontaneously moaning out loud  HENT: No JVD no icterus Lungs: Air entry was heard bilaterally no wheezing or crackles Cardiovascular: Heart S1-S2 no murmurs no parasternal heave Abdomen: Obese nontender Extremities: No edema or cyanosis Neuro: Moving all extremities spontaneously   Assessment & Plan:  Profound metabolic encephalopathy Diabetic ketoacidosis improved  Lactic acidosis improved  Septic shock , ? UTI urine was very purulent appearing and cloudy however culture is not  supportive   5. acute renal failure improved   6.  Hypernatremia . plan  Start half-normal saline  persistent metabolic encephalopathy requiring Precedex.  Start Seroquel try to wean off Precedex Continue antibiotics , improving procalcitonin improving WBC count however still has fever. discontinue vasopressors. Long-acting insulin along with sliding scale Spoke to brother If patient is more awake and directable diet can be started. [place]cal tomorrow  Best Practice (right click and "Reselect all SmartList Selections" daily)   Diet/type: NPO can have meds with sips of water DVT prophylaxis: LMWH GI prophylaxis: PPI Lines: Central line Foley:  Yes, and it is still needed Code Status:  full  code  Labs   CBC: Recent Labs  Lab 05/10/22 0806 05/10/22 1158 05/11/22 0856 05/12/22 0459  WBC 32.7* 40.7* 19.4* 11.8*  NEUTROABS 25.4* 34.2*  --   --   HGB 12.4 11.3* 10.4* 9.5*  HCT 43.8 38.4 29.8* 29.3*  MCV 115.6* 112.0* 92.0 98.3  PLT 361 304 227 135*    Basic Metabolic Panel: Recent Labs  Lab 05/10/22 0928 05/10/22 1148  05/10/22 1342 05/11/22 0518 05/11/22 0856 05/11/22 1329 05/11/22 1745 05/12/22 0459  NA 135 135   < > 148* 148* 146* 146* 148*  K 7.4* >7.5*   < > 3.1* 3.5 3.9 4.1 3.8  CL 102 98   < > 113* 111 109 109 113*  CO2 <7* <7*   < > '25 26 27 28 28  '$ GLUCOSE 1,070* 1,135*   < > 135* 163* 208* 216* 174*  BUN 27* 29*   < > 25* 27* 25* 24* 21*  CREATININE 2.12* 2.28*   < > 1.30* 1.26* 1.35* 1.16* 1.06*  CALCIUM 7.9* 7.9*   < > 7.0* 7.8* 7.7* 7.6* 7.9*  MG 2.6* 2.4  --  1.7  --   --   --  2.4  PHOS 10.0* 9.7*  --  2.2*  --   --  3.3 2.4*   < > = values in this interval not displayed.   GFR: Estimated Creatinine Clearance: 74.7 mL/min (A) (by C-G formula based on SCr of 1.06 mg/dL (H)). Recent Labs  Lab 05/10/22 0806 05/10/22 0807 05/10/22 1148 05/10/22 1158 05/10/22 1546 05/10/22 2020 05/11/22 0518 05/11/22 0856 05/12/22 0459  PROCALCITON  --   --  7.22  --   --   --  12.26  --  7.00  WBC 32.7*  --   --  40.7*  --   --   --  19.4* 11.8*  LATICACIDVEN  --  6.6* 7.1*  --  4.5* 4.7*  --   --   --     Liver Function Tests: Recent Labs  Lab 05/10/22 0928 05/10/22 1148 05/10/22 1342  AST 32 33 38  ALT '15 20 21  '$ ALKPHOS 92 89 100  BILITOT 2.1* 2.9* 2.8*  PROT 5.5* 5.5* 5.8*  ALBUMIN 2.9* 2.8* 3.0*   Recent Labs  Lab 05/10/22 0806 05/10/22 1148  LIPASE 23 85*  AMYLASE  --  50   No results for input(s): "AMMONIA" in the last 168 hours.  ABG    Component Value Date/Time   PHART 7.46 (H) 05/10/2022 2033   PCO2ART 27 (L) 05/10/2022 2033   PO2ART 172 (H) 05/10/2022 2033   HCO3 19.2 (L) 05/10/2022 2033   TCO2 6 (L) 05/29/2018 2041   ACIDBASEDEF 3.2 (H) 05/10/2022 2033   O2SAT 99.8 05/10/2022 2033     Coagulation Profile: No results for input(s): "INR", "PROTIME" in the last 168 hours.  Cardiac Enzymes: No results for input(s): "CKTOTAL", "CKMB", "CKMBINDEX", "TROPONINI" in the last 168 hours.  HbA1C: Hemoglobin A1C  Date/Time Value Ref Range Status  08/15/2021  02:49 PM 11.2 (A) 4.0 - 5.6 % Final  04/16/2021 03:39 PM 10.8 (A) 4.0 - 5.6 % Final  06/07/2016 12:00 AM 12.9  Final   Hgb A1c MFr Bld  Date/Time Value Ref Range Status  02/14/2022 04:00 PM 12.7 (H) 4.8 - 5.6 % Final    Comment:             Prediabetes: 5.7 - 6.4          Diabetes: >  6.4          Glycemic control for adults with diabetes: <7.0   03/25/2019 04:35 PM 11.2 (H) 4.6 - 6.5 % Final    Comment:    Glycemic Control Guidelines for People with Diabetes:Non Diabetic:  <6%Goal of Therapy: <7%Additional Action Suggested:  >8%     CBG: Recent Labs  Lab 05/11/22 1600 05/11/22 1932 05/11/22 2304 05/12/22 0314 05/12/22 0734  GLUCAP 187* 204* 233* 193* 131*    RADIOLOGY   Review of Systems:   Unable to do review of systems  Past Medical History:  She,  has a past medical history of Arthritis, Diabetes mellitus without complication ([place]), and Thyroid disease.   Surgical History:   Past Surgical History:  Procedure Laterality Date   APPENDECTOMY  1993   CALCANEAL OSTEOTOMY Left 07/17/2016   Procedure: CALCANEAL OSTEOTOMY;  Surgeon: [name], MD;  Location: [facility] ORS;  Service: Orthopedics;  Laterality: Left;   COLONOSCOPY  12/18/2018   found 1 44m polyp, going back in 5 years    COLONOSCOPY WITH PROPOFOL N/A 12/18/2018   Procedure: COLONOSCOPY WITH PROPOFOL;  Surgeon: AJonathon Bellows MD;  [facility];  Service: Gastroenterology;  Laterality: N/A;   IRRIGATION AND DEBRIDEMENT ABSCESS N/A 01/24/2015   Procedure: IRRIGATION AND DEBRIDEMENT ABSCESS/GROIN ABSCESS;  Surgeon: CMarlyce Huge MD;  Location: [facility] ORS;  Service: General;  Laterality: N/A;   KNEE ARTHROSCOPY W/ MENISCAL REPAIR Right 2008   ROTATOR CUFF REPAIR Right 2015   TONSILLECTOMY  1998     Social History:   reports that she has been smoking cigarettes. She has a 7.50 pack-year smoking history. She has never used smokeless tobacco. She reports current alcohol use. She reports that she  does not use drugs.   Family History:  Her family history includes Alcohol abuse in her maternal grandmother; Arthritis in her mother; Breast cancer in her maternal great-grandmother; Cancer in her paternal grandfather; Diabetes in her brother, father, mother, and paternal grandmother; Heart failure in her father, maternal grandmother, and paternal grandfather; Hypertension in her brother; Kidney failure in her paternal grandmother; Transient ischemic attack in her mother.   Allergies No Known Allergies   Home Medications  Prior to Admission medications   Medication Sig Start Date End Date Taking? Authorizing Provider  albuterol (VENTOLIN HFA) 108 (90 Base) MCG/ACT inhaler Inhale 2 puffs into the lungs every 6 (six) hours as needed for wheezing or shortness of breath. 04/05/22  Yes CMichela Pitcher NP  amoxicillin-clavulanate (AUGMENTIN) 875-125 MG tablet Take 1 tablet by mouth 2 (two) times daily. 12/12-12/22 05/07/22 05/17/22 Yes [provider]  atorvastatin (LIPITOR) 20 MG tablet TAKE ONE TABLET BY MOUTH DAILY 03/11/22  Yes Shamleffer, IMelanie Crazier MD  estradiol (ESTRACE) 0.1 MG/GM vaginal cream Place 1 Applicatorful vaginally at bedtime.   Yes EHarlin Heys MD  insulin aspart (NOVOLOG) 100 UNIT/ML injection INJECT A MAXIMUM OF 60 UNITS UNDER THE SKIN DAILY 04/15/22  Yes Shamleffer, IMelanie Crazier MD  levothyroxine (SYNTHROID) 175 MCG tablet Take 1 tablet (175 mcg total) by mouth daily with breakfast. 02/26/22  Yes Shamleffer, IMelanie Crazier MD  meloxicam (MOBIC) 15 MG tablet TAKE 1 TABLET BY MOUTH DAILY 04/16/22  Yes EEdrick Kins DPM  tirzepatide ([facility] 7.5 MG/0.5ML Pen Inject 7.5 mg into the skin once a week. 02/25/22  Yes Shamleffer, IMelanie Crazier MD  insulin glargine, 1 Unit Dial, (TOUJEO SOLOSTAR) 300 UNIT/ML Solostar Pen Inject 24 Units into the skin daily.    [provider]  nystatin cream (MYCOSTATIN) Apply 1 Application topically 2 (two) times  daily. Patient not taking: Reported on 05/10/2022 03/26/22   [name], MD     Critical care time:  I have personally spent _____40_____ minutes of critical care time, exclusive of time spent on any  procedures, in evaluation and management of this critically ill patient's condition.    Terrell Ostrand MD ([facility]) FOR [place] [facility]

## 2022-05-13 ENCOUNTER — Inpatient Hospital Stay: Payer: BC Managed Care – PPO

## 2022-05-13 DIAGNOSIS — E1111 Type 2 diabetes mellitus with ketoacidosis with coma: Secondary | ICD-10-CM | POA: Diagnosis not present

## 2022-05-13 DIAGNOSIS — A419 Sepsis, unspecified organism: Secondary | ICD-10-CM | POA: Diagnosis not present

## 2022-05-13 DIAGNOSIS — T68XXXA Hypothermia, initial encounter: Secondary | ICD-10-CM | POA: Diagnosis not present

## 2022-05-13 DIAGNOSIS — N179 Acute kidney failure, unspecified: Secondary | ICD-10-CM | POA: Diagnosis not present

## 2022-05-13 DIAGNOSIS — S42202A Unspecified fracture of upper end of left humerus, initial encounter for closed fracture: Secondary | ICD-10-CM

## 2022-05-13 LAB — PHOSPHORUS: Phosphorus: 1.9 mg/dL — ABNORMAL LOW (ref 2.5–4.6)

## 2022-05-13 LAB — BASIC METABOLIC PANEL
Anion gap: 5 (ref 5–15)
BUN: 17 mg/dL (ref 6–20)
CO2: 25 mmol/L (ref 22–32)
Calcium: 8 mg/dL — ABNORMAL LOW (ref 8.9–10.3)
Chloride: 113 mmol/L — ABNORMAL HIGH (ref 98–111)
Creatinine, Ser: 0.8 mg/dL (ref 0.44–1.00)
GFR, Estimated: >60 mL/min (ref >60)
Glucose, Bld: 197 mg/dL — ABNORMAL HIGH (ref 70–99)
Potassium: 4.1 mmol/L (ref 3.5–5.1)
Sodium: 143 mmol/L (ref 135–145)

## 2022-05-13 LAB — GLUCOSE, CAPILLARY
Glucose-Capillary: 176 mg/dL — ABNORMAL HIGH (ref 70–99)
Glucose-Capillary: 131 mg/dL — ABNORMAL HIGH (ref 70–99)
Glucose-Capillary: 149 mg/dL — ABNORMAL HIGH (ref 70–99)
Glucose-Capillary: 123 mg/dL — ABNORMAL HIGH (ref 70–99)
Glucose-Capillary: 121 mg/dL — ABNORMAL HIGH (ref 70–99)
Glucose-Capillary: 118 mg/dL — ABNORMAL HIGH (ref 70–99)

## 2022-05-13 LAB — CBC
HCT: 32.5 % — ABNORMAL LOW (ref 36.0–46.0)
Hemoglobin: 10.6 g/dL — ABNORMAL LOW (ref 12.0–15.0)
MCH: 32.1 pg (ref 26.0–34.0)
MCHC: 32.6 g/dL (ref 30.0–36.0)
MCV: 98.5 fL (ref 80.0–100.0)
Platelets: 105 10*3/uL — ABNORMAL LOW (ref 150–400)
RBC: 3.3 MIL/uL — ABNORMAL LOW (ref 3.87–5.11)
RDW: 12.1 % (ref 11.5–15.5)
WBC: 10.9 10*3/uL — ABNORMAL HIGH (ref 4.0–10.5)
nRBC: 0 % (ref 0.0–0.2)

## 2022-05-13 LAB — PROCALCITONIN: Procalcitonin: 2.58 ng/mL

## 2022-05-13 MED ORDER — ENOXAPARIN SODIUM 60 MG/0.6ML IJ SOSY
0.5000 mg/kg | PREFILLED_SYRINGE | INTRAMUSCULAR | Status: DC
  Administered 2022-05-13 – 2022-05-14 (×2): 50 mg via SUBCUTANEOUS
  Filled 2022-05-13 (×3): qty 0.6

## 2022-05-13 MED ORDER — LORAZEPAM 2 MG/ML IJ SOLN: 1.0000 mg | Freq: Once | INTRAMUSCULAR | Status: DC

## 2022-05-13 MED ORDER — HALOPERIDOL LACTATE 5 MG/ML IJ SOLN
2.5000 mg | Freq: Four times a day (QID) | INTRAMUSCULAR | Status: DC | PRN
  Administered 2022-05-13: 2.5 mg via INTRAVENOUS
  Filled 2022-05-13: qty 1

## 2022-05-13 MED ORDER — ATORVASTATIN CALCIUM 20 MG PO TABS
80.0000 mg | ORAL_TABLET | Freq: Every day | ORAL | Status: DC
  Administered 2022-05-13 – 2022-05-15 (×3): 80 mg via ORAL
  Filled 2022-05-13 (×3): qty 4

## 2022-05-13 MED ORDER — INSULIN ASPART 100 UNIT/ML IJ SOLN
0.0000 [IU] | Freq: Three times a day (TID) | INTRAMUSCULAR | Status: DC
  Administered 2022-05-14: 8 [IU] via SUBCUTANEOUS
  Administered 2022-05-14: 3 [IU] via SUBCUTANEOUS
  Administered 2022-05-15: 2 [IU] via SUBCUTANEOUS
  Administered 2022-05-15: 3 [IU] via SUBCUTANEOUS
  Filled 2022-05-13 (×3): qty 1

## 2022-05-13 MED ORDER — THIAMINE MONONITRATE 100 MG PO TABS
100.0000 mg | ORAL_TABLET | Freq: Every day | ORAL | Status: DC
  Administered 2022-05-14 – 2022-05-15 (×2): 100 mg via ORAL
  Filled 2022-05-13 (×3): qty 1

## 2022-05-13 MED ORDER — INSULIN ASPART 100 UNIT/ML IJ SOLN
0.0000 [IU] | Freq: Every day | INTRAMUSCULAR | Status: DC
  Administered 2022-05-14: 2 [IU] via SUBCUTANEOUS
  Filled 2022-05-13: qty 1

## 2022-05-13 MED ORDER — LEVOTHYROXINE SODIUM 50 MCG PO TABS
175.0000 ug | ORAL_TABLET | Freq: Every day | ORAL | Status: DC
  Administered 2022-05-14 – 2022-05-15 (×2): 175 ug via ORAL
  Filled 2022-05-13 (×2): qty 2

## 2022-05-13 MED ORDER — PANTOPRAZOLE SODIUM 40 MG PO TBEC
40.0000 mg | DELAYED_RELEASE_TABLET | Freq: Every day | ORAL | Status: DC
  Administered 2022-05-14 – 2022-05-15 (×2): 40 mg via ORAL
  Filled 2022-05-13 (×2): qty 1

## 2022-05-13 MED ORDER — CEFAZOLIN SODIUM-DEXTROSE 1-4 GM/50ML-% IV SOLN
1.0000 g | Freq: Three times a day (TID) | INTRAVENOUS | Status: DC
  Administered 2022-05-13 – 2022-05-14 (×3): 1 g via INTRAVENOUS
  Filled 2022-05-13 (×4): qty 50

## 2022-05-13 MED ORDER — POLYETHYLENE GLYCOL 3350 17 G PO PACK
17.0000 g | PACK | Freq: Every day | ORAL | Status: DC
  Administered 2022-05-14 – 2022-05-15 (×2): 17 g via ORAL
  Filled 2022-05-13 (×2): qty 1

## 2022-05-13 MED ORDER — ADULT MULTIVITAMIN W/MINERALS CH
1.0000 | ORAL_TABLET | Freq: Every day | ORAL | Status: DC
  Administered 2022-05-14 – 2022-05-15 (×2): 1 via ORAL
  Filled 2022-05-13 (×3): qty 1

## 2022-05-13 MED ORDER — LORAZEPAM 2 MG/ML IJ SOLN
1.0000 mg | Freq: Once | INTRAMUSCULAR | Status: DC
  Filled 2022-05-13: qty 1

## 2022-05-13 MED ORDER — FOLIC ACID 1 MG PO TABS
1.0000 mg | ORAL_TABLET | Freq: Every day | ORAL | Status: DC
  Administered 2022-05-13 – 2022-05-15 (×3): 1 mg via ORAL
  Filled 2022-05-13 (×3): qty 1

## 2022-05-13 MED ORDER — ASPIRIN 81 MG PO CHEW
81.0000 mg | CHEWABLE_TABLET | Freq: Every day | ORAL | Status: DC
  Administered 2022-05-13 – 2022-05-15 (×3): 81 mg via ORAL
  Filled 2022-05-13 (×3): qty 1

## 2022-05-13 MED ORDER — SODIUM PHOSPHATES 45 MMOLE/15ML IV SOLN
30.0000 mmol | Freq: Once | INTRAVENOUS | Status: AC
  Administered 2022-05-13: 30 mmol via INTRAVENOUS
  Filled 2022-05-13: qty 10

## 2022-05-13 MED ORDER — DOCUSATE SODIUM 100 MG PO CAPS
100.0000 mg | ORAL_CAPSULE | Freq: Two times a day (BID) | ORAL | Status: DC
  Administered 2022-05-13 – 2022-05-15 (×4): 100 mg via ORAL
  Filled 2022-05-13 (×5): qty 1

## 2022-05-13 NOTE — TOC CM/SW Note (Signed)
  Transition of Care [facility]) Screening Note   Patient Details  Name: Tanya Graves Date of Birth: 07-16-1965   Transition of Care [facility]) CM/SW Contact:    Candie Chroman, LCSW Phone Number: 05/13/2022, 1:30 PM    Transition of Care Department [facility]) has reviewed patient and no TOC needs have been identified at this time. We will continue to monitor patient advancement through interdisciplinary progression rounds. If new patient transition needs arise, please place a TOC consult.

## 2022-05-13 NOTE — Progress Notes (Addendum)
Pulmonary and Critical care    NAME:  Tanya Graves, MRN:  [id number], DOB:  03/15/1966, LOS: 3 ADMISSION DATE:  05/10/2022,     History of Present Illness:   History is obtained from the patient medical chart.  The patient arrived by ambulance December 15, she was found unresponsive by the daughter.  Initial blood pressure was 90/27 heart rate 110 respiration 30.  Patient was placed on a nonrebreather by EMS. Patient remained unresponsive in the emergency room.  She was found to have profound diabetic ketoacidosis in the ER with initial labs showing potassium of 7.4 serum bicarbonate less than 7 glucose 1070 creatinine 2.12.  Initial blood gas showed pH of about 6.8-6.9.  This was venous.  The patient was given about 4 L of LR in the ER sequentially but remained hypotensive.  Subsequently started on Levophed emergent triple-lumen femoral TLC was obtained.  Patient was also started on insulin drip and bicarbonate drip however remained in DKA with pH less than 7 venous pH, patient also was eloping respiratory distress and was subsequently intubated in the ER.  Additional 2 to 3 L fluid bolus was given afterwards.     Patient recently had sinus and ear infection but did not take antibiotics due to nausea vomiting.  It appears that the patient has a 50 year old daughter that found her in the ER the neighbor is present which is able to provide minimal history.  I have spoken to siblings over the phone and given update. December 16.  The patient has been now off insulin drip.  BMP improved.  The patient was on spontaneous breathing trial off sedation she was extubated she has been delirious.  Postextubation requiring Precedex.  Has been afebrile and hemodynamically stable not requiring any vasopressors. December 17.  Not requiring any vasopressors.  Still requiring Precedex.  Mental status slowly improving.  Mild hyponatremia.  Patient still continues to moan however has more clear words today  nonspecific.  Asking for help very minimally redirectable, leukocytosis improving.   Elevated procalcitonin however improved., creatinine improving.   Febrile  December 18.  The patient has improvement in mental status still on Precedex.  However following commands and able to answer simple questions.  Less agitation.  Discussed with staff to completely stop Precedex.  Will start Seroquel.  She has improvement leukocytosis and procalcitonin.  Cultures remain negative.  Cannot obtain any further history from the patient            05/13/2022    8:00 AM 05/13/2022    7:00 AM 05/13/2022    6:00 AM  Vitals with BMI  Systolic 309 407 680  Diastolic 89 91 86  Pulse 66 66 66        Examination:  General: Calm and more cooperative.   Spontaneously moaning out loud  HENT: No JVD no icterus Lungs: Air entry was heard bilaterally no wheezing or crackles Cardiovascular: Heart S1-S2 no murmurs no parasternal heave Abdomen: Obese nontender Extremities: No edema or cyanosis Neuro: Moves extremities to commands today.   Assessment & Plan:  Profound metabolic encephalopathy Diabetic ketoacidosis improved  Lactic acidosis improved  Septic shock , ? UTI urine was very purulent appearing and cloudy however culture is not  supportive   5. acute renal failure improved   6.  Hypernatremia  improving  plan  Continue half-normal saline at can be discontinued if patient has improving p.o. intake. 2.  Discontinue Precedex.   3.  Continue Seroquel. 4.  Will  try to obtain more history from family about any potential alcohol use. 5.  De-escalate antibiotics .  Will be transition from cefepime to Ancef 6.  Remove femoral line as well as arterial line . 7. long-acting insulin along with sliding scale 8.  Folic acid and thiamine 9.  Nurse eval for swallowing off Precedex. 10 Obesity   Best Practice (right click and "Reselect all SmartList Selections" daily)  Diet/type: NPO can have meds with  sips of water can be advanced if does well with nursing swallow eval. DVT prophylaxis: LMWH GI prophylaxis: PPI Lines: Remove central line and arterial line  Foley: Try to remove Foley catheter later today  code Status:  full code  Labs   CBC: Recent Labs  Lab 05/10/22 0806 05/10/22 1158 05/11/22 0856 05/12/22 0459 05/13/22 0418  WBC 32.7* 40.7* 19.4* 11.8* 10.9*  NEUTROABS 25.4* 34.2*  --   --   --   HGB 12.4 11.3* 10.4* 9.5* 10.6*  HCT 43.8 38.4 29.8* 29.3* 32.5*  MCV 115.6* 112.0* 92.0 98.3 98.5  PLT 361 304 227 135* 105*    Basic Metabolic Panel: Recent Labs  Lab 05/10/22 0928 05/10/22 1148 05/10/22 1342 05/11/22 0518 05/11/22 0856 05/11/22 1329 05/11/22 1745 05/12/22 0459 05/13/22 0418  NA 135 135   < > 148* 148* 146* 146* 148* 143  K 7.4* >7.5*   < > 3.1* 3.5 3.9 4.1 3.8 4.1  CL 102 98   < > 113* 111 109 109 113* 113*  CO2 <7* <7*   < > '25 26 27 28 28 25  '$ GLUCOSE 1,070* 1,135*   < > 135* 163* 208* 216* 174* 197*  BUN 27* 29*   < > 25* 27* 25* 24* 21* 17  CREATININE 2.12* 2.28*   < > 1.30* 1.26* 1.35* 1.16* 1.06* 0.80  CALCIUM 7.9* 7.9*   < > 7.0* 7.8* 7.7* 7.6* 7.9* 8.0*  MG 2.6* 2.4  --  1.7  --   --   --  2.4  --   PHOS 10.0* 9.7*  --  2.2*  --   --  3.3 2.4* 1.9*   < > = values in this interval not displayed.   GFR: Estimated Creatinine Clearance: 99 mL/min (by C-G formula based on SCr of 0.8 mg/dL). Recent Labs  Lab 05/10/22 0807 05/10/22 1148 05/10/22 1158 05/10/22 1546 05/10/22 2020 05/11/22 0518 05/11/22 0856 05/12/22 0459 05/13/22 0418  PROCALCITON  --  7.22  --   --   --  12.26  --  7.00 2.58  WBC  --   --  40.7*  --   --   --  19.4* 11.8* 10.9*  LATICACIDVEN 6.6* 7.1*  --  4.5* 4.7*  --   --   --   --     Liver Function Tests: Recent Labs  Lab 05/10/22 0928 05/10/22 1148 05/10/22 1342  AST 32 33 38  ALT '15 20 21  '$ ALKPHOS 92 89 100  BILITOT 2.1* 2.9* 2.8*  PROT 5.5* 5.5* 5.8*  ALBUMIN 2.9* 2.8* 3.0*   Recent Labs  Lab  05/10/22 0806 05/10/22 1148  LIPASE 23 85*  AMYLASE  --  50   No results for input(s): "AMMONIA" in the last 168 hours.  ABG    Component Value Date/Time   PHART 7.47 (H) 05/12/2022 1630   PCO2ART 47 05/12/2022 1630   PO2ART 189 (H) 05/12/2022 1630   HCO3 34.2 (H) 05/12/2022 1630   TCO2 6 (L) 05/29/2018 2041  ACIDBASEDEF 3.2 (H) 05/10/2022 2033   O2SAT 99.8 05/12/2022 1630     Coagulation Profile: No results for input(s): "INR", "PROTIME" in the last 168 hours.  Cardiac Enzymes: No results for input(s): "CKTOTAL", "CKMB", "CKMBINDEX", "TROPONINI" in the last 168 hours.  HbA1C: Hemoglobin A1C  Date/Time Value Ref Range Status  08/15/2021 02:49 PM 11.2 (A) 4.0 - 5.6 % Final  04/16/2021 03:39 PM 10.8 (A) 4.0 - 5.6 % Final  06/07/2016 12:00 AM 12.9  Final   Hgb A1c MFr Bld  Date/Time Value Ref Range Status  02/14/2022 04:00 PM 12.7 (H) 4.8 - 5.6 % Final    Comment:             Prediabetes: 5.7 - 6.4          Diabetes: >6.4          Glycemic control for adults with diabetes: <7.0   03/25/2019 04:35 PM 11.2 (H) 4.6 - 6.5 % Final    Comment:    Glycemic Control Guidelines for People with Diabetes:Non Diabetic:  <6%Goal of Therapy: <7%Additional Action Suggested:  >8%     CBG: Recent Labs  Lab 05/12/22 1524 05/12/22 1909 05/12/22 2305 05/13/22 0325 05/13/22 0720  GLUCAP 197* 160* 191* 176* 131*     Review of Systems:   Obtaining full history is difficult denies pain nausea.  Past Medical History:  She,  has a past medical history of Arthritis, Diabetes mellitus without complication ([place]), and Thyroid disease.   Surgical History:   Past Surgical History:  Procedure Laterality Date   APPENDECTOMY  1993   CALCANEAL OSTEOTOMY Left 07/17/2016   Procedure: CALCANEAL OSTEOTOMY;  Surgeon: [name], MD;  Location: [facility] ORS;  Service: Orthopedics;  Laterality: Left;   COLONOSCOPY  12/18/2018   found 1 74m polyp, going back in 5 years    COLONOSCOPY WITH  PROPOFOL N/A 12/18/2018   Procedure: COLONOSCOPY WITH PROPOFOL;  Surgeon: AJonathon Bellows MD;  [facility];  Service: Gastroenterology;  Laterality: N/A;   IRRIGATION AND DEBRIDEMENT ABSCESS N/A 01/24/2015   Procedure: IRRIGATION AND DEBRIDEMENT ABSCESS/GROIN ABSCESS;  Surgeon: CMarlyce Huge MD;  Location: [facility] ORS;  Service: General;  Laterality: N/A;   KNEE ARTHROSCOPY W/ MENISCAL REPAIR Right 2008   ROTATOR CUFF REPAIR Right 2015   TONSILLECTOMY  1998     Social History:   reports that she has been smoking cigarettes. She has a 7.50 pack-year smoking history. She has never used smokeless tobacco. She reports current alcohol use. She reports that she does not use drugs.   Family History:  Her family history includes Alcohol abuse in her maternal grandmother; Arthritis in her mother; Breast cancer in her maternal great-grandmother; Cancer in her paternal grandfather; Diabetes in her brother, father, mother, and paternal grandmother; Heart failure in her father, maternal grandmother, and paternal grandfather; Hypertension in her brother; Kidney failure in her paternal grandmother; Transient ischemic attack in her mother.   Allergies No Known Allergies   Home Medications  Prior to Admission medications   Medication Sig Start Date End Date Taking? Authorizing Provider  albuterol (VENTOLIN HFA) 108 (90 Base) MCG/ACT inhaler Inhale 2 puffs into the lungs every 6 (six) hours as needed for wheezing or shortness of breath. 04/05/22  Yes CMichela Pitcher NP  amoxicillin-clavulanate (AUGMENTIN) 875-125 MG tablet Take 1 tablet by mouth 2 (two) times daily. 12/12-12/22 05/07/22 05/17/22 Yes [provider]  atorvastatin (LIPITOR) 20 MG tablet TAKE ONE TABLET BY MOUTH DAILY 03/11/22  Yes Shamleffer,  Melanie Crazier, MD  estradiol (ESTRACE) 0.1 MG/GM vaginal cream Place 1 Applicatorful vaginally at bedtime.   Yes Harlin Heys, MD  insulin aspart (NOVOLOG) 100 UNIT/ML  injection INJECT A MAXIMUM OF 60 UNITS UNDER THE SKIN DAILY 04/15/22  Yes Shamleffer, Melanie Crazier, MD  levothyroxine (SYNTHROID) 175 MCG tablet Take 1 tablet (175 mcg total) by mouth daily with breakfast. 02/26/22  Yes Shamleffer, Melanie Crazier, MD  meloxicam (MOBIC) 15 MG tablet TAKE 1 TABLET BY MOUTH DAILY 04/16/22  Yes Edrick Kins, DPM  tirzepatide [facility]) 7.5 MG/0.5ML Pen Inject 7.5 mg into the skin once a week. 02/25/22  Yes Shamleffer, Melanie Crazier, MD  insulin glargine, 1 Unit Dial, (TOUJEO SOLOSTAR) 300 UNIT/ML Solostar Pen Inject 24 Units into the skin daily.    [provider]  nystatin cream (MYCOSTATIN) Apply 1 Application topically 2 (two) times daily. Patient not taking: Reported on 05/10/2022 03/26/22   [name], MD     Critical care time:  I have personally spent _____40_____ minutes of critical care time, exclusive of time spent on any  procedures, in evaluation and management of this critically ill patient's condition.    Peytan Andringa MD ([facility]) FOR [place] [facility]

## 2022-05-13 NOTE — Consult Note (Signed)
PHARMACIST - PHYSICIAN COMMUNICATION  CONCERNING:  Enoxaparin (Lovenox) for DVT Prophylaxis    RECOMMENDATION: Patient was prescribed enoxaprin '40mg'$  q24 hours for VTE prophylaxis.   Filed Weights   05/10/22 0915 05/10/22 1732 05/11/22 0500  Weight: 102 kg (224 lb 13.9 oz) 101.6 kg (223 lb 15.8 oz) 101.5 kg (223 lb 12.3 oz)    Body mass index is 34.02 kg/m.  Estimated Creatinine Clearance: 99 mL/min (by C-G formula based on SCr of 0.8 mg/dL).   Based on [place] patient is candidate for enoxaparin 0.'5mg'$ /kg TBW SQ every 24 hours based on BMI being >30.  DESCRIPTION: Pharmacy has adjusted enoxaparin dose per [facility] policy.  Patient is now receiving enoxaparin 50 mg every 24 hours   Gretel Acre, PharmD PGY1 Pharmacy Resident 05/13/2022 8:08 AM

## 2022-05-13 NOTE — Consult Note (Addendum)
[facility] for Electrolyte Monitoring and Replacement   Recent Labs: Potassium (mmol/L)  Date Value  05/13/2022 4.1  06/30/2014 4.6   Magnesium (mg/dL)  Date Value  05/12/2022 2.4   Calcium (mg/dL)  Date Value  05/13/2022 8.0 (L)   Calcium, Total (mg/dL)  Date Value  06/30/2014 9.1   Albumin (g/dL)  Date Value  05/10/2022 3.0 (L)  02/12/2014 3.7   Phosphorus (mg/dL)  Date Value  05/13/2022 1.9 (L)   Sodium (mmol/L)  Date Value  05/13/2022 143  06/30/2014 135 (L)   Assessment: 56 y.o. female with a history of diabetes and asthma who presented to the ED on 12/15 unresponsive. Her daughter found her minimally responsive this morning. She has a history of diabetes. Blood sugar was reading high. Patient was started on insulin drip and levophed d/t hyperglycemia and being hypotensive upon arrival. Pharmacy will monitor and replace electrolytes while under PCCM care.  MIVF: 1/2 NS at 100 cc/hr  Goal of Therapy:  Electrolytes within normal limits  Plan:  --Phos 1.9, sodium phosphate 30 mmol IV x 1 --Follow-up electrolytes with AM labs tomorrow   Benita Gutter 05/13/2022

## 2022-05-13 NOTE — Consult Note (Signed)
ORTHOPAEDIC CONSULTATION  REQUESTING PHYSICIAN: Jeani Hawking, MD  Chief Complaint:   Left proximal humerus fracture  History of Present Illness: Tanya Graves is a 56 y.o. female who was brought in by ambulance on 05/10/2022 after she was found unresponsive.  She was found to be in diabetic ketoacidosis was admitted to the intensive care unit and intubated.  Her mental status has slowly been improving and she was extubated today.  The patient is able to converse on exam, she is unaware of how she got here or how she got injured.  She refers her pain more towards her distal humerus and elbow and into her shoulder.  She is mildly confused on exam but answers questions appropriately and follows directions appropriately.  She denies any numbness or tingling to her hand or digits at this time.  She is right-hand dominant.  Past Medical History:  Diagnosis Date   Arthritis    Diabetes mellitus without complication ([place])    type 2   Thyroid disease    Past Surgical History:  Procedure Laterality Date   APPENDECTOMY  1993   CALCANEAL OSTEOTOMY Left 07/17/2016   Procedure: CALCANEAL OSTEOTOMY;  Surgeon: [name], MD;  Location: [facility] OR[facility];  Service: Orthopedics;  Laterality: Left;   COLONOSCOPY  12/18/2018   found 1 69m polyp, going back in 5 years    COLONOSCOPY WITH PROPOFOL N/A 12/18/2018   Procedure: COLONOSCOPY WITH PROPOFOL;  Surgeon: AJonathon Bellows MD;  [facility];  Service: Gastroenterology;  Laterality: N/A;   IRRIGATION AND DEBRIDEMENT ABSCESS N/A 01/24/2015   Procedure: IRRIGATION AND DEBRIDEMENT ABSCESS/GROIN ABSCESS;  Surgeon: CMarlyce Huge MD;  Location: [facility];  Service: General;  Laterality: N/A;   KNEE ARTHROSCOPY W/ MENISCAL REPAIR Right 2008   ROTATOR CUFF REPAIR Right 2015   TONSILLECTOMY  1998   Social History   Socioeconomic History   Marital status: Single    Spouse name: Not  on file   Number of children: 0   Years of education: Associates   Highest education level: Not on file  Occupational History   Not on file  Tobacco Use   Smoking status: Every Day    Packs/day: 0.25    Years: 30.00    Total pack years: 7.50    Types: Cigarettes   Smokeless tobacco: Never   Tobacco comments:    1 pack a week  Vaping Use   Vaping Use: Former  Substance and Sexual Activity   Alcohol use: Yes    Comment: a couple of times a month, typically less than 2 drinks   Drug use: No   Sexual activity: Not Currently    Birth control/protection: Post-menopausal  Other Topics Concern   Not on file  Social History Narrative   Lives alone   Dog - Sugar   Enjoys: hiking - looking for local trails   Exercise: walking trails with dog   Diet: doing well, did weight watchers, watching carbs   Social Determinants of Health   Financial Resource Strain: Low Risk  (07/22/2018)   Overall Financial Resource Strain (CARDIA)    Difficulty of Paying Living Expenses: Not hard at all  Food Insecurity: No Food Insecurity (01/09/2022)   Hunger Vital Sign    Worried About Running Out of Food in the Last Year: Never true    Ran Out of Food in the Last Year: Never true  Transportation Needs: No Transportation Needs (01/09/2022)   PRAPARE - THydrologist(Medical):  No    Lack of Transportation (Non-Medical): No  Physical Activity: Not on file  Stress: Not on file  Social Connections: Not on file   Family History  Problem Relation Age of Onset   Diabetes Father    Heart failure Father    Diabetes Brother    Arthritis Mother    Diabetes Mother    Transient ischemic attack Mother    Heart failure Maternal Grandmother    Alcohol abuse Maternal Grandmother    Diabetes Paternal Grandmother    Kidney failure Paternal Grandmother    Heart failure Paternal Grandfather    Cancer Paternal Grandfather        unknown type   Hypertension Brother    Breast cancer  Maternal Great-grandmother    No Known Allergies Prior to Admission medications   Medication Sig Start Date End Date Taking? Authorizing Provider  albuterol (VENTOLIN HFA) 108 (90 Base) MCG/ACT inhaler Inhale 2 puffs into the lungs every 6 (six) hours as needed for wheezing or shortness of breath. 04/05/22  Yes Michela Pitcher, NP  amoxicillin-clavulanate (AUGMENTIN) 875-125 MG tablet Take 1 tablet by mouth 2 (two) times daily. 12/12-12/22 05/07/22 05/17/22 Yes [provider]  atorvastatin (LIPITOR) 20 MG tablet TAKE ONE TABLET BY MOUTH DAILY 03/11/22  Yes Shamleffer, Melanie Crazier, MD  estradiol (ESTRACE) 0.1 MG/GM vaginal cream Place 1 Applicatorful vaginally at bedtime.   Yes Harlin Heys, MD  insulin aspart (NOVOLOG) 100 UNIT/ML injection INJECT A MAXIMUM OF 60 UNITS UNDER THE SKIN DAILY 04/15/22  Yes Shamleffer, Melanie Crazier, MD  levothyroxine (SYNTHROID) 175 MCG tablet Take 1 tablet (175 mcg total) by mouth daily with breakfast. 02/26/22  Yes Shamleffer, Melanie Crazier, MD  meloxicam (MOBIC) 15 MG tablet TAKE 1 TABLET BY MOUTH DAILY 04/16/22  Yes Edrick Kins, DPM  tirzepatide [facility]) 7.5 MG/0.5ML Pen Inject 7.5 mg into the skin once a week. 02/25/22  Yes Shamleffer, Melanie Crazier, MD  insulin glargine, 1 Unit Dial, (TOUJEO SOLOSTAR) 300 UNIT/ML Solostar Pen Inject 24 Units into the skin daily.    [provider]  nystatin cream (MYCOSTATIN) Apply 1 Application topically 2 (two) times daily. Patient not taking: Reported on 05/10/2022 03/26/22   [name], MD   DG Elbow 2 Views Left  Result Date: 05/13/2022 CLINICAL DATA:  Swelling. Possible fall prior to coming to this facility. EXAM: LEFT ELBOW - 2 VIEW COMPARISON:  None Available. FINDINGS: Normal position of the distal anterior humeral fat pad without evidence of elbow joint effusion. Joint spaces are preserved. No acute fracture is seen. No dislocation. IMPRESSION: No acute fracture.  Electronically Signed   By: Yvonne Kendall M.D.   On: 05/13/2022 16:02   DG Shoulder Left  Result Date: 05/13/2022 CLINICAL DATA:  Left shoulder and elbow swelling.  Possible fall. EXAM: LEFT SHOULDER - 2+ VIEW COMPARISON:  AP chest 05/13/2022, 05/10/2022, 01/06/2022 FINDINGS: There is diffuse decreased bone mineralization. There is an acute, comminuted fracture of the left humeral head-neck junction with mild displacement of multiple peripheral cortical fragments, however the dominant humeral head and humeral shaft are normally aligned on frontal view. Possible up to 4 mm cortical step-off at the posterior aspect of the humeral head-surgical neck junction on oblique view. The acromioclavicular joint is appropriately aligned. Moderate overlying lateral left shoulder soft tissue swelling. IMPRESSION: Acute, comminuted fracture of the left humeral head-neck junction with mild displacement of multiple peripheral cortical fragments. Electronically Signed   By: Viann Fish.D.  On: 05/13/2022 16:01    Positive ROS: All other systems have been reviewed and were otherwise negative with the exception of those mentioned in the HPI and as above.  Physical Exam: General:  Alert, no acute distress Psychiatric:  Patient is competent for consent with normal mood and affect   Cardiovascular:  No pedal edema Respiratory:  No wheezing, non-labored breathing GI:  Abdomen is soft and non-tender Skin:  No lesions in the area of chief complaint Neurologic:  Sensation intact distally Lymphatic:  No axillary or cervical lymphadenopathy  Orthopedic Exam:  Left upper extremity Skin intact with some mild ecchymosis over the proximal humerus area and associated swelling  pain with any motion of the upper arm No pain with pronation supination of the forearm passively or motion of the wrist and hand and fingers M/U/AIN/PIN/R/Ax all appear to be functioning on exam Good radial pulse  X-rays:  X-rays of the left  shoulder images and report reviewed by myself which show a mildly comminuted and impacted humeral neck fracture with minimal displacement.  Shoulder appears to be located on scapular Y view with no dislocation of the glenohumeral joint.  No other fractures or dislocations noted. X-rays of the left elbow do not show any fractures or dislocations.  There are some mild degenerative changes.  Assessment: Left proximal humerus fracture  Plan: Ceana has a comminuted minimally displaced left proximal humerus fracture.  Her brother was at bedside at the time of the evaluation and helped with history and with arranging follow-up.  At this time we can treat with a sling immobilization and no weightbearing through the left arm.  We discussed the possibility that we may consider surgical intervention for further fracture at this time.  Given her current status and improving medical condition it is reasonable to have her follow-up after discharge in the office for repeat x-rays and a discussion about possible surgery.  He may follow-up with me or my partner Dr. Leim Fabry.  All questions answered the patient and brother agree with the above plan.  Thank you for this consult  Steffanie Rainwater MD  Beeper #:  [phone]  05/13/2022 4:44 PM

## 2022-05-14 DIAGNOSIS — N179 Acute kidney failure, unspecified: Secondary | ICD-10-CM | POA: Diagnosis not present

## 2022-05-14 DIAGNOSIS — S42295A Other nondisplaced fracture of upper end of left humerus, initial encounter for closed fracture: Secondary | ICD-10-CM

## 2022-05-14 DIAGNOSIS — E1111 Type 2 diabetes mellitus with ketoacidosis with coma: Secondary | ICD-10-CM | POA: Diagnosis not present

## 2022-05-14 LAB — GLUCOSE, CAPILLARY
Glucose-Capillary: 68 mg/dL — ABNORMAL LOW (ref 70–99)
Glucose-Capillary: 155 mg/dL — ABNORMAL HIGH (ref 70–99)
Glucose-Capillary: 258 mg/dL — ABNORMAL HIGH (ref 70–99)
Glucose-Capillary: 244 mg/dL — ABNORMAL HIGH (ref 70–99)

## 2022-05-14 LAB — BASIC METABOLIC PANEL
Anion gap: 3 — ABNORMAL LOW (ref 5–15)
BUN: 15 mg/dL (ref 6–20)
CO2: 28 mmol/L (ref 22–32)
Calcium: 7.6 mg/dL — ABNORMAL LOW (ref 8.9–10.3)
Chloride: 112 mmol/L — ABNORMAL HIGH (ref 98–111)
Creatinine, Ser: 0.72 mg/dL (ref 0.44–1.00)
GFR, Estimated: >60 mL/min (ref >60)
Glucose, Bld: 78 mg/dL (ref 70–99)
Potassium: 3.1 mmol/L — ABNORMAL LOW (ref 3.5–5.1)
Sodium: 143 mmol/L (ref 135–145)

## 2022-05-14 LAB — CBC
HCT: 30.8 % — ABNORMAL LOW (ref 36.0–46.0)
Hemoglobin: 10.1 g/dL — ABNORMAL LOW (ref 12.0–15.0)
MCH: 32.2 pg (ref 26.0–34.0)
MCHC: 32.8 g/dL (ref 30.0–36.0)
MCV: 98.1 fL (ref 80.0–100.0)
Platelets: 94 10*3/uL — ABNORMAL LOW (ref 150–400)
RBC: 3.14 MIL/uL — ABNORMAL LOW (ref 3.87–5.11)
RDW: 11.9 % (ref 11.5–15.5)
WBC: 10.5 10*3/uL (ref 4.0–10.5)
nRBC: 0 % (ref 0.0–0.2)

## 2022-05-14 LAB — MAGNESIUM: Magnesium: 2 mg/dL (ref 1.7–2.4)

## 2022-05-14 LAB — PHOSPHORUS: Phosphorus: 2.1 mg/dL — ABNORMAL LOW (ref 2.5–4.6)

## 2022-05-14 MED ORDER — POTASSIUM PHOSPHATES 15 MMOLE/5ML IV SOLN
30.0000 mmol | Freq: Once | INTRAVENOUS | Status: AC
  Administered 2022-05-14: 30 mmol via INTRAVENOUS
  Filled 2022-05-14 (×3): qty 10

## 2022-05-14 MED ORDER — INSULIN GLARGINE-YFGN 100 UNIT/ML ~~LOC~~ SOLN
10.0000 [IU] | Freq: Two times a day (BID) | SUBCUTANEOUS | Status: DC
  Administered 2022-05-14 – 2022-05-15 (×3): 10 [IU] via SUBCUTANEOUS
  Filled 2022-05-14 (×4): qty 0.1

## 2022-05-14 MED ORDER — POTASSIUM CHLORIDE CRYS ER 20 MEQ PO TBCR
40.0000 meq | EXTENDED_RELEASE_TABLET | Freq: Once | ORAL | Status: AC
  Administered 2022-05-14: 40 meq via ORAL
  Filled 2022-05-14: qty 2

## 2022-05-14 MED ORDER — SODIUM CHLORIDE 0.9 % IV SOLN
12.5000 mg | Freq: Three times a day (TID) | INTRAVENOUS | Status: DC | PRN
  Administered 2022-05-14: 12.5 mg via INTRAVENOUS
  Filled 2022-05-14: qty 0.5

## 2022-05-14 MED ORDER — ATORVASTATIN CALCIUM 20 MG PO TABS: 20.0000 mg | ORAL_TABLET | Freq: Every day | ORAL | Status: DC

## 2022-05-14 MED ORDER — CEPHALEXIN 500 MG PO CAPS
500.0000 mg | ORAL_CAPSULE | Freq: Two times a day (BID) | ORAL | Status: DC
  Administered 2022-05-14 – 2022-05-15 (×3): 500 mg via ORAL
  Filled 2022-05-14 (×4): qty 1

## 2022-05-14 NOTE — Evaluation (Signed)
Occupational Therapy Evaluation Patient Details Name: Tanya Graves MRN: [id number] DOB: 11-07-1965 Today's Date: 05/14/2022   History of Present Illness 56 y/o female presented to ED on 05/10/22 after being found unresponsive. Found to be in diabetic ketoacidosis. Intubated 12/15-12/16. Also found L proximal humerus fx with ortho treating conservatively currently. PMH: T2DM, asthma   Clinical Impression   Ms. Sandra presents with generalized weakness, limited endurance, impaired balance, and L UE pain. Pt lives w/ her 13 year old daughter in a 2-story home, ~ 7 STE. PTA, pt was IND in all fxl mobility, working full time. During today's evaluation, pt presents with 8/10 pain in L UE, extreme fatigue, nausea (vomits 2x during session). Pt is generally alert and oriented, but expresses some confusion about her course over previous few days, current date, current location. Performed sit<>stand with Mod A; worked on standing balance, w/ pt unsteady on feet and requiring HHA and very close supv. Transitioned from recliner to bed with Mod A, Mod A for sit<supine. Provided educ re: WB restrictions for LUE, w/ pt verbalizing understanding. Recommend ongoing OT during hospitalization, w/ DC home w/ HHOT to assist pt in regaining PLOF.     Recommendations for follow up therapy are one component of a multi-disciplinary discharge planning process, led by the attending physician.  Recommendations may be updated based on patient status, additional functional criteria and insurance authorization.   Follow Up Recommendations  Home health OT     Assistance Recommended at Discharge Intermittent Supervision/Assistance  Patient can return home with the following A little help with walking and/or transfers;A little help with bathing/dressing/bathroom;Assistance with cooking/housework;Assist for transportation;Help with stairs or ramp for entrance    Functional Status Assessment  Patient has had a recent decline in  their functional status and demonstrates the ability to make significant improvements in function in a reasonable and predictable amount of time.  Equipment Recommendations  None recommended by OT    Recommendations for Other Services       Precautions / Restrictions Precautions Precautions: Fall Required Braces or Orthoses: Sling Restrictions Weight Bearing Restrictions: Yes LUE Weight Bearing: Non weight bearing      Mobility Bed Mobility Overal bed mobility: Needs Assistance Bed Mobility: Sit to Supine       Sit to supine: Mod assist   General bed mobility comments: assistance for elevating b/l LE to bed level, repositioning in bed    Transfers Overall transfer level: Needs assistance Equipment used: 1 person hand held assist Transfers: Sit to/from Stand, Bed to chair/wheelchair/BSC Sit to Stand: Mod assist     Step pivot transfers: Mod assist     General transfer comment: Increased time and effort. Mod A for powering into standing x 3. Mod A for steadying during step-pivot transfer      Balance Overall balance assessment: Needs assistance Sitting-balance support: No upper extremity supported, Feet supported Sitting balance-Leahy Scale: Good     Standing balance support: Single extremity supported, During functional activity Standing balance-Leahy Scale: Poor Standing balance comment: Dizziness, LOB in standing, w/ pt requiring very close SUPV                           ADL either performed or assessed with clinical judgement   ADL Overall ADL's : Needs assistance/impaired  General ADL Comments: Anticipate max A with OOB mobility, 2/2 debility, weakness, dizziness, unsteadiness.     Vision         Perception     Praxis      Pertinent Vitals/Pain Pain Assessment Faces Pain Scale: Hurts whole lot Pain Location: L UE with movement and touch Pain Descriptors / Indicators: Grimacing,  Guarding, Aching Pain Intervention(s): Repositioned, Relaxation, Limited activity within patient's tolerance, Monitored during session     Hand Dominance     Extremity/Trunk Assessment Upper Extremity Assessment Upper Extremity Assessment: Generalized weakness;LUE deficits/detail LUE Deficits / Details: in sling, NWB, post fx. ROM in wrist, digits WFL. Swelling and bruising in upper arm and forearm   Lower Extremity Assessment Lower Extremity Assessment: Generalized weakness   Cervical / Trunk Assessment Cervical / Trunk Assessment: Normal   Communication Communication Communication: No difficulties   Cognition Arousal/Alertness: Awake/alert Behavior During Therapy: WFL for tasks assessed/performed Overall Cognitive Status: No family/caregiver present to determine baseline cognitive functioning                                 General Comments: seems WFL but no family present     General Comments       Exercises Other Exercises Other Exercises: Educ re: rehab process, falls prevention, WB restrictions. UE therex. Cognitive re-orientation   Shoulder Instructions      Home Living Family/patient expects to be discharged to:: Private residence Living Arrangements: Children Available Help at Discharge: Family;Available PRN/intermittently Type of Home: House Home Access: Stairs to enter [employer] of Steps: 6-8 Entrance Stairs-Rails: Right;Left Home Layout: Two level;Able to live on main level with bedroom/bathroom Alternate Level Stairs-Number of Steps: office on 2nd floor       Bathroom Toilet: Standard     Home Equipment: None          Prior Functioning/Environment Prior Level of Function : Independent/Modified Independent;Working/employed;Driving             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 Problem List: Decreased strength;Decreased range of motion;Decreased activity tolerance;Impaired  balance (sitting and/or standing);Pain;Impaired UE functional use;Increased edema;Decreased knowledge of precautions      OT Treatment/Interventions: Self-care/ADL training;Therapeutic exercise;Patient/family education;Balance training;Energy conservation;Therapeutic activities;DME and/or AE instruction    OT Goals(Current goals can be found in the care plan section) Acute Rehab OT Goals Patient Stated Goal: to get an insulin pump OT Goal Formulation: With patient Time For Goal Achievement: 05/28/22 Potential to Achieve Goals: Good ADL Goals Pt Will Perform Grooming: standing;with min assist (while adhering to UE WB precautions) Pt Will Perform Upper Body Dressing: with modified independence;sitting;standing (in sitting or standing, using b/l UE) Pt Will Transfer to Toilet: with min guard assist;regular height toilet;ambulating  OT Frequency: Min 2X/week    Co-evaluation              AM-PAC OT "6 Clicks" Daily Activity     Outcome Measure Help from another person eating meals?: A Little Help from another person taking care of personal grooming?: A Little Help from another person toileting, which includes using toliet, bedpan, or urinal?: A Lot Help from another person bathing (including washing, rinsing, drying)?: A Lot Help from another person to put on and taking off regular upper body clothing?: A Lot Help from another person to put on and taking off regular lower body clothing?: A Lot 6  Click Score: 14   End of Session Nurse Communication: Mobility status  Activity Tolerance: Patient tolerated treatment well Patient left: in bed;with nursing/sitter in room;with call bell/phone within reach  OT Visit Diagnosis: Unsteadiness on feet (R26.81);Muscle weakness (generalized) (M62.81)                Time: 6184-8592 OT Time Calculation (min): 50 min Charges:  OT General Charges $OT Visit: 1 Visit OT Evaluation $OT Eval Moderate Complexity: 1 Mod OT Treatments $Self  Care/Home Management : 38-52 mins Josiah Lobo, PhD, MS, OTR/L 05/14/22, 4:53 PM

## 2022-05-14 NOTE — Evaluation (Signed)
Physical Therapy Evaluation Patient Details Name: Tanya Graves MRN: [id number] DOB: [birth year] Today's Date: 05/14/2022  History of Present Illness  56 y/o female presented to ED on 05/10/22 after being found unresponsive. Found to be in diabetic ketoacidosis. Intubated 12/15-12/16. Also found L proximal humerus fx with ortho treating conservatively currently. PMH: T2DM, asthma  Clinical Impression  Patient admitted with the above. PTA, patient lives with 47 y/o daughter and was independent and working full time. Currently, patient presents with weakness, impaired balance, and decreased activity tolerance. Educated patient on WB restrictions and sling use for mobility, patient verbalized understanding. Patient was nauseous throughout session with intermittent emesis which limited mobility. Able to complete bed mobility with minA and sit to stand with min guard. Took steps over to recliner with min guard and HHAx1. Motivated to get stronger to be able to discharge home. Patient will benefit from skilled PT services during acute stay to address listed deficits. Recommend HHPT at this time to maximize functional mobility and safety. Discussed with patient about having assistance at home for safety, patient verbalized understanding and stated she would arrange with her siblings.        Recommendations for follow up therapy are one component of a multi-disciplinary discharge planning process, led by the attending physician.  Recommendations may be updated based on patient status, additional functional criteria and insurance authorization.  Follow Up Recommendations Home health PT      Assistance Recommended at Discharge Frequent or constant Supervision/Assistance  Patient can return home with the following  A little help with walking and/or transfers;A little help with bathing/dressing/bathroom;Assistance with cooking/housework;Assist for transportation;Help with stairs or ramp for entrance     Equipment Recommendations None recommended by PT  Recommendations for Other Services       Functional Status Assessment Patient has had a recent decline in their functional status and demonstrates the ability to make significant improvements in function in a reasonable and predictable amount of time.     Precautions / Restrictions Precautions Precautions: Fall Required Braces or Orthoses: Sling Restrictions Weight Bearing Restrictions: Yes LUE Weight Bearing: Non weight bearing      Mobility  Bed Mobility Overal bed mobility: Needs Assistance Bed Mobility: Supine to Sit     Supine to sit: Min assist     General bed mobility comments: assist for trunk elevation but able to reposition hips towards EOB without assistance    Transfers Overall transfer level: Needs assistance Equipment used: 1 person hand held assist Transfers: Sit to/from Stand Sit to Stand: Min guard           General transfer comment: min guard for safety. Increased time to complete    Ambulation/Gait Ambulation/Gait assistance: Min guard Gait Distance (Feet): 4 Feet Assistive device: 1 person hand held assist Gait Pattern/deviations: Step-to pattern, Decreased stride length Gait velocity: decreased     General Gait Details: able to take steps towards recliner with HHA. No overt LOB but patient reporting feeling unsteady as this is first time getting OOB since admission.  Stairs            Wheelchair Mobility    Modified Rankin (Stroke Patients Only)       Balance Overall balance assessment: Needs assistance Sitting-balance support: No upper extremity supported, Feet supported Sitting balance-Leahy Scale: Good     Standing balance support: Single extremity supported, During functional activity Standing balance-Leahy Scale: Fair  Pertinent Vitals/Pain Pain Assessment Pain Assessment: Faces Faces Pain Scale: Hurts whole lot Pain  Location: L UE with movement Pain Descriptors / Indicators: Grimacing, Guarding Pain Intervention(s): Monitored during session, Repositioned    Home Living Family/patient expects to be discharged to:: Private residence Living Arrangements: Children Available Help at Discharge: Family Type of Home: House Home Access: Stairs to enter Entrance Stairs-Rails: Psychiatric nurse of Steps: 6-8   Home Layout: One level Home Equipment: None      Prior Function Prior Level of Function : Independent/Modified Independent;Working/employed;Driving             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        Extremity/Trunk Assessment   Upper Extremity Assessment Upper Extremity Assessment: Defer to OT evaluation    Lower Extremity Assessment Lower Extremity Assessment: Generalized weakness    Cervical / Trunk Assessment Cervical / Trunk Assessment: Normal  Communication   Communication: No difficulties  Cognition Arousal/Alertness: Awake/alert Behavior During Therapy: WFL for tasks assessed/performed Overall Cognitive Status: No family/caregiver present to determine baseline cognitive functioning                                 General Comments: seems WFL but no family present        General Comments      Exercises     Assessment/Plan    PT Assessment Patient needs continued PT services  PT Problem List Decreased strength;Decreased activity tolerance;Decreased balance;Decreased mobility;Decreased knowledge of precautions       PT Treatment Interventions DME instruction;Gait training;Functional mobility training;Therapeutic activities;Therapeutic exercise;Balance training;Patient/family education;Stair training    PT Goals (Current goals can be found in the Care Plan section)  Acute Rehab PT Goals Patient Stated Goal: to get stronger PT Goal Formulation: With patient Time For Goal Achievement:  05/28/22 Potential to Achieve Goals: Good    Frequency Min 2X/week     Co-evaluation               AM-PAC PT "6 Clicks" Mobility  Outcome Measure Help needed turning from your back to your side while in a flat bed without using bedrails?: A Little Help needed moving from lying on your back to sitting on the side of a flat bed without using bedrails?: A Little Help needed moving to and from a bed to a chair (including a wheelchair)?: A Little Help needed standing up from a chair using your arms (e.g., wheelchair or bedside chair)?: A Little Help needed to walk in hospital room?: A Little Help needed climbing 3-5 steps with a railing? : A Lot 6 Click Score: 17    End of Session Equipment Utilized During Treatment: Other (comment) (sling) Activity Tolerance: Patient tolerated treatment well Patient left: in chair;with call bell/phone within reach Nurse Communication: Mobility status PT Visit Diagnosis: Unsteadiness on feet (R26.81);Muscle weakness (generalized) (M62.81);Difficulty in walking, not elsewhere classified (R26.2)    Time: 6060-0459 PT Time Calculation (min) (ACUTE ONLY): 42 min   Charges:   PT Evaluation $PT Eval Moderate Complexity: 1 Mod PT Treatments $Therapeutic Activity: 23-37 mins        Tanya Graves PT, DPT [facility] Acute Rehabilitation Services   Tanya Graves 05/14/2022, 3:59 PM

## 2022-05-14 NOTE — Progress Notes (Signed)
[facility] [place] NAME: Tanya Graves    MR#:  [id number]  DATE OF BIRTH:  [birth year]  SUBJECTIVE:  patient transferred to [facility] service. Seen earlier. Patient tearful after remembering the events of her presentation emergency room. She is grateful she is alive and doing better. Appears pretty motivated and positive. No family at bedside.    VITALS:  Blood pressure 121/65, pulse 81, temperature 98.8 F (37.1 C), temperature source Oral, resp. rate 18, height '5\' 8"'$  (1.727 m), weight 114.1 kg, last menstrual period 06/28/2015, SpO2 94 %.  PHYSICAL EXAMINATION:   GENERAL:  56 y.o.-year-old patient lying in the bed with no acute distress.  LUNGS: Normal breath sounds bilaterally, no wheezing CARDIOVASCULAR: S1, S2 normal. No murmur   ABDOMEN: Soft, nontender, nondistended. Bowel sounds present.  EXTREMITIES: left UE decreased ROM due to fracture  NEUROLOGIC: nonfocal  patient is alert and awake SKIN: No obvious rash, lesion, or ulcer.   LABORATORY PANEL:  CBC Recent Labs  Lab 05/14/22 0507  WBC 10.5  HGB 10.1*  HCT 30.8*  PLT 94*    Chemistries  Recent Labs  Lab 05/10/22 1342 05/10/22 2020 05/14/22 0507  NA 135   < > 143  K 7.3*   < > 3.1*  CL 98   < > 112*  CO2 <7*   < > 28  GLUCOSE 1,054*   < > 78  BUN 31*   < > 15  CREATININE 2.39*   < > 0.72  CALCIUM 7.9*   < > 7.6*  MG  --    < > 2.0  AST 38  --   --   ALT 21  --   --   ALKPHOS 100  --   --   BILITOT 2.8*  --   --    < > = values in this interval not displayed.    RADIOLOGY:  DG Elbow 2 Views Left  Result Date: 05/13/2022 CLINICAL DATA:  Swelling. Possible fall prior to coming to this facility. EXAM: LEFT ELBOW - 2 VIEW COMPARISON:  None Available. FINDINGS: Normal position of the distal anterior humeral fat pad without evidence of elbow joint effusion. Joint spaces are preserved. No acute fracture is seen. No dislocation. IMPRESSION: No acute fracture.  Electronically Signed   By: Yvonne Kendall M.D.   On: 05/13/2022 16:02   DG Shoulder Left  Result Date: 05/13/2022 CLINICAL DATA:  Left shoulder and elbow swelling.  Possible fall. EXAM: LEFT SHOULDER - 2+ VIEW COMPARISON:  AP chest 05/13/2022, 05/10/2022, 01/06/2022 FINDINGS: There is diffuse decreased bone mineralization. There is an acute, comminuted fracture of the left humeral head-neck junction with mild displacement of multiple peripheral cortical fragments, however the dominant humeral head and humeral shaft are normally aligned on frontal view. Possible up to 4 mm cortical step-off at the posterior aspect of the humeral head-surgical neck junction on oblique view. The acromioclavicular joint is appropriately aligned. Moderate overlying lateral left shoulder soft tissue swelling. IMPRESSION: Acute, comminuted fracture of the left humeral head-neck junction with mild displacement of multiple peripheral cortical fragments. Electronically Signed   By: Yvonne Kendall M.D.   On: 05/13/2022 16:01    Assessment and Plan Tanya Graves is a 56 y.o. female with a history of diabetes and asthma who presents with unresponsive.  History from EMS is that patient has been treated for sinusitis of late.  Her daughter found her minimally responsive this morning.  She  has a history of diabetes.  Blood sugar was reading high.    Reviewed patient's recent PC P visit on 12/12, she was seen for sinus congestion bilateral ear pain and cough for 2 to 3 days and an exposure to RSV.  She was treated for bacterial sinusitis with Augmentin, and was also found to have a left otitis media.   Diabetic ketoacidosis with acute metabolic encephalopathy -- patient is now transfer from ICU. -- She was found unresponsive at home. She was brought to the emergency room. Found to have blood glucose 1054 -- she was extremely stenotic and was intubated placed on the ventilator. Now extubated and on room air. -- Patient was placed on  IV fluids, insulin drip. DKA resolved -- was started on antibiotic empirically will switch to PO Keflex for two more days  Type II diabetes with hyperglycemia -- patient placed on long-acting insulin and sliding scale -- discuss diet and follow-up with endocrinology  Left humerus proximal fracture, minimal displaced status post fall -- seen by Dr. Karel Jarvis orthopedic-- recommends sling immobilization and no weight bearing through left arm. -- Outpatient follow-up  Jerrye Bushy continue PPI  Hypokalemia -- replace potassium  PT OT to see patient transfer out of ICU TOC for discharge planning to home likely tomorrow  Family communication :none Consults :ortho,PPCM CODE STATUS:full  DVT Prophylaxis : Lovenox Level of care: Med-Surg Status is: Inpatient Remains inpatient appropriate because: DKA resolved. Will transfer of ICU. Discharge 1-2 days    TOTAL TIME TAKING CARE OF THIS PATIENT: 35 minutes.  >50% time spent on counselling and coordination of care  Note: This dictation was prepared with Dragon dictation along with smaller phrase technology. Any transcriptional errors that result from this process are unintentional.  Fritzi Mandes M.D    [facility]   CC: Primary care physician; [name], MD

## 2022-05-15 DIAGNOSIS — E1011 Type 1 diabetes mellitus with ketoacidosis with coma: Secondary | ICD-10-CM | POA: Diagnosis not present

## 2022-05-15 DIAGNOSIS — K219 Gastro-esophageal reflux disease without esophagitis: Secondary | ICD-10-CM | POA: Diagnosis not present

## 2022-05-15 DIAGNOSIS — G9341 Metabolic encephalopathy: Secondary | ICD-10-CM

## 2022-05-15 DIAGNOSIS — E870 Hyperosmolality and hypernatremia: Secondary | ICD-10-CM

## 2022-05-15 DIAGNOSIS — S42292S Other displaced fracture of upper end of left humerus, sequela: Secondary | ICD-10-CM | POA: Diagnosis not present

## 2022-05-15 LAB — BASIC METABOLIC PANEL
Anion gap: 7 (ref 5–15)
BUN: 53 mg/dL — ABNORMAL HIGH (ref 6–20)
CO2: 26 mmol/L (ref 22–32)
Calcium: 8.6 mg/dL — ABNORMAL LOW (ref 8.9–10.3)
Chloride: 106 mmol/L (ref 98–111)
Creatinine, Ser: 1.36 mg/dL — ABNORMAL HIGH (ref 0.44–1.00)
GFR, Estimated: 46 mL/min — ABNORMAL LOW (ref >60)
Glucose, Bld: 121 mg/dL — ABNORMAL HIGH (ref 70–99)
Potassium: 4.5 mmol/L (ref 3.5–5.1)
Sodium: 139 mmol/L (ref 135–145)

## 2022-05-15 LAB — CULTURE, BLOOD (ROUTINE X 2)
Culture: NO GROWTH
Culture: NO GROWTH

## 2022-05-15 LAB — MAGNESIUM: Magnesium: 2.3 mg/dL (ref 1.7–2.4)

## 2022-05-15 LAB — BLOOD GAS, ARTERIAL
Acid-base deficit: 17 mmol/L — ABNORMAL HIGH (ref 0.0–2.0)
Bicarbonate: 12.5 mmol/L — ABNORMAL LOW (ref 20.0–28.0)
FIO2: 60 %
MECHVT: 480 mL
O2 Saturation: 98.9 %
PEEP: 5 cmH2O
Patient temperature: 37
RATE: 32 resp/min
pCO2 arterial: 42 mmHg (ref 32–48)
pH, Arterial: 7.08 — CL (ref 7.35–7.45)
pO2, Arterial: 156 mmHg — ABNORMAL HIGH (ref 83–108)

## 2022-05-15 LAB — GLUCOSE, CAPILLARY
Glucose-Capillary: 135 mg/dL — ABNORMAL HIGH (ref 70–99)
Glucose-Capillary: 191 mg/dL — ABNORMAL HIGH (ref 70–99)
Glucose-Capillary: 207 mg/dL — ABNORMAL HIGH (ref 70–99)

## 2022-05-15 LAB — PHOSPHORUS: Phosphorus: 4 mg/dL (ref 2.5–4.6)

## 2022-05-15 MED ORDER — ATORVASTATIN CALCIUM 20 MG PO TABS: 80.0000 mg | ORAL_TABLET | Freq: Every day | ORAL | 3 refills | Status: DC

## 2022-05-15 MED ORDER — CEPHALEXIN 500 MG PO CAPS: 500.0000 mg | ORAL_CAPSULE | Freq: Two times a day (BID) | ORAL | 0 refills | Status: AC

## 2022-05-15 MED ORDER — SODIUM CHLORIDE 0.9 % IV BOLUS
500.0000 mL | Freq: Once | INTRAVENOUS | Status: AC
  Administered 2022-05-15: 500 mL via INTRAVENOUS

## 2022-05-15 MED ORDER — INSULIN ASPART 100 UNIT/ML IJ SOLN: 18.0000 [IU] | Freq: Three times a day (TID) | INTRAMUSCULAR | 0 refills | Status: DC

## 2022-05-15 MED ORDER — VITAMIN B-1 100 MG PO TABS: 100.0000 mg | ORAL_TABLET | Freq: Every day | ORAL | 0 refills | Status: DC

## 2022-05-15 MED ORDER — FOLIC ACID 1 MG PO TABS: 1.0000 mg | ORAL_TABLET | Freq: Every day | ORAL | 0 refills | Status: DC

## 2022-05-15 MED ORDER — TOUJEO SOLOSTAR 300 UNIT/ML ~~LOC~~ SOPN: 24.0000 [IU] | PEN_INJECTOR | Freq: Every day | SUBCUTANEOUS | 0 refills | Status: DC

## 2022-05-15 MED ORDER — POLYETHYLENE GLYCOL 3350 17 G PO PACK: 17.0000 g | PACK | Freq: Every day | ORAL | 0 refills | Status: DC | PRN

## 2022-05-15 MED ORDER — ADULT MULTIVITAMIN W/MINERALS CH: 1.0000 | ORAL_TABLET | Freq: Every day | ORAL | 0 refills | Status: DC

## 2022-05-15 MED ORDER — ASPIRIN 81 MG PO CHEW: 81.0000 mg | CHEWABLE_TABLET | Freq: Every day | ORAL | 0 refills | Status: DC

## 2022-05-15 MED ORDER — PANTOPRAZOLE SODIUM 40 MG PO TBEC: 40.0000 mg | DELAYED_RELEASE_TABLET | Freq: Every day | ORAL | 0 refills | Status: DC

## 2022-05-15 NOTE — Discharge Summary (Signed)
Physician Discharge Summary   Patient: Tanya Graves MRN: [id number] DOB: 18-Jun-1965  Admit date:     05/10/2022  Discharge date: 05/15/22  Discharge Physician: Loletha Grayer   PCP: [name], MD   Recommendations at discharge:   Follow-up with PCP 5 days  Discharge Diagnoses: Principal Problem:   DKA (diabetic ketoacidosis) ([facility]) Active Problems:   AKI (acute kidney injury) ([facility])   Sepsis ([place])   Hypothermia   Lactic acid acidosis   Hypovolemic shock ([facility])   Closed fracture of left proximal humerus    Hospital Course: 56 y.o. female with a history of diabetes and asthma who presents with unresponsive.  History from EMS is that patient has been treated for sinusitis of late.  Her daughter found her minimally responsive this morning.  She has a history of diabetes.  Blood sugar was reading high.    Reviewed patient's recent PC P visit on 12/12, she was seen for sinus congestion bilateral ear pain and cough for 2 to 3 days and an exposure to RSV.  She was treated for bacterial sinusitis with Augmentin, and was also found to have a left otitis media.  The patient was admitted by the critical care service on 05/10/2022.  She was unresponsive but cannot coming in and was in DKA with acute kidney injury.  She was started on Levophed for hypotension and bicarb drip.  She required intubation on 05/11/2019 3 in the afternoon.  Patient was also found to have a left proximal humerus fracture and seen by Dr. Karel Jarvis who recomended immobilization.  The patient was extubated on 05/13/2022.   Patient was transferred to the medical service on 05/14/2022.  The patient wanted to go home on 05/15/2022.  I did give a fluid bolus since her creatinine creeped up a little bit.  Patient stated that she ate lunch and walked well with physical therapy.  Patient will go back on her Toujeo insulin and sliding scale.  Assessment and Plan: 1.  Diabetic ketoacidosis with acute metabolic encephalopathy.   Patient was found unresponsive at home brought into the emergency room and found to have a glucose of 1054.  Patient was given twice a day Semglee insulin here in the hospital.  Case discussed with diabetes coordinator we will go back on Toujeo 24 units at night and short acting insulin prior to meals.  Hemoglobin A1c on 02/14/2022 is 12.7.  Patient vies to get back to her endocrinologist.  She is interested in an insulin pump. 2.  Acute metabolic encephalopathy likely secondary to diabetic ketoacidosis.  This has improved. 3.  Left proximal humerus fracture, minimal displacement status post fall.  Orthopedic surgery Dr. Paulita Cradle remains on consultation recommend sling immobilization and outpatient follow-up. 4.  GERD on PPI 5.  Hypokalemia replaced 6.  Acute kidney injury.  Creatinine peaked at 2.39 and went as low as 0.72.  Creatinine upon discharge 1.36.  I did give a fluid bolus prior to disposition. 7.  Septic shock ruled out.  Cultures were negative.  Finish Course of Keflex. 8.  Hypernatremia secondary to poor appetite.  Sodium 139 upon discharge.       Consultants: Critical care specialist Procedures performed: Intubation and extubation Disposition: Home health Diet recommendation:  Cardiac and Carb modified diet DISCHARGE MEDICATION: Allergies as of 05/15/2022   No Known Allergies      Medication List     STOP taking these medications    amoxicillin-clavulanate 875-125 MG tablet Commonly known as: AUGMENTIN  meloxicam 15 MG tablet Commonly known as: MOBIC   tirzepatide 7.5 MG/0.5ML Pen Commonly known as: MOUNJARO       TAKE these medications    albuterol 108 (90 Base) MCG/ACT inhaler Commonly known as: VENTOLIN HFA Inhale 2 puffs into the lungs every 6 (six) hours as needed for wheezing or shortness of breath.   aspirin 81 MG chewable tablet Chew 1 tablet (81 mg total) by mouth daily. Start taking on: May 16, 2022   atorvastatin 20 MG tablet Commonly known  as: LIPITOR Take 4 tablets (80 mg total) by mouth daily. What changed: how much to take   cephALEXin 500 MG capsule Commonly known as: KEFLEX Take 1 capsule (500 mg total) by mouth every 12 (twelve) hours for 5 doses.   estradiol 0.1 MG/GM vaginal cream Commonly known as: ESTRACE Place 1 Applicatorful vaginally at bedtime.   folic acid 1 MG tablet Commonly known as: FOLVITE Take 1 tablet (1 mg total) by mouth daily. Start taking on: May 16, 2022   insulin aspart 100 UNIT/ML injection Commonly known as: NovoLOG Inject 18 Units into the skin 3 (three) times daily with meals. What changed: See the new instructions.   levothyroxine 175 MCG tablet Commonly known as: SYNTHROID Take 1 tablet (175 mcg total) by mouth daily with breakfast.   multivitamin with minerals Tabs tablet Take 1 tablet by mouth daily. Start taking on: May 16, 2022   pantoprazole 40 MG tablet Commonly known as: PROTONIX Take 1 tablet (40 mg total) by mouth daily. Start taking on: May 16, 2022   polyethylene glycol 17 g packet Commonly known as: MIRALAX / GLYCOLAX Take 17 g by mouth daily as needed for moderate constipation.   thiamine 100 MG tablet Commonly known as: Vitamin B-1 Take 1 tablet (100 mg total) by mouth daily. Start taking on: May 16, 2022   Toujeo SoloStar 300 UNIT/ML Solostar Pen Generic drug: insulin glargine (1 Unit Dial) Inject 24 Units into the skin at bedtime. What changed: when to take this        Follow-up Information     Bedsole, Amy E, MD Follow up in 5 day(s).   Specialty: Family Medicine Contact information: [place] [address] [phone]         Steffanie Rainwater, MD Follow up in 2 week(s).   Specialty: Orthopedic Surgery Contact information: [address] [place] [address] [phone]                Discharge Exam: [name] Weights   05/11/22 0500 05/14/22 0500 05/15/22 0424  Weight: 101.5  kg 114.1 kg 116 kg   Physical Exam HENT:     Head: Normocephalic.     Mouth/Throat:     Pharynx: No oropharyngeal exudate.  Eyes:     General: Lids are normal.     Conjunctiva/sclera: Conjunctivae normal.  Cardiovascular:     Rate and Rhythm: Normal rate and regular rhythm.     Heart sounds: Normal heart sounds, S1 normal and S2 normal.  Pulmonary:     Breath sounds: No decreased breath sounds, wheezing, rhonchi or rales.  Abdominal:     Palpations: Abdomen is soft.     Tenderness: There is no abdominal tenderness.  Musculoskeletal:     Right lower leg: No swelling.     Left lower leg: No swelling.  Skin:    General: Skin is warm.     Findings: No rash.  Neurological:  Mental Status: She is alert and oriented to person, place, and time.      Condition at discharge: stable  The results of significant diagnostics from this hospitalization (including imaging, microbiology, ancillary and laboratory) are listed below for reference.   Imaging Studies: DG Elbow 2 Views Left  Result Date: 05/13/2022 CLINICAL DATA:  Swelling. Possible fall prior to coming to this facility. EXAM: LEFT ELBOW - 2 VIEW COMPARISON:  None Available. FINDINGS: Normal position of the distal anterior humeral fat pad without evidence of elbow joint effusion. Joint spaces are preserved. No acute fracture is seen. No dislocation. IMPRESSION: No acute fracture. Electronically Signed   By: Yvonne Kendall M.D.   On: 05/13/2022 16:02   DG Shoulder Left  Result Date: 05/13/2022 CLINICAL DATA:  Left shoulder and elbow swelling.  Possible fall. EXAM: LEFT SHOULDER - 2+ VIEW COMPARISON:  AP chest 05/13/2022, 05/10/2022, 01/06/2022 FINDINGS: There is diffuse decreased bone mineralization. There is an acute, comminuted fracture of the left humeral head-neck junction with mild displacement of multiple peripheral cortical fragments, however the dominant humeral head and humeral shaft are normally aligned on frontal view.  Possible up to 4 mm cortical step-off at the posterior aspect of the humeral head-surgical neck junction on oblique view. The acromioclavicular joint is appropriately aligned. Moderate overlying lateral left shoulder soft tissue swelling. IMPRESSION: Acute, comminuted fracture of the left humeral head-neck junction with mild displacement of multiple peripheral cortical fragments. Electronically Signed   By: Yvonne Kendall M.D.   On: 05/13/2022 16:01   ECHOCARDIOGRAM COMPLETE  Result Date: 05/11/2022    ECHOCARDIOGRAM REPORT   Patient Name:   Tanya Graves [place] Date of Exam: 05/11/2022 Medical Rec #:  [id number]        Height:       68.0 in Accession #:    [id number]       Weight:       223.8 lb Date of Birth:  [birth year]         BSA:          2.144 m Patient Age:    39 years         BP:           123/61 mmHg Patient Gender: F                HR:           92 bpm. Exam Location:  [facility] Procedure: 2D Echo Indications:     Shock  History:         Patient has no prior history of Echocardiogram examinations.  Sonographer:     Kathlen Brunswick RDCS Referring Phys:  [id number] Bradly Bienenstock Diagnosing Phys: Ida Rogue MD  Sonographer Comments: Technically difficult study due to poor echo windows and echo performed with patient supine and on artificial respirator. Image acquisition challenging due to respiratory motion. IMPRESSIONS  1. Left ventricular ejection fraction, by estimation, is 60 to 65%. The left ventricle has normal function. The left ventricle has no regional wall motion abnormalities. Left ventricular diastolic parameters are consistent with Grade I diastolic dysfunction (impaired relaxation).  2. Right ventricular systolic function is normal. The right ventricular size is normal. Tricuspid regurgitation signal is inadequate for assessing PA pressure.  3. The mitral valve was not well visualized. No evidence of mitral valve regurgitation. No evidence of mitral stenosis.  4. The aortic valve was not well  visualized. Aortic valve regurgitation is not visualized. No aortic stenosis is present.  5. The inferior vena cava is dilated in size with >50% respiratory variability, suggesting right atrial pressure of 8 mmHg. FINDINGS  Left Ventricle: Left ventricular ejection fraction, by estimation, is 60 to 65%. The left ventricle has normal function. The left ventricle has no regional wall motion abnormalities. The left ventricular internal cavity size was normal in size. There is  no left ventricular hypertrophy. Left ventricular diastolic parameters are consistent with Grade I diastolic dysfunction (impaired relaxation). Right Ventricle: The right ventricular size is normal. No increase in right ventricular wall thickness. Right ventricular systolic function is normal. Tricuspid regurgitation signal is inadequate for assessing PA pressure. Left Atrium: Left atrial size was normal in size. Right Atrium: Right atrial size was normal in size. Pericardium: There is no evidence of pericardial effusion. Mitral Valve: The mitral valve was not well visualized. There is mild calcification of the mitral valve leaflet(s). No evidence of mitral valve regurgitation. No evidence of mitral valve stenosis. Tricuspid Valve: The tricuspid valve is normal in structure. Tricuspid valve regurgitation is not demonstrated. No evidence of tricuspid stenosis. Aortic Valve: The aortic valve was not well visualized. Aortic valve regurgitation is not visualized. No aortic stenosis is present. Aortic valve peak gradient measures 11.6 mmHg. Pulmonic Valve: The pulmonic valve was normal in structure. Pulmonic valve regurgitation is not visualized. No evidence of pulmonic stenosis. Aorta: The aortic root is normal in size and structure. Venous: The inferior vena cava is dilated in size with greater than 50% respiratory variability, suggesting right atrial pressure of 8 mmHg. IAS/Shunts: No atrial level shunt detected by color flow Doppler.  LEFT  VENTRICLE PLAX 2D LVIDd:         4.30 cm     Diastology LVIDs:         3.00 cm     LV e' medial:    5.77 cm/s LV PW:         1.00 cm     LV E/e' medial:  11.4 LV IVS:        1.10 cm     LV e' lateral:   11.40 cm/s                            LV E/e' lateral: 5.8  LV Volumes (MOD) LV vol d, MOD A4C: 30.1 ml LV vol s, MOD A4C: 12.3 ml LV SV MOD A4C:     30.1 ml RIGHT VENTRICLE RV Basal diam:  3.10 cm LEFT ATRIUM             Index        RIGHT ATRIUM           Index LA diam:        3.20 cm 1.49 cm/m   RA Area:     11.80 cm LA Vol (A2C):   56.4 ml 26.31 ml/m  RA Volume:   28.10 ml  13.11 ml/m LA Vol (A4C):   28.5 ml 13.30 ml/m LA Biplane Vol: 40.7 ml 18.99 ml/m  AORTIC VALVE              PULMONIC VALVE AV Vmax:      170.00 cm/s PV Vmax:       1.22 m/s AV Peak Grad: 11.6 mmHg   PV Peak grad:  6.0 mmHg LVOT Vmax:    139.00 cm/s LVOT Vmean:   91.300 cm/s LVOT VTI:     0.225 m MITRAL VALVE MV Area (PHT): 2.97 cm  SHUNTS MV Decel Time: 255 msec    Systemic VTI: 0.22 m MV E velocity: 66.00 cm/s MV A velocity: 81.20 cm/s MV E/A ratio:  0.81 Ida Rogue MD Electronically signed by Ida Rogue MD Signature Date/Time: 05/11/2022/9:47:09 AM    Final    DG Chest Port 1 View  Result Date: 05/11/2022 CLINICAL DATA:  [id number] with hypoxic respiratory failure. EXAM: PORTABLE CHEST 1 VIEW COMPARISON:  Portable chest yesterday at 4:02 p.m. FINDINGS: 5:01 a.m. ETT interval advancement to within 4.5 cm of the carina. NGT is well inside the stomach but neither the side-hole or tip are filmed. Partially translucent electrical pads again overlie the left chest. The heart is slightly enlarged. There is mild central vascular prominence without edema. The lungs are clear of focal infiltrates and no pleural effusion is seen. Mediastinum is normally outlined. There is a recent comminuted transverse slightly impacted surgical neck fracture of the proximal left humerus. The first films on which this was identified were yesterday's  portable chest films. The distal fragment is translated slightly laterally, but otherwise appears nondisplaced AP. IMPRESSION: 1. Mild cardiomegaly and mild central vascular prominence. No other evidence of acute cardiopulmonary process. 2. Recent comminuted transverse slightly impacted surgical neck fracture of the proximal left humerus. Electronically Signed   By: Telford Nab M.D.   On: 05/11/2022 07:27   DG Abd Portable 1V  Result Date: 05/11/2022 CLINICAL DATA:  Orogastric tube. EXAM: PORTABLE ABDOMEN - 1 VIEW COMPARISON:  None Available. FINDINGS: Orogastric tube tip is at the level of the mid stomach. No dilated bowel loops are seen. IMPRESSION: Orogastric tube tip is at the level of the mid stomach. Electronically Signed   By: Ronney Asters M.D.   On: 05/11/2022 01:15   DG Chest Port 1 View  Result Date: 05/10/2022 CLINICAL DATA:  Unwitnessed fall. EXAM: PORTABLE CHEST 1 VIEW COMPARISON:  Same day. FINDINGS: The heart size and mediastinal contours are within normal limits. Endotracheal tube is in grossly good position. Both lungs are clear. The visualized skeletal structures are unremarkable. IMPRESSION: Endotracheal tube is noted. No acute cardiopulmonary abnormality seen. Electronically Signed   By: Marijo Conception M.D.   On: 05/10/2022 16:23   CT ABDOMEN PELVIS WO CONTRAST  Result Date: 05/10/2022 CLINICAL DATA:  Sepsis. EXAM: CT ABDOMEN AND PELVIS WITHOUT CONTRAST TECHNIQUE: Multidetector CT imaging of the abdomen and pelvis was performed following the standard protocol without IV contrast. RADIATION DOSE REDUCTION: This exam was performed according to the departmental dose-optimization program which includes automated exposure control, adjustment of the mA and/or kV according to patient size and/or use of iterative reconstruction technique. COMPARISON:  January 06, 2022. FINDINGS: Lower chest: No acute abnormality. Hepatobiliary: No focal liver abnormality is seen. No gallstones,  gallbladder wall thickening, or biliary dilatation. Pancreas: Unremarkable. No pancreatic ductal dilatation or surrounding inflammatory changes. Spleen: Normal in size without focal abnormality. Adrenals/Urinary Tract: Right adrenal gland is unremarkable. 2.9 cm left adrenal nodule is noted. No hydronephrosis or renal obstruction is noted. Urinary bladder is decompressed secondary to Foley catheter. Stomach/Bowel: Stomach is unremarkable. Status post appendectomy. There is no evidence of bowel obstruction or inflammation. Vascular/Lymphatic: Aortic atherosclerosis. No enlarged abdominal or pelvic lymph nodes. Reproductive: Uterus and bilateral adnexa are unremarkable. Other: No abdominal wall hernia or abnormality. No abdominopelvic ascites. Musculoskeletal: No acute or significant osseous findings. IMPRESSION: Grossly stable 2.9 cm left adrenal nodule. Further evaluation with nonemergent adrenal MRI or CT scan is recommended. No acute abnormality seen in the abdomen  or pelvis. Aortic Atherosclerosis (ICD10-I70.0). Electronically Signed   By: Marijo Conception M.D.   On: 05/10/2022 14:51   CT HEAD WO CONTRAST (5MM)  Result Date: 05/10/2022 CLINICAL DATA:  Provided history: Mental status change, unknown cause. Unresponsive. EXAM: CT HEAD WITHOUT CONTRAST TECHNIQUE: Contiguous axial images were obtained from the base of the skull through the vertex without intravenous contrast. RADIATION DOSE REDUCTION: This exam was performed according to the departmental dose-optimization program which includes automated exposure control, adjustment of the mA and/or kV according to patient size and/or use of iterative reconstruction technique. COMPARISON:  Head CT 03/15/2016. FINDINGS: Brain: No age advanced or lobar predominant parenchymal atrophy. Redemonstrated focus of chronic encephalomalacia/gliosis within the anteroinferior left frontal lobe, likely posttraumatic in etiology. There is no acute intracranial hemorrhage. No  acute demarcated cortical infarct. No extra-axial fluid collection. No evidence of an intracranial mass. No midline shift. Vascular: No hyperdense vessel. Skull: No fracture or aggressive osseous lesion. Sinuses/Orbits: No mass or acute finding within the imaged orbits. No significant paranasal sinus disease. IMPRESSION: No evidence of acute intracranial abnormality. Redemonstrated focus of chronic encephalomalacia/gliosis within the anteroinferior left frontal lobe, which is likely posttraumatic in etiology. Electronically Signed   By: Kellie Simmering D.O.   On: 05/10/2022 09:03   DG Chest Portable 1 View  Result Date: 05/10/2022 CLINICAL DATA:  Patient found unresponsive. EXAM: PORTABLE CHEST 1 VIEW COMPARISON:  Chest radiograph 01/06/2022 FINDINGS: The cardiomediastinal silhouette is normal There is no focal consolidation or pulmonary edema. There is no pleural effusion or pneumothorax There is no acute osseous abnormality. IMPRESSION: No radiographic evidence of acute cardiopulmonary process. Electronically Signed   By: Valetta Mole M.D.   On: 05/10/2022 08:52    Microbiology: Results for orders placed or performed during the hospital encounter of 05/10/22  Blood culture (routine x 2)     Status: None   Collection Time: 05/10/22  8:06 AM   Specimen: BLOOD  Result Value Ref Range Status   Specimen Description BLOOD LEFT AC  Final   Special Requests   Final    BOTTLES DRAWN AEROBIC AND ANAEROBIC Blood Culture results may not be optimal due to an excessive volume of blood received in culture bottles   Culture   Final    NO GROWTH 5 DAYS Performed at [facility], [address], [place], [place] [address]    Report Status 05/15/2022 FINAL  Final  Blood culture (routine x 2)     Status: None   Collection Time: 05/10/22  8:33 AM   Specimen: BLOOD  Result Value Ref Range Status   Specimen Description BLOOD RIGHT UPPER ARM  Final   Special Requests   Final    BOTTLES DRAWN AEROBIC AND  ANAEROBIC Blood Culture results may not be optimal due to an inadequate volume of blood received in culture bottles   Culture   Final    NO GROWTH 5 DAYS Performed at [facility], [address] [place], [place] [address]    Report Status 05/15/2022 FINAL  Final  Resp panel by RT-PCR (RSV, Flu A&B, Covid) Urine, Catheterized     Status: None   Collection Time: 05/10/22  8:34 AM   Specimen: Urine, Catheterized; Nasal Swab  Result Value Ref Range Status   SARS Coronavirus 2 by RT PCR NEGATIVE NEGATIVE Final   Influenza A by PCR NEGATIVE NEGATIVE Final   Influenza B by PCR NEGATIVE NEGATIVE Final   Resp Syncytial Virus by PCR NEGATIVE NEGATIVE  Final    Comment: Performed at [facility], [address] [place], [place] [address]  MRSA Next Gen by PCR, Nasal     Status: None   Collection Time: 05/10/22  5:38 PM   Specimen: Nasal Mucosa; Nasal Swab  Result Value Ref Range Status   MRSA by PCR Next Gen NOT DETECTED NOT DETECTED Final    Comment: (NOTE) The GeneXpert MRSA Assay (FDA approved for NASAL specimens only), is one component of a comprehensive MRSA colonization surveillance program. It is not intended to diagnose MRSA infection nor to guide or monitor treatment for MRSA infections. Test performance is not FDA approved in patients less than 48 years old. Performed at [facility], [address] [place], [place] [address]     Labs: CBC: Recent Labs  Lab 05/10/22 [phone] 05/10/22 1158 05/11/22 0856 05/12/22 0459 05/13/22 0418 05/14/22 0507  WBC 32.7* 40.7* 19.4* 11.8* 10.9* 10.5  NEUTROABS 25.4* 34.2*  --   --   --   --   HGB 12.4 11.3* 10.4* 9.5* 10.6* 10.1*  HCT 43.8 38.4 29.8* 29.3* 32.5* 30.8*  MCV 115.6* 112.0* 92.0 98.3 98.5 98.1  PLT 361 304 227 135* 105* 94*   Basic Metabolic Panel: Recent Labs  Lab 05/10/22 1148 05/10/22 1342 05/11/22 0518 05/11/22 0856 05/11/22 1745 05/12/22 0459 05/13/22 0418 05/14/22 0507  05/15/22 0625  NA 135   < > 148*   < > 146* 148* 143 143 139  K >7.5*   < > 3.1*   < > 4.1 3.8 4.1 3.1* 4.5  CL 98   < > 113*   < > 109 113* 113* 112* 106  CO2 <7*   < > 25   < > '28 28 25 28 26  '$ GLUCOSE 1,135*   < > 135*   < > 216* 174* 197* 78 121*  BUN 29*   < > 25*   < > 24* 21* 17 15 53*  CREATININE 2.28*   < > 1.30*   < > 1.16* 1.06* 0.80 0.72 1.36*  CALCIUM 7.9*   < > 7.0*   < > 7.6* 7.9* 8.0* 7.6* 8.6*  MG 2.4  --  1.7  --   --  2.4  --  2.0 2.3  PHOS 9.7*  --  2.2*  --  3.3 2.4* 1.9* 2.1* 4.0   < > = values in this interval not displayed.   Liver Function Tests: Recent Labs  Lab 05/10/22 0928 05/10/22 1148 05/10/22 1342  AST 32 33 38  ALT '15 20 21  '$ ALKPHOS 92 89 100  BILITOT 2.1* 2.9* 2.8*  PROT 5.5* 5.5* 5.8*  ALBUMIN 2.9* 2.8* 3.0*   CBG: Recent Labs  Lab 05/14/22 1709 05/14/22 2106 05/15/22 0746 05/15/22 1146 05/15/22 1646  GLUCAP 258* 244* 135* 191* 207*    Discharge time spent: greater than 30 minutes.  Signed: Loletha Grayer, MD [facility] 05/15/2022

## 2022-05-15 NOTE — TOC Initial Note (Addendum)
Transition of Care [facility]) - Initial/Assessment Note    Patient Details  Name: Tanya Graves MRN: [id number] Date of Birth: 1965/10/30  Transition of Care [facility]) CM/SW Contact:    Beverly Sessions, RN Phone Number: 05/15/2022, 2:59 PM  Clinical Narrative:                  Admitted for: DKA Admitted from: home. Sister will be staying with her after discharge PCP: Diona Browner Current home health/prior home health/DME: Kasandra Knudsen  Therapy recommending home health. Patient would like home health services at discharge.  Patient states she does not have a preference of home health agency. Referral made to [facility] with [facility]   Family to transport at discharge   Update: Patient to discharge today.  Corene Cornea with [facility] notified        Patient Goals and CMS Choice            Expected Discharge Plan and Services                                                Prior Living Arrangements/Services                       Activities of Daily Living      Permission Sought/Granted                  Emotional Assessment              Admission diagnosis:  DKA (diabetic ketoacidosis) ([place]) [E11.10] Hypothermia, initial encounter [T68.XXXA] Diabetic ketoacidosis with coma associated with type 2 diabetes mellitus ([place]) [E11.11] Sepsis, due to unspecified organism, unspecified whether acute organ dysfunction present [facility]) [A41.9] Patient Active Problem List   Diagnosis Date Noted   Closed fracture of left proximal humerus 05/13/2022   DKA (diabetic ketoacidosis) ([place]) 05/10/2022   Hypothermia 05/10/2022   Lactic acid acidosis 05/10/2022   Hypovolemic shock ([place]) 05/10/2022   Constipation 04/29/2022   Acute cough 04/05/2022   Wheezing 04/05/2022   Acute non-recurrent maxillary sinusitis 04/05/2022   Mass of right adrenal gland ([place]) 03/26/2022   Candidal intertrigo 03/26/2022   AKI (acute kidney injury) ([place]) 01/06/2022   ASCUS  with positive high risk HPV cervical 10/29/2021   OSA (obstructive sleep apnea) 08/27/2021   Tobacco use 07/22/2018   Hypothyroidism 07/22/2018   Sepsis ([place]) 01/22/2015   Type 1 diabetes mellitus with complications ([place]) 47/65/4650   PCP:  [name], MD Pharmacy:   [facility] [id number] [address], [place] [address] [place] [address] Phone: [phone] Fax: [phone]  [id number] [place] Rab[place], [place] - [address] [place] [address] Phone: [phone] Fax: [phone]     Social Determinants of Health (SDOH) Social History: SDOH Screenings   Food Insecurity: No Food Insecurity (01/09/2022)  Transportation Needs: No Transportation Needs (01/09/2022)  Depression (PHQ2-9): Low Risk  (08/27/2021)  Financial Resource Strain: Low Risk  (07/22/2018)  Tobacco Use: High Risk (04/05/2022)   SDOH Interventions:     Readmission Risk Interventions     No data to display

## 2022-05-15 NOTE — Consult Note (Signed)
[place] for Electrolyte Monitoring and Replacement   Recent Labs: Potassium (mmol/L)  Date Value  05/15/2022 4.5  06/30/2014 4.6   Magnesium (mg/dL)  Date Value  05/15/2022 2.3   Calcium (mg/dL)  Date Value  05/15/2022 8.6 (L)   Calcium, Total (mg/dL)  Date Value  06/30/2014 9.1   Albumin (g/dL)  Date Value  05/10/2022 3.0 (L)  02/12/2014 3.7   Phosphorus (mg/dL)  Date Value  05/15/2022 4.0   Sodium (mmol/L)  Date Value  05/15/2022 139  06/30/2014 135 (L)   Assessment: 56 y.o. female with a history of diabetes and asthma who presented to the ED on 12/15 unresponsive. Her daughter found her minimally responsive this morning. She has a history of diabetes. Blood sugar was reading high. Patient was started on insulin drip and levophed d/t hyperglycemia and being hypotensive upon arrival. Pharmacy will monitor and replace electrolytes while under PCCM care.  Goal of Therapy:  Electrolytes within normal limits  Plan:  no electrolyte replacement warranted for today Because this consult was generated as part of an ICU order set and patient is transferring pharmacy will sign off for now Please feel free to reach out if any further assistance is needed   Dallie Piles 05/15/2022

## 2022-05-15 NOTE — Progress Notes (Signed)
Pt provided discharge instructions and education. IV removed no complications noted. All questions and concerns addressed at this time.

## 2022-05-15 NOTE — Progress Notes (Signed)
Occupational Therapy Treatment Patient Details Name: RANYIA WITTING MRN: [id number] DOB: [birth year] Today's Date: 05/15/2022   History of present illness 56 y/o female presented to ED on 05/10/22 after being found unresponsive. Found to be in diabetic ketoacidosis. Intubated 12/15-12/16. Also found L proximal humerus fx with ortho treating conservatively currently. PMH: T2DM, asthma   OT comments  Ms Kiker was seen for OT treatment on this date. Upon arrival to room pt reclined in bed, agreeable to tx. Pt requires CGA for toilet t/f, pericare, and grooming standing sink side. Fair standing balance however intermittently furniture walks, educated on use of cane for safety. Pt educated on sling use, HEP, adapted dressing strategies, and edema mgmt. Pt making good progress toward goals, will continue to follow POC. Discharge recommendation remains appropriate.     Recommendations for follow up therapy are one component of a multi-disciplinary discharge planning process, led by the attending physician.  Recommendations may be updated based on patient status, additional functional criteria and insurance authorization.    Follow Up Recommendations  Home health OT     Assistance Recommended at Discharge Intermittent Supervision/Assistance  Patient can return home with the following  A little help with walking and/or transfers;A little help with bathing/dressing/bathroom;Assistance with cooking/housework;Assist for transportation;Help with stairs or ramp for entrance   Equipment Recommendations  None recommended by OT    Recommendations for Other Services      Precautions / Restrictions Precautions Precautions: Fall Required Braces or Orthoses: Sling Restrictions Weight Bearing Restrictions: Yes LUE Weight Bearing: Non weight bearing       Mobility Bed Mobility Overal bed mobility: Needs Assistance Bed Mobility: Sit to Supine     Supine to sit: Supervision     General bed  mobility comments: from flat bed, uses momentum    Transfers Overall transfer level: Needs assistance Equipment used: None Transfers: Sit to/from Stand Sit to Stand: Min guard           General transfer comment: intermittently reches out for single UE support     Balance Overall balance assessment: Needs assistance Sitting-balance support: No upper extremity supported, Feet supported Sitting balance-Leahy Scale: Good     Standing balance support: No upper extremity supported, During functional activity Standing balance-Leahy Scale: Fair                             ADL either performed or assessed with clinical judgement   ADL Overall ADL's : Needs assistance/impaired                                       General ADL Comments: CGA for toilet t/f, pericare, and grooming standing sink side.      Cognition Arousal/Alertness: Awake/alert Behavior During Therapy: WFL for tasks assessed/performed Overall Cognitive Status: Within Functional Limits for tasks assessed                                          Exercises Other Exercises Other Exercises: Educated on sling use, HEP, adapted dressing strategies, and edema mgmt            Pertinent Vitals/ Pain       Pain Assessment Pain Assessment: 0-10 Pain Score: 7  Pain Location: L UE with movement and  touch Pain Descriptors / Indicators: Grimacing, Guarding, Aching Pain Intervention(s): Limited activity within patient's tolerance, Repositioned   Frequency  Min 2X/week        Progress Toward Goals  OT Goals(current goals can now be found in the care plan section)  Progress towards OT goals: Progressing toward goals  Acute Rehab OT Goals Patient Stated Goal: to go home OT Goal Formulation: With patient Time For Goal Achievement: 05/28/22 Potential to Achieve Goals: Good ADL Goals Pt Will Perform Grooming: standing;with min assist Pt Will Perform Upper Body  Dressing: with modified independence;sitting;standing Pt Will Transfer to Toilet: with min guard assist;regular height toilet;ambulating  Plan Discharge plan remains appropriate;Frequency remains appropriate    Co-evaluation                 AM-PAC OT "6 Clicks" Daily Activity     Outcome Measure   Help from another person eating meals?: None Help from another person taking care of personal grooming?: None Help from another person toileting, which includes using toliet, bedpan, or urinal?: A Little Help from another person bathing (including washing, rinsing, drying)?: A Little Help from another person to put on and taking off regular upper body clothing?: A Lot Help from another person to put on and taking off regular lower body clothing?: A Little 6 Click Score: 19    End of Session    OT Visit Diagnosis: Unsteadiness on feet (R26.81);Muscle weakness (generalized) (M62.81)   Activity Tolerance Patient tolerated treatment well   Patient Left with call bell/phone within reach;in chair   Nurse Communication          Time: 8185-6314 OT Time Calculation (min): 49 min  Charges: OT General Charges $OT Visit: 1 Visit OT Treatments $Self Care/Home Management : 38-52 mins  Dessie Coma, M.S. OTR/L  05/15/22, 12:26 PM  ascom [phone]

## 2022-05-15 NOTE — Progress Notes (Signed)
Physical Therapy Treatment Patient Details Name: Tanya Graves MRN: [id number] DOB: 04/09/1966 Today's Date: 05/15/2022   History of Present Illness 56 y/o female presented to ED on 05/10/22 after being found unresponsive. Found to be in diabetic ketoacidosis. Intubated 12/15-12/16. Also found L proximal humerus fx with ortho treating conservatively currently. PMH: T2DM, asthma    PT Comments    Pt progressing well performing stair negotiation (8 steps rail in R ) at min guard level, transfers at modI, and gait with SPC at supervision level for balance( 150'). Current d/c plan is appropriate.   Recommendations for follow up therapy are one component of a multi-disciplinary discharge planning process, led by the attending physician.  Recommendations may be updated based on patient status, additional functional criteria and insurance authorization.  Follow Up Recommendations  Home health PT     Assistance Recommended at Discharge Set up Supervision/Assistance  Patient can return home with the following A little help with walking and/or transfers;A little help with bathing/dressing/bathroom;Assistance with cooking/housework;Assist for transportation;Help with stairs or ramp for entrance   Equipment Recommendations  None recommended by PT    Recommendations for Other Services       Precautions / Restrictions Precautions Precautions: Fall Required Braces or Orthoses: Sling Restrictions Weight Bearing Restrictions: Yes LUE Weight Bearing: Weight bearing as tolerated     Mobility  Bed Mobility               General bed mobility comments: Pt OOB    Transfers Overall transfer level: Modified independent Equipment used: None Transfers: Sit to/from Stand     Step pivot transfers: Modified independent (Device/Increase time)            Ambulation/Gait Ambulation/Gait assistance: Supervision Gait Distance (Feet): 150 Feet Assistive device: Straight cane Gait  Pattern/deviations: Step-through pattern Gait velocity: decreased     General Gait Details: Standing rest breaks during amb. around nursing station, pt reporting fatigue and wanting to get her bearings.   Stairs Stairs: Yes Stairs assistance: Min guard Stair Management: One rail Right, Step to pattern, Sideways Number of Stairs: 8 General stair comments: pt was steady with stair negotiation.   Wheelchair Mobility    Modified Rankin (Stroke Patients Only)       Balance Overall balance assessment: Needs assistance Sitting-balance support: No upper extremity supported, Feet supported Sitting balance-Leahy Scale: Good     Standing balance support: No upper extremity supported, During functional activity Standing balance-Leahy Scale: Fair Standing balance comment: No major LOB, pt self corrected small imbalances during gait.                            Cognition Arousal/Alertness: Awake/alert Behavior During Therapy: WFL for tasks assessed/performed Overall Cognitive Status: Within Functional Limits for tasks assessed                                          Exercises      General Comments        Pertinent Vitals/Pain Pain Assessment Pain Assessment: 0-10 Pain Score: 7  Pain Location: L UE ch Pain Descriptors / Indicators: Constant, Throbbing Pain Intervention(s): Monitored during session    Home Living                          Prior Function  PT Goals (current goals can now be found in the care plan section) Acute Rehab PT Goals Patient Stated Goal: to get stronger PT Goal Formulation: With patient Time For Goal Achievement: 05/28/22 Potential to Achieve Goals: Good Progress towards PT goals: Progressing toward goals    Frequency    Min 2X/week      PT Plan Current plan remains appropriate    Co-evaluation              AM-PAC PT "6 Clicks" Mobility   Outcome Measure  Help needed  turning from your back to your side while in a flat bed without using bedrails?: A Little Help needed moving from lying on your back to sitting on the side of a flat bed without using bedrails?: A Little Help needed moving to and from a bed to a chair (including a wheelchair)?: None Help needed standing up from a chair using your arms (e.g., wheelchair or bedside chair)?: None Help needed to walk in hospital room?: A Little Help needed climbing 3-5 steps with a railing? : A Little 6 Click Score: 20    End of Session Equipment Utilized During Treatment: Gait belt Activity Tolerance: Patient tolerated treatment well Patient left: in chair;with call bell/phone within reach;with family/visitor present Nurse Communication: Mobility status PT Visit Diagnosis: Unsteadiness on feet (R26.81);Muscle weakness (generalized) (M62.81);Difficulty in walking, not elsewhere classified (R26.2)     Time: 8638-1771 PT Time Calculation (min) (ACUTE ONLY): 24 min  Charges:  $Gait Training: 8-22 mins $Therapeutic Activity: 8-22 mins                     Bjorn Loser, PTA  05/15/22, 12:45 PM

## 2022-05-15 NOTE — Plan of Care (Signed)
  Problem: Activity: Goal: Ability to tolerate increased activity will improve Outcome: Adequate for Discharge   Problem: Respiratory: Goal: Ability to maintain a clear airway and adequate ventilation will improve Outcome: Adequate for Discharge   Problem: Role Relationship: Goal: Method of communication will improve Outcome: Adequate for Discharge   Problem: Education: Goal: Knowledge of General Education information will improve Description: Including pain rating scale, medication(s)/side effects and non-pharmacologic comfort measures Outcome: Adequate for Discharge   Problem: Health Behavior/Discharge Planning: Goal: Ability to manage health-related needs will improve Outcome: Adequate for Discharge   Problem: Clinical Measurements: Goal: Ability to maintain clinical measurements within normal limits will improve Outcome: Adequate for Discharge Goal: Will remain free from infection Outcome: Adequate for Discharge Goal: Diagnostic test results will improve Outcome: Adequate for Discharge Goal: Respiratory complications will improve Outcome: Adequate for Discharge Goal: Cardiovascular complication will be avoided Outcome: Adequate for Discharge   Problem: Activity: Goal: Risk for activity intolerance will decrease Outcome: Adequate for Discharge   Problem: Nutrition: Goal: Adequate nutrition will be maintained Outcome: Adequate for Discharge   Problem: Coping: Goal: Level of anxiety will decrease Outcome: Adequate for Discharge   Problem: Elimination: Goal: Will not experience complications related to bowel motility Outcome: Adequate for Discharge Goal: Will not experience complications related to urinary retention Outcome: Adequate for Discharge   Problem: Pain Managment: Goal: General experience of comfort will improve Outcome: Adequate for Discharge   Problem: Safety: Goal: Ability to remain free from injury will improve Outcome: Adequate for Discharge    Problem: Skin Integrity: Goal: Risk for impaired skin integrity will decrease Outcome: Adequate for Discharge   Problem: Education: Goal: Ability to describe self-care measures that may prevent or decrease complications (Diabetes Survival Skills Education) will improve Outcome: Adequate for Discharge Goal: Individualized Educational Video(s) Outcome: Adequate for Discharge   Problem: Coping: Goal: Ability to adjust to condition or change in health will improve Outcome: Adequate for Discharge   Problem: Fluid Volume: Goal: Ability to maintain a balanced intake and output will improve Outcome: Adequate for Discharge   Problem: Health Behavior/Discharge Planning: Goal: Ability to identify and utilize available resources and services will improve Outcome: Adequate for Discharge Goal: Ability to manage health-related needs will improve Outcome: Adequate for Discharge   Problem: Metabolic: Goal: Ability to maintain appropriate glucose levels will improve Outcome: Adequate for Discharge   Problem: Nutritional: Goal: Maintenance of adequate nutrition will improve Outcome: Adequate for Discharge Goal: Progress toward achieving an optimal weight will improve Outcome: Adequate for Discharge   Problem: Skin Integrity: Goal: Risk for impaired skin integrity will decrease Outcome: Adequate for Discharge   Problem: Tissue Perfusion: Goal: Adequacy of tissue perfusion will improve Outcome: Adequate for Discharge

## 2022-05-15 NOTE — Inpatient Diabetes Management (Addendum)
Inpatient Diabetes Program Recommendations  AACE/ADA: New Consensus Statement on Inpatient Glycemic Control (2015)  Target Ranges:  Prepandial:   less than 140 mg/dL      Peak postprandial:   less than 180 mg/dL (1-2 hours)      Critically ill patients:  140 - 180 mg/dL    Latest Reference Range & Units 02/14/22 16:00  Hemoglobin A1C 4.8 - 5.6 % 12.7 (H)  318 mg/dl  (H): Data is abnormally high  Latest Reference Range & Units 05/14/22 08:01 05/14/22 11:42 05/14/22 17:09 05/14/22 21:06  Glucose-Capillary 70 - 99 mg/dL 68 (L) 155 (H)  3 units Novolog  10 units Semglee  258 (H)  8 units Novolog  244 (H)  2 units Novolog  10 units Semglee   (L): Data is abnormally low (H): Data is abnormally high  Latest Reference Range & Units 05/15/22 07:46  Glucose-Capillary 70 - 99 mg/dL 135 (H)  (H): Data is abnormally high  Latest Reference Range & Units 02/25/22 12:06  Glutamic Acid Decarb Ab <5 IU/mL >250 (H)  (H): Data is abnormally high  Admit:  Severe DKA Potassium of 7.4 Serum bicarbonate less than 7  Glucose 1070    Home DM Meds: Novolog Inject Max of 60 units Daily     Toujeo 24 units Daily     Mounjaro 7.5 mg Qweek   Current Orders: Semglee 10 units BID  Novolog 0-15 units TID ac/hs     Mild HYPO yest AM so Semglee reduced to 10 BID (was 15 BID)   MD- May consider adding low dose Novolog Meal Coverage:  Novolog 3 units TID with meals (supposed to be taking 18 units TID with meals at home) HOLD if pt NPO HOLD if pt eats <50% meals    Addendum 12pm--Met w/ pt at bedside to discuss current admission (DKA), home regimen, etc.  Pt told me she last saw her ENDO in Oct 2023 and was told she has converted to Type 1 diabetes (GAD antibody level was >250).  Discussed with patient diagnosis of DKA (pathophysiology), treatment of DKA, lab results, and transition plan to SQ insulin regimen.  Reviewed with pt the difference between Type 1 and Type 2 diabetes and how she is  at higher risk for DKA given she has Type 1 diabetes per antibody results.  We talked about the importance of taking the long-acting insulin Toujeo daily and NEVER missing the Toujeo doses.  We also reviewed her home Novolog regimen.  Pt told me she wants to go back on her Dexcom CGM--Encouraged pt to talk with her ENDO about changing her diagnosis to Type 1 diabetes as she may have better insurance coverage with the Dexcom being Type 1 diabetic.  Pt does have traditional CBG meter at home and all insulin and supplies.  All questions answered and pt appreciative of visit.  Pt waiting to work with pt when I left the room.     ENDO: Dr. Kelton Pillar with [facility] Last Seen 02/25/2022 Per MD notes: "Dexcom has become cost prohibitive, and freestyle Elenor Legato has been inadequate" Was told to take the following:  Increase Mounjaro 7.5 mg weekly  Increase Toujeo 26 units daily  Change NovoLog 18 units 3 times daily before every meal Take NovoLog 8 units with a snack Continue CF : Novolog (BG-130/25)       --Will follow patient during hospitalization--  Wyn Quaker RN, MSN, [place] Diabetes Coordinator Inpatient Glycemic Control Team Team Pager: [phone] (8a-5p)

## 2022-05-16 ENCOUNTER — Telehealth: Payer: Self-pay | Admitting: *Deleted

## 2022-05-16 ENCOUNTER — Telehealth: Payer: Self-pay | Admitting: Family Medicine

## 2022-05-16 MED ORDER — FUROSEMIDE 20 MG PO TABS: 20.0000 mg | ORAL_TABLET | Freq: Every day | ORAL | 0 refills | Status: DC

## 2022-05-16 NOTE — Progress Notes (Signed)
Called pharmacy: Novolog  went was approved Pharmacist will also do a once a day PPI that is covered  Dr Leslye Peer

## 2022-05-16 NOTE — Telephone Encounter (Signed)
Noted  

## 2022-05-16 NOTE — Telephone Encounter (Signed)
Tiffany with [facility] called and stated they wont be able to start orders until 05/18/2022 and was told to call office and let them know. Call back number [phone] Option 2.

## 2022-05-16 NOTE — Telephone Encounter (Signed)
Patient was diabetic EKA in critical care, almost died, in for five days, got out last night On 12.12.23 212  weighed herself her whole body is so swollen currently weighs 253  Swollen, everything hurts, skin is super tight and everything is painful.  Possibly may need a diuretic to get some of the fluid off  Perhaps from HT  Patient was told from [facility] call to  Please call [phone]

## 2022-05-16 NOTE — Telephone Encounter (Signed)
Where is her blood pressure running?  Per Caryl Pina.. pt cannot check at home.  Likely fluid overloaded from IVF.  Renal function was abnormal at discharge so we would have to be very careful with diuresis.    Wt Readings from Last 3 Encounters:  05/15/22 255 lb 11.7 oz (116 kg)  04/05/22 224 lb 8 oz (101.8 kg)  03/26/22 229 lb 2 oz (103.9 kg)   Appt made  tommorow AM. Pt will start lasix tonight and we can recheck BP, wt and renal function/K in office tommorow.

## 2022-05-16 NOTE — Patient Outreach (Signed)
  Care Coordination [facility] Note Transition Care Management Unsuccessful Follow-up Telephone Call  Date of discharge and from where:  [facility] [phone]  Attempts:  1st Attempt  Reason for unsuccessful TCM follow-up call:  Left voice message [facility] [phone]

## 2022-05-16 NOTE — Addendum Note (Signed)
Addended by: Eliezer Lofts E on: 05/16/2022 04:46 PM   Modules accepted: Orders

## 2022-05-16 NOTE — Patient Outreach (Signed)
  Care Coordination [facility] Note Transition Care Management Follow-up Telephone Call Date of discharge and from where: [facility] [phone] How have you been since you were released from the hospital? Feeling a little stronger  Any questions or concerns?Yes Yes Patient stated that she is swollen all over her entire body. Her weight is 253 today. Patient weight was 212 before admission. RN instructed patient to call Dr Diona Browner office aware now and make her aware. Patient is not having any shortness of breath or coughing.  Items Reviewed: Did the pt receive and understand the discharge instructions provided? Yes  Medications obtained and verified? Yes  Other? No  Any new allergies since your discharge? Yes  Dietary orders reviewed? No Do you have support at home? Yes   Home Care and Equipment/Supplies: Were home health services ordered? yes If so, what is the name of the agency? [facility]  Has the agency set up a time to come to the patient's home? No RN gave patient number to follow up Were any new equipment or medical supplies ordered?  No What is the name of the medical supply agency? N/a Were you able to get the supplies/equipment? no Do you have any questions related to the use of the equipment or supplies? No  Functional Questionnaire: (I = Independent and D = Dependent) ADLs: D  Bathing/Dressing- I  Meal Prep- D  Eating- I  Maintaining continence- I  Transferring/Ambulation- D  Managing Meds- I  Follow up appointments reviewed:  PCP Hospital f/u appt confirmed? Yes  Dr Diona Browner [phone] 8:20. [facility] f/u appt confirmed? N Are transportation arrangements needed? No  If their condition worsens, is the pt aware to call PCP or go to the Emergency Dept.? Yes Was the patient provided with contact information for the PCP's office or ED? Yes Was to pt encouraged to call back with questions or concerns? Yes  SDOH assessments and interventions completed:   Yes SDOH  Interventions Today    Flowsheet Row Most Recent Value  SDOH Interventions   Food Insecurity Interventions Intervention Not Indicated  Housing Interventions Intervention Not Indicated  Transportation Interventions Intervention Not Indicated       Care Coordination Interventions:  Referred for Care Coordination Services:  RN Care Coordinator Quinn Plowman [phone] 10:00 AM. Patient will called Dr Diona Browner office regarding swelling. Patient has been given 3 different times for endocrinologist. RN called to confirm correct time.     Encounter Outcome:  Pt. Visit Completed  RN contacted endocrinology and appt is [phone] at 3:00 PM   [facility] [phone]

## 2022-05-16 NOTE — Telephone Encounter (Signed)
Patient called and stated she would like to talk about an issue she had with leaving the hospital yesterday. Call back number [phone]

## 2022-05-16 NOTE — Telephone Encounter (Signed)
Spoke with pt reports that she was release from hospital 05/15/22.  Was admitted 05/10/22 for DKA and humerus fracture.  Was unresponsive when arrived to ED and intubated.  Pt received IV fluids and is now experiencing generalized edema.  She reports that her weight is up 40 lbs from her last visit with you.  She is wondering if a diuretic could be prescribed to help with her edema.  Pt reports that she is urinating and has gone 3 times since getting up this morning and her urine is pale yellow.  Pt has appointment with you1/4/24 @ 8:20a and [place] are starting on Saturday 05/19/23.

## 2022-05-16 NOTE — Telephone Encounter (Signed)
LMTCB

## 2022-05-17 ENCOUNTER — Ambulatory Visit (INDEPENDENT_AMBULATORY_CARE_PROVIDER_SITE_OTHER): Payer: BC Managed Care – PPO | Admitting: Family Medicine

## 2022-05-17 ENCOUNTER — Other Ambulatory Visit: Payer: Self-pay | Admitting: Family Medicine

## 2022-05-17 ENCOUNTER — Encounter: Payer: Self-pay | Admitting: Family Medicine

## 2022-05-17 VITALS — BP 140/84 | HR 83 | Temp 98.3°F | Ht 68.0 in | Wt 250.4 lb

## 2022-05-17 DIAGNOSIS — E108 Type 1 diabetes mellitus with unspecified complications: Secondary | ICD-10-CM

## 2022-05-17 DIAGNOSIS — N179 Acute kidney failure, unspecified: Secondary | ICD-10-CM | POA: Diagnosis not present

## 2022-05-17 DIAGNOSIS — E8770 Fluid overload, unspecified: Secondary | ICD-10-CM | POA: Insufficient documentation

## 2022-05-17 DIAGNOSIS — E8779 Other fluid overload: Secondary | ICD-10-CM | POA: Diagnosis not present

## 2022-05-17 DIAGNOSIS — G9341 Metabolic encephalopathy: Secondary | ICD-10-CM | POA: Diagnosis not present

## 2022-05-17 DIAGNOSIS — S42295A Other nondisplaced fracture of upper end of left humerus, initial encounter for closed fracture: Secondary | ICD-10-CM

## 2022-05-17 LAB — BASIC METABOLIC PANEL
BUN: 9 mg/dL (ref 6–23)
CO2: 31 mEq/L (ref 19–32)
Calcium: 8.2 mg/dL — ABNORMAL LOW (ref 8.4–10.5)
Chloride: 102 mEq/L (ref 96–112)
Creatinine, Ser: 0.77 mg/dL (ref 0.40–1.20)
GFR: 86.87 mL/min (ref >60.00)
Glucose, Bld: 339 mg/dL — ABNORMAL HIGH (ref 70–99)
Potassium: 4.3 mEq/L (ref 3.5–5.1)
Sodium: 139 mEq/L (ref 135–145)

## 2022-05-17 MED ORDER — FUROSEMIDE 20 MG PO TABS: 20.0000 mg | ORAL_TABLET | Freq: Every day | ORAL | 0 refills | Status: DC

## 2022-05-17 NOTE — Assessment & Plan Note (Signed)
Pending follow-up with Ortho to reevaluate left shoulder.  Currently wearing sling.  Using Tylenol for pain.

## 2022-05-17 NOTE — Assessment & Plan Note (Signed)
Acute status post heavy IV fluids due to dehydration in DKA.  She has had significant response to 1 dose of Lasix.  We will have her follow her blood pressure to make sure she is not dropping low and I will recheck her potassium and kidney function today.  We will likely plan a full 6 days of Lasix 20 mg daily to fully diurese.

## 2022-05-17 NOTE — Progress Notes (Signed)
.   Patient ID: Tanya Graves, female    DOB: 10/31/1965, 56 y.o.   MRN: [id number]  This visit was conducted in person.  BP (!) 140/84   Pulse 83   Temp 98.3 F (36.8 C) (Oral)   Ht '5\' 8"'$  (1.727 m)   Wt 250 lb 6 oz (113.6 kg)   LMP 06/28/2015 (Approximate)   SpO2 97%   BMI 38.07 kg/m    CC:  Chief Complaint  Patient presents with   Edema    Subjective:   HPI: Tanya Graves is a 56 y.o. female presenting on 05/17/2022 for Edema  Recent hospitalization for DKA ., with acute renal injury.  Was found unresponsive. Admitted  05/10/2022 to 05/14/2022 She was started on Levophed for hypotension and bicarb drip.  She required intubation on 05/11/2019 3 in the afternoon.  Patient was also found to have a left proximal humerus fracture and seen by Dr. Karel Jarvis who recomended immobilization.  The patient was extubated on 05/13/2022.   Creatinine peaked at 2.39 and went as low as 0.72. Creatinine upon discharge 1.36. Given fluid bolus prior to disposition.  Has  scheduled hospital follow-up January 4.  For reevaluation with labs.    Since she has been home she has noted severe swelling.  Likely secondary to IV fluids. She was given 1 dose of lasix 20 mg last night.   Today she reports  she has gained 50 lbs in hospitalization... noted swelling in bilateral legs up to thighs and in arms, hands... hand on left worse than right.     She has made an appt to see a therapist given she had such a traumatic event, " almost died". Was having hallucination in hospital.. none further   She is using tylenol for pain in shoulder.   FBS 122 followed by Dr. Kelton Pillar... has follow up 07/22/21  She is planning to start pump and get Dexacom to follow blood sugars. Tuojeo 17 units daily ON SSI Novolog  On Mounjaro 7.5 mg weekly   Has Home Health coming out.  Follow up with ORTHO..planning to set up.   Wt Readings from Last 3 Encounters:  05/17/22 250 lb 6 oz (113.6 kg)  05/15/22 255 lb  11.7 oz (116 kg)  04/05/22 224 lb 8 oz (101.8 kg)   BP Readings from Last 3 Encounters:  05/17/22 (!) 140/84  05/15/22 (!) 147/81  04/05/22 (!) 144/90     Relevant past medical, surgical, family and social history reviewed and updated as indicated. Interim medical history since our last visit reviewed. Allergies and medications reviewed and updated. Outpatient Medications Prior to Visit  Medication Sig Dispense Refill   albuterol (VENTOLIN HFA) 108 (90 Base) MCG/ACT inhaler Inhale 2 puffs into the lungs every 6 (six) hours as needed for wheezing or shortness of breath. 8 g 0   aspirin 81 MG chewable tablet Chew 1 tablet (81 mg total) by mouth daily. 30 tablet 0   atorvastatin (LIPITOR) 20 MG tablet Take 4 tablets (80 mg total) by mouth daily. 90 tablet 3   cephALEXin (KEFLEX) 500 MG capsule Take 1 capsule (500 mg total) by mouth every 12 (twelve) hours for 5 doses. 5 capsule 0   estradiol (ESTRACE) 0.1 MG/GM vaginal cream Place 1 Applicatorful vaginally at bedtime.     folic acid (FOLVITE) 1 MG tablet Take 1 tablet (1 mg total) by mouth daily. 30 tablet 0   furosemide (LASIX) 20 MG tablet Take 1 tablet (20 mg total)  by mouth daily. 3 tablet 0   insulin aspart (NOVOLOG) 100 UNIT/ML injection Inject 18 Units into the skin 3 (three) times daily with meals. 20 mL 0   insulin glargine, 1 Unit Dial, (TOUJEO SOLOSTAR) 300 UNIT/ML Solostar Pen Inject 24 Units into the skin at bedtime. 3 mL 0   levothyroxine (SYNTHROID) 175 MCG tablet Take 1 tablet (175 mcg total) by mouth daily with breakfast. 90 tablet 3   Multiple Vitamin (MULTIVITAMIN WITH MINERALS) TABS tablet Take 1 tablet by mouth daily. 30 tablet 0   pantoprazole (PROTONIX) 40 MG tablet Take 1 tablet (40 mg total) by mouth daily. 30 tablet 0   polyethylene glycol (MIRALAX / GLYCOLAX) 17 g packet Take 17 g by mouth daily as needed for moderate constipation. 14 each 0   thiamine (VITAMIN B-1) 100 MG tablet Take 1 tablet (100 mg total) by  mouth daily. 30 tablet 0   No facility-administered medications prior to visit.     Per HPI unless specifically indicated in ROS section below Review of Systems  Constitutional:  Negative for fatigue and fever.  HENT:  Negative for congestion.   Eyes:  Negative for pain.  Respiratory:  Negative for cough and shortness of breath.   Cardiovascular:  Negative for chest pain, palpitations and leg swelling.  Gastrointestinal:  Negative for abdominal pain.  Genitourinary:  Negative for dysuria and vaginal bleeding.  Musculoskeletal:  Negative for back pain.  Neurological:  Negative for syncope, light-headedness and headaches.  Psychiatric/Behavioral:  Negative for dysphoric mood.    Objective:  BP (!) 140/84   Pulse 83   Temp 98.3 F (36.8 C) (Oral)   Ht '5\' 8"'$  (1.727 m)   Wt 250 lb 6 oz (113.6 kg)   LMP 06/28/2015 (Approximate)   SpO2 97%   BMI 38.07 kg/m   Wt Readings from Last 3 Encounters:  05/17/22 250 lb 6 oz (113.6 kg)  05/15/22 255 lb 11.7 oz (116 kg)  04/05/22 224 lb 8 oz (101.8 kg)      Physical Exam Constitutional:      General: She is not in acute distress.    Appearance: Normal appearance. She is well-developed. She is not ill-appearing or toxic-appearing.  HENT:     Head: Normocephalic.     Right Ear: Hearing, tympanic membrane, ear canal and external ear normal. Tympanic membrane is not erythematous, retracted or bulging.     Left Ear: Hearing, tympanic membrane, ear canal and external ear normal. Tympanic membrane is not erythematous, retracted or bulging.     Nose: No mucosal edema or rhinorrhea.     Right Sinus: No maxillary sinus tenderness or frontal sinus tenderness.     Left Sinus: No maxillary sinus tenderness or frontal sinus tenderness.     Mouth/Throat:     Pharynx: Uvula midline.  Eyes:     General: Lids are normal. Lids are everted, no foreign bodies appreciated.     Conjunctiva/sclera: Conjunctivae normal.     Pupils: Pupils are equal, round,  and reactive to light.  Neck:     Thyroid: No thyroid mass or thyromegaly.     Vascular: No carotid bruit.     Trachea: Trachea normal.  Cardiovascular:     Rate and Rhythm: Normal rate and regular rhythm.     Pulses: Normal pulses.     Heart sounds: Normal heart sounds, S1 normal and S2 normal. No murmur heard.    No friction rub. No gallop.  Pulmonary:  Effort: Pulmonary effort is normal. No tachypnea or respiratory distress.     Breath sounds: Normal breath sounds. No decreased breath sounds, wheezing, rhonchi or rales.  Abdominal:     General: Bowel sounds are normal.     Palpations: Abdomen is soft.     Tenderness: There is no abdominal tenderness.  Musculoskeletal:     Cervical back: Normal range of motion and neck supple.  Skin:    General: Skin is warm and dry.     Findings: No rash.  Neurological:     Mental Status: She is alert.  Psychiatric:        Mood and Affect: Mood is not anxious or depressed.        Speech: Speech normal.        Behavior: Behavior normal. Behavior is cooperative.        Thought Content: Thought content normal.        Judgment: Judgment normal.       Results for orders placed or performed during the hospital encounter of 05/10/22  Blood culture (routine x 2)   Specimen: BLOOD  Result Value Ref Range   Specimen Description BLOOD RIGHT UPPER ARM    Special Requests      BOTTLES DRAWN AEROBIC AND ANAEROBIC Blood Culture results may not be optimal due to an inadequate volume of blood received in culture bottles   Culture      NO GROWTH 5 DAYS Performed at [facility], [address] [place], [place] [address]    Report Status 05/15/2022 FINAL   Blood culture (routine x 2)   Specimen: BLOOD  Result Value Ref Range   Specimen Description BLOOD LEFT AC    Special Requests      BOTTLES DRAWN AEROBIC AND ANAEROBIC Blood Culture results may not be optimal due to an excessive volume of blood received in culture bottles   Culture       NO GROWTH 5 DAYS Performed at [facility], [address] [place], [place] [address]    Report Status 05/15/2022 FINAL   Resp panel by RT-PCR (RSV, Flu A&B, Covid) Urine, Catheterized   Specimen: Urine, Catheterized; Nasal Swab  Result Value Ref Range   SARS Coronavirus 2 by RT PCR NEGATIVE NEGATIVE   Influenza A by PCR NEGATIVE NEGATIVE   Influenza B by PCR NEGATIVE NEGATIVE   Resp Syncytial Virus by PCR NEGATIVE NEGATIVE  MRSA Next Gen by PCR, Nasal   Specimen: Nasal Mucosa; Nasal Swab  Result Value Ref Range   MRSA by PCR Next Gen NOT DETECTED NOT DETECTED  CBC with Differential  Result Value Ref Range   WBC 32.7 (H) 4.0 - 10.5 K/uL   RBC 3.79 (L) 3.87 - 5.11 MIL/uL   Hemoglobin 12.4 12.0 - 15.0 g/dL   HCT 43.8 36.0 - 46.0 %   MCV 115.6 (H) 80.0 - 100.0 fL   MCH 32.7 26.0 - 34.0 pg   MCHC 28.3 (L) 30.0 - 36.0 g/dL   RDW 12.3 11.5 - 15.5 %   Platelets 361 150 - 400 K/uL   nRBC 0.0 0.0 - 0.2 %   Neutrophils Relative % 78 %   Neutro Abs 25.4 (H) 1.7 - 7.7 K/uL   Lymphocytes Relative 12 %   Lymphs Abs 3.9 0.7 - 4.0 K/uL   Monocytes Relative 5 %   Monocytes Absolute 1.7 (H) 0.1 - 1.0 K/uL   Eosinophils Relative 0 %   Eosinophils Absolute 0.1 0.0 - 0.5  K/uL   Basophils Relative 0 %   Basophils Absolute 0.1 0.0 - 0.1 K/uL   WBC Morphology MILD LEFT SHIFT (1-5% METAS, OCC MYELO, OCC BANDS)    RBC Morphology MORPHOLOGY UNREMARKABLE    Smear Review Normal platelet morphology    Immature Granulocytes 5 %   Abs Immature Granulocytes 1.47 (H) 0.00 - 0.07 K/uL  Lactic acid, plasma  Result Value Ref Range   Lactic Acid, Venous 6.6 (HH) 0.5 - 1.9 mmol/L  Lactic acid, plasma  Result Value Ref Range   Lactic Acid, Venous 7.1 (HH) 0.5 - 1.9 mmol/L  Blood gas, venous  Result Value Ref Range   pH, Ven PENDING 7.25 - 7.43   pCO2, Ven <18 (LL) 44 - 60 mmHg   pO2, Ven PENDING 32 - 45 mmHg   Bicarbonate 2.9 (L) 20.0 - 28.0 mmol/L   Acid-base deficit 29.4 (H) 0.0 - 2.0  mmol/L   O2 Saturation PENDING %   Patient temperature 37.0    Collection site VEIN   Beta-hydroxybutyric acid  Result Value Ref Range   Beta-Hydroxybutyric Acid >8.00 (H) 0.05 - 0.27 mmol/L  Urinalysis, Routine w reflex microscopic Urine, Catheterized  Result Value Ref Range   Color, Urine STRAW (A) YELLOW   APPearance CLEAR (A) CLEAR   Specific Gravity, Urine 1.021 1.005 - 1.030   pH 5.0 5.0 - 8.0   Glucose, UA >=500 (A) NEGATIVE mg/dL   Hgb urine dipstick NEGATIVE NEGATIVE   Bilirubin Urine NEGATIVE NEGATIVE   Ketones, ur 80 (A) NEGATIVE mg/dL   Protein, ur 30 (A) NEGATIVE mg/dL   Nitrite NEGATIVE NEGATIVE   Leukocytes,Ua NEGATIVE NEGATIVE   WBC, UA 0-5 0 - 5 WBC/hpf   Bacteria, UA NONE SEEN NONE SEEN   Squamous Epithelial / LPF 0-5 0 - 5   Mucus PRESENT   Urine Drug Screen, Qualitative ([facility] only)  Result Value Ref Range   Tricyclic, Ur Screen NONE DETECTED NONE DETECTED   Amphetamines, Ur Screen NONE DETECTED NONE DETECTED   MDMA (Ecstasy)Ur Screen NONE DETECTED NONE DETECTED   Cocaine Metabolite,Ur [place] NONE DETECTED NONE DETECTED   Opiate, Ur Screen NONE DETECTED NONE DETECTED   Phencyclidine (PCP) Ur S NONE DETECTED NONE DETECTED   Cannabinoid 50 Ng, Ur [place] NONE DETECTED NONE DETECTED   Barbiturates, Ur Screen NONE DETECTED NONE DETECTED   Benzodiazepine, Ur Scrn NONE DETECTED NONE DETECTED   Methadone Scn, Ur NONE DETECTED NONE DETECTED  Lipase, blood  Result Value Ref Range   Lipase 23 11 - 51 U/L  Pregnancy, urine  Result Value Ref Range   Preg Test, Ur NEGATIVE NEGATIVE  Comprehensive metabolic panel  Result Value Ref Range   Sodium 135 135 - 145 mmol/L   Potassium 7.4 (HH) 3.5 - 5.1 mmol/L   Chloride 102 98 - 111 mmol/L   CO2 <7 (L) 22 - 32 mmol/L   Glucose, Bld 1,070 (HH) 70 - 99 mg/dL   BUN 27 (H) 6 - 20 mg/dL   Creatinine, Ser 2.12 (H) 0.44 - 1.00 mg/dL   Calcium 7.9 (L) 8.9 - 10.3 mg/dL   Total Protein 5.5 (L) 6.5 - 8.1 g/dL   Albumin 2.9 (L) 3.5 -  5.0 g/dL   AST 32 15 - 41 U/L   ALT 15 0 - 44 U/L   Alkaline Phosphatase 92 38 - 126 U/L   Total Bilirubin 2.1 (H) 0.3 - 1.2 mg/dL   GFR, Estimated 27 (L) >60 mL/min   Anion gap NOT  CALCULATED 5 - 15  Magnesium  Result Value Ref Range   Magnesium 2.6 (H) 1.7 - 2.4 mg/dL  Phosphorus  Result Value Ref Range   Phosphorus 10.0 (H) 2.5 - 4.6 mg/dL  TSH  Result Value Ref Range   TSH 1.070 0.350 - 4.500 uIU/mL  T4, free  Result Value Ref Range   Free T4 0.82 0.61 - 1.12 ng/dL  Comprehensive metabolic panel  Result Value Ref Range   Sodium 135 135 - 145 mmol/L   Potassium >7.5 (HH) 3.5 - 5.1 mmol/L   Chloride 98 98 - 111 mmol/L   CO2 <7 (L) 22 - 32 mmol/L   Glucose, Bld 1,135 (HH) 70 - 99 mg/dL   BUN 29 (H) 6 - 20 mg/dL   Creatinine, Ser 2.28 (H) 0.44 - 1.00 mg/dL   Calcium 7.9 (L) 8.9 - 10.3 mg/dL   Total Protein 5.5 (L) 6.5 - 8.1 g/dL   Albumin 2.8 (L) 3.5 - 5.0 g/dL   AST 33 15 - 41 U/L   ALT 20 0 - 44 U/L   Alkaline Phosphatase 89 38 - 126 U/L   Total Bilirubin 2.9 (H) 0.3 - 1.2 mg/dL   GFR, Estimated 25 (L) >60 mL/min   Anion gap NOT CALCULATED 5 - 15  Magnesium  Result Value Ref Range   Magnesium 2.4 1.7 - 2.4 mg/dL  Phosphorus  Result Value Ref Range   Phosphorus 9.7 (H) 2.5 - 4.6 mg/dL  Amylase  Result Value Ref Range   Amylase 50 28 - 100 U/L  Lipase, blood  Result Value Ref Range   Lipase 85 (H) 11 - 51 U/L  Lactic acid, plasma  Result Value Ref Range   Lactic Acid, Venous 4.5 (HH) 0.5 - 1.9 mmol/L  Procalcitonin  Result Value Ref Range   Procalcitonin 7.22 ng/mL  CBC with Differential/Platelet  Result Value Ref Range   WBC 40.7 (H) 4.0 - 10.5 K/uL   RBC 3.43 (L) 3.87 - 5.11 MIL/uL   Hemoglobin 11.3 (L) 12.0 - 15.0 g/dL   HCT 38.4 36.0 - 46.0 %   MCV 112.0 (H) 80.0 - 100.0 fL   MCH 32.9 26.0 - 34.0 pg   MCHC 29.4 (L) 30.0 - 36.0 g/dL   RDW 12.3 11.5 - 15.5 %   Platelets 304 150 - 400 K/uL   nRBC 0.0 0.0 - 0.2 %   Neutrophils Relative % 83 %    Neutro Abs 34.2 (H) 1.7 - 7.7 K/uL   Lymphocytes Relative 8 %   Lymphs Abs 3.1 0.7 - 4.0 K/uL   Monocytes Relative 5 %   Monocytes Absolute 1.8 (H) 0.1 - 1.0 K/uL   Eosinophils Relative 0 %   Eosinophils Absolute 0.0 0.0 - 0.5 K/uL   Basophils Relative 0 %   Basophils Absolute 0.1 0.0 - 0.1 K/uL   WBC Morphology MILD LEFT SHIFT (1-5% METAS, OCC MYELO, OCC BANDS)    RBC Morphology MIXED RBC POPULATION    Smear Review Normal platelet morphology    Immature Granulocytes 4 %   Abs Immature Granulocytes 1.48 (H) 0.00 - 0.07 K/uL  Blood gas, venous  Result Value Ref Range   pH, Ven 6.96 (LL) 7.25 - 7.43   pCO2, Ven <18 (LL) 44 - 60 mmHg   pO2, Ven 72 (H) 32 - 45 mmHg   Bicarbonate 3.4 (L) 20.0 - 28.0 mmol/L   Acid-base deficit 27.0 (H) 0.0 - 2.0 mmol/L   O2 Saturation 94.9 %  Patient temperature 37.0    Collection site VEIN   Comprehensive metabolic panel  Result Value Ref Range   Sodium 135 135 - 145 mmol/L   Potassium 7.3 (HH) 3.5 - 5.1 mmol/L   Chloride 98 98 - 111 mmol/L   CO2 <7 (L) 22 - 32 mmol/L   Glucose, Bld 1,054 (HH) 70 - 99 mg/dL   BUN 31 (H) 6 - 20 mg/dL   Creatinine, Ser 2.39 (H) 0.44 - 1.00 mg/dL   Calcium 7.9 (L) 8.9 - 10.3 mg/dL   Total Protein 5.8 (L) 6.5 - 8.1 g/dL   Albumin 3.0 (L) 3.5 - 5.0 g/dL   AST 38 15 - 41 U/L   ALT 21 0 - 44 U/L   Alkaline Phosphatase 100 38 - 126 U/L   Total Bilirubin 2.8 (H) 0.3 - 1.2 mg/dL   GFR, Estimated 23 (L) >60 mL/min   Anion gap NOT CALCULATED 5 - 15  Cortisol  Result Value Ref Range   Cortisol, Plasma 95.3 ug/dL  Basic metabolic panel  Result Value Ref Range   Sodium 146 (H) 135 - 145 mmol/L   Potassium 3.9 3.5 - 5.1 mmol/L   Chloride 107 98 - 111 mmol/L   CO2 17 (L) 22 - 32 mmol/L   Glucose, Bld 443 (H) 70 - 99 mg/dL   BUN 27 (H) 6 - 20 mg/dL   Creatinine, Ser 1.81 (H) 0.44 - 1.00 mg/dL   Calcium 7.7 (L) 8.9 - 10.3 mg/dL   GFR, Estimated 33 (L) >60 mL/min   Anion gap 22 (H) 5 - 15  Basic metabolic panel   Result Value Ref Range   Sodium 149 (H) 135 - 145 mmol/L   Potassium 3.2 (L) 3.5 - 5.1 mmol/L   Chloride 110 98 - 111 mmol/L   CO2 26 22 - 32 mmol/L   Glucose, Bld 150 (H) 70 - 99 mg/dL   BUN 27 (H) 6 - 20 mg/dL   Creatinine, Ser 1.56 (H) 0.44 - 1.00 mg/dL   Calcium 7.8 (L) 8.9 - 10.3 mg/dL   GFR, Estimated 39 (L) >60 mL/min   Anion gap 13 5 - 15  Draw ABG 1 hour after initiation of ventilator  Result Value Ref Range   FIO2 60 %   Delivery systems MANUAL VENTILATION    MECHVT 480 mL   RATE 32 resp/min   PEEP 5 cm H20   pH, Arterial 7.08 (LL) 7.35 - 7.45   pCO2 arterial 42 32 - 48 mmHg   pO2, Arterial 156 (H) 83 - 108 mmHg   Bicarbonate 12.5 (L) 20.0 - 28.0 mmol/L   Acid-base deficit 17.0 (H) 0.0 - 2.0 mmol/L   O2 Saturation 98.9 %   Patient temperature 37.0    Collection site LEFT RADIAL    Allens test (pass/fail) PASS PASS  Lactic acid, plasma  Result Value Ref Range   Lactic Acid, Venous 4.7 (HH) 0.5 - 1.9 mmol/L  Basic metabolic panel  Result Value Ref Range   Sodium 148 (H) 135 - 145 mmol/L   Potassium 3.1 (L) 3.5 - 5.1 mmol/L   Chloride 113 (H) 98 - 111 mmol/L   CO2 25 22 - 32 mmol/L   Glucose, Bld 135 (H) 70 - 99 mg/dL   BUN 25 (H) 6 - 20 mg/dL   Creatinine, Ser 1.30 (H) 0.44 - 1.00 mg/dL   Calcium 7.0 (L) 8.9 - 10.3 mg/dL   GFR, Estimated 49 (L) >60 mL/min   Anion gap  10 5 - 15  Magnesium  Result Value Ref Range   Magnesium 1.7 1.7 - 2.4 mg/dL  Phosphorus  Result Value Ref Range   Phosphorus 2.2 (L) 2.5 - 4.6 mg/dL  Procalcitonin  Result Value Ref Range   Procalcitonin 12.26 ng/mL  Glucose, capillary  Result Value Ref Range   Glucose-Capillary 594 (HH) 70 - 99 mg/dL   Comment 1 Notify RN   Glucose, capillary  Result Value Ref Range   Glucose-Capillary 583 (HH) 70 - 99 mg/dL   Comment 1 Notify RN   Triglycerides  Result Value Ref Range   Triglycerides 62 <150 mg/dL  Glucose, capillary  Result Value Ref Range   Glucose-Capillary 350 (H) 70 - 99  mg/dL  Glucose, capillary  Result Value Ref Range   Glucose-Capillary 429 (H) 70 - 99 mg/dL  Blood gas, arterial  Result Value Ref Range   FIO2 40 %   Delivery systems VENTILATOR    Mode PRESSURE REGULATED VOLUME CONTROL    MECHVT 480 mL   RATE 34 resp/min   PEEP 5 cm H20   pH, Arterial 7.46 (H) 7.35 - 7.45   pCO2 arterial 27 (L) 32 - 48 mmHg   pO2, Arterial 172 (H) 83 - 108 mmHg   Bicarbonate 19.2 (L) 20.0 - 28.0 mmol/L   Acid-base deficit 3.2 (H) 0.0 - 2.0 mmol/L   O2 Saturation 99.8 %   Patient temperature 37.0    Collection site A-LINE    Drawn by ARTERIAL DRAW   Glucose, capillary  Result Value Ref Range   Glucose-Capillary 398 (H) 70 - 99 mg/dL  Basic metabolic panel  Result Value Ref Range   Sodium 148 (H) 135 - 145 mmol/L   Potassium 3.5 3.5 - 5.1 mmol/L   Chloride 111 98 - 111 mmol/L   CO2 26 22 - 32 mmol/L   Glucose, Bld 163 (H) 70 - 99 mg/dL   BUN 27 (H) 6 - 20 mg/dL   Creatinine, Ser 1.26 (H) 0.44 - 1.00 mg/dL   Calcium 7.8 (L) 8.9 - 10.3 mg/dL   GFR, Estimated 50 (L) >60 mL/min   Anion gap 11 5 - 15  Glucose, capillary  Result Value Ref Range   Glucose-Capillary 349 (H) 70 - 99 mg/dL  Glucose, capillary  Result Value Ref Range   Glucose-Capillary 306 (H) 70 - 99 mg/dL  Glucose, capillary  Result Value Ref Range   Glucose-Capillary 251 (H) 70 - 99 mg/dL  Glucose, capillary  Result Value Ref Range   Glucose-Capillary 303 (H) 70 - 99 mg/dL  Glucose, capillary  Result Value Ref Range   Glucose-Capillary 159 (H) 70 - 99 mg/dL  Glucose, capillary  Result Value Ref Range   Glucose-Capillary 138 (H) 70 - 99 mg/dL  Glucose, capillary  Result Value Ref Range   Glucose-Capillary 153 (H) 70 - 99 mg/dL  Glucose, capillary  Result Value Ref Range   Glucose-Capillary 143 (H) 70 - 99 mg/dL  CBC  Result Value Ref Range   WBC 19.4 (H) 4.0 - 10.5 K/uL   RBC 3.24 (L) 3.87 - 5.11 MIL/uL   Hemoglobin 10.4 (L) 12.0 - 15.0 g/dL   HCT 29.8 (L) 36.0 - 46.0 %   MCV  92.0 80.0 - 100.0 fL   MCH 32.1 26.0 - 34.0 pg   MCHC 34.9 30.0 - 36.0 g/dL   RDW 11.8 11.5 - 15.5 %   Platelets 227 150 - 400 K/uL   nRBC 0.0 0.0 - 0.2 %  Glucose, capillary  Result Value Ref Range   Glucose-Capillary 140 (H) 70 - 99 mg/dL  Glucose, capillary  Result Value Ref Range   Glucose-Capillary 134 (H) 70 - 99 mg/dL  Phosphorus  Result Value Ref Range   Phosphorus 3.3 2.5 - 4.6 mg/dL  Basic metabolic panel  Result Value Ref Range   Sodium 146 (H) 135 - 145 mmol/L   Potassium 4.1 3.5 - 5.1 mmol/L   Chloride 109 98 - 111 mmol/L   CO2 28 22 - 32 mmol/L   Glucose, Bld 216 (H) 70 - 99 mg/dL   BUN 24 (H) 6 - 20 mg/dL   Creatinine, Ser 1.16 (H) 0.44 - 1.00 mg/dL   Calcium 7.6 (L) 8.9 - 10.3 mg/dL   GFR, Estimated 56 (L) >60 mL/min   Anion gap 9 5 - 15  Vancomycin, random  Result Value Ref Range   Vancomycin Rm 9 ug/mL  Glucose, capillary  Result Value Ref Range   Glucose-Capillary 147 (H) 70 - 99 mg/dL  Basic metabolic panel  Result Value Ref Range   Sodium 146 (H) 135 - 145 mmol/L   Potassium 3.9 3.5 - 5.1 mmol/L   Chloride 109 98 - 111 mmol/L   CO2 27 22 - 32 mmol/L   Glucose, Bld 208 (H) 70 - 99 mg/dL   BUN 25 (H) 6 - 20 mg/dL   Creatinine, Ser 1.35 (H) 0.44 - 1.00 mg/dL   Calcium 7.7 (L) 8.9 - 10.3 mg/dL   GFR, Estimated 46 (L) >60 mL/min   Anion gap 10 5 - 15  Glucose, capillary  Result Value Ref Range   Glucose-Capillary 163 (H) 70 - 99 mg/dL  Glucose, capillary  Result Value Ref Range   Glucose-Capillary 164 (H) 70 - 99 mg/dL  Glucose, capillary  Result Value Ref Range   Glucose-Capillary 190 (H) 70 - 99 mg/dL  Glucose, capillary  Result Value Ref Range   Glucose-Capillary 187 (H) 70 - 99 mg/dL  CBC  Result Value Ref Range   WBC 11.8 (H) 4.0 - 10.5 K/uL   RBC 2.98 (L) 3.87 - 5.11 MIL/uL   Hemoglobin 9.5 (L) 12.0 - 15.0 g/dL   HCT 29.3 (L) 36.0 - 46.0 %   MCV 98.3 80.0 - 100.0 fL   MCH 31.9 26.0 - 34.0 pg   MCHC 32.4 30.0 - 36.0 g/dL   RDW  12.2 11.5 - 15.5 %   Platelets 135 (L) 150 - 400 K/uL   nRBC 0.0 0.0 - 0.2 %  Basic metabolic panel  Result Value Ref Range   Sodium 148 (H) 135 - 145 mmol/L   Potassium 3.8 3.5 - 5.1 mmol/L   Chloride 113 (H) 98 - 111 mmol/L   CO2 28 22 - 32 mmol/L   Glucose, Bld 174 (H) 70 - 99 mg/dL   BUN 21 (H) 6 - 20 mg/dL   Creatinine, Ser 1.06 (H) 0.44 - 1.00 mg/dL   Calcium 7.9 (L) 8.9 - 10.3 mg/dL   GFR, Estimated >60 >60 mL/min   Anion gap 7 5 - 15  Procalcitonin  Result Value Ref Range   Procalcitonin 7.00 ng/mL  Magnesium  Result Value Ref Range   Magnesium 2.4 1.7 - 2.4 mg/dL  Phosphorus  Result Value Ref Range   Phosphorus 2.4 (L) 2.5 - 4.6 mg/dL  Glucose, capillary  Result Value Ref Range   Glucose-Capillary 204 (H) 70 - 99 mg/dL  Glucose, capillary  Result Value Ref Range   Glucose-Capillary 233 (  H) 70 - 99 mg/dL  Glucose, capillary  Result Value Ref Range   Glucose-Capillary 193 (H) 70 - 99 mg/dL  Glucose, capillary  Result Value Ref Range   Glucose-Capillary 131 (H) 70 - 99 mg/dL  Glucose, capillary  Result Value Ref Range   Glucose-Capillary 110 (H) 70 - 99 mg/dL  Glucose, capillary  Result Value Ref Range   Glucose-Capillary 197 (H) 70 - 99 mg/dL  Blood gas, arterial  Result Value Ref Range   O2 Content 15.0 L/min   Delivery systems NASAL CANNULA    pH, Arterial 7.47 (H) 7.35 - 7.45   pCO2 arterial 47 32 - 48 mmHg   pO2, Arterial 189 (H) 83 - 108 mmHg   Bicarbonate 34.2 (H) 20.0 - 28.0 mmol/L   Acid-Base Excess 9.1 (H) 0.0 - 2.0 mmol/L   O2 Saturation 99.8 %   Patient temperature 37.0    Collection site A-LINE    Allens test (pass/fail) YEAST (A) PASS  CBC  Result Value Ref Range   WBC 10.9 (H) 4.0 - 10.5 K/uL   RBC 3.30 (L) 3.87 - 5.11 MIL/uL   Hemoglobin 10.6 (L) 12.0 - 15.0 g/dL   HCT 32.5 (L) 36.0 - 46.0 %   MCV 98.5 80.0 - 100.0 fL   MCH 32.1 26.0 - 34.0 pg   MCHC 32.6 30.0 - 36.0 g/dL   RDW 12.1 11.5 - 15.5 %   Platelets 105 (L) 150 - 400 K/uL    nRBC 0.0 0.0 - 0.2 %  Basic metabolic panel  Result Value Ref Range   Sodium 143 135 - 145 mmol/L   Potassium 4.1 3.5 - 5.1 mmol/L   Chloride 113 (H) 98 - 111 mmol/L   CO2 25 22 - 32 mmol/L   Glucose, Bld 197 (H) 70 - 99 mg/dL   BUN 17 6 - 20 mg/dL   Creatinine, Ser 0.80 0.44 - 1.00 mg/dL   Calcium 8.0 (L) 8.9 - 10.3 mg/dL   GFR, Estimated >60 >60 mL/min   Anion gap 5 5 - 15  Phosphorus  Result Value Ref Range   Phosphorus 1.9 (L) 2.5 - 4.6 mg/dL  Procalcitonin  Result Value Ref Range   Procalcitonin 2.58 ng/mL  Glucose, capillary  Result Value Ref Range   Glucose-Capillary 160 (H) 70 - 99 mg/dL  Glucose, capillary  Result Value Ref Range   Glucose-Capillary 191 (H) 70 - 99 mg/dL  Glucose, capillary  Result Value Ref Range   Glucose-Capillary 176 (H) 70 - 99 mg/dL  Glucose, capillary  Result Value Ref Range   Glucose-Capillary 131 (H) 70 - 99 mg/dL  Glucose, capillary  Result Value Ref Range   Glucose-Capillary 149 (H) 70 - 99 mg/dL  Glucose, capillary  Result Value Ref Range   Glucose-Capillary 123 (H) 70 - 99 mg/dL  CBC  Result Value Ref Range   WBC 10.5 4.0 - 10.5 K/uL   RBC 3.14 (L) 3.87 - 5.11 MIL/uL   Hemoglobin 10.1 (L) 12.0 - 15.0 g/dL   HCT 30.8 (L) 36.0 - 46.0 %   MCV 98.1 80.0 - 100.0 fL   [facility]CH 32.2 26.0 - 34.0 pg   MCHC 32.8 30.0 - 36.0 g/dL   RDW 11.9 11.5 - 15.5 %   Platelets 94 (L) 150 - 400 K/uL   nRBC 0.0 0.0 - 0.2 %  Basic metabolic panel  Result Value Ref Range   Sodium 143 135 - 145 mmol/L   Potassium 3.1 (L) 3.5 - 5.1  mmol/L   Chloride 112 (H) 98 - 111 mmol/L   CO2 28 22 - 32 mmol/L   Glucose, Bld 78 70 - 99 mg/dL   BUN 15 6 - 20 mg/dL   Creatinine, Ser 0.72 0.44 - 1.00 mg/dL   Calcium 7.6 (L) 8.9 - 10.3 mg/dL   GFR, Estimated >60 >60 mL/min   Anion gap 3 (L) 5 - 15  Phosphorus  Result Value Ref Range   Phosphorus 2.1 (L) 2.5 - 4.6 mg/dL  Magnesium  Result Value Ref Range   Magnesium 2.0 1.7 - 2.4 mg/dL  Glucose, capillary   Result Value Ref Range   Glucose-Capillary 121 (H) 70 - 99 mg/dL  Glucose, capillary  Result Value Ref Range   Glucose-Capillary 118 (H) 70 - 99 mg/dL  Glucose, capillary  Result Value Ref Range   Glucose-Capillary 68 (L) 70 - 99 mg/dL  Glucose, capillary  Result Value Ref Range   Glucose-Capillary 155 (H) 70 - 99 mg/dL  Basic metabolic panel  Result Value Ref Range   Sodium 139 135 - 145 mmol/L   Potassium 4.5 3.5 - 5.1 mmol/L   Chloride 106 98 - 111 mmol/L   CO2 26 22 - 32 mmol/L   Glucose, Bld 121 (H) 70 - 99 mg/dL   BUN 53 (H) 6 - 20 mg/dL   Creatinine, Ser 1.36 (H) 0.44 - 1.00 mg/dL   Calcium 8.6 (L) 8.9 - 10.3 mg/dL   GFR, Estimated 46 (L) >60 mL/min   Anion gap 7 5 - 15  Phosphorus  Result Value Ref Range   Phosphorus 4.0 2.5 - 4.6 mg/dL  Magnesium  Result Value Ref Range   Magnesium 2.3 1.7 - 2.4 mg/dL  Glucose, capillary  Result Value Ref Range   Glucose-Capillary 258 (H) 70 - 99 mg/dL  Glucose, capillary  Result Value Ref Range   Glucose-Capillary 244 (H) 70 - 99 mg/dL   Comment 1 Notify RN   Glucose, capillary  Result Value Ref Range   Glucose-Capillary 135 (H) 70 - 99 mg/dL  Glucose, capillary  Result Value Ref Range   Glucose-Capillary 191 (H) 70 - 99 mg/dL  Glucose, capillary  Result Value Ref Range   Glucose-Capillary 207 (H) 70 - 99 mg/dL  POC Urine Pregnancy, ED  Result Value Ref Range   Preg Test, Ur NEGATIVE NEGATIVE  CBG monitoring, ED  Result Value Ref Range   Glucose-Capillary >600 (HH) 70 - 99 mg/dL  CBG monitoring, ED  Result Value Ref Range   Glucose-Capillary >600 (HH) 70 - 99 mg/dL  CBG monitoring, ED  Result Value Ref Range   Glucose-Capillary >600 (HH) 70 - 99 mg/dL  CBG monitoring, ED  Result Value Ref Range   Glucose-Capillary >600 (HH) 70 - 99 mg/dL  CBG monitoring, ED  Result Value Ref Range   Glucose-Capillary >600 (HH) 70 - 99 mg/dL  ECHOCARDIOGRAM COMPLETE  Result Value Ref Range   Weight 3,580.27 oz   Height 68  in   BP 79/34 mmHg   Ao pk vel 1.70 m/s   AV Peak grad 11.6 mmHg   Single Plane A4C EF 59.1 %   S' Lateral 3.00 cm   Area-P 1/2 2.97 cm2  Troponin I (High Sensitivity)  Result Value Ref Range   Troponin I (High Sensitivity) 18 (H) <18 ng/L     COVID 19 screen:  No recent travel or known exposure to COVID19 The patient denies respiratory symptoms of COVID 19 at this time. The importance  of social distancing was discussed today.   Assessment and Plan Problem List Items Addressed This Visit     Acute metabolic encephalopathy    Acute, now resolved but she does have some situational anxiety associated with her recent health crisis and intubation.  She is very tearful today in the office.  She will move forward with setting up a therapist as planned to discuss the event.      AKI (acute kidney injury) ([place])    Will reevaluate with labs.  We need to be careful with diuresis to get fluid off but not worsening kidney function.      Closed fracture of left proximal humerus    Pending follow-up with Ortho to reevaluate left shoulder.  Currently wearing sling.  Using Tylenol for pain.      Fluid overload    Acute status post heavy IV fluids due to dehydration in DKA.  She has had significant response to 1 dose of Lasix.  We will have her follow her blood pressure to make sure she is not dropping low and I will recheck her potassium and kidney function today.  We will likely plan a full 6 days of Lasix 20 mg daily to fully diurese.      Relevant Orders   Basic Metabolic Panel (Completed)   Type 1 diabetes mellitus with complications ([place]) - Primary    Chronic, improving control since hospitalization on current medications.  I agree that she would do well with Dexcom and likely insulin pump.  She has appointment with endocrinology in February  Tuojeo 17 units daily ON SSI Novolog  On Mounjaro 7.5 mg weekly      Other Visit Diagnoses     Acute renal failure, unspecified acute renal  failure type [facility])       Relevant Orders   Basic Metabolic Panel (Completed)      Orders Placed This Encounter  Procedures   Basic Metabolic Panel       Eliezer Lofts, MD

## 2022-05-17 NOTE — Assessment & Plan Note (Signed)
Chronic, improving control since hospitalization on current medications.  I agree that she would do well with Dexcom and likely insulin pump.  She has appointment with endocrinology in February  Tuojeo 17 units daily ON SSI Novolog  On Mounjaro 7.5 mg weekly

## 2022-05-17 NOTE — Patient Instructions (Addendum)
Please stop at the lab to have labs drawn.  Continue lasix 20- mg once daily in Am  Continue to follow blood sugars at home.  Follow blood pressure at home..  hold lasix if BP < 90/60.  Can  try flonase 2 spray per nostril daily for nasal swelling.

## 2022-05-17 NOTE — Assessment & Plan Note (Signed)
Acute, now resolved but she does have some situational anxiety associated with her recent health crisis and intubation.  She is very tearful today in the office.  She will move forward with setting up a therapist as planned to discuss the event.

## 2022-05-17 NOTE — Assessment & Plan Note (Signed)
Will reevaluate with labs.  We need to be careful with diuresis to get fluid off but not worsening kidney function.

## 2022-05-21 ENCOUNTER — Other Ambulatory Visit: Payer: Self-pay | Admitting: Podiatry

## 2022-05-24 IMAGING — MG DIGITAL SCREENING BILAT W/ TOMO W/ CAD
8 series · 8 of 24 positions shown · non-contrast
Comparison: Previous exam(s).

CLINICAL DATA: Screening.

EXAM:
DIGITAL SCREENING BILATERAL MAMMOGRAM WITH TOMO AND CAD

[R CC synth-2D]
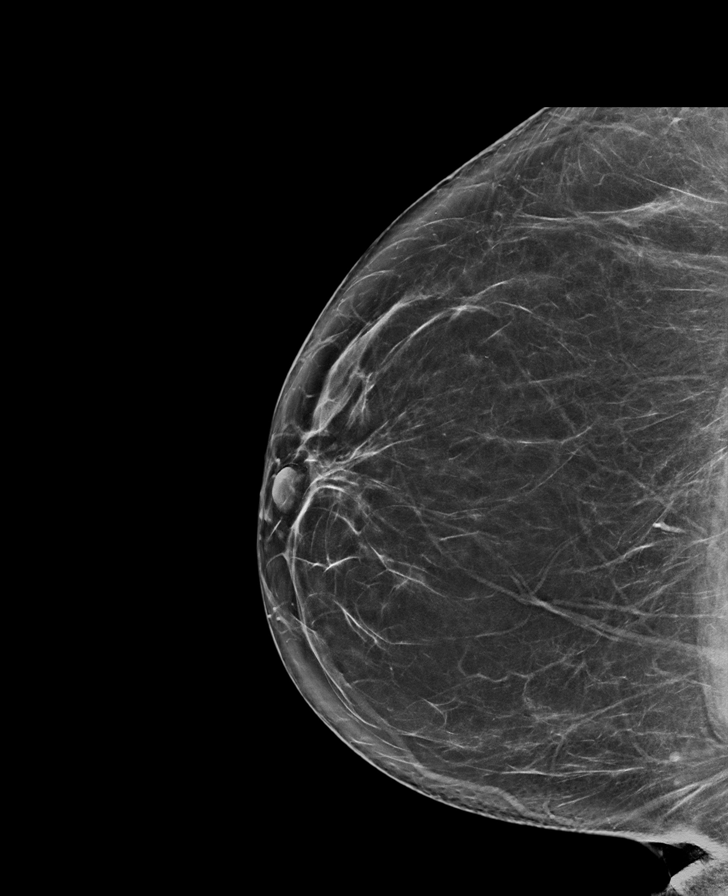

[L CC synth-2D]
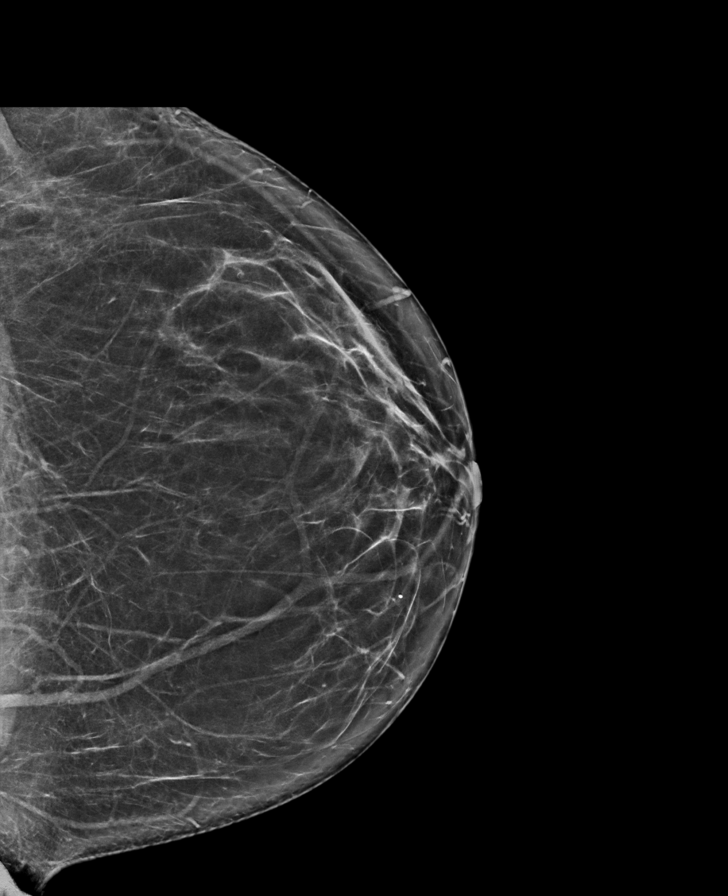

[R MLO synth-2D]
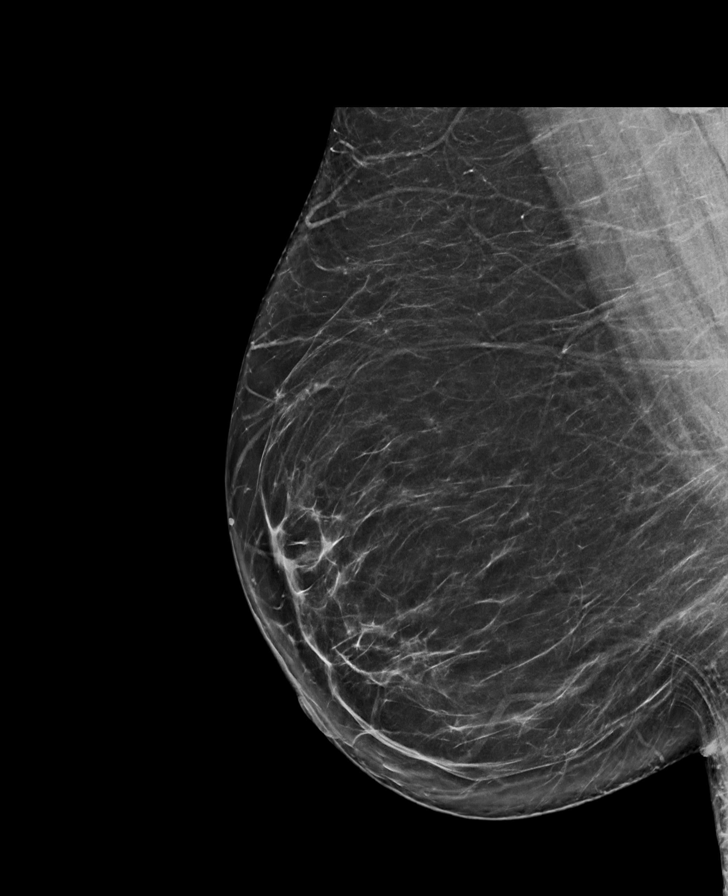

[L MLO synth-2D]
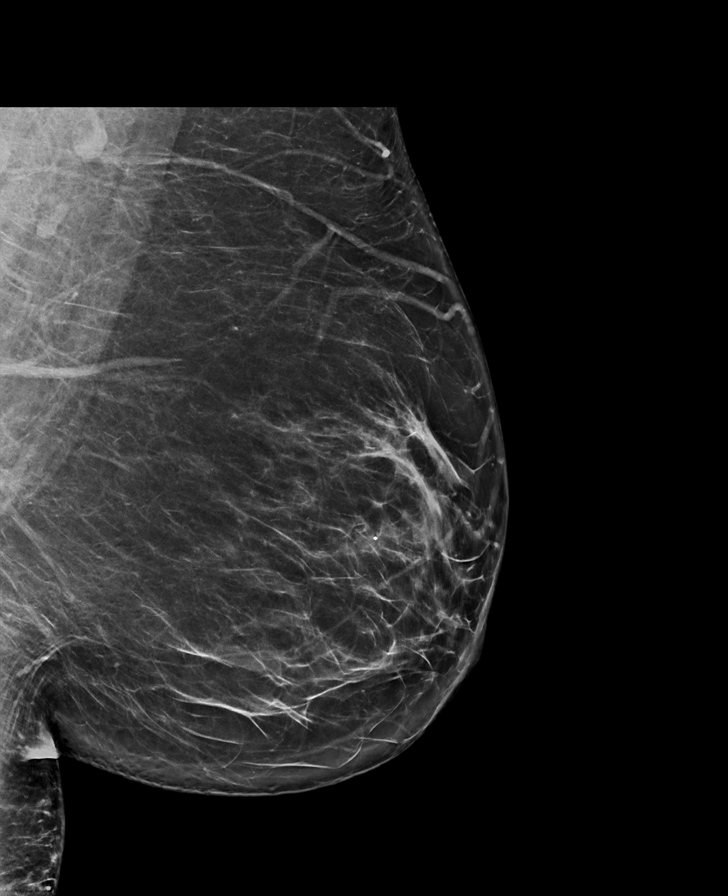

[L MLO tomo · tomo slice 44/87.0]
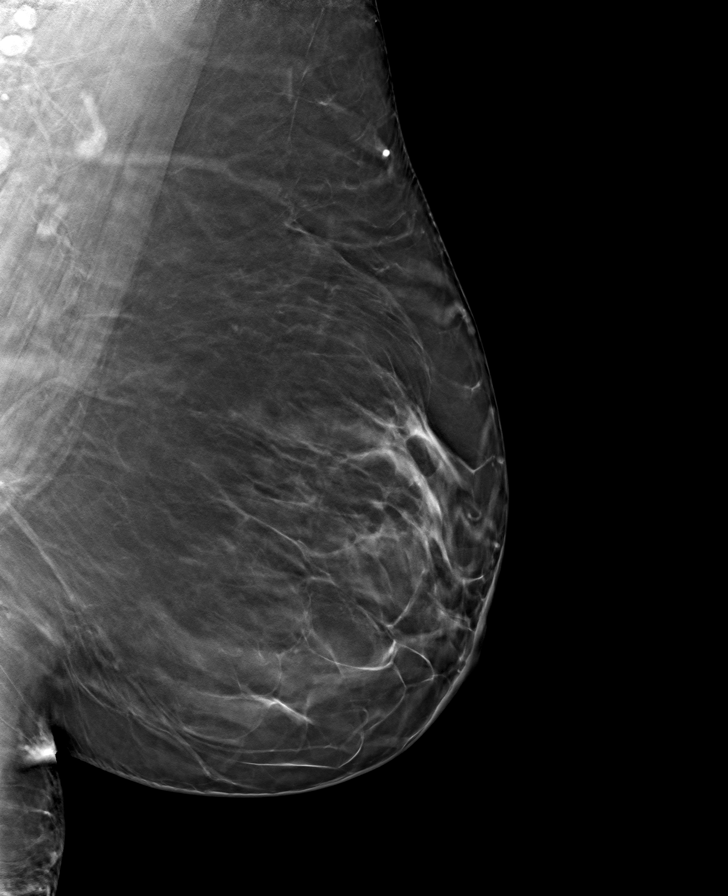

[L CC tomo · tomo slice 39/77.0]
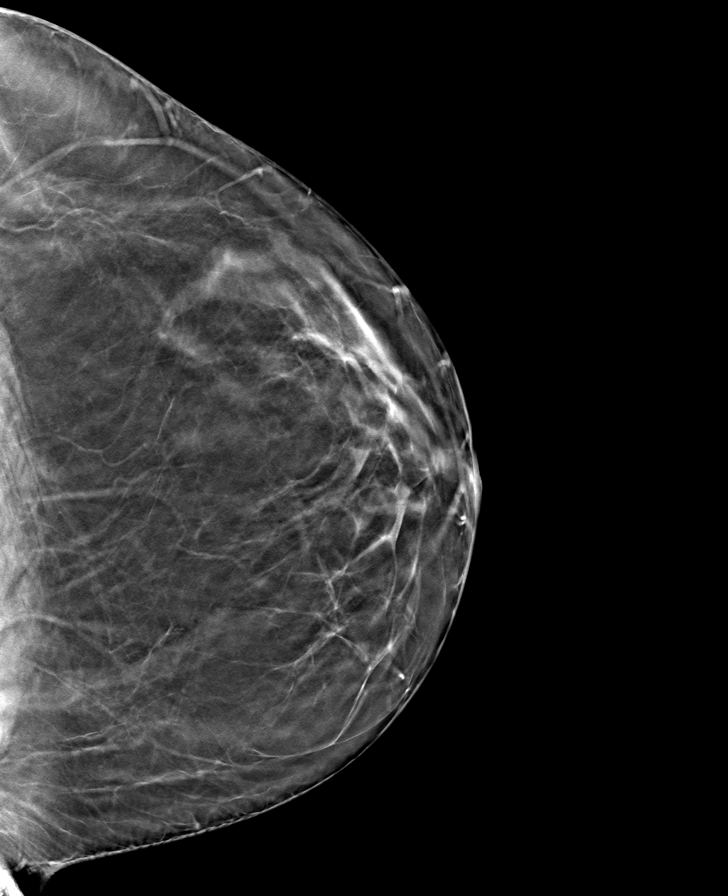

[R CC tomo · tomo slice 42/83.0]
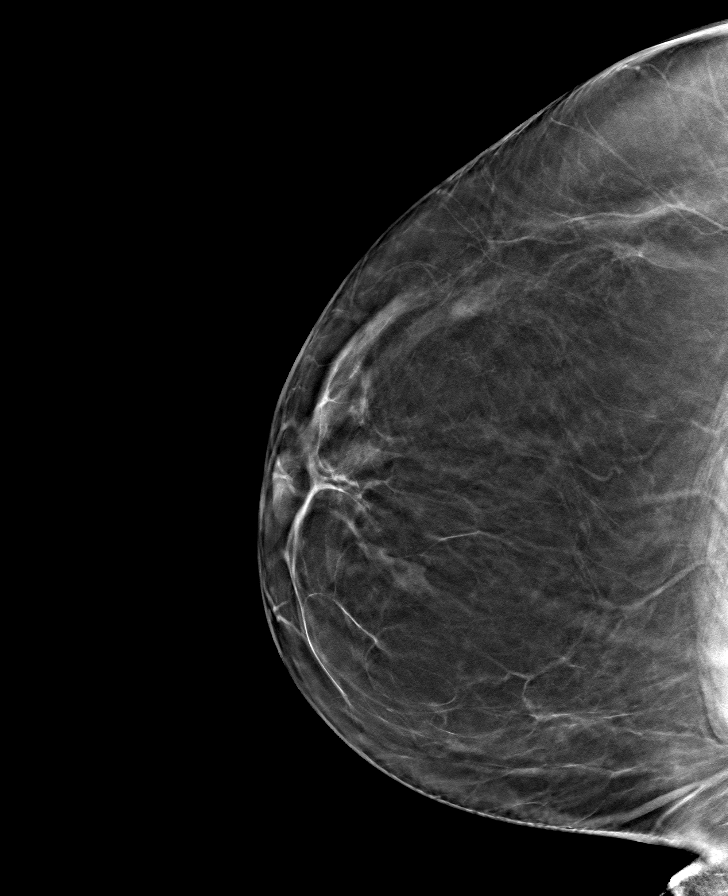

[R MLO tomo · tomo slice 43/86.0]
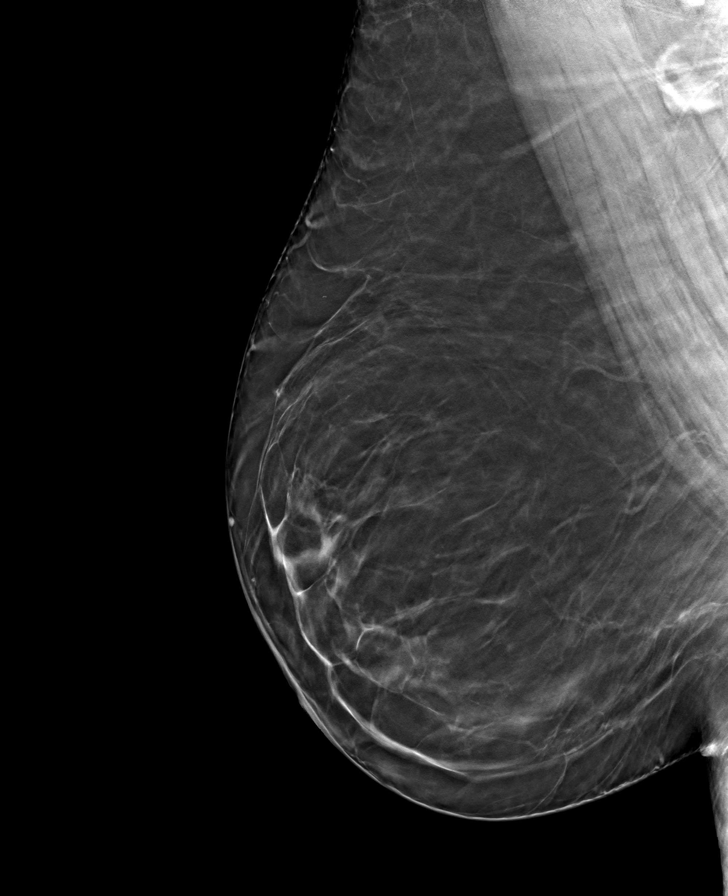

[8 of 24 positions shown; findings below may reference images not displayed]

ACR Breast Density Category b: There are scattered areas of
fibroglandular density.
FINDINGS: There are no findings suspicious for malignancy. Images were
processed with CAD.
IMPRESSION: No mammographic evidence of malignancy. A result letter of this
screening mammogram will be mailed directly to the patient.

RECOMMENDATION:
Screening mammogram in one year. (Code:[id number])

BI-RADS CATEGORY  1: Negative.

## 2022-05-29 ENCOUNTER — Telehealth: Payer: Self-pay | Admitting: Family Medicine

## 2022-05-29 NOTE — Telephone Encounter (Signed)
Home Health verbal orders Caller Name: [place] Name: [facility] [place] number: [phone], secured   Requesting OT/PT/Skilled nursing/Social Work/Speech: PT  Reason:  Frequency: one week four  Please forward to [facility] pool or providers CMA

## 2022-05-30 ENCOUNTER — Encounter: Payer: Self-pay | Admitting: Family Medicine

## 2022-05-30 ENCOUNTER — Ambulatory Visit: Payer: BC Managed Care – PPO | Admitting: Family Medicine

## 2022-05-30 VITALS — BP 140/82 | HR 82 | Temp 96.3°F | Resp 16 | Ht 68.0 in | Wt 263.4 lb

## 2022-05-30 DIAGNOSIS — S42295A Other nondisplaced fracture of upper end of left humerus, initial encounter for closed fracture: Secondary | ICD-10-CM | POA: Diagnosis not present

## 2022-05-30 DIAGNOSIS — J0100 Acute maxillary sinusitis, unspecified: Secondary | ICD-10-CM

## 2022-05-30 DIAGNOSIS — E8779 Other fluid overload: Secondary | ICD-10-CM

## 2022-05-30 DIAGNOSIS — E108 Type 1 diabetes mellitus with unspecified complications: Secondary | ICD-10-CM | POA: Diagnosis not present

## 2022-05-30 DIAGNOSIS — E1111 Type 2 diabetes mellitus with ketoacidosis with coma: Secondary | ICD-10-CM

## 2022-05-30 DIAGNOSIS — N179 Acute kidney failure, unspecified: Secondary | ICD-10-CM | POA: Diagnosis not present

## 2022-05-30 DIAGNOSIS — K5900 Constipation, unspecified: Secondary | ICD-10-CM

## 2022-05-30 MED ORDER — ASPIRIN 81 MG PO CHEW: 81.0000 mg | CHEWABLE_TABLET | Freq: Every day | ORAL | 0 refills | Status: DC

## 2022-05-30 MED ORDER — FUROSEMIDE 20 MG PO TABS: 20.0000 mg | ORAL_TABLET | Freq: Every day | ORAL | 0 refills | Status: DC

## 2022-05-30 MED ORDER — POTASSIUM CHLORIDE CRYS ER 20 MEQ PO TBCR: 20.0000 meq | EXTENDED_RELEASE_TABLET | Freq: Every day | ORAL | 0 refills | Status: DC

## 2022-05-30 NOTE — Assessment & Plan Note (Signed)
Acute, minimal improvement with addition of Lasix 20 mg x 6 days.  She will increase Lasix to 40 mg daily for several days then return to 20 mg p.o. daily as needed.  Kidney function was back at baseline at last check.  I will start potassium along with her furosemide to prevent hypokalemia.

## 2022-05-30 NOTE — Assessment & Plan Note (Signed)
Resolved with recent labs.

## 2022-05-30 NOTE — Telephone Encounter (Signed)
Please provide verbal orders as requested

## 2022-05-30 NOTE — Assessment & Plan Note (Addendum)
Chronic, plan follow-up in February with endocrine. Given his frequent bedtime in a.m. lows.  We will have her decrease her Toujeo to 22 units daily.  She will continue using the sliding scale as well as meal time NovoLog.  She will continue close monitoring with Dexcom.  We discussed low-carb high-protein and fiber diet.  Tuojeo 22 units daily ON SSI Novolog  On Mounjaro 7.5 mg weekly

## 2022-05-30 NOTE — Patient Instructions (Addendum)
Decrease Tuojeo to 22 Units daily. Continue  working on low carb diet.  Make sure to get fiber in each meal.  Continue keeping up with water.  Restart lasix 20 to 40 mg daily for the next week then go to as needed for peripheral edema. Each time you you take the lasix take a potassium tablet. Can stop protonix.  Check blood  pressure at ome every few days.

## 2022-05-30 NOTE — Assessment & Plan Note (Signed)
Improving, followed by orthopedics.  Plan continued sling.  Holding PT until follow-up in 2 weeks for repeat x-rays.

## 2022-05-30 NOTE — Assessment & Plan Note (Signed)
Acute, resolved with prune juice and MiraLAX.

## 2022-05-30 NOTE — Assessment & Plan Note (Signed)
Acute, improved symptoms but continued fluid in left ear.  She will increase her Flonase to 2 sprays per nostril daily.

## 2022-05-30 NOTE — Assessment & Plan Note (Signed)
Resolved

## 2022-05-30 NOTE — [place] Notes (Signed)
Patient ID: Tanya Graves, female    DOB: 23-Sep-1965, 57 y.o.   MRN: [id number]  This visit was conducted in person.  BP (!) 152/92   Pulse 82   Temp (!) 96.3 F (35.7 C) (Temporal)   Resp 16   Ht '5\' 8"'$  (1.727 m)   Wt 263 lb 6 oz (119.5 kg)   LMP 06/28/2015 (Approximate)   SpO2 99%   BMI 40.05 kg/m    CC: Chief Complaint  Patient presents with   Hospitalization Follow-up    Subjective:   HPI: Tanya Graves is a 57 y.o. female presenting on 05/30/2022 for Hospitalization Follow-up  Admitted to hospital May 10, 2022 Discharged May 15, 2022 principal diagnosis diabetic ketoacidosis  During hospitalization required intubation due to hypovolemic shock.  Found to be in sepsis with lactic acid acidosis, hypothermia and acute kidney injury. She had been found unresponsive at home by her daughter.  She had fallen and laid in admission when she was aware and able to respond it was noted that she had a fracture of her left proximal humerus. She was extubated on May 13, 2022.  Seen by myself 12/22 for peripheral edema.. started on lasix given fluid overload likely due to IV fluids during hospitalization. BMET showed glucose elevated at 339 but potassium in the normal range and creatinine back at baseline 0.77.  Wt Readings from Last 3 Encounters:  05/30/22 263 lb 6 oz (119.5 kg)  05/17/22 250 lb 6 oz (113.6 kg)  05/15/22 255 lb 11.7 oz (116 kg)      Today she reports she is  feeling better overall.  She continued to have " swelling" in lower legs and in lower pannus... legs and lower abdomen tissue feels firm. Despite daily lasix 20 mg x 6 days tortal.   Using BM daily... miralax caused gas but regular BMS. Now using prune juice.  Through the day 120-160  Few > 200  Having some low blood sugars at night.. having larger dinner meal.. helps.  She has started using Dexcom.      She has a planned follow-up appointment with endocrinology in February   likely insulin pump Currently on: Tuojeo 24 units daily ON SSI Novolog.. 17 units with each meal with additional SSI  On Mounjaro 7.5 mg weekly  Reviewed office visit note from May 29, 2022 with orthopedics Dr. Eliberto Ivory for left shoulder injury.  Recommended continued sling and continuation of range of motion.  Holding on physical therapy at this time.  Plan return to clinic in 2 weeks for repeat x-rays.  Relevant past medical, surgical, family and social history reviewed and updated as indicated. Interim medical history since our last visit reviewed. Allergies and medications reviewed and updated. Outpatient Medications Prior to Visit  Medication Sig Dispense Refill   albuterol (VENTOLIN HFA) 108 (90 Base) MCG/ACT inhaler Inhale 2 puffs into the lungs every 6 (six) hours as needed for wheezing or shortness of breath. 8 g 0   aspirin 81 MG chewable tablet Chew 1 tablet (81 mg total) by mouth daily. 30 tablet 0   atorvastatin (LIPITOR) 20 MG tablet Take 4 tablets (80 mg total) by mouth daily. 90 tablet 3   estradiol (ESTRACE) 0.1 MG/GM vaginal cream Place 1 Applicatorful vaginally at bedtime.     folic acid (FOLVITE) 1 MG tablet Take 1 tablet (1 mg total) by mouth daily. 30 tablet 0   furosemide (LASIX) 20 MG tablet Take 1 tablet (20 mg total) by  mouth daily. 3 tablet 0   insulin aspart (NOVOLOG) 100 UNIT/ML injection Inject 18 Units into the skin 3 (three) times daily with meals. 20 mL 0   insulin glargine, 1 Unit Dial, (TOUJEO SOLOSTAR) 300 UNIT/ML Solostar Pen Inject 24 Units into the skin at bedtime. 3 mL 0   levothyroxine (SYNTHROID) 175 MCG tablet Take 1 tablet (175 mcg total) by mouth daily with breakfast. 90 tablet 3   Multiple Vitamin (MULTIVITAMIN WITH MINERALS) TABS tablet Take 1 tablet by mouth daily. 30 tablet 0   pantoprazole (PROTONIX) 40 MG tablet Take 1 tablet (40 mg total) by mouth daily. 30 tablet 0   polyethylene glycol (MIRALAX / GLYCOLAX) 17 g packet Take 17 g by mouth daily  as needed for moderate constipation. 14 each 0   thiamine (VITAMIN B-1) 100 MG tablet Take 1 tablet (100 mg total) by mouth daily. 30 tablet 0   No facility-administered medications prior to visit.     Per HPI unless specifically indicated in ROS section below Review of Systems  Constitutional:  Negative for fatigue and fever.  HENT:  Negative for congestion.   Eyes:  Negative for pain.  Respiratory:  Negative for cough and shortness of breath.   Cardiovascular:  Negative for chest pain, palpitations and leg swelling.  Gastrointestinal:  Negative for abdominal pain.  Genitourinary:  Negative for dysuria and vaginal bleeding.  Musculoskeletal:  Negative for back pain.  Neurological:  Negative for syncope, light-headedness and headaches.  Psychiatric/Behavioral:  Negative for dysphoric mood.    Objective:  BP (!) 152/92   Pulse 82   Temp (!) 96.3 F (35.7 C) (Temporal)   Resp 16   Ht '5\' 8"'$  (1.727 m)   Wt 263 lb 6 oz (119.5 kg)   LMP 06/28/2015 (Approximate)   SpO2 99%   BMI 40.05 kg/m   Wt Readings from Last 3 Encounters:  05/30/22 263 lb 6 oz (119.5 kg)  05/17/22 250 lb 6 oz (113.6 kg)  05/15/22 255 lb 11.7 oz (116 kg)      Physical Exam Constitutional:      General: She is not in acute distress.    Appearance: Normal appearance. She is well-developed. She is not ill-appearing or toxic-appearing.  HENT:     Head: Normocephalic.     Right Ear: Hearing, tympanic membrane, ear canal and external ear normal. Tympanic membrane is not erythematous, retracted or bulging.     Left Ear: Hearing, ear canal and external ear normal. A middle ear effusion is present. Tympanic membrane is not erythematous, retracted or bulging.     Nose: No mucosal edema or rhinorrhea.     Right Sinus: No maxillary sinus tenderness or frontal sinus tenderness.     Left Sinus: No maxillary sinus tenderness or frontal sinus tenderness.     Mouth/Throat:     Pharynx: Uvula midline.  Eyes:      General: Lids are normal. Lids are everted, no foreign bodies appreciated.     Conjunctiva/sclera: Conjunctivae normal.     Pupils: Pupils are equal, round, and reactive to light.  Neck:     Thyroid: No thyroid mass or thyromegaly.     Vascular: No carotid bruit.     Trachea: Trachea normal.  Cardiovascular:     Rate and Rhythm: Normal rate and regular rhythm.     Pulses: Normal pulses.     Heart sounds: Normal heart sounds, S1 normal and S2 normal. No murmur heard.    No friction rub.  No gallop.  Pulmonary:     Effort: Pulmonary effort is normal. No tachypnea or respiratory distress.     Breath sounds: Normal breath sounds. No decreased breath sounds, wheezing, rhonchi or rales.  Abdominal:     General: Bowel sounds are normal.     Palpations: Abdomen is soft.     Tenderness: There is no abdominal tenderness.  Musculoskeletal:     Cervical back: Normal range of motion and neck supple.     Right lower leg: Edema present.     Left lower leg: Edema present.     Comments: Left arm with contusion and in sling.  Skin:    General: Skin is warm and dry.     Findings: No rash.  Neurological:     Mental Status: She is alert.  Psychiatric:        Mood and Affect: Mood is not anxious or depressed.        Speech: Speech normal.        Behavior: Behavior normal. Behavior is cooperative.        Thought Content: Thought content normal.        Judgment: Judgment normal.       Results for orders placed or performed in visit on 52/84/13  Basic Metabolic Panel  Result Value Ref Range   Sodium 139 135 - 145 mEq/L   Potassium 4.3 3.5 - 5.1 mEq/L   Chloride 102 96 - 112 mEq/L   CO2 31 19 - 32 mEq/L   Glucose, Bld 339 (H) 70 - 99 mg/dL   BUN 9 6 - 23 mg/dL   Creatinine, Ser 0.77 0.40 - 1.20 mg/dL   GFR 86.87 >60.00 mL/min   Calcium 8.2 (L) 8.4 - 10.5 mg/dL     COVID 19 screen:  No recent travel or known exposure to COVID19 The patient denies respiratory symptoms of COVID 19 at this  time. The importance of social distancing was discussed today.   Assessment and Plan    Problem List Items Addressed This Visit     Acute non-recurrent maxillary sinusitis    Acute, improved symptoms but continued fluid in left ear.  She will increase her Flonase to 2 sprays per nostril daily.      AKI (acute kidney injury) ([place])    Resolved with recent labs.      Closed fracture of left proximal humerus    Improving, followed by orthopedics.  Plan continued sling.  Holding PT until follow-up in 2 weeks for repeat x-rays.      Constipation    Acute, resolved with prune juice and MiraLAX.      DKA (diabetic ketoacidosis) ([facility])    Resolved      Relevant Medications   aspirin 81 MG chewable tablet   Fluid overload    Acute, minimal improvement with addition of Lasix 20 mg x 6 days.  She will increase Lasix to 40 mg daily for several days then return to 20 mg p.o. daily as needed.  Kidney function was back at baseline at last check.  I will start potassium along with her furosemide to prevent hypokalemia.      Type 1 diabetes mellitus with complications ([facility]) - Primary    Chronic, plan follow-up in February with endocrine. Given his frequent bedtime in a.m. lows.  We will have her decrease her Toujeo to 22 units daily.  She will continue using the sliding scale as well as meal time NovoLog.  She will continue close monitoring  with Dexcom.  We discussed low-carb high-protein and fiber diet.  Tuojeo 22 units daily ON SSI Novolog  On Mounjaro 7.5 mg weekly      Relevant Medications   aspirin 81 MG chewable tablet   Meds ordered this encounter  Medications   aspirin 81 MG chewable tablet    Sig: Chew 1 tablet (81 mg total) by mouth daily.    Dispense:  30 tablet    Refill:  0   furosemide (LASIX) 20 MG tablet    Sig: Take 1-2 tablets (20-40 mg total) by mouth daily.    Dispense:  30 tablet    Refill:  0   potassium chloride SA (KLOR-CON M) 20 MEQ tablet    Sig: Take  1 tablet (20 mEq total) by mouth daily. Take each time you take lasix.    Dispense:  30 tablet    Refill:  0      Eliezer Lofts, MD

## 2022-05-31 ENCOUNTER — Ambulatory Visit: Payer: Self-pay

## 2022-05-31 NOTE — Patient Outreach (Signed)
  Care Coordination   Follow Up Visit Note   05/31/2022 Name: Tanya Graves MRN: [id number] DOB: 16-Aug-1965  Tanya Graves is a 57 y.o. year old female who sees [name], MD for primary care. I spoke with  [name] by phone today.  What matters to the patients health and wellness today?  Managing type 1 diabetes    Goals Addressed             This Visit's Progress    Develop Plan of Care for management of diabetes A1C 12.7       Care Coordination Interventions: Evaluation of current treatment plan related to diabetes diabetes and patient's adherence to plan as established by provider:  Patient reports having recent hospitalization due to diabetic ketoacidosis.  She stated, " I almost died."  Patient states she was diagnosed with diabetes Type 2 in 2011 and now as of 2023 she has Diabetes type 1.  She states she is now wearing the Dexcom CGM. She states she has been experiencing  low blood sugars which have ranged from 44-300.  She reports blood sugar last night was 62.  Fasting blood sugar this am was 129.    Patient reports blood sugars She reports seeing her primary care provider on yesterday 05/30/22.  She states her Toujeo was adjusted due to her low blood sugars. Reviewed medications with patient and discussed importance of compliance Reviewed scheduled/upcoming provider appointments: patient states she has a visit scheduled with the endocrinologist on 07/22/2022 Discussed plans with patient for ongoing care management follow up and provided patient with direct contact information for care management team:  Patient verbally agreed to next telephone follow up with RNCM on 07/03/22. Discussed rule of 15 treatment for hypoglycemia  Advised patient to check blood sugar before bed and consider bedtime snack if blood sugar low.   Advised patient to notify provider if continues to have ongoing low blood sugars.           SDOH assessments and interventions completed:  No      Care Coordination Interventions:  Yes, provided   Follow up plan: Follow up call scheduled for 07/03/22    Encounter Outcome:  Pt. Visit Completed   Quinn Plowman RN,BSN,CCM [place] [phone] direct line

## 2022-05-31 NOTE — Patient Instructions (Signed)
Visit Information  Thank you for taking time to visit with me today. Please don't hesitate to contact me if I can be of assistance to you.   Following are the goals we discussed today:   Goals Addressed             This Visit's Progress    Develop Plan of Care for management of diabetes A1C 12.7       Care Coordination Interventions: Evaluation of current treatment plan related to diabetes diabetes and patient's adherence to plan as established by provider:  Patient reports having recent hospitalization due to diabetic ketoacidosis.  She stated, " I almost died."  Patient states she was diagnosed with diabetes Type 2 in 2011 and now as of 2023 she has Diabetes type 1.  She states she is now wearing the Dexcom CGM. She states she has been experiencing  low blood sugars which have ranged from 44-300.  She reports blood sugar last night was 62.  Fasting blood sugar this am was 129.    Patient reports blood sugars She reports seeing her primary care provider on yesterday 05/30/22.  She states her Toujeo was adjusted due to her low blood sugars. Reviewed medications with patient and discussed importance of compliance Reviewed scheduled/upcoming provider appointments: patient states she has a visit scheduled with the endocrinologist on 07/22/2022 Discussed plans with patient for ongoing care management follow up and provided patient with direct contact information for care management team:  Patient verbally agreed to next telephone follow up with RNCM on 07/03/22. Discussed rule of 15 treatment for hypoglycemia  Advised patient to check blood sugar before bed and consider bedtime snack if blood sugar low.   Advised patient to notify provider if continues to have ongoing low blood sugars.           Our next appointment is by telephone on 07/03/22 at 10:30 am  Please call the care guide team at [phone] if you need to cancel or reschedule your appointment.   If you are experiencing a Mental  Health or [place] or need someone to talk to, please call the Suicide and Crisis Lifeline: 988 call 1-800-273-TALK (toll free, 24 hour hotline)  Patient verbalizes understanding of instructions and care plan provided today and agrees to view in [place]. Active MyChart status and patient understanding of how to access instructions and care plan via MyChart confirmed with patient.     Quinn Plowman RN,BSN,CCM [place] Coordination [phone] direct line

## 2022-05-31 NOTE — Telephone Encounter (Signed)
Orders given.  

## 2022-06-04 LAB — BLOOD GAS, VENOUS
Acid-base deficit: 29.4 mmol/L — ABNORMAL HIGH (ref 0.0–2.0)
Bicarbonate: 2.9 mmol/L — ABNORMAL LOW (ref 20.0–28.0)
O2 Saturation: 88.7 %
Patient temperature: 37
pCO2, Ven: <18 mmHg — CL (ref 44–60)
pH, Ven: <6.95 — CL (ref 7.25–7.43)

## 2022-06-08 ENCOUNTER — Encounter: Payer: Self-pay | Admitting: Internal Medicine

## 2022-06-08 ENCOUNTER — Encounter: Payer: Self-pay | Admitting: Family Medicine

## 2022-06-11 MED ORDER — OMNIPOD 5 DEXG7G6 INTRO GEN 5 KIT: 1.0000 | PACK | 0 refills | Status: DC

## 2022-06-11 MED ORDER — OMNIPOD 5 DEXG7G6 PODS GEN 5 MISC: 1.0000 | 3 refills | Status: DC

## 2022-06-14 ENCOUNTER — Telehealth: Payer: Self-pay

## 2022-06-14 NOTE — Telephone Encounter (Signed)
Patient calling to follow up on the PA for Omnipod

## 2022-06-15 ENCOUNTER — Other Ambulatory Visit: Payer: Self-pay | Admitting: Family Medicine

## 2022-06-17 ENCOUNTER — Telehealth: Payer: Self-pay | Admitting: Pharmacy Technician

## 2022-06-17 ENCOUNTER — Other Ambulatory Visit (HOSPITAL_COMMUNITY): Payer: Self-pay

## 2022-06-17 NOTE — Telephone Encounter (Signed)
Pharmacy Patient Advocate Encounter   Received notification from CMA/Pt call msgs that prior authorization for Omnipod 5 pods is required/requested.   PA submitted on 06/17/22 to (ins) [facility] [employer] via CoverMyMeds Key [id number]  Prior Authorization has been approved.    Effective dates: 1/228/24 through 06/16/23  Test claim still not going through. Will check back later.

## 2022-06-21 ENCOUNTER — Other Ambulatory Visit: Payer: Self-pay | Admitting: Internal Medicine

## 2022-06-21 ENCOUNTER — Telehealth: Payer: Self-pay | Admitting: Dietician

## 2022-06-21 DIAGNOSIS — E1065 Type 1 diabetes mellitus with hyperglycemia: Secondary | ICD-10-CM | POA: Insufficient documentation

## 2022-06-21 NOTE — Telephone Encounter (Signed)
Patient called stating that she needs training on the Omnipod 5 insulin pump.  She uses the Dexcom G6 currently.  She has Novolog in a vial. She reports Type 2 Diabetes for a while but recent dx of type 1 diabetes.  She is very fearful.  Lows per Dexcom Clarity 2% of the time.  Reviewed treatment of hypoglycemia.  Added appointment for Omnipod 5 training next week.  She is very anxious to get this started to improve her blood glucose control.  She is not carbohydrate counting but has an awareness of carbohydrates.  Instructed her to download the [employer] app and begin carbohydrate counting.  Will add her to the Type 1 Support Group.  Will email her copies of the carb counting card, diabetes resource page, and the pump packet.  Antonieta Iba, RD, LDN, CDCES

## 2022-06-23 ENCOUNTER — Other Ambulatory Visit: Payer: Self-pay | Admitting: Internal Medicine

## 2022-06-23 ENCOUNTER — Other Ambulatory Visit: Payer: Self-pay | Admitting: Family Medicine

## 2022-06-24 ENCOUNTER — Other Ambulatory Visit: Payer: Self-pay

## 2022-06-24 MED ORDER — POTASSIUM CHLORIDE CRYS ER 20 MEQ PO TBCR: 20.0000 meq | EXTENDED_RELEASE_TABLET | Freq: Every day | ORAL | 1 refills | Status: DC | PRN

## 2022-06-24 MED ORDER — INSULIN ASPART 100 UNIT/ML IJ SOLN: 18.0000 [IU] | Freq: Three times a day (TID) | INTRAMUSCULAR | 1 refills | Status: DC

## 2022-06-24 MED ORDER — ASPIRIN 81 MG PO CHEW: 81.0000 mg | CHEWABLE_TABLET | Freq: Every day | ORAL | 1 refills | Status: DC

## 2022-06-24 MED ORDER — OMNIPOD 5 DEXG7G6 INTRO GEN 5 KIT: 1.0000 | PACK | 0 refills | Status: DC

## 2022-06-26 ENCOUNTER — Encounter: Payer: BC Managed Care – PPO | Attending: Internal Medicine | Admitting: Nutrition

## 2022-06-26 DIAGNOSIS — E1165 Type 2 diabetes mellitus with hyperglycemia: Secondary | ICD-10-CM

## 2022-06-26 DIAGNOSIS — Z794 Long term (current) use of insulin: Secondary | ICD-10-CM | POA: Insufficient documentation

## 2022-06-27 ENCOUNTER — Telehealth: Payer: Self-pay | Admitting: Nutrition

## 2022-06-27 ENCOUNTER — Telehealth: Payer: Self-pay | Admitting: Podiatry

## 2022-06-27 ENCOUNTER — Other Ambulatory Visit: Payer: Self-pay | Admitting: Physician Assistant

## 2022-06-27 DIAGNOSIS — S46012D Strain of muscle(s) and tendon(s) of the rotator cuff of left shoulder, subsequent encounter: Secondary | ICD-10-CM

## 2022-06-27 DIAGNOSIS — S42352D Displaced comminuted fracture of shaft of humerus, left arm, subsequent encounter for fracture with routine healing: Secondary | ICD-10-CM

## 2022-06-27 NOTE — Telephone Encounter (Signed)
Patient reports no difficulty giving boluses last night or this AM.  Gave a 12u bolus for supper, and had about 60 carbs.  Blood sugar dropped to 71  3.5 hours pcS.  This is more carbs than she normally eats.  Plan:  decrease supper bolus to 10u for smaller carb suppers, and 11 for high carb suppers until seeing dietitian next week.  She drank 1/2 c juice and had a handfull of nuts.  FBS today was 114.  She drank coffee and took a 0.5 bolus for this, but pump reduced this to 0.2.  Blood sugar is now 134.  Patient is very pleased with stable readings and had no questions for me at this time.  To see her next week to finish training and review readings.

## 2022-06-27 NOTE — Telephone Encounter (Signed)
Pt called and is type 1 diabetic and she thinks she has a left toe infection. She asked to see Dr Amalia Hailey in [place] and I did explain he is out of the office this week and his schedule is full. I offered her an appt today with Dr Paulla Dolly in [place] and also Monday but she has family in town and we have her scheduled for 2.6 in [place].

## 2022-06-27 NOTE — Patient Instructions (Signed)
Read over pump manuel Read over starter booklet Give a bolus for all meals and snack, making sure to enter the blood sugar reading for the bolus calculation Call OmniPod if questions about pump use Call Dexcom if questions about sensor use\ Call Office if blood sugar drop below 70 or remains above 250.

## 2022-06-27 NOTE — Progress Notes (Signed)
Patient was trained on the use of the OmniPod 5 pump.  Patient is very nervous, tearful, and anxious to start this.  Says having many lows and high, and had episode in ER in ketoacidosis with unconsciousness x2 days. We discussed how this pump delivers insulin and the need to stop all long acing insulin.  She admits taking her Toujeo last night, and the pump was put into the manual mode with 100 % basal reduction until 7PM tonight.  Written instructions given to her of what to do to put the pump back into automated mode at that time.  Settings were put into the pump per Dr. Quin Hoop orders:  basal rate: 0.85u/hr, max basal rate: 2.0u/hr, target: 120 with corrections over 150, I/C ratio: 1 (patient does not feel comfortable counting carbs at this time.  To see dietitian next week, to review this.)She was told that we will need to change this setting to 12 when she begins this, and that this will be a better indicator for meal time coverage with less lows.We also reviewed the need to add protein to all meals to prevent low before next meal.  She reports that she did not know this, and will begin doing this-with having at least 1 serving at breakfast, 2 ounces at lunch and 3-4 at supper.  Correction factor: 30, timing 4 hours and max bolus 20.  We reviewed what each of these settings mean, at her request, and she reported that it was rather confusion, but she thinks she "gets it". She filled a pod with Novolog insulin and we discussed proper placement of the pods with reference to her Dexcom sensors.  She attached the pod to her left abdomen without difficulty and the pump was started at 3;30PM in a 100% basal reduction mode until 7PM tonight.  She redemonstrated how to bolus correctly, and it was stressed that she will enter "sensor reading" each time she boluses.  We also reviewed how and when to do a correction bolus and she felt that this was all she could learn today.  We reviewed how to respond to alerts  and alarms and another appointment was made for final review of all topics in one week. She reports feeling overwhelmed with all the information given to her today, and she was told that this was normal, and would begin to feel more confident in what she is doing with each passing day.   Believe overall understanding is fair, but motivation is high to get better blood sugar control  Believe a lot of hand holding will be necessary with her.  She had no final questions

## 2022-06-29 ENCOUNTER — Telehealth: Payer: Self-pay | Admitting: Nutrition

## 2022-06-29 NOTE — Telephone Encounter (Signed)
Patient reports dropping low during night X2 due to taking 5u of extra insulin with supper meal for cupcake eaten.  Discussed again need to count carbs and she agreed to learn this in more detail in the next 2 weeks.  Blood sugars 79% in target range, and lows were no lower than 65.  She treated these appropriately as discussed.  Appointment scheduled for next week to finish pump training.  She had no questions for me at this time.

## 2022-07-02 ENCOUNTER — Ambulatory Visit: Payer: BC Managed Care – PPO | Admitting: Podiatry

## 2022-07-02 ENCOUNTER — Encounter: Payer: BC Managed Care – PPO | Attending: Internal Medicine | Admitting: Nutrition

## 2022-07-02 ENCOUNTER — Ambulatory Visit: Payer: BC Managed Care – PPO | Admitting: Internal Medicine

## 2022-07-02 DIAGNOSIS — E108 Type 1 diabetes mellitus with unspecified complications: Secondary | ICD-10-CM

## 2022-07-02 DIAGNOSIS — L600 Ingrowing nail: Secondary | ICD-10-CM

## 2022-07-02 NOTE — Patient Instructions (Signed)
Change pod every 3 days Change dexcom every 10 days Call OmniPod help line if questions about the pump Call Dexcom if questions about the sensors.

## 2022-07-02 NOTE — Progress Notes (Signed)
Patient is very please with her pump, saying she is in target 80% of time.  We finished covering all topics on the checklist--alerts, high blood sugar protocols, alarms, sick day guidelines and pod changes.  She reported good understanding of all topics and signed the checklist as understanding them.  She had no final questions.

## 2022-07-02 NOTE — Progress Notes (Signed)
Subjective:  Patient ID: Tanya Graves, female    DOB: [birth year],  MRN: [id number]  Chief Complaint  Patient presents with   Nail Problem    57 y.o. female presents with the above complaint.  Patient presents with right hallux medial border ingrown.  Painful to touch is progressive and worsens with ambulation worse with pressure she is a diabetic with last A1c of 9.7%.  Clinically it is not infected.  I discussed with her that she is a high risk of losing the toe given her A1c.  She states is painful pes cavus 5 out of 10 hurts with ambulation worse with pressure against the shoe   Review of Systems: Negative except as noted in the HPI. Denies N/V/F/Ch.  Past Medical History:  Diagnosis Date   Arthritis    Diabetes mellitus without complication (HCC)    type 2   Thyroid disease     Current Outpatient Medications:    albuterol (VENTOLIN HFA) 108 (90 Base) MCG/ACT inhaler, Inhale 2 puffs into the lungs every 6 (six) hours as needed for wheezing or shortness of breath., Disp: 8 g, Rfl: 0   aspirin 81 MG chewable tablet, Chew 1 tablet (81 mg total) by mouth daily., Disp: 90 tablet, Rfl: 1   atorvastatin (LIPITOR) 20 MG tablet, Take 4 tablets (80 mg total) by mouth daily., Disp: 90 tablet, Rfl: 3   estradiol (ESTRACE) 0.1 MG/GM vaginal cream, Place 1 Applicatorful vaginally at bedtime., Disp: , Rfl:    folic acid (FOLVITE) 1 MG tablet, Take 1 tablet (1 mg total) by mouth daily., Disp: 30 tablet, Rfl: 0   furosemide (LASIX) 20 MG tablet, Take 1 tablet (20 mg total) by mouth daily., Disp: 90 tablet, Rfl: 0   Glucagon (BAQSIMI ONE PACK) 3 MG/DOSE POWD, Place 1 Device into the nose as directed., Disp: 1 each, Rfl: 3   insulin aspart (NOVOLOG) 100 UNIT/ML injection, Inject 18 Units into the skin 3 (three) times daily with meals., Disp: 50 mL, Rfl: 1   Insulin Disposable Pump (OMNIPOD 5 G6 INTRO, GEN 5,) KIT, 1 Device by Does not apply route every other day., Disp: 1 kit, Rfl: 0   Insulin  Disposable Pump (OMNIPOD 5 G6 POD, GEN 5,) MISC, 1 Device by Does not apply route every other day., Disp: 9 each, Rfl: 3   insulin glargine, 1 Unit Dial, (TOUJEO SOLOSTAR) 300 UNIT/ML Solostar Pen, Inject 24 Units into the skin at bedtime., Disp: 3 mL, Rfl: 0   levothyroxine (SYNTHROID) 175 MCG tablet, Take 1 tablet (175 mcg total) by mouth daily with breakfast., Disp: 90 tablet, Rfl: 3   Multiple Vitamin (MULTIVITAMIN WITH MINERALS) TABS tablet, Take 1 tablet by mouth daily., Disp: 30 tablet, Rfl: 0   potassium chloride SA (KLOR-CON M) 20 MEQ tablet, Take 1 tablet (20 mEq total) by mouth daily as needed (for peripheral swelling). Take each time you take lasix., Disp: 90 tablet, Rfl: 1  Social History   Tobacco Use  Smoking Status Every Day   Packs/day: 0.25   Years: 30.00   Total pack years: 7.50   Types: Cigarettes  Smokeless Tobacco Never  Tobacco Comments   1 pack a week    No Known Allergies Objective:  There were no vitals filed for this visit. There is no height or weight on file to calculate BMI. Constitutional Well developed. Well nourished.  Vascular Dorsalis pedis pulses palpable bilaterally. Posterior tibial pulses palpable bilaterally. Capillary refill normal to all digits.  No cyanosis  or clubbing noted. Pedal hair growth normal.  Neurologic Normal speech. Oriented to person, place, and time. Epicritic sensation to light touch grossly present bilaterally.  Dermatologic Painful ingrowing nail at medial nail borders of the hallux nail right. No other open wounds. No skin lesions.  Orthopedic: Normal joint ROM without pain or crepitus bilaterally. No visible deformities. No bony tenderness.   Radiographs: None Assessment:  No diagnosis found. Plan:  Patient was evaluated and treated and all questions answered.  Ingrown Nail, right -I explained the patient that in the future she will benefit from ingrown nail procedure removal for now is not clinically  infected and is not an urgent need to remove the ingrown.  I discussed with her that make shoe gear modification and offload the from the big toenail.  She states understanding.  Given the nature of the diabetes with uncontrolled A1c of 9.7% it is not in patient's best interest to remove the ingrown.  Discussed with patient she agrees with the plan

## 2022-07-03 ENCOUNTER — Ambulatory Visit: Payer: Self-pay

## 2022-07-03 NOTE — Patient Outreach (Signed)
  Care Coordination   Follow Up Visit Note   07/03/2022 Name: Tanya Graves MRN: [id number] DOB: 01-Feb-1966  Tanya Graves is a 57 y.o. year old female who sees [name], MD for primary care. I spoke with  [name] by phone today.  What matters to the patients health and wellness today?  Managing diabetes    Goals Addressed             This Visit's Progress    Develop Plan of Care for management of diabetes A1C 12.7       Interventions Today    Flowsheet Row Most Recent Value  Chronic Disease Discussed/Reviewed   Chronic disease discussed/reviewed during today's visit Diabetes  General Interventions   General Interventions Discussed/Reviewed General Interventions Reviewed, Doctor Visits  Doctor Visits Discussed/Reviewed Doctor Visits Discussed  [Endocrinology visit scheduled 07/22/22]  Education Interventions   Education Provided Provided Printed Education, Provided Verbal Education  Provided Verbal Education On Blood Sugar Monitoring  [discussed Rule of 15 for hypoglycemia and mailed patient Basic carb counting education sheet.]  Nutrition Interventions   Nutrition Discussed/Reviewed Carbohydrate meal planning      Care Coordination Interventions: Evaluation of current treatment plan related to diabetes diabetes and patient's adherence to plan as established by provider:  Patient states she is very pleased with having the insulin pump and her DEXCOM.  She reports her fasting blood sugars range 70-200.  She states her DEXCOM is set to alarm if blood sugar drops to 70.  She reports working with the Pump educator which has helped her tremendously.  Patient states her insulin dosages are still being adjusted.  She states she has recently been within 100% of goal range at night.  Patient states she is trying to work on taking enough carbs in at her meals. Patient reports having follow up with orthopedic surgeon recently. She states she is scheduled for an MRI on 07/16/22  to determine if she has a torn rotator cuff.  Reviewed medications with patient and discussed importance of compliance Reviewed scheduled/upcoming provider appointments: patient states she has a visit scheduled with the endocrinologist on 07/22/2022 Discussed plans with patient for ongoing care management follow up and provided patient with direct contact information for care management team:  Patient verbally agreed to next telephone follow up with RNCM on 08/07/22 Discussed rule of 15 treatment for hypoglycemia  Advised to continue to follow up with pump educator or Omnipod for insulin pump concerns.  Advised patient to notify provider if continues to have ongoing low blood sugars.  Discussed basic carb counting education sheet to assist patient in meeting her meal carb goals Mailed Basic carb counting education sheet to patient Active listening and support Discussed / offered referral to social worker for expressed trauma related to recent diabetic event.           SDOH assessments and interventions completed:  No     Care Coordination Interventions:  Yes, provided   Follow up plan: Follow up call scheduled for 08/07/22    Encounter Outcome:  Pt. Visit Completed   Quinn Plowman RN,BSN,CCM [place] [phone] direct line

## 2022-07-05 ENCOUNTER — Encounter: Payer: Self-pay | Admitting: Internal Medicine

## 2022-07-05 MED ORDER — BAQSIMI ONE PACK 3 MG/DOSE NA POWD: 1.0000 | NASAL | 3 refills | Status: DC

## 2022-07-16 ENCOUNTER — Telehealth: Payer: Self-pay | Admitting: Family Medicine

## 2022-07-16 ENCOUNTER — Ambulatory Visit
Admission: RE | Admit: 2022-07-16 | Discharge: 2022-07-16 | Disposition: A | Discharge status: Home / Self Care | Payer: BC Managed Care – PPO | Source: Ambulatory Visit | Attending: Physician Assistant | Admitting: Physician Assistant

## 2022-07-16 DIAGNOSIS — Z0279 Encounter for issue of other medical certificate: Secondary | ICD-10-CM

## 2022-07-16 DIAGNOSIS — S42352D Displaced comminuted fracture of shaft of humerus, left arm, subsequent encounter for fracture with routine healing: Secondary | ICD-10-CM

## 2022-07-16 DIAGNOSIS — S46012D Strain of muscle(s) and tendon(s) of the rotator cuff of left shoulder, subsequent encounter: Secondary | ICD-10-CM

## 2022-07-16 NOTE — Telephone Encounter (Signed)
Type of forms received:Medical Evaluation  Routed [id number] Pool  Paperwork received by : Gwynn Burly   Individual made aware of 3-5 business day turn around (Y/N): Y  Form completed and patient made aware of charges(Y/N): Y   Faxed to :   Form location: Place in PCP folder

## 2022-07-16 NOTE — Telephone Encounter (Signed)
Medical Evaluation for  Division of Social Services placed in Dr. Rometta Emery office in box to complete.

## 2022-07-17 ENCOUNTER — Other Ambulatory Visit: Payer: Self-pay | Admitting: Internal Medicine

## 2022-07-17 ENCOUNTER — Encounter: Payer: Self-pay | Admitting: Internal Medicine

## 2022-07-17 DIAGNOSIS — E1165 Type 2 diabetes mellitus with hyperglycemia: Secondary | ICD-10-CM

## 2022-07-17 NOTE — Telephone Encounter (Signed)
Adeleine notified by telephone that paperwork is ready to be picked up at the front desk.

## 2022-07-18 ENCOUNTER — Other Ambulatory Visit: Payer: Self-pay

## 2022-07-18 MED ORDER — DEXCOM G6 TRANSMITTER MISC: 3 refills | Status: DC

## 2022-07-18 MED ORDER — DEXCOM G6 SENSOR MISC: 6 refills | Status: DC

## 2022-07-22 ENCOUNTER — Ambulatory Visit: Payer: BC Managed Care – PPO | Admitting: Internal Medicine

## 2022-08-06 ENCOUNTER — Telehealth: Payer: Self-pay | Admitting: Nutrition

## 2022-08-06 NOTE — Telephone Encounter (Signed)
Blood sugar rising in last 2-4 days, both after breakfast and lunch, and not sure why, Says she has been eating less carbs, but It turns out she is eating much more fat.  She ate 40 grams of fat because she has been eating atkins bars that are high in fat and low in carbs.  She was told to not eat these for breakfast tomorrow and see if blood sugars have less of a dramatic rise in herblood sugars.

## 2022-08-07 ENCOUNTER — Ambulatory Visit: Payer: Self-pay

## 2022-08-07 NOTE — Patient Outreach (Signed)
  Care Coordination   Follow Up Visit Note   08/07/2022 Name: Tanya Graves MRN: [id number] DOB: 02-21-1966  Tanya Graves is a 57 y.o. year old female who sees Tanya Sanders, MD for primary care. I spoke with  Tanya Graves by phone today.  What matters to the patients health and wellness today?  Patient reports she is happy with her insulin pump.  She states she is managing well and has ongoing support from nurse diabetic educator.  Patient reports blood sugar ranges 62 to 207.  Patient reports having a few low blood sugars in the middle of the night.  Patient states she is scheduled to see the endocrinologist on 08/08/22.    Goals Addressed             This Visit's Progress    Develop Plan of Care for management of diabetes A1C 12.7       Interventions Today    Flowsheet Row Most Recent Value  Chronic Disease   Chronic disease during today's visit Diabetes  [Evaluation of current treatment plan related to diabetes and patients adherence to plan as established by provider.]  General Interventions   General Interventions Discussed/Reviewed General Interventions Reviewed, Doctor Visits  [facility] assessed blood sugar range.  Assessed patients level of comfort using DEXCOM/ insulin pump. Discussed plans for ongoing care coordination follow up with RNCM]  Doctor Visits Discussed/Reviewed Doctor Visits Reviewed  Tanya Graves scheduled / upcoming provider appointments.]  Exercise Interventions   Exercise Discussed/Reviewed Exercise Reviewed  [Encouraged exercising at least 3 times per week.  Advised to pay attention to blood sugars level when exercising and report frequent lows blood sugars to provider.]  Education Interventions   Education Provided Provided Education  Provided Verbal Education On Other  [Discussed hypoglycemic management/ treatment]  Nutrition Interventions   Nutrition Discussed/Reviewed Carbohydrate meal planning, Nutrition Reviewed  [Had patient explain current  meal plan.]  Pharmacy Interventions   Pharmacy Dicussed/Reviewed Pharmacy Topics Reviewed  [medications reviewed and compliance discussed.]                SDOH assessments and interventions completed:  No     Care Coordination Interventions:  Yes, provided   Follow up plan: Follow up call scheduled for 09/10/22    Encounter Outcome:  Pt. Visit Completed

## 2022-08-08 ENCOUNTER — Encounter: Payer: Self-pay | Admitting: Internal Medicine

## 2022-08-08 ENCOUNTER — Ambulatory Visit: Payer: BC Managed Care – PPO | Admitting: Internal Medicine

## 2022-08-08 VITALS — BP 124/80 | HR 80 | Ht 68.0 in | Wt 251.0 lb

## 2022-08-08 DIAGNOSIS — E890 Postprocedural hypothyroidism: Secondary | ICD-10-CM

## 2022-08-08 DIAGNOSIS — E785 Hyperlipidemia, unspecified: Secondary | ICD-10-CM | POA: Diagnosis not present

## 2022-08-08 DIAGNOSIS — E1065 Type 1 diabetes mellitus with hyperglycemia: Secondary | ICD-10-CM

## 2022-08-08 DIAGNOSIS — E139 Other specified diabetes mellitus without complications: Secondary | ICD-10-CM | POA: Insufficient documentation

## 2022-08-08 DIAGNOSIS — E1169 Type 2 diabetes mellitus with other specified complication: Secondary | ICD-10-CM

## 2022-08-08 LAB — POCT GLYCOSYLATED HEMOGLOBIN (HGB A1C): Hemoglobin A1C: 7.5 % — AB (ref 4.0–5.6)

## 2022-08-08 LAB — LIPID PANEL
Cholesterol: 135 mg/dL (ref 0–200)
HDL: 70.7 mg/dL (ref >39.00)
LDL Cholesterol: 54 mg/dL (ref 0–99)
NonHDL: 64.16
Total CHOL/HDL Ratio: 2
Triglycerides: 52 mg/dL (ref 0.0–149.0)
VLDL: 10.4 mg/dL (ref 0.0–40.0)

## 2022-08-08 LAB — BASIC METABOLIC PANEL
BUN: 23 mg/dL (ref 6–23)
CO2: 27 mEq/L (ref 19–32)
Calcium: 9.5 mg/dL (ref 8.4–10.5)
Chloride: 105 mEq/L (ref 96–112)
Creatinine, Ser: 0.9 mg/dL (ref 0.40–1.20)
GFR: 71.93 mL/min (ref >60.00)
Glucose, Bld: 210 mg/dL — ABNORMAL HIGH (ref 70–99)
Potassium: 4.1 mEq/L (ref 3.5–5.1)
Sodium: 139 mEq/L (ref 135–145)

## 2022-08-08 LAB — MICROALBUMIN / CREATININE URINE RATIO
Creatinine,U: 96.2 mg/dL
Microalb Creat Ratio: 0.9 mg/g (ref 0.0–30.0)
Microalb, Ur: 0.9 mg/dL (ref 0.0–1.9)

## 2022-08-08 MED ORDER — SEMAGLUTIDE(0.25 OR 0.5MG/DOS) 2 MG/3ML ~~LOC~~ SOPN: 0.5000 mg | PEN_INJECTOR | SUBCUTANEOUS | 11 refills | Status: DC

## 2022-08-08 NOTE — Patient Instructions (Addendum)
Start Ozempic 0.25 mg once weekly, after 6 weeks, increase to 0.5 mg once weekly   - HOW TO TREAT LOW BLOOD SUGARS (Blood sugar LESS THAN 70 MG/DL) Please follow the RULE OF 15 for the treatment of hypoglycemia treatment (when your (blood sugars are less than 70 mg/dL)   STEP 1: Take 15 grams of carbohydrates when your blood sugar is low, which includes:  3-4 GLUCOSE TABS  OR 3-4 OZ OF JUICE OR REGULAR SODA OR ONE TUBE OF GLUCOSE GEL    STEP 2: RECHECK blood sugar in 15 MINUTES STEP 3: If your blood sugar is still low at the 15 minute recheck --> then, go back to STEP 1 and treat AGAIN with another 15 grams of carbohydrates.

## 2022-08-08 NOTE — Progress Notes (Signed)
Name: Tanya Graves  Age/ Sex: 57 y.o., female   MRN/ DOB: [id number], 1966-01-31     PCP: [name], MD   Reason for Endocrinology Evaluation: Type 2 Diabetes Mellitus  Initial Endocrine Consultative Visit: 07/08/2018    PATIENT IDENTIFIER: Tanya Graves is a 57 y.o. female with a past medical history of T2DM, HTN, Dyslipidemia and hypothyroidism . The patient has followed with Endocrinology clinic since 07/08/2018 for consultative assistance with management of her diabetes.  DIABETIC HISTORY:  Tanya Graves was diagnosed with T2DM ~ 2010.  She does not recall intolerance to other oral glycemic agents.  On her initial visit here she was on MDI regimen of insulin and metformin. Her hemoglobin A1c has ranged from 11.4%  in 2016, peaking at 12.5% in 2019  GLP-1 and SGLT-2 inhibitors have been cost prohibitive in the past    She has been off metformin since October 2022  DKA 12/2021  Her diagnosis was changed to Type 1 DM with an elevated GAD-65 > 250  ( <5IU/mL and islet Ab 80 ( reference <1.25)  I prescribed mounjaro before her LADA Dx but this was discontinued after hospitalization for DKA in 04/2022 and we opted to start Ozempic 07/2022   Thyroid Disease   She is S/P RAI ablation years ago > 20 yrs ago and has been on levothyroxine . She is on Levothyroxine 175 mcg daily   SUBJECTIVE:   During the last visit (08/15/2021): A1c 11.2% .     Today (08/08/2022): Tanya Graves for a follow-up on diabetes management.  She checks her glucose multiple times a day through CGM   Since her last visit here, the patient presented to the ED 04/2022 for DKA, following treatment for sinus infection she is status post injury of the left shoulder with proximal humerus fracture. She had DKA in 04/2022 with collapse with left shoulder fracture   She follows with Ortho through [facility] clinic Patient has been noted with weight gain   This patient with type 1 diabetes is treated with  Omnipod (insulin pump). During the visit the pump basal and bolus doses were reviewed including carb/insulin rations and supplemental doses. The clinical list was updated. The glucose meter download was reviewed in detail to determine if the current pump settings are providing the best glycemic control without excessive hypoglycemia.  Pump and meter download:    Pump   Omnipod Settings   Insulin type   Novolog   Basal rate       0000 0.8 u/h               I:C ratio       0000 1:1                  Sensitivity       0000  30      Goal       0000  120             PUMP STATISTICS: Average BG: 173  Average Daily Carbs (g): 39  Average Total Daily Insulin: 75.9  Average Daily Basal: 37.8 (50 %) Average Daily Bolus: 38 (50 %)     CONTINUOUS GLUCOSE MONITORING RECORD INTERPRETATION    Dates of Recording: 3/1-3/14/2024  Sensor description:dexcom  Results statistics:   CGM use % of time 92.9  Average and SD 173/75  Time in range     65   %  % Time Above 180 19  %  Time above 250 14  % Time Below target 2   Glycemic patterns summary: Glucose optimal overnight with fluctuating BG's during the day   Hyperglycemic episodes  postprandial   Hypoglycemic episodes occurred variable   Overnight periods: optimal      HOME DIABETES REGIMEN:  Novolog        DIABETIC COMPLICATIONS: Microvascular complications:  Neuropathy , worsening DR Denies: CKD  Last eye exam: Completed  07/17/2020   Macrovascular complications:    Denies: CAD, PVD, CVA    HISTORY:  Past Medical History:  Past Medical History:  Diagnosis Date   Arthritis    Diabetes mellitus without complication ([place])    type 2   Thyroid disease    Past Surgical History:  Past Surgical History:  Procedure Laterality Date   APPENDECTOMY  1993   CALCANEAL OSTEOTOMY Left 07/17/2016   Procedure: CALCANEAL OSTEOTOMY;  Surgeon: [name], MD;  Location: [facility] ORS;  Service: Orthopedics;   Laterality: Left;   COLONOSCOPY  12/18/2018   found 1 7mm polyp, going back in 5 years    COLONOSCOPY WITH PROPOFOL N/A 12/18/2018   Procedure: COLONOSCOPY WITH PROPOFOL;  Surgeon: [name], MD;  Location: [facility] ENDOSCOPY;  Service: Gastroenterology;  Laterality: N/A;   IRRIGATION AND DEBRIDEMENT ABSCESS N/A 01/24/2015   Procedure: IRRIGATION AND DEBRIDEMENT ABSCESS/GROIN ABSCESS;  Surgeon: [name], MD;  Location: [facility] ORS;  Service: General;  Laterality: N/A;   KNEE ARTHROSCOPY W/ MENISCAL REPAIR Right 2008   ROTATOR CUFF REPAIR Right 2015   TONSILLECTOMY  1998   Social History:  reports that she has been smoking cigarettes. She has a 7.50 pack-year smoking history. She has never used smokeless tobacco. She reports current alcohol use. She reports that she does not use drugs. Family History:  Family History  Problem Relation Age of Onset   Diabetes Father    Heart failure Father    Diabetes Brother    Arthritis Mother    Diabetes Mother    Transient ischemic attack Mother    Heart failure Maternal Grandmother    Alcohol abuse Maternal Grandmother    Diabetes Paternal Grandmother    Kidney failure Paternal Grandmother    Heart failure Paternal Grandfather    Cancer Paternal Grandfather        unknown type   Hypertension Brother    Breast cancer Maternal Great-grandmother     OBJECTIVE:   Vital Signs:BP 124/80 (BP Location: Left Arm, Patient Position: Sitting, Cuff Size: Large)   Pulse 80   Ht 5\' 8"  (1.727 m)   Wt 251 lb (113.9 kg)   LMP 06/28/2015 (Approximate)   SpO2 96%   BMI 38.16 kg/m   Wt Readings from Last 3 Encounters:  08/08/22 251 lb (113.9 kg)  05/30/22 263 lb 6 oz (119.5 kg)  05/17/22 250 lb 6 oz (113.6 kg)     Exam: General: Pt appears well and is in NAD  Lungs: Clear with good BS bilat   Heart: RRR   Extremities: No pretibial edema.   Neuro: MS is good with appropriate affect, pt is alert and Ox3   DM foot exam: 08/08/2022 The skin  of the feet is intact without sores or ulcerations. The pedal pulses are 2+ on right and 2+ on left. The sensation is intact to a screening 5.07, 10 gram monofilament bilaterally     DATA REVIEWED:  Lab Results  Component Value Date   HGBA1C 7.5 (A) 08/08/2022   HGBA1C 12.7 (H) 02/14/2022  HGBA1C 11.2 (A) 08/15/2021    Latest Reference Range & Units 02/25/22 12:06  TSH 0.35 - 5.50 uIU/mL 1.09      Latest Reference Range & Units 01/07/22 10:02  Sodium 135 - 145 mmol/L 134 (L)  Potassium 3.5 - 5.1 mmol/L 4.5  Chloride 98 - 111 mmol/L 103  CO2 22 - 32 mmol/L 23  Glucose 70 - 99 mg/dL 360 (H)  BUN 6 - 20 mg/dL 23 (H)  Creatinine 0.44 - 1.00 mg/dL 0.97  Calcium 8.9 - 10.3 mg/dL 8.5 (L)  Anion gap 5 - 15  8  GFR, Estimated >60 mL/min >60   ISLET CELL ANTIBODY SCREEN NEGATIVE POSITIVE   Glutamic Acid Decarb Ab <5 IU/mL >250 High     ASSESSMENT / PLAN / RECOMMENDATIONS:  1)Type 1 Diabetes Mellitus (LADA) , suboptimally controlled, With Neuropathic and retinopathic complications - Most recent A1c of 7.5%. Goal A1c < 7.0 %.    -Her A1c has trended down from 12.7% to 7.5% -She is very happy with the OmniPod and Dexcom use -I have recommended carb counting, which she is in agreement of, at this well help improve tailor her care -I referral to our CDE for carbohydrate counting has been sent - Historically SGLT-2 inhibitors ( farxiga in 2020) and GLP-1 agonists ( Rybelsus in 2021) have been cost prohibitive , but now with her new diagnosis and 2 DKA episodes, SGLT2 inhibitors are to be avoided -Intolerant to metformin -In reviewing her Dexcom/OmniPod download the patient has been noted with hypoglycemic episodes after bolus, I will adjust her insulin to correction factor  MEDICATIONS: Ozempic 0.25 mg weekly for 6 weeks, increase to 0.5 mg weekly NovoLog   Pump   Omnipod Settings   Insulin type   Novolog   Basal rate       0000 0.8 u/h               I:C ratio        0000 1:1                  Sensitivity       0000  30      Goal       0000  120        EDUCATION / INSTRUCTIONS: BG monitoring instructions: Patient is instructed to check her blood sugars 4 times a day, before meals and bedtime. Call [facility] if: BG persistently < 70  I reviewed the Rule of 15 for the treatment of hypoglycemia in detail with the patient. Literature supplied.  2) Post ablative hypothyroidism:  -Patient is clinically euthyroid -TSH above goal, will increase levothyroxine as below  Medication Stop levothyroxine 175 MCG daily Start levothyroxine 200 mcg daily   3) Dyslipidemia :    -Lipid panel is optimal -Patient is on atorvastatin  F/U in 4 months     Signed electronically by: Mack Guise, MD  [facility]  [address], [place] [address] Phone: [phone] FAX: [phone]   CC: [name], MD [place] [address] Phone: [phone]  Fax: [phone]  Return to Endocrinology clinic as below: Future Appointments  Date Time Provider [facility]  09/10/2022 11:00 AM Dannielle Karvonen, RN [facility] None  10/31/2022  3:00 PM Daejah Klebba, Melanie Crazier, MD [facility] None

## 2022-08-09 ENCOUNTER — Encounter: Payer: Self-pay | Admitting: Internal Medicine

## 2022-08-09 LAB — TSH: TSH: 5.29 u[IU]/mL (ref 0.35–5.50)

## 2022-08-09 MED ORDER — LEVOTHYROXINE SODIUM 200 MCG PO TABS: 200.0000 ug | ORAL_TABLET | Freq: Every day | ORAL | 3 refills | Status: DC

## 2022-08-12 MED ORDER — ROSUVASTATIN CALCIUM 40 MG PO TABS: 40.0000 mg | ORAL_TABLET | Freq: Every day | ORAL | 3 refills | Status: DC

## 2022-08-12 NOTE — Addendum Note (Signed)
Addended by: Dorita Sciara on: 08/12/2022 08:18 AM   Modules accepted: Orders

## 2022-09-09 ENCOUNTER — Other Ambulatory Visit: Payer: Self-pay | Admitting: Nurse Practitioner

## 2022-09-09 ENCOUNTER — Other Ambulatory Visit: Payer: Self-pay | Admitting: Family Medicine

## 2022-09-09 DIAGNOSIS — R062 Wheezing: Secondary | ICD-10-CM

## 2022-09-09 MED ORDER — FUROSEMIDE 20 MG PO TABS: 20.0000 mg | ORAL_TABLET | Freq: Every day | ORAL | 0 refills | Status: DC

## 2022-09-09 MED ORDER — ALBUTEROL SULFATE HFA 108 (90 BASE) MCG/ACT IN AERS: 2.0000 | INHALATION_SPRAY | Freq: Four times a day (QID) | RESPIRATORY_TRACT | 0 refills | Status: DC | PRN

## 2022-09-09 MED ORDER — ESTRADIOL 0.1 MG/GM VA CREA: 1.0000 | TOPICAL_CREAM | Freq: Every day | VAGINAL | 0 refills | Status: DC

## 2022-09-09 NOTE — Telephone Encounter (Signed)
Patient sched 4/25 CPE and asked for same day labs. Also says her endocrinologist did labs a few weeks back and wondered if she still needed to do labs prior to CPE.

## 2022-09-09 NOTE — Telephone Encounter (Signed)
I reviewed her labs from Endo.  She will not need to do labs for Dr. Ermalene Searing.

## 2022-09-09 NOTE — Telephone Encounter (Signed)
Please schedule CPE with fasting labs prior with Dr. Bedsole.  

## 2022-09-10 ENCOUNTER — Other Ambulatory Visit: Payer: Self-pay | Admitting: Internal Medicine

## 2022-09-10 ENCOUNTER — Ambulatory Visit: Payer: Self-pay

## 2022-09-10 ENCOUNTER — Telehealth: Payer: Self-pay | Admitting: Family Medicine

## 2022-09-10 NOTE — Patient Outreach (Signed)
  Care Coordination   09/10/2022 Name: Tanya Graves MRN: [id number] DOB: 11/05/1965   Care Coordination Outreach Attempts:  An unsuccessful telephone outreach was attempted for a scheduled appointment today. HIPAA compliant voice message left with call back phone number and return call request.   Follow Up Plan:  Additional outreach attempts will be made to offer the patient care coordination information and services.   Encounter Outcome:  No Answer   Care Coordination Interventions:  No, not indicated    George Ina [facility] Care Coordination [phone] direct line

## 2022-09-10 NOTE — Telephone Encounter (Signed)
Patient returned a call to the office regarding a care coordination appt. Would like a call back whenever possible, please advise [phone].

## 2022-09-11 ENCOUNTER — Encounter: Payer: Self-pay | Admitting: Internal Medicine

## 2022-09-11 ENCOUNTER — Ambulatory Visit: Payer: BC Managed Care – PPO | Admitting: Family Medicine

## 2022-09-11 ENCOUNTER — Telehealth: Payer: Self-pay | Admitting: Nutrition

## 2022-09-11 NOTE — Telephone Encounter (Signed)
Patient reports that she is sick with a bad cold and congestion, and is going to the doctor on Friday.   She has been following the sick day guidelines that were given to her when she started on the pump.  She has no fever.  She is not vomiting, and is drinking "a lot of fluids and eating protein in the form of yogurt, sandwiches, and eggs.  Just feeling week and tired.  Blood sugars are sometimes going into the 300s, but she is doing correction doses q 2-3 hours, and the pump is not allowing her to take any more insulin.  I explained that she is doing everything as she should.  She was reminded to go the ER if vomiting or not able to keep any liquids down.  She said she is going to the drugstore to get ketone test strips, and I  suggested she be tested for Covid to rule this out.  She agreed to do this and had no final questions.Marland Kitchen

## 2022-09-12 ENCOUNTER — Other Ambulatory Visit: Payer: Self-pay | Admitting: Internal Medicine

## 2022-09-12 MED ORDER — INSULIN ASPART 100 UNIT/ML IJ SOLN: INTRAMUSCULAR | 3 refills | Status: DC

## 2022-09-13 ENCOUNTER — Ambulatory Visit: Payer: BC Managed Care – PPO | Admitting: Family Medicine

## 2022-09-13 ENCOUNTER — Telehealth: Payer: Self-pay | Admitting: *Deleted

## 2022-09-13 ENCOUNTER — Encounter: Payer: Self-pay | Admitting: Family Medicine

## 2022-09-13 VITALS — BP 110/64 | HR 96 | Temp 97.7°F | Ht 68.0 in | Wt 248.2 lb

## 2022-09-13 DIAGNOSIS — R051 Acute cough: Secondary | ICD-10-CM | POA: Diagnosis not present

## 2022-09-13 DIAGNOSIS — E1065 Type 1 diabetes mellitus with hyperglycemia: Secondary | ICD-10-CM | POA: Diagnosis not present

## 2022-09-13 LAB — POC INFLUENZA A&B (BINAX/QUICKVUE)
Influenza A, POC: NEGATIVE
Influenza B, POC: NEGATIVE

## 2022-09-13 LAB — POC COVID19 BINAXNOW: SARS Coronavirus 2 Ag: NEGATIVE

## 2022-09-13 MED ORDER — AZITHROMYCIN 250 MG PO TABS: ORAL_TABLET | ORAL | 0 refills | Status: DC

## 2022-09-13 NOTE — Patient Instructions (Addendum)
Keep up with water intake.  Continue to follow blood sugars closely.. contact ENDO if  increasing pout of control.  Use mucinex DM  twice daily for cough. Can use flonase 2 sprays per nostril daily.  If not improving in 48-72 hours... complete the course of antibiotics.

## 2022-09-13 NOTE — Assessment & Plan Note (Signed)
Acute, most likely viral upper respiratory tract infection/bronchitis. Negative COVID and flu testing. Will start with rest fluids and symptomatic care.  If not improving as expected she will complete a course of antibiotics. Will hold off on any prednisone given her diabetes.

## 2022-09-13 NOTE — Progress Notes (Signed)
Patient ID: Tanya Graves, female    DOB: 27-Aug-1965, 57 y.o.   MRN: [id number]  This visit was conducted in person.  BP 110/64   Pulse 96   Temp 97.7 F (36.5 C) (Temporal)   Ht  (1.727 m)   Wt 248 lb 4 oz (112.6 kg)   LMP 06/28/2015 (Approximate)   SpO2 97%   BMI 37.75 kg/m    CC:  Chief Complaint  Patient presents with   Fatigue    Started Tuesday Night   Nasal Congestion   Cough    With loose phlegm   Headache   Hyperglycemia    Subjective:   HPI: Tanya Graves is a 57 y.o. female presenting on 09/13/2022 for Fatigue (Started Tuesday Night), Nasal Congestion, Cough (With loose phlegm), Headache, and Hyperglycemia   Date of onset:   09/10/22 Initial symptoms included  fatigue,  mild body aches Symptoms progressed to nasal congestion and  cough,  productive moist  No fever measured  but sweaty at night   No face pain, no ST, no ear pain   Some  wheeze intermittently some mild DOB but better with clearing the mucus.   Daily Average 150-195... has increased the insulin basal to double. On pump and has contacted  diabetic pump coordinator to work with her about the  pump to avoid DKA.  Sick contacts:  daughter with allergies COVID testing:   none    Watching diet closely.   She has tried to treat with alkaseltzer  cough med... slee[ping through the night.     No history of chronic lung disease such as asthma or COPD. Non-smoker.       Relevant past medical, surgical, family and social history reviewed and updated as indicated. Interim medical history since our last visit reviewed. Allergies and medications reviewed and updated. Outpatient Medications Prior to Visit  Medication Sig Dispense Refill   albuterol (VENTOLIN HFA) 108 (90 Base) MCG/ACT inhaler Inhale 2 puffs into the lungs every 6 (six) hours as needed for wheezing or shortness of breath. 8 g 0   Continuous Blood Gluc Sensor (DEXCOM G6 SENSOR) MISC Change sensors every 10 days 3 each 6    Continuous Blood Gluc Transmit (DEXCOM G6 TRANSMITTER) MISC Change every 90 days 1 each 3   estradiol (ESTRACE) 0.1 MG/GM vaginal cream Place 1 Applicatorful vaginally at bedtime. 42.5 g 0   furosemide (LASIX) 20 MG tablet Take 1 tablet (20 mg total) by mouth daily. 90 tablet 0   Glucagon (BAQSIMI ONE PACK) 3 MG/DOSE POWD Place 1 Device into the nose as directed. 1 each 3   insulin aspart (NOVOLOG) 100 UNIT/ML injection Max daily 110 units per pump 110 mL 3   Insulin Disposable Pump (OMNIPOD 5 G6 POD, GEN 5,) MISC 1 Device by Does not apply route every other day. 9 each 3   levothyroxine (SYNTHROID) 200 MCG tablet Take 1 tablet (200 mcg total) by mouth daily. 90 tablet 3   rosuvastatin (CRESTOR) 40 MG tablet Take 1 tablet (40 mg total) by mouth daily. 90 tablet 3   Semaglutide,0.25 or 0.5MG /DOS, 2 MG/3ML SOPN Inject 0.5 mg into the skin once a week. 3 mL 11   aspirin 81 MG chewable tablet Chew 1 tablet (81 mg total) by mouth daily. 90 tablet 1   Insulin Disposable Pump (OMNIPOD 5 G6 INTRO, GEN 5,) KIT 1 Device by Does not apply route every other day. 1 kit 0   Multiple Vitamin (  MULTIVITAMIN WITH MINERALS) TABS tablet Take 1 tablet by mouth daily. 30 tablet 0   potassium chloride SA (KLOR-CON M) 20 MEQ tablet Take 1 tablet (20 mEq total) by mouth daily as needed (for peripheral swelling). Take each time you take lasix. 90 tablet 1   No facility-administered medications prior to visit.     Per HPI unless specifically indicated in ROS section below Review of Systems  Constitutional:  Positive for fatigue. Negative for chills and fever.  HENT:  Positive for congestion and postnasal drip. Negative for sinus pressure, sneezing and sore throat.   Eyes:  Negative for pain.  Respiratory:  Positive for cough, shortness of breath and wheezing.   Cardiovascular:  Negative for chest pain, palpitations and leg swelling.  Gastrointestinal:  Negative for abdominal pain.  Genitourinary:  Negative for  dysuria and vaginal bleeding.  Musculoskeletal:  Positive for myalgias. Negative for back pain.  Neurological:  Negative for syncope, light-headedness and headaches.  Psychiatric/Behavioral:  Negative for dysphoric mood.    Objective:  BP 110/64   Pulse 96   Temp 97.7 F (36.5 C) (Temporal)   Ht 5\' 8"  (1.727 m)   Wt 248 lb 4 oz (112.6 kg)   LMP 06/28/2015 (Approximate)   SpO2 97%   BMI 37.75 kg/m   Wt Readings from Last 3 Encounters:  09/13/22 248 lb 4 oz (112.6 kg)  08/08/22 251 lb (113.9 kg)  05/30/22 263 lb 6 oz (119.5 kg)      Physical Exam Constitutional:      General: She is not in acute distress.    Appearance: She is well-developed. She is not ill-appearing or toxic-appearing.  HENT:     Head: Normocephalic.     Right Ear: Hearing, tympanic membrane, ear canal and external ear normal. Tympanic membrane is not erythematous, retracted or bulging.     Left Ear: Hearing, tympanic membrane, ear canal and external ear normal. Tympanic membrane is not erythematous, retracted or bulging.     Nose: Mucosal edema and rhinorrhea present.     Right Sinus: No maxillary sinus tenderness or frontal sinus tenderness.     Left Sinus: No maxillary sinus tenderness or frontal sinus tenderness.     Mouth/Throat:     Pharynx: Uvula midline.  Eyes:     General: Lids are normal. Lids are everted, no foreign bodies appreciated.     Conjunctiva/sclera: Conjunctivae normal.     Pupils: Pupils are equal, round, and reactive to light.  Neck:     Thyroid: No thyroid mass or thyromegaly.     Vascular: No carotid bruit.     Trachea: Trachea normal.  Cardiovascular:     Rate and Rhythm: Normal rate and regular rhythm.     Pulses: Normal pulses.     Heart sounds: Normal heart sounds, S1 normal and S2 normal. No murmur heard.    No friction rub. No gallop.  Pulmonary:     Effort: Pulmonary effort is normal. No tachypnea or respiratory distress.     Breath sounds: Wheezing present. No  decreased breath sounds, rhonchi or rales.     Comments:  Scattered wheeze with expiration Musculoskeletal:     Cervical back: Normal range of motion and neck supple.  Skin:    General: Skin is warm and dry.     Findings: No rash.  Neurological:     Mental Status: She is alert.  Psychiatric:        Mood and Affect: Mood is not anxious or  depressed.        Speech: Speech normal.        Behavior: Behavior normal. Behavior is cooperative.        Judgment: Judgment normal.       Results for orders placed or performed in visit on 08/08/22  TSH  Result Value Ref Range   TSH 5.29 0.35 - 5.50 uIU/mL  Microalbumin / creatinine urine ratio  Result Value Ref Range   Microalb, Ur 0.9 0.0 - 1.9 mg/dL   Creatinine,U 16.1 mg/dL   Microalb Creat Ratio 0.9 0.0 - 30.0 mg/g  Lipid panel  Result Value Ref Range   Cholesterol 135 0 - 200 mg/dL   Triglycerides 09.6 0.0 - 149.0 mg/dL   HDL 04.54 >09.81 mg/dL   VLDL 19.1 0.0 - 47.8 mg/dL   LDL Cholesterol 54 0 - 99 mg/dL   Total CHOL/HDL Ratio 2    NonHDL 64.16   Basic metabolic panel  Result Value Ref Range   Sodium 139 135 - 145 mEq/L   Potassium 4.1 3.5 - 5.1 mEq/L   Chloride 105 96 - 112 mEq/L   CO2 27 19 - 32 mEq/L   Glucose, Bld 210 (H) 70 - 99 mg/dL   BUN 23 6 - 23 mg/dL   Creatinine, Ser 2.95 0.40 - 1.20 mg/dL   GFR 62.13 >08.65 mL/min   Calcium 9.5 8.4 - 10.5 mg/dL  POCT glycosylated hemoglobin (Hb A1C)  Result Value Ref Range   Hemoglobin A1C 7.5 (A) 4.0 - 5.6 %   HbA1c POC (<> result, manual entry)     HbA1c, POC (prediabetic range)     HbA1c, POC (controlled diabetic range)      Assessment and Plan so annoying to have to  Acute cough Assessment & Plan: Acute, most likely viral upper respiratory tract infection/bronchitis. Negative COVID and flu testing. Will start with rest fluids and symptomatic care.  If not improving as expected she will complete a course of antibiotics. Will hold off on any prednisone given her  diabetes.    Orders: -     POC Influenza A&B(BINAX/QUICKVUE) -     POC COVID-19 BinaxNow  Type 1 diabetes mellitus with hyperglycemia Assessment & Plan: Chronic, moderate control Pump adjusting given elevation in blood sugars with current infection.  She is in contact with pump coordinator.  Continuing to work on low carbohydrate and increasing water.   Other orders -     Azithromycin; 2 tab po x 1 day then 1 tab po daily  Dispense: 6 tablet; Refill: 0    No follow-ups on file.   Kerby Nora, MD

## 2022-09-13 NOTE — Progress Notes (Signed)
  Care Coordination Note  09/13/2022 Name: Tanya Graves MRN: [id number] DOB: 1965-11-15  Tanya Graves is a 58 y.o. year old female who is a primary care patient of Ermalene Searing, Amy E, MD and is actively engaged with the care management team. I reached out to Achille Rich by phone today to assist with re-scheduling a follow up visit with the RN Case Manager  Follow up plan: Pt requests call back next week   Burman Nieves, [facility] Care Coordination Care Guide Direct Dial: [phone]

## 2022-09-13 NOTE — Assessment & Plan Note (Signed)
Chronic, moderate control Pump adjusting given elevation in blood sugars with current infection.  She is in contact with pump coordinator.  Continuing to work on low carbohydrate and increasing water.

## 2022-09-16 ENCOUNTER — Other Ambulatory Visit: Payer: Self-pay | Admitting: Family Medicine

## 2022-09-19 ENCOUNTER — Encounter: Payer: BC Managed Care – PPO | Admitting: Family Medicine

## 2022-09-20 ENCOUNTER — Ambulatory Visit: Payer: BC Managed Care – PPO | Admitting: Podiatry

## 2022-09-25 ENCOUNTER — Ambulatory Visit: Payer: BC Managed Care – PPO | Admitting: Podiatry

## 2022-09-25 ENCOUNTER — Ambulatory Visit (INDEPENDENT_AMBULATORY_CARE_PROVIDER_SITE_OTHER): Payer: BC Managed Care – PPO

## 2022-09-25 DIAGNOSIS — M79672 Pain in left foot: Secondary | ICD-10-CM

## 2022-09-25 DIAGNOSIS — M79675 Pain in left toe(s): Secondary | ICD-10-CM

## 2022-09-25 DIAGNOSIS — M79674 Pain in right toe(s): Secondary | ICD-10-CM | POA: Diagnosis not present

## 2022-09-25 DIAGNOSIS — M7662 Achilles tendinitis, left leg: Secondary | ICD-10-CM

## 2022-09-25 DIAGNOSIS — B351 Tinea unguium: Secondary | ICD-10-CM

## 2022-09-25 MED ORDER — BETAMETHASONE SOD PHOS & ACET 6 (3-3) MG/ML IJ SUSP
3.0000 mg | Freq: Once | INTRAMUSCULAR | Status: AC
  Administered 2022-09-25: 3 mg via INTRA_ARTICULAR

## 2022-09-25 MED ORDER — MELOXICAM 15 MG PO TABS: 15.0000 mg | ORAL_TABLET | Freq: Every day | ORAL | 0 refills | Status: DC

## 2022-09-25 MED ORDER — MELOXICAM 15 MG PO TABS: 15.0000 mg | ORAL_TABLET | Freq: Every day | ORAL | 1 refills | Status: DC

## 2022-09-25 NOTE — Progress Notes (Signed)
   Chief Complaint  Patient presents with   Diabetes    Patient came in today for left foot pain and diabetic foot care,  nail fungus, A1c-7.3 BG- 102    Neuropathy, X-Rays done today    SUBJECTIVE Patient presents to office today complaining of elongated, thickened nails that cause pain while ambulating in shoes.  Patient also has a history of ingrown toenail.  She would like to have it evaluated today.  Currently she has noticed pain or tenderness associated to the ingrown toenail.  Patient also states that she has pain and tenderness associated to the posterior heel of the calcaneus.  She says that she does have a history of posterior heel spur resection to the left lower extremity.  She would like to have it evaluated today.  Past Medical History:  Diagnosis Date   Arthritis    Diabetes mellitus without complication ([facility])    type 2   Thyroid disease     No Known Allergies   OBJECTIVE General Patient is awake, alert, and oriented x 3 and in no acute distress. Derm Skin is dry and supple bilateral. Negative open lesions or macerations. Remaining integument unremarkable. Nails are tender, long, thickened and dystrophic with subungual debris, consistent with onychomycosis, 1-5 bilateral. No signs of infection noted. Vasc  DP and PT pedal pulses palpable bilaterally. Temperature gradient within normal limits.  Neuro light touch and protective threshold diminished Musculoskeletal Exam No symptomatic pedal deformities noted bilateral.  Enlargement of the posterior tubercle of the calcaneus left with associated tenderness to palpation along posterior heel Radiographic exam LT foot 09/25/2022 Large posterior heel spur noted on lateral view along the posterior tubercle of the calcaneus.  Calcifications noted within the Achilles tendon.  ASSESSMENT 1.  Pain due to onychomycosis of toenails both 2.  T2DM.  A1c 08/08/2022 7.5 3.  Chronic Achilles tendinitis/tendinosis with posterior heel spur  and calcifications within the tendon left  PLAN OF CARE 1. Patient evaluated today.  2.  Stressed the importance of maintaining and managing her diabetes.  A1c's have improved 3. Mechanical debridement of nails 1-5 bilaterally performed using a nail nipper. Filed with dremel without incident.  4.  Injection of 0.5 cc Celestone Soluspan injected along the posterior aspect of the left heel.  Care taken to avoid injection into the Achilles tendon 5.  Prescription for meloxicam 15 mg daily 6.  Return to clinic as needed   Felecia Shelling, DPM [facility]  Dr. Felecia Shelling, DPM    [address] [place], [place] [address]                Office [phone]  Fax [phone]

## 2022-09-26 ENCOUNTER — Other Ambulatory Visit: Payer: Self-pay | Admitting: Family Medicine

## 2022-09-27 ENCOUNTER — Encounter: Payer: BC Managed Care – PPO | Attending: Internal Medicine | Admitting: Dietician

## 2022-09-27 DIAGNOSIS — E108 Type 1 diabetes mellitus with unspecified complications: Secondary | ICD-10-CM | POA: Diagnosis present

## 2022-09-27 NOTE — Telephone Encounter (Signed)
Refill request estradiol vaginal cream Last refill 09/09/22  42.5 G Last office visit 09/13/22

## 2022-09-27 NOTE — Progress Notes (Unsigned)
261 lbs 1600  Patient would like to lose weight. She would also like to learn more about carbohydrate counting. Recent cortisone shot for foot.   Complains of anal leakage on Ozempic Tries to avoid over treating highs and lows.  History includes:  Type 1 diabetes/LADA, HTN, dyslipidemia, hypothyroidism, peripheral neuropathy  Medications include:   Labs noted to include:  A1C 7.5% 08/08/2022 decreased from 12.7% 01/2022 Insulin pump:  Omnipod 5 CGM:  Dexcom G6   CGM Results from download:   % Time CGM active:   100 %   (Goal >70%)  Average glucose:   179 mg/dL for 14 days  Glucose management indicator:   7.5 %  Time in range (70-180 mg/dL):   60 %   (Goal >16%)  Time High (181-250 mg/dL):   23 %   (Goal < 10%)  Time Very High (>250 mg/dL):    14 %   (Goal < 5%)  Time Low (54-69 mg/dL):   2 %   (Goal <9%)  Time Very Low (<54 mg/dL):   <1 %   (Goal <6%)   Weight hx:  261 lbs 09/27/2022 215 lbs 04/2022 289 lbs 06/2020  Patient lives with her daughter.  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  She has a standing desk (uses 15% of her time).  She sets a timer to remind her to get up.  She tracks her steps and gets between 4000-10000 steps on the weekend. Walks usually but not recently due to the pain in her heal.  Has a standing desk and aims to stand for part of the day.   She used to do [employer] but has not found a location currently.  She has been on [employer] for years. She has increased knee pain at times.  MP card Resouces F/u August

## 2022-09-27 NOTE — Patient Instructions (Addendum)
Continue to stay active.    Continue to do your Physical Therapy exercises daily  Consider core work - see online for videos  Mindfulness:  Consistently scheduled meal - avoid skipping  Choices  Eat slowly  Away from distraction (sitting in kitchen or dining room)  Stop eating when satisfied  Before a snack ask, "Am I hungry or eating for another reason?"   "What can I do instead if I am not hungry?"  Try to find something every day that brings you joy! Consider calorie density

## 2022-10-08 ENCOUNTER — Other Ambulatory Visit: Payer: Self-pay | Admitting: Podiatry

## 2022-10-08 DIAGNOSIS — M7662 Achilles tendinitis, left leg: Secondary | ICD-10-CM

## 2022-10-08 DIAGNOSIS — M79675 Pain in left toe(s): Secondary | ICD-10-CM

## 2022-10-08 DIAGNOSIS — M79672 Pain in left foot: Secondary | ICD-10-CM

## 2022-10-11 NOTE — Progress Notes (Signed)
  Care Coordination Note  10/11/2022 Name: TAI CHIAO MRN: [id number] DOB: [birth year]  LALAINE SCHICKER is a 57 y.o. year old female who is a primary care patient of Ermalene Searing, Amy E, MD and is actively engaged with the care management team. I reached out to Achille Rich by phone today to assist with re-scheduling a follow up visit with the RN Case Manager  Follow up plan: We have been unable to make contact with the patient for follow up.   Burman Nieves, CCMA Care Coordination Care Guide Direct Dial: [phone]

## 2022-10-14 ENCOUNTER — Encounter: Payer: Self-pay | Admitting: Podiatry

## 2022-10-16 ENCOUNTER — Encounter: Payer: Self-pay | Admitting: Internal Medicine

## 2022-10-16 DIAGNOSIS — E139 Other specified diabetes mellitus without complications: Secondary | ICD-10-CM

## 2022-10-16 MED ORDER — TIRZEPATIDE 5 MG/0.5ML ~~LOC~~ SOAJ: 5.0000 mg | SUBCUTANEOUS | 3 refills | Status: DC

## 2022-10-18 ENCOUNTER — Ambulatory Visit (INDEPENDENT_AMBULATORY_CARE_PROVIDER_SITE_OTHER): Payer: BC Managed Care – PPO

## 2022-10-18 ENCOUNTER — Ambulatory Visit (INDEPENDENT_AMBULATORY_CARE_PROVIDER_SITE_OTHER): Payer: BC Managed Care – PPO | Admitting: Podiatry

## 2022-10-18 DIAGNOSIS — S86012A Strain of left Achilles tendon, initial encounter: Secondary | ICD-10-CM | POA: Diagnosis not present

## 2022-10-18 DIAGNOSIS — M7662 Achilles tendinitis, left leg: Secondary | ICD-10-CM

## 2022-10-18 NOTE — Progress Notes (Signed)
   Chief Complaint  Patient presents with   Foot Pain    LEFT FOOT HEEL PAIN, SWELLING     SUBJECTIVE Patient presenting today for follow-up evaluation of Achilles tendon pain to the left lower extremity.  Patient states that she purchased a new pair of shoes and earlier this week she took a misstep in her home.  She says that her heel slipped and she heard a pop and had immediate pain to the posterior heel.  Concern for possible rupture.  She presents for further treatment and evaluation  Past Medical History:  Diagnosis Date   Arthritis    Diabetes mellitus without complication (HCC)    type 2   Thyroid disease     No Known Allergies   OBJECTIVE General Patient is awake, alert, and oriented x 3 and in no acute distress. Derm Skin is dry and supple bilateral.  Normal skin integument. Vasc  DP and PT pedal pulses palpable bilaterally. Temperature gradient within normal limits.  Neuro grossly intact via light touch Musculoskeletal Exam tenderness with palpation overlying the posterior lateral tubercle of the calcaneus left.  There are some associated edema around the area as well enlargement of the posterior tubercle of the calcaneus left with associated tenderness to palpation along posterior heel.  Patient has plantarflexion strength with integrity of the Achilles with plantarflexion of the ankle against resistance Radiographic exam LT foot 09/25/2022 Large posterior heel spur noted on lateral view along the posterior tubercle of the calcaneus.  Calcifications noted within the Achilles tendon. Radiographic exam LT foot 10/18/2022 There does appear to be some retraction of the calcifications within the Achilles tendon concerning for possible partial tear. Diminished visualization of the [place] on lateral view  ASSESSMENT 1. T2DM.  A1c 08/08/2022 7.5 2.  Chronic Achilles tendinitis/tendinosis with posterior heel spur and calcifications within the tendon left 3.  Concern for  possible partial Achilles rupture left 4.  History of Achilles tendon repair and posterior heel spur resection left  -Patient evaluated.  X-rays reviewed and compared to prior x-rays -MRI LT heel wo contrast ordered -Patient has a cam boot at home.  WBAT -Explained to the patient I am concerned for possible partial rupture of the Achilles. -Return to clinic after MRI to review the results and discuss further treatment options which may include revisional surgery  Felecia Shelling, DPM [facility]  Dr. Felecia Shelling, DPM    [address] [place], [place] [address]                Office [phone]  Fax [phone]

## 2022-10-22 ENCOUNTER — Other Ambulatory Visit: Payer: Self-pay | Admitting: Internal Medicine

## 2022-10-24 ENCOUNTER — Telehealth: Payer: Self-pay | Admitting: *Deleted

## 2022-10-24 NOTE — Telephone Encounter (Signed)
error 

## 2022-10-29 ENCOUNTER — Telehealth: Payer: Self-pay

## 2022-10-29 ENCOUNTER — Other Ambulatory Visit (HOSPITAL_COMMUNITY): Payer: Self-pay

## 2022-10-29 NOTE — Telephone Encounter (Signed)
Patient Advocate Encounter   Received notification from [facility] that prior authorization is required for [facility]  Submitted: 10/29/22 Key [id number]  Waiting on clinical questions to populate

## 2022-10-30 NOTE — Telephone Encounter (Signed)
Clinical information sent to insurance 10/30/22

## 2022-10-31 ENCOUNTER — Encounter: Payer: Self-pay | Admitting: Podiatry

## 2022-10-31 ENCOUNTER — Encounter: Payer: Self-pay | Admitting: Internal Medicine

## 2022-10-31 ENCOUNTER — Ambulatory Visit (INDEPENDENT_AMBULATORY_CARE_PROVIDER_SITE_OTHER): Payer: BC Managed Care – PPO | Admitting: Internal Medicine

## 2022-10-31 VITALS — BP 126/72 | HR 94 | Ht 68.0 in | Wt 249.0 lb

## 2022-10-31 DIAGNOSIS — E1169 Type 2 diabetes mellitus with other specified complication: Secondary | ICD-10-CM

## 2022-10-31 DIAGNOSIS — E1065 Type 1 diabetes mellitus with hyperglycemia: Secondary | ICD-10-CM | POA: Diagnosis not present

## 2022-10-31 DIAGNOSIS — E785 Hyperlipidemia, unspecified: Secondary | ICD-10-CM | POA: Diagnosis not present

## 2022-10-31 DIAGNOSIS — E890 Postprocedural hypothyroidism: Secondary | ICD-10-CM

## 2022-10-31 DIAGNOSIS — E139 Other specified diabetes mellitus without complications: Secondary | ICD-10-CM

## 2022-10-31 DIAGNOSIS — E1069 Type 1 diabetes mellitus with other specified complication: Secondary | ICD-10-CM

## 2022-10-31 LAB — POCT GLYCOSYLATED HEMOGLOBIN (HGB A1C): Hemoglobin A1C: 7.6 % — AB (ref 4.0–5.6)

## 2022-10-31 NOTE — Patient Instructions (Signed)

## 2022-10-31 NOTE — Progress Notes (Signed)
Name: Tanya Graves  Age/ Sex: 57 y.o., female   MRN/ DOB: [id number], [birth year]     PCP: Excell Seltzer, MD   Reason for Endocrinology Evaluation: Type 2 Diabetes Mellitus  Initial Endocrine Consultative Visit: 07/08/2018    PATIENT IDENTIFIER: Tanya Graves is a 57 y.o. female with a past medical history of T2DM, HTN, Dyslipidemia and hypothyroidism . The patient has followed with Endocrinology clinic since 07/08/2018 for consultative assistance with management of her diabetes.  DIABETIC HISTORY:  Tanya Graves was diagnosed with T2DM ~ 2010.  She does not recall intolerance to other oral glycemic agents.  On her initial visit here she was on MDI regimen of insulin and metformin. Her hemoglobin A1c has ranged from 11.4%  in 2016, peaking at 12.5% in 2019  GLP-1 and SGLT-2 inhibitors have been cost prohibitive in the past    She has been off metformin since October 2022  DKA 12/2021  Her diagnosis was changed to Type 1 DM with an elevated GAD-65 > 250  ( <5IU/mL and islet Ab 80 ( reference <1.25)  I prescribed mounjaro before her LADA Dx but this was discontinued after hospitalization for DKA in 04/2022 and we opted to start Ozempic 07/2022  Intolerant to Ozempic-GI side effects  We started [facility] 10/2022  Thyroid Disease   She is S/P RAI ablation years ago > 20 yrs ago and has been on levothyroxine . She is on Levothyroxine 175 mcg daily   SUBJECTIVE:   During the last visit (08/15/2021): A1c 11.2% .     Today (10/31/2022): Tanya Graves for a follow-up on diabetes management.  She checks her glucose multiple times a day through CGM    She follows with Ortho through [facility] for humeral fracture, no need to revision sx, has improved with PT   She has a left boot foot, for bursitis, she received intra-articular injection due to bone spur , pending MRI . Also received left wrist pain and  tingling  Has not been sleeping well at night  Has noted cravings     This patient with type 1 diabetes is treated with Omnipod (insulin pump). During the visit the pump basal and bolus doses were reviewed including carb/insulin rations and supplemental doses. The clinical list was updated. The glucose meter download was reviewed in detail to determine if the current pump settings are providing the best glycemic control without excessive hypoglycemia.  Pump and meter download:    Pump   Omnipod Settings   Insulin type   Novolog   Basal rate       0000 0.85 u/h               I:C ratio       0000 1:1                  Sensitivity       0000  30      Goal       0000  120             PUMP STATISTICS: Average BG: 172 Average Daily Carbs (g): 35.3 Average Total Daily Insulin: 78.4 Average Daily Basal: 40.5 (52 %) Average Daily Bolus: 37.9 (48 %)     CONTINUOUS GLUCOSE MONITORING RECORD INTERPRETATION    Dates of Recording: 5/24 - 10/31/2022  Sensor description:dexcom  Results statistics:   CGM use % of time 100  Average and SD 171/76  Time in range 65%  %  Time Above 180 19  % Time above 250 13  % Time Below target 2   Glycemic patterns summary: BG's are optimal overnight, trend up during the day and decrease again later in the evening  Hyperglycemic episodes  postprandial   Hypoglycemic episodes occurred at night/early hours of the morning  Overnight periods: optimal      HOME DIABETES REGIMEN:  Novolog  Mounjaro 5 mg weekly  Rosuvastatin 40 mg daily      DIABETIC COMPLICATIONS: Microvascular complications:  Neuropathy , worsening DR Denies: CKD  Last eye exam: Completed  07/17/2020   Macrovascular complications:    Denies: CAD, PVD, CVA    HISTORY:  Past Medical History:  Past Medical History:  Diagnosis Date   Arthritis    Diabetes mellitus without complication (HCC)    type 2   Thyroid disease    Past Surgical History:  Past Surgical History:  Procedure Laterality Date   APPENDECTOMY   1993   CALCANEAL OSTEOTOMY Left 07/17/2016   Procedure: CALCANEAL OSTEOTOMY;  Surgeon: Deeann Saint, MD;  Location: [facility] ORS;  Service: Orthopedics;  Laterality: Left;   COLONOSCOPY  12/18/2018   found 1 3mm polyp, going back in 5 years    COLONOSCOPY WITH PROPOFOL N/A 12/18/2018   Procedure: COLONOSCOPY WITH PROPOFOL;  Surgeon: Wyline Mood, MD;  Location: [facility] ENDOSCOPY;  Service: Gastroenterology;  Laterality: N/A;   IRRIGATION AND DEBRIDEMENT ABSCESS N/A 01/24/2015   Procedure: IRRIGATION AND DEBRIDEMENT ABSCESS/GROIN ABSCESS;  Surgeon: Ida Rogue, MD;  Location: [facility] ORS;  Service: General;  Laterality: N/A;   KNEE ARTHROSCOPY W/ MENISCAL REPAIR Right 2008   ROTATOR CUFF REPAIR Right 2015   TONSILLECTOMY  1998   Social History:  reports that she has quit smoking. Her smoking use included cigarettes. She started smoking about 5 months ago. She has a 7.50 pack-year smoking history. She has never used smokeless tobacco. She reports current alcohol use. She reports that she does not use drugs. Family History:  Family History  Problem Relation Age of Onset   Diabetes Father    Heart failure Father    Diabetes Brother    Arthritis Mother    Diabetes Mother    Transient ischemic attack Mother    Heart failure Maternal Grandmother    Alcohol abuse Maternal Grandmother    Diabetes Paternal Grandmother    Kidney failure Paternal Grandmother    Heart failure Paternal Grandfather    Cancer Paternal Grandfather        unknown type   Hypertension Brother    Breast cancer Maternal Great-grandmother     OBJECTIVE:   Vital Signs:BP 126/72 (BP Location: Left Arm, Patient Position: Sitting, Cuff Size: Large)   Pulse 94   Ht 5\' 8"  (1.727 m)   Wt 249 lb (112.9 kg)   LMP 06/28/2015 (Approximate)   SpO2 96%   BMI 37.86 kg/m   Wt Readings from Last 3 Encounters:  10/31/22 249 lb (112.9 kg)  09/13/22 248 lb 4 oz (112.6 kg)  08/08/22 251 lb (113.9 kg)     Exam: General: Pt  appears well and is in NAD  Lungs: Clear with good BS bilat   Heart: RRR   Neuro: MS is good with appropriate affect, pt is alert and Ox3   DM foot exam: 08/08/2022 The skin of the feet is intact without sores or ulcerations. The pedal pulses are 2+ on right and 2+ on left. The sensation is intact to a screening 5.07, 10 gram monofilament bilaterally  DATA REVIEWED:  Lab Results  Component Value Date   HGBA1C 7.6 (A) 10/31/2022   HGBA1C 7.5 (A) 08/08/2022   HGBA1C 12.7 (H) 02/14/2022    Latest Reference Range & Units 02/25/22 12:06  TSH 0.35 - 5.50 uIU/mL 1.09      Latest Reference Range & Units 01/07/22 10:02  Sodium 135 - 145 mmol/L 134 (L)  Potassium 3.5 - 5.1 mmol/L 4.5  Chloride 98 - 111 mmol/L 103  CO2 22 - 32 mmol/L 23  Glucose 70 - 99 mg/dL 161 (H)  BUN 6 - 20 mg/dL 23 (H)  Creatinine 0.96 - 1.00 mg/dL 0.45  Calcium 8.9 - 40.9 mg/dL 8.5 (L)  Anion gap 5 - 15  8  GFR, Estimated >60 mL/min >60   ISLET CELL ANTIBODY SCREEN NEGATIVE POSITIVE   Glutamic Acid Decarb Ab <5 IU/mL >250 High     ASSESSMENT / PLAN / RECOMMENDATIONS:  1)Type 1 Diabetes Mellitus (LADA) , sub-optimally controlled, With Neuropathic and retinopathic complications - Most recent A1c of 7.6%. Goal A1c < 7.0 %.    -A1c is stable but continues to be above goal -In reviewing her CGM data, the patient has been noted with hypoglycemia in the early hours of the morning, I will reduce her basal rate -She is tolerating Mounjaro, she is comfortable with the current dose, will not change at this time as she is continuing to lose weight -She has been noted with inconsistent bolus during the day, she typically boluses between 5-12 g per meal, I did again encourage the patient to start carb counting  -SGLT2 inhibitors are to be avoided due to risk of DKA -Intolerant to metformin    MEDICATIONS: Mounjaro 5 mg weekly  NovoLog   Pump   Omnipod Settings   Insulin type   Novolog   Basal rate        0000 0.80 u/h               I:C ratio       0000 1:1    8-12 g TID               Sensitivity       0000  30      Goal       0000  120        EDUCATION / INSTRUCTIONS: BG monitoring instructions: Patient is instructed to check her blood sugars 4 times a day, before meals and bedtime. Call [facility] if: BG persistently < 70  I reviewed the Rule of 15 for the treatment of hypoglycemia in detail with the patient. Literature supplied.  2) Post ablative hypothyroidism:  Patient on Levothyroxine 200 mcg daily   3) Dyslipidemia :    -Historically she has had an optimal lipid panel -Patient was on 4 tabs of atorvastatin , which I switched to rosuvastatin 40 mg in March 2024    rosuvastatin 40 mg daily   F/U in 4 months   I spent 32 minutes preparing to see the patient by review of recent labs, imaging and procedures, obtaining and reviewing separately obtained history, communicating with the patient, ordering medications,  and documenting clinical information in the EHR including the differential Dx, treatment, and any further evaluation and other management    Signed electronically by: Lyndle Herrlich, MD  [facility]  [facility] [address] [place] [place], [place] [address] Phone: [phone] FAX: [phone]   CC: Excell Seltzer, MD [address]  [address] [place] [address] Phone: [phone]  Fax: [phone]  Return to Endocrinology clinic as below: Future Appointments  Date Time Provider Department Center  11/06/2022  5:50 PM [facility] [place] MRI 1 [facility]  12/27/2022  4:00 PM Jobe, Rolin Barry, RD [facility] N[facility]

## 2022-11-01 ENCOUNTER — Other Ambulatory Visit: Payer: Self-pay | Admitting: Family Medicine

## 2022-11-01 DIAGNOSIS — R062 Wheezing: Secondary | ICD-10-CM

## 2022-11-04 ENCOUNTER — Other Ambulatory Visit: Payer: Self-pay

## 2022-11-04 ENCOUNTER — Encounter: Payer: Self-pay | Admitting: Internal Medicine

## 2022-11-04 DIAGNOSIS — R062 Wheezing: Secondary | ICD-10-CM

## 2022-11-05 ENCOUNTER — Telehealth: Payer: Self-pay | Admitting: *Deleted

## 2022-11-05 NOTE — Progress Notes (Signed)
  Care Coordination Note  11/05/2022 Name: Tanya Graves MRN: [id number] DOB: 04/19/1966  Tanya Graves is a 57 y.o. year old female who is a primary care patient of Ermalene Searing, Amy E, MD and is actively engaged with the care management team. I reached out to Achille Rich by phone today to assist with re-scheduling a follow up visit with the RN Case Manager  Follow up plan: Pt will call back to schedule   Burman Nieves, [facility] Care Coordination Care Guide Direct Dial: [phone]

## 2022-11-06 ENCOUNTER — Ambulatory Visit
Admission: RE | Admit: 2022-11-06 | Discharge: 2022-11-06 | Disposition: A | Discharge status: Home / Self Care | Payer: BC Managed Care – PPO | Source: Ambulatory Visit | Attending: Podiatry | Admitting: Podiatry

## 2022-11-06 DIAGNOSIS — S86012A Strain of left Achilles tendon, initial encounter: Secondary | ICD-10-CM

## 2022-11-06 DIAGNOSIS — M7662 Achilles tendinitis, left leg: Secondary | ICD-10-CM

## 2022-11-06 NOTE — Telephone Encounter (Signed)
Pharmacy Patient Advocate Encounter  Received notification that the request for prior authorization has been denied      

## 2022-11-07 NOTE — Telephone Encounter (Signed)
Left message informing patient of denial and options.

## 2022-11-08 NOTE — Telephone Encounter (Signed)
Patient would like the referral to be sent

## 2022-11-08 NOTE — Addendum Note (Signed)
Addended by: Scarlette Shorts on: 11/08/2022 10:10 AM   Modules accepted: Orders

## 2022-11-15 ENCOUNTER — Ambulatory Visit (INDEPENDENT_AMBULATORY_CARE_PROVIDER_SITE_OTHER): Payer: BC Managed Care – PPO | Admitting: Podiatry

## 2022-11-15 VITALS — BP 145/82 | HR 88

## 2022-11-15 DIAGNOSIS — M7662 Achilles tendinitis, left leg: Secondary | ICD-10-CM

## 2022-11-15 NOTE — Progress Notes (Signed)
   Chief Complaint  Patient presents with   Foot Pain    "I'm here for my MRI results and I want him to check my blister on the other foot."    SUBJECTIVE Patient presenting today for follow-up evaluation of Achilles tendon pain to the left lower extremity.  MRI completed on 11/06/2022.  She presents today to review the MRI results and discuss further treatment options which likely will include surgery.  Currently she has been WBAT in the cam boot as instructed.  Past Medical History:  Diagnosis Date   Arthritis    Diabetes mellitus without complication (HCC)    type 2   Thyroid disease     No Known Allergies   OBJECTIVE General Patient is awake, alert, and oriented x 3 and in no acute distress. Derm Skin is dry and supple bilateral.  Normal skin integument. Vasc  DP and PT pedal pulses palpable bilaterally. Temperature gradient within normal limits.  Neuro grossly intact via light touch Musculoskeletal Exam there continues to be tenderness with palpation overlying the posterior lateral tubercle of the calcaneus left.  There are some associated edema around the area as well enlargement of the posterior tubercle of the calcaneus left with associated tenderness to palpation along posterior heel.  Patient has plantarflexion strength with integrity of the Achilles with plantarflexion of the ankle against resistance Radiographic exam LT foot 09/25/2022 Large posterior heel spur noted on lateral view along the posterior tubercle of the calcaneus.  Calcifications noted within the Achilles tendon. Radiographic exam LT foot 10/18/2022 There does appear to be some retraction of the calcifications within the Achilles tendon concerning for possible partial tear. Diminished visualization of the [place] on lateral view MR HEEL LEFT WO CONTRAST 11/06/2022 Final read pending.  MRI reviewed personally and there is partial rupture of the Achilles tendon with retraction consistent with at least  partial tearing of the Achilles  ASSESSMENT 1. T2DM.  A1c 08/08/2022 7.5 2.  Chronic Achilles tendinitis/tendinosis with posterior heel spur and calcifications within the tendon left 3.  Concern for possible partial Achilles rupture left 4.  History of Achilles tendon repair and posterior heel spur resection left  -Patient evaluated.  Continue minimal WBAT cam boot -After reviewing the MRI and x-rays I do believe the patient would benefit from surgical repair of the Achilles tendon with resection of the posterior heel spurs.  Risk benefits advantages and disadvantages as well as the postoperative recovery course were explained in detail to the patient.  No guarantees were expressed or implied.  All patient questions answered.  Patient understands that she will be nonweightbearing about 6 weeks postoperatively using a knee scooter. -After discussing with the patient she agrees and is amenable to this plan -Authorization for surgery was initiated today.  Surgery will consist of posterior heel spur resection with primary repair of Achilles tendon left. -Return to clinic 1 week postop  *Works in an office setting.  Felecia Shelling, DPM [facility]  Dr. Felecia Shelling, DPM    [address] [place], [place] [address]                Office [phone]  Fax [phone]

## 2022-11-22 NOTE — Progress Notes (Signed)
  Care Coordination  Outreach Note  11/22/2022 Name: Tanya Graves MRN: [id number] DOB: 09/07/1965   Care Coordination Outreach Attempts: A second unsuccessful outreach was attempted today to offer the patient with information about available care coordination services. Re establishing   Follow Up Plan:  No further outreach attempts will be made at this time. We have been unable to contact the patient to offer or enroll patient in care coordination services  Encounter Outcome:  No Answer  Burman Nieves, [facility] Care Coordination Care Guide Direct Dial: [phone]

## 2022-11-26 ENCOUNTER — Encounter: Payer: Self-pay | Admitting: Podiatry

## 2022-12-04 ENCOUNTER — Other Ambulatory Visit: Payer: Self-pay | Admitting: Family Medicine

## 2022-12-04 NOTE — Telephone Encounter (Signed)
Lvmtcb, sent mychart message  

## 2022-12-04 NOTE — Telephone Encounter (Signed)
Please schedule CPE with fasting labs prior with Dr. Bedsole.  

## 2022-12-17 ENCOUNTER — Other Ambulatory Visit: Payer: Self-pay | Admitting: Family Medicine

## 2022-12-17 NOTE — Telephone Encounter (Signed)
Should patient be taking Klor-Kon given she is on Lasix??  Looks like I discontinued it at 09/13/22 visit stating completed course.  Please advise.

## 2022-12-27 ENCOUNTER — Encounter: Payer: Self-pay | Admitting: Internal Medicine

## 2022-12-27 ENCOUNTER — Encounter: Payer: Self-pay | Admitting: Podiatry

## 2022-12-27 ENCOUNTER — Ambulatory Visit: Payer: BC Managed Care – PPO | Admitting: Dietician

## 2022-12-27 ENCOUNTER — Telehealth: Payer: Self-pay | Admitting: Dietician

## 2022-12-27 NOTE — Telephone Encounter (Signed)
Patient called as she needed to miss her appointment today to pick up her daughter. She states that she has a surgery on her achilies tendon on 01/16/2023 and wants to get advice about what do do with her pump and CGM during surgery.  She has an android phone and wants to learn how to use her phone as the PDM as well.  GMI per Dexcom is 7.2 which is better than it has been in a long time.  Encouraged her to continue the hard work.  Appointment rescheduled with Bonita Quin for next week.  Oran Rein, RD, LDN, CDCES

## 2022-12-31 ENCOUNTER — Other Ambulatory Visit: Payer: Self-pay | Admitting: Family Medicine

## 2023-01-01 ENCOUNTER — Encounter: Payer: BC Managed Care – PPO | Attending: Internal Medicine | Admitting: Nutrition

## 2023-01-01 DIAGNOSIS — E108 Type 1 diabetes mellitus with unspecified complications: Secondary | ICD-10-CM | POA: Insufficient documentation

## 2023-01-02 NOTE — Progress Notes (Signed)
Patient is here today because she is having left ankle surgery in two weeks and had questions about pump wear and sensor placements for this.  Since she will be lying face down, it was suggested she place the pod on her right side, so she is not laying on it, above her waist, and place her sensor on her left arm, so that it will communicate easily with her pod.  She was encouraged to charge her PDM the night before and write her unlock code on her pdm and show the OR nurse how to open this to view the blood sugar readings.  Suggested she get some glucose tablets to use that morning if blood sugars drop low before surgery.  It was explained that these will get absorbed into the blood stream directly from her stomach, and not enter her GI tract, causing nausea. She reported good understanding of this. And had no final questions.

## 2023-01-03 ENCOUNTER — Telehealth: Payer: Self-pay | Admitting: Urology

## 2023-01-03 NOTE — Telephone Encounter (Signed)
DOS - 01/16/23  HEEL SPUR RESECTION LEFT --- [id number] ACHILLES REPAIR LEFT --- [id number]  [employer]  SPOKE WITH LADONNA WITH [employer] AND SHE STATED THAT FOR CPR CODES 19147 AND [phone] NO PRIOR AUTH IS REQUIRED.  CALL REF # [id number] M. 01/03/23 AT 8:43 AM EST

## 2023-01-09 ENCOUNTER — Other Ambulatory Visit: Payer: Self-pay | Admitting: Internal Medicine

## 2023-01-09 ENCOUNTER — Other Ambulatory Visit: Payer: Self-pay | Admitting: Obstetrics and Gynecology

## 2023-01-09 ENCOUNTER — Other Ambulatory Visit: Payer: Self-pay | Admitting: Family Medicine

## 2023-01-09 DIAGNOSIS — N951 Menopausal and female climacteric states: Secondary | ICD-10-CM

## 2023-01-10 ENCOUNTER — Telehealth: Payer: Self-pay | Admitting: Obstetrics and Gynecology

## 2023-01-10 ENCOUNTER — Other Ambulatory Visit: Payer: Self-pay

## 2023-01-10 ENCOUNTER — Other Ambulatory Visit: Payer: Self-pay | Admitting: Family Medicine

## 2023-01-10 DIAGNOSIS — R062 Wheezing: Secondary | ICD-10-CM

## 2023-01-10 NOTE — Telephone Encounter (Signed)
I contacted the patient via phone. She is due for a pap only appointment with Dr. Logan Bores per CMA TB advise to contact the patient for scheduling. I left message for the patient to call back to scheduling.

## 2023-01-13 NOTE — Telephone Encounter (Signed)
Please schedule CPE with fasting labs prior with Dr. Ermalene Searing.  Per AVS:   Return in about 5 months (around 08/25/2022) for  Physical with fasting labs prior.  Once scheduled, please send back to me refill medication.

## 2023-01-13 NOTE — Telephone Encounter (Signed)
Lvmtcb, sent mychart message  

## 2023-01-16 ENCOUNTER — Other Ambulatory Visit: Payer: Self-pay | Admitting: Podiatry

## 2023-01-16 DIAGNOSIS — M7662 Achilles tendinitis, left leg: Secondary | ICD-10-CM | POA: Diagnosis not present

## 2023-01-16 MED ORDER — MELOXICAM 15 MG PO TABS: 15.0000 mg | ORAL_TABLET | Freq: Every day | ORAL | 1 refills | Status: DC

## 2023-01-16 MED ORDER — OXYCODONE-ACETAMINOPHEN 5-325 MG PO TABS: 1.0000 | ORAL_TABLET | ORAL | 0 refills | Status: DC | PRN

## 2023-01-16 NOTE — Progress Notes (Signed)
PRN postop 

## 2023-01-24 ENCOUNTER — Encounter: Payer: Self-pay | Admitting: Podiatry

## 2023-01-24 ENCOUNTER — Ambulatory Visit (INDEPENDENT_AMBULATORY_CARE_PROVIDER_SITE_OTHER): Payer: BC Managed Care – PPO

## 2023-01-24 ENCOUNTER — Ambulatory Visit (INDEPENDENT_AMBULATORY_CARE_PROVIDER_SITE_OTHER): Payer: BC Managed Care – PPO | Admitting: Podiatry

## 2023-01-24 DIAGNOSIS — M7662 Achilles tendinitis, left leg: Secondary | ICD-10-CM

## 2023-01-24 DIAGNOSIS — Z9889 Other specified postprocedural states: Secondary | ICD-10-CM | POA: Diagnosis not present

## 2023-01-24 NOTE — Progress Notes (Signed)
   Chief Complaint  Patient presents with   Routine Post Op    POV # 1 DOS 01/16/2023 --- POSTERIOR HEEL SPUR RESECTION LEFT, REPAIR OF ACHILLES TENDON LEFT "I think I broke the bottom of my cast.  I think it's doing really good.  I haven't taken the pain medication since the second day.  The only thing that bothers me is that it feels like something is constantly in my arch."    Subjective:  Patient presents today status post retrocalcaneal exostectomy with repair of Achilles tendon left lower extremity.  DOS: 01/16/2023.  Patient doing well.  NWB with the knee scooter.  Cast is intact.  Pain minimal.  Past Medical History:  Diagnosis Date   Arthritis    Diabetes mellitus without complication (HCC)    type 2   Thyroid disease     Past Surgical History:  Procedure Laterality Date   APPENDECTOMY  1993   CALCANEAL OSTEOTOMY Left 07/17/2016   Procedure: CALCANEAL OSTEOTOMY;  Surgeon: Deeann Saint, MD;  Location: [facility] ORS;  Service: Orthopedics;  Laterality: Left;   COLONOSCOPY  12/18/2018   found 1 3mm polyp, going back in 5 years    COLONOSCOPY WITH PROPOFOL N/A 12/18/2018   Procedure: COLONOSCOPY WITH PROPOFOL;  Surgeon: Wyline Mood, MD;  Location: [facility] ENDOSCOPY;  Service: Gastroenterology;  Laterality: N/A;   IRRIGATION AND DEBRIDEMENT ABSCESS N/A 01/24/2015   Procedure: IRRIGATION AND DEBRIDEMENT ABSCESS/GROIN ABSCESS;  Surgeon: Ida Rogue, MD;  Location: [facility] ORS;  Service: General;  Laterality: N/A;   KNEE ARTHROSCOPY W/ MENISCAL REPAIR Right 2008   ROTATOR CUFF REPAIR Right 2015   TONSILLECTOMY  1998    No Known Allergies  Objective/Physical Exam Neurovascular status intact.  Fiberglass cast was left intact today.  Patient able to wiggle her toes.  It is not cutting into irritating the lower extremity.  Capillary refill immediate.  No paresthesia.  The calf is soft and supple  Radiographic Exam LT foot 01/24/2023:  Interval resection of the posterior heel spur and  calcifications along the Achilles tendon.  Skin staples noted along the posterior aspect of the heel/ankle  Assessment: 1. s/p retrocalcaneal exostectomy with repair of Achilles tendon left lower extremity. DOS: 01/16/2023   Plan of Care:  -Patient was evaluated. X-rays reviewed -Cast was left intact today - Continue strict nonweightbearing using the knee scooter -Note for work was provided.  Patient can work from home without restrictions.  She may return to work physical location half day shifts only strictly nonweightbearing elevate as needed -Return to clinic 3 weeks   Felecia Shelling, DPM [facility]  Dr. Felecia Shelling, DPM    [address] [place], [place] [address]                Office [phone]  Fax [phone]

## 2023-01-26 ENCOUNTER — Encounter: Payer: Self-pay | Admitting: Podiatry

## 2023-01-28 ENCOUNTER — Ambulatory Visit (INDEPENDENT_AMBULATORY_CARE_PROVIDER_SITE_OTHER): Payer: BC Managed Care – PPO | Admitting: Podiatry

## 2023-01-28 ENCOUNTER — Encounter: Payer: BC Managed Care – PPO | Admitting: Podiatry

## 2023-01-28 DIAGNOSIS — M7662 Achilles tendinitis, left leg: Secondary | ICD-10-CM

## 2023-01-28 NOTE — Progress Notes (Signed)
   No chief complaint on file.   Subjective:  Patient presents today status post retrocalcaneal exostectomy with repair of Achilles tendon left lower extremity.  DOS: 01/16/2023.  Patient states she sustained a fall and was concerned. No pain to the extremity and she denies any change in nature of the foot/leg. Presenting for further treatment and evaluation.   Past Medical History:  Diagnosis Date   Arthritis    Diabetes mellitus without complication (HCC)    type 2   Thyroid disease     Past Surgical History:  Procedure Laterality Date   APPENDECTOMY  1993   CALCANEAL OSTEOTOMY Left 07/17/2016   Procedure: CALCANEAL OSTEOTOMY;  Surgeon: Deeann Saint, MD;  Location: [facility] ORS;  Service: Orthopedics;  Laterality: Left;   COLONOSCOPY  12/18/2018   found 1 3mm polyp, going back in 5 years    COLONOSCOPY WITH PROPOFOL N/A 12/18/2018   Procedure: COLONOSCOPY WITH PROPOFOL;  Surgeon: Wyline Mood, MD;  Location: [facility] ENDOSCOPY;  Service: Gastroenterology;  Laterality: N/A;   IRRIGATION AND DEBRIDEMENT ABSCESS N/A 01/24/2015   Procedure: IRRIGATION AND DEBRIDEMENT ABSCESS/GROIN ABSCESS;  Surgeon: Ida Rogue, MD;  Location: [facility] ORS;  Service: General;  Laterality: N/A;   KNEE ARTHROSCOPY W/ MENISCAL REPAIR Right 2008   ROTATOR CUFF REPAIR Right 2015   TONSILLECTOMY  1998    No Known Allergies  Objective/Physical Exam Neurovascular status intact.  Incision and staples intact and healing nicely. No erythema or edema.   Radiographic Exam LT foot 01/24/2023:  Interval resection of the posterior heel spur and calcifications along the Achilles tendon.  Skin staples noted along the posterior aspect of the heel/ankle  Assessment: 1. s/p retrocalcaneal exostectomy with repair of Achilles tendon left lower extremity. DOS: 01/16/2023   Plan of Care:  -Patient was evaluated.  -Cast was bivalved and removed today. Incision nicely healing. No erythema or edema -Cast reapplied.   -Continue strict nonweightbearing using the knee scooter -Continue work from home without restrictions.  She may return to work physical location half day shifts only strictly nonweightbearing elevate as needed -Return to clinic next scheduled appt   Tanya Graves, DPM [facility]  Dr. Felecia Graves, DPM    [address] [place], [place] [address]                Office [phone]  Fax [phone]

## 2023-01-31 ENCOUNTER — Encounter: Payer: BC Managed Care – PPO | Admitting: Podiatry

## 2023-02-14 ENCOUNTER — Encounter: Payer: Self-pay | Admitting: Podiatry

## 2023-02-14 ENCOUNTER — Ambulatory Visit (INDEPENDENT_AMBULATORY_CARE_PROVIDER_SITE_OTHER): Payer: BC Managed Care – PPO | Admitting: Podiatry

## 2023-02-14 VITALS — BP 170/92 | HR 67

## 2023-02-14 DIAGNOSIS — M7662 Achilles tendinitis, left leg: Secondary | ICD-10-CM

## 2023-02-14 NOTE — Progress Notes (Signed)
   Chief Complaint  Patient presents with   Routine Post Op    "I'm ready to get this off.  It swells up if I work 4-5 hours.  I make sure I elevate it and the swelling goes away right away.  I don't have any pain at all I rarely take Ibuprofen."    Subjective:  Patient presents today status post retrocalcaneal exostectomy with repair of Achilles tendon left lower extremity.  DOS: 01/16/2023.  Patient well.  NWB surgical extremity.  No new complaints  Past Medical History:  Diagnosis Date   Arthritis    Diabetes mellitus without complication (HCC)    type 2   Thyroid disease     Past Surgical History:  Procedure Laterality Date   APPENDECTOMY  1993   CALCANEAL OSTEOTOMY Left 07/17/2016   Procedure: CALCANEAL OSTEOTOMY;  Surgeon: Deeann Saint, MD;  Location: [facility] ORS;  Service: Orthopedics;  Laterality: Left;   COLONOSCOPY  12/18/2018   found 1 3mm polyp, going back in 5 years    COLONOSCOPY WITH PROPOFOL N/A 12/18/2018   Procedure: COLONOSCOPY WITH PROPOFOL;  Surgeon: Wyline Mood, MD;  Location: [facility] ENDOSCOPY;  Service: Gastroenterology;  Laterality: N/A;   IRRIGATION AND DEBRIDEMENT ABSCESS N/A 01/24/2015   Procedure: IRRIGATION AND DEBRIDEMENT ABSCESS/GROIN ABSCESS;  Surgeon: Ida Rogue, MD;  Location: [facility] ORS;  Service: General;  Laterality: N/A;   KNEE ARTHROSCOPY W/ MENISCAL REPAIR Right 2008   ROTATOR CUFF REPAIR Right 2015   TONSILLECTOMY  1998    No Known Allergies  Objective/Physical Exam Neurovascular status intact.  Incision and staples intact and healing nicely. No erythema or edema.   Radiographic Exam LT foot 01/24/2023:  Interval resection of the posterior heel spur and calcifications along the Achilles tendon.  Skin staples noted along the posterior aspect of the heel/ankle  Assessment: 1. s/p retrocalcaneal exostectomy with repair of Achilles tendon left lower extremity. DOS: 01/16/2023   Plan of Care:  -Patient was evaluated.  Sutures and  staples removed -Cast was bivalved and removed today. Incision nicely healing. No erythema or edema - Immobilization cam boot with a small heel lift was applied.  Patient may begin PWB with the assistance of a walker -Recommend PROM -Return to clinic in 4 weeks.  At this time we will initiate physical therapy   Felecia Shelling, DPM [facility]  Dr. Felecia Shelling, DPM    [address] [place], [place] [address]                Office [phone]  Fax [phone]

## 2023-02-17 ENCOUNTER — Other Ambulatory Visit: Payer: Self-pay | Admitting: Internal Medicine

## 2023-02-17 ENCOUNTER — Other Ambulatory Visit: Payer: Self-pay

## 2023-02-25 NOTE — Telephone Encounter (Signed)
I contacted the patient via phone. I spoke with the patient advise the patient we were contacting her to scheduled an pap only. The patient requested to call back.

## 2023-03-04 ENCOUNTER — Other Ambulatory Visit: Payer: Self-pay | Admitting: Family Medicine

## 2023-03-04 NOTE — Patient Outreach (Signed)
Care Management   Visit Note   Name: Tanya Graves MRN: [id number] DOB: [birth year]  Subjective: Tanya Graves is a 57 y.o. year old female who is a primary care patient of Excell Seltzer, MD. The Care Management team was consulted for assistance.      Engaged with patient via telephone.  Assessment:  Outpatient Encounter Medications as of 02/17/2023  Medication Sig Note   albuterol (VENTOLIN HFA) 108 (90 Base) MCG/ACT inhaler Inhale 2 puffs into the lungs every 6 (six) hours as needed for wheezing or shortness of breath.    azithromycin (ZITHROMAX) 250 MG tablet 2 tab po x 1 day then 1 tab po daily    Continuous Blood Gluc Sensor (DEXCOM G6 SENSOR) MISC Change sensors every 10 days    Continuous Blood Gluc Transmit (DEXCOM G6 TRANSMITTER) MISC Change every 90 days    estradiol (ESTRACE) 0.1 MG/GM vaginal cream PLACE 1/4 APPLICATORFUL VAGINALLY AT BEDTIME    Glucagon (BAQSIMI ONE PACK) 3 MG/DOSE POWD Place 1 Device into the nose as directed.    glucose blood (CONTOUR NEXT TEST) test strip USE TO TEST BLOOD SUGAR 6 TIMES A DAY    insulin aspart (NOVOLOG) 100 UNIT/ML injection Max daily 110 units per pump    Insulin Disposable Pump (OMNIPOD 5 G6 PODS, GEN 5,) MISC USE 1 UNDER THE SKIN EVERY OTHER DAY TO CHECK BLOOD SUGAR    KLOR-CON M20 20 MEQ tablet TAKE 1 TABLET BY MOUTH DAILY AS NEEDED FOR PERIPHERAL SWELLING WHEN TAKING FUROSEMIDE    levothyroxine (SYNTHROID) 200 MCG tablet Take 1 tablet (200 mcg total) by mouth daily.    meloxicam (MOBIC) 15 MG tablet Take 1 tablet (15 mg total) by mouth daily.    oxyCODONE-acetaminophen (PERCOCET) 5-325 MG tablet Take 1 tablet by mouth every 4 (four) hours as needed for severe pain. (Patient not taking: Reported on 01/24/2023)    rosuvastatin (CRESTOR) 40 MG tablet Take 1 tablet (40 mg total) by mouth daily.    tirzepatide [facility]) 5 MG/0.5ML Pen Inject 5 mg into the skin once a week.    [DISCONTINUED] furosemide (LASIX) 20 MG tablet TAKE 1  TABLET BY MOUTH DAILY (Patient not taking: Reported on 02/17/2023) 02/17/2023: Reports currently holding   No facility-administered encounter medications on file as of 02/17/2023.

## 2023-03-04 NOTE — Telephone Encounter (Signed)
Message was sent to patient in August to set up annual wellness do not see where that has been done ok to refill?

## 2023-03-14 ENCOUNTER — Encounter: Payer: Self-pay | Admitting: Podiatry

## 2023-03-14 ENCOUNTER — Ambulatory Visit (INDEPENDENT_AMBULATORY_CARE_PROVIDER_SITE_OTHER): Payer: BC Managed Care – PPO | Admitting: Podiatry

## 2023-03-14 ENCOUNTER — Other Ambulatory Visit: Payer: Self-pay | Admitting: Family Medicine

## 2023-03-14 DIAGNOSIS — M7662 Achilles tendinitis, left leg: Secondary | ICD-10-CM

## 2023-03-14 NOTE — Progress Notes (Signed)
   No chief complaint on file.   Subjective:  Patient presents today status post retrocalcaneal exostectomy with repair of Achilles tendon left lower extremity.  DOS: 01/16/2023.  Patient continues to do well.  She has actually been transitioning out of the cam boot into tennis shoes at home.  No new complaints  Past Medical History:  Diagnosis Date   Arthritis    Diabetes mellitus without complication (HCC)    type 2   Thyroid disease     Past Surgical History:  Procedure Laterality Date   APPENDECTOMY  1993   CALCANEAL OSTEOTOMY Left 07/17/2016   Procedure: CALCANEAL OSTEOTOMY;  Surgeon: Deeann Saint, MD;  Location: [facility] ORS;  Service: Orthopedics;  Laterality: Left;   COLONOSCOPY  12/18/2018   found 1 3mm polyp, going back in 5 years    COLONOSCOPY WITH PROPOFOL N/A 12/18/2018   Procedure: COLONOSCOPY WITH PROPOFOL;  Surgeon: Wyline Mood, MD;  Location: [facility] ENDOSCOPY;  Service: Gastroenterology;  Laterality: N/A;   IRRIGATION AND DEBRIDEMENT ABSCESS N/A 01/24/2015   Procedure: IRRIGATION AND DEBRIDEMENT ABSCESS/GROIN ABSCESS;  Surgeon: Ida Rogue, MD;  Location: [facility] ORS;  Service: General;  Laterality: N/A;   KNEE ARTHROSCOPY W/ MENISCAL REPAIR Right 2008   ROTATOR CUFF REPAIR Right 2015   TONSILLECTOMY  1998    No Known Allergies  Objective/Physical Exam Neurovascular status intact.  Incision and staples intact and healing nicely. No erythema or edema.   Radiographic Exam LT foot 01/24/2023:  Interval resection of the posterior heel spur and calcifications along the Achilles tendon.  Incision nicely healed.  Muscle strength 5/5 all compartments.  Good plantarflexion strength with activation of the Achilles.  No tenderness with palpation.  Assessment: 1. s/p retrocalcaneal exostectomy with repair of Achilles tendon left lower extremity. DOS: 01/16/2023   Plan of Care:  -Patient was evaluated.   -Patient ultimately declined physical therapy which was recommended  today. -Patient may transition slowly out of the cam boot into good supportive tennis shoes and sneakers -Recommend daily stretching and strengthening exercises at home -Return to clinic 3 months  Felecia Shelling, DPM [facility]  Dr. Felecia Shelling, DPM    [address] [place], [place] [address]                Office [phone]  Fax [phone]

## 2023-03-17 ENCOUNTER — Telehealth: Payer: Self-pay

## 2023-03-17 NOTE — Patient Outreach (Signed)
Care Management   Outreach Note  03/17/2023 Name: Tanya Graves MRN: [id number] DOB: Nov 24, 1965  An unsuccessful telephone outreach was attempted today to contact the patient about Care Management needs.      Follow Up Plan:  A HIPAA compliant phone message was left for the patient providing contact information and requesting a return call.    [name]  [facility], [facility] Health RN Care Manager Direct Dial: [phone] Website: [email or web address]

## 2023-03-24 ENCOUNTER — Other Ambulatory Visit: Payer: Self-pay | Admitting: Internal Medicine

## 2023-03-26 ENCOUNTER — Other Ambulatory Visit: Payer: Self-pay

## 2023-03-26 ENCOUNTER — Encounter: Payer: Self-pay | Admitting: Internal Medicine

## 2023-03-26 MED ORDER — DEXCOM G6 SENSOR MISC: 6 refills | Status: DC

## 2023-04-01 ENCOUNTER — Other Ambulatory Visit: Payer: Self-pay

## 2023-04-03 ENCOUNTER — Telehealth: Payer: Self-pay | Admitting: *Deleted

## 2023-04-03 DIAGNOSIS — E1169 Type 2 diabetes mellitus with other specified complication: Secondary | ICD-10-CM

## 2023-04-03 DIAGNOSIS — E1065 Type 1 diabetes mellitus with hyperglycemia: Secondary | ICD-10-CM

## 2023-04-03 NOTE — Patient Outreach (Signed)
  Care Management   Visit Note   Name: [name] MRN: [id number] DOB: 05-08-1966  Subjective: Tanya Graves is a 57 y.o. year old female who is a primary care patient of Excell Seltzer, MD. The Care Management team was consulted for assistance.      Brief outreach with [name] regarding care management follow up. Unable to complete outreach at the time of the call. She will also need follow up with her Primary Care Provider. She plans to schedule a clinic visit later this week. Agreed to complete care management follow up after her visit with Dr. Ermalene Searing.   PLAN Anticipate outreach later this month.   [name]  [facility], [facility] RN Care Manager Direct Dial: [phone] Website: [email or web address]

## 2023-04-03 NOTE — Telephone Encounter (Signed)
-----   Message from Lovena Neighbours sent at 04/03/2023  1:43 PM EST ----- Regarding: Labs for Monday 11.11.24 Please put physical lab orders in future. Thank you, Denny Peon

## 2023-04-07 ENCOUNTER — Other Ambulatory Visit (INDEPENDENT_AMBULATORY_CARE_PROVIDER_SITE_OTHER): Payer: BC Managed Care – PPO

## 2023-04-07 DIAGNOSIS — E785 Hyperlipidemia, unspecified: Secondary | ICD-10-CM | POA: Diagnosis not present

## 2023-04-07 DIAGNOSIS — E1065 Type 1 diabetes mellitus with hyperglycemia: Secondary | ICD-10-CM | POA: Diagnosis not present

## 2023-04-07 DIAGNOSIS — E1169 Type 2 diabetes mellitus with other specified complication: Secondary | ICD-10-CM

## 2023-04-08 LAB — COMPREHENSIVE METABOLIC PANEL
ALT: 14 U/L (ref 0–35)
AST: 16 U/L (ref 0–37)
Albumin: 4.3 g/dL (ref 3.5–5.2)
Alkaline Phosphatase: 112 U/L (ref 39–117)
BUN: 23 mg/dL (ref 6–23)
CO2: 29 meq/L (ref 19–32)
Calcium: 9.8 mg/dL (ref 8.4–10.5)
Chloride: 105 meq/L (ref 96–112)
Creatinine, Ser: 0.91 mg/dL (ref 0.40–1.20)
GFR: 70.65 mL/min (ref >60.00)
Glucose, Bld: 151 mg/dL — ABNORMAL HIGH (ref 70–99)
Potassium: 4.7 meq/L (ref 3.5–5.1)
Sodium: 141 meq/L (ref 135–145)
Total Bilirubin: 0.3 mg/dL (ref 0.2–1.2)
Total Protein: 6.8 g/dL (ref 6.0–8.3)

## 2023-04-08 LAB — LIPID PANEL
Cholesterol: 133 mg/dL (ref 0–200)
HDL: 67.8 mg/dL (ref >39.00)
LDL Cholesterol: 52 mg/dL (ref 0–99)
NonHDL: 64.86
Total CHOL/HDL Ratio: 2
Triglycerides: 66 mg/dL (ref 0.0–149.0)
VLDL: 13.2 mg/dL (ref 0.0–40.0)

## 2023-04-08 LAB — HEMOGLOBIN A1C: Hgb A1c MFr Bld: 8.6 % — ABNORMAL HIGH (ref 4.6–6.5)

## 2023-04-08 NOTE — Progress Notes (Signed)
No critical labs need to be addressed urgently. We will discuss labs in detail at upcoming office visit.   

## 2023-04-11 ENCOUNTER — Other Ambulatory Visit: Payer: Self-pay | Admitting: Internal Medicine

## 2023-04-11 DIAGNOSIS — E039 Hypothyroidism, unspecified: Secondary | ICD-10-CM

## 2023-04-15 ENCOUNTER — Encounter: Payer: Self-pay | Admitting: Internal Medicine

## 2023-04-15 ENCOUNTER — Encounter: Payer: Self-pay | Admitting: Obstetrics and Gynecology

## 2023-04-15 ENCOUNTER — Encounter: Payer: Self-pay | Admitting: Family Medicine

## 2023-04-15 ENCOUNTER — Other Ambulatory Visit (HOSPITAL_COMMUNITY)
Admission: RE | Admit: 2023-04-15 | Discharge: 2023-04-15 | Disposition: A | Discharge status: Home / Self Care | Payer: BC Managed Care – PPO | Source: Ambulatory Visit | Attending: Obstetrics and Gynecology | Admitting: Obstetrics and Gynecology

## 2023-04-15 ENCOUNTER — Ambulatory Visit (INDEPENDENT_AMBULATORY_CARE_PROVIDER_SITE_OTHER): Payer: BC Managed Care – PPO | Admitting: Family Medicine

## 2023-04-15 ENCOUNTER — Ambulatory Visit (INDEPENDENT_AMBULATORY_CARE_PROVIDER_SITE_OTHER): Payer: BC Managed Care – PPO | Admitting: Obstetrics and Gynecology

## 2023-04-15 ENCOUNTER — Ambulatory Visit (INDEPENDENT_AMBULATORY_CARE_PROVIDER_SITE_OTHER): Payer: BC Managed Care – PPO | Admitting: Internal Medicine

## 2023-04-15 VITALS — BP 120/76 | HR 84 | Ht 68.0 in | Wt 275.0 lb

## 2023-04-15 VITALS — BP 140/80 | HR 86 | Temp 98.0°F | Ht 68.0 in | Wt 271.4 lb

## 2023-04-15 VITALS — BP 149/83 | HR 62 | Ht 68.0 in | Wt 274.7 lb

## 2023-04-15 DIAGNOSIS — E039 Hypothyroidism, unspecified: Secondary | ICD-10-CM

## 2023-04-15 DIAGNOSIS — Z720 Tobacco use: Secondary | ICD-10-CM | POA: Diagnosis not present

## 2023-04-15 DIAGNOSIS — Z124 Encounter for screening for malignant neoplasm of cervix: Secondary | ICD-10-CM | POA: Insufficient documentation

## 2023-04-15 DIAGNOSIS — Z0000 Encounter for general adult medical examination without abnormal findings: Secondary | ICD-10-CM | POA: Diagnosis not present

## 2023-04-15 DIAGNOSIS — G4733 Obstructive sleep apnea (adult) (pediatric): Secondary | ICD-10-CM

## 2023-04-15 DIAGNOSIS — E108 Type 1 diabetes mellitus with unspecified complications: Secondary | ICD-10-CM

## 2023-04-15 DIAGNOSIS — R062 Wheezing: Secondary | ICD-10-CM

## 2023-04-15 DIAGNOSIS — E1065 Type 1 diabetes mellitus with hyperglycemia: Secondary | ICD-10-CM

## 2023-04-15 DIAGNOSIS — E890 Postprocedural hypothyroidism: Secondary | ICD-10-CM

## 2023-04-15 DIAGNOSIS — E1169 Type 2 diabetes mellitus with other specified complication: Secondary | ICD-10-CM

## 2023-04-15 DIAGNOSIS — R87810 Cervical high risk human papillomavirus (HPV) DNA test positive: Secondary | ICD-10-CM

## 2023-04-15 DIAGNOSIS — N870 Mild cervical dysplasia: Secondary | ICD-10-CM

## 2023-04-15 DIAGNOSIS — R87610 Atypical squamous cells of undetermined significance on cytologic smear of cervix (ASC-US): Secondary | ICD-10-CM

## 2023-04-15 DIAGNOSIS — Z23 Encounter for immunization: Secondary | ICD-10-CM | POA: Diagnosis not present

## 2023-04-15 DIAGNOSIS — E7800 Pure hypercholesterolemia, unspecified: Secondary | ICD-10-CM

## 2023-04-15 DIAGNOSIS — E785 Hyperlipidemia, unspecified: Secondary | ICD-10-CM

## 2023-04-15 MED ORDER — ALBUTEROL SULFATE HFA 108 (90 BASE) MCG/ACT IN AERS: 2.0000 | INHALATION_SPRAY | Freq: Four times a day (QID) | RESPIRATORY_TRACT | 0 refills | Status: DC | PRN

## 2023-04-15 MED ORDER — OMNIPOD 5 G7 PODS (GEN 5) MISC: 1.0000 | 3 refills | Status: DC

## 2023-04-15 MED ORDER — TIRZEPATIDE 2.5 MG/0.5ML ~~LOC~~ SOAJ: 2.5000 mg | SUBCUTANEOUS | 0 refills | Status: DC

## 2023-04-15 MED ORDER — VARENICLINE TARTRATE 1 MG PO TABS: 1.0000 mg | ORAL_TABLET | Freq: Two times a day (BID) | ORAL | 1 refills | Status: DC

## 2023-04-15 MED ORDER — TIRZEPATIDE 5 MG/0.5ML ~~LOC~~ SOAJ
5.0000 mg | SUBCUTANEOUS | 3 refills | Status: DC
Start: 2023-04-15 — End: 2023-07-01

## 2023-04-15 MED ORDER — DEXCOM G7 SENSOR MISC: 1.0000 | 3 refills | Status: DC

## 2023-04-15 MED ORDER — VARENICLINE TARTRATE (STARTER) 0.5 MG X 11 & 1 MG X 42 PO TBPK: ORAL_TABLET | ORAL | 0 refills | Status: DC

## 2023-04-15 NOTE — Assessment & Plan Note (Signed)
 Followed by gynecology.

## 2023-04-15 NOTE — Progress Notes (Signed)
Patient ID: Tanya Graves, female    DOB: 03-24-1966, 57 y.o.   MRN: [id number]  This visit was conducted in person.  BP (!) 140/80 (BP Location: Right Arm, Patient Position: Sitting, Cuff Size: Large)   Pulse 86   Temp 98 F (36.7 C) (Temporal)   Ht 5\' 8"  (1.727 m)   Wt 271 lb 6 oz (123.1 kg)   LMP 06/28/2015 (Approximate)   SpO2 95%   BMI 41.26 kg/m    CC:  Chief Complaint  Patient presents with   Annual Exam    Subjective:   HPI: Tanya Graves is a 57 y.o. female presenting on 04/15/2023 for Annual Exam  The patient presents for complete physical and review of chronic health problems. She also has the following acute concerns today: none  Type 1 diabetes with history of DKA, hypothyroidism: Followed by Dr. Almon Hercules endocrinology. Reviewed last office visit October 31, 2022 Patient treated with OmniPod insulin pump.  Has appt with  [facility]  [facility] has refuse [facility] Lab Results  Component Value Date   HGBA1C 8.6 (H) 04/07/2023  Up-to-date with diabetes kidney evaluation, foot exam, microalbuminuria check  Hypothyroid, post ablative: Chronic, stable control on levothyroxine 175 mcg daily Lab Results  Component Value Date   TSH 5.29 08/08/2022   Obstructive sleep apnea:  no longer using CPAP given nasal congestion.  High cholesterol : LDL at goal on rosuvastatin 40 mg p.o. daily Lab Results  Component Value Date   CHOL 133 04/07/2023   HDL 67.80 04/07/2023   LDLCALC 52 04/07/2023   LDLDIRECT 88.0 03/25/2019   TRIG 66.0 04/07/2023   CHOLHDL 2 04/07/2023      Recently seeing podiatry Dr. Logan Bores for Achilles tendinitis, left Reviewed last office visit March 14, 2023  Doing home PT.  Wt Readings from Last 3 Encounters:  04/15/23 271 lb 6 oz (123.1 kg)  10/31/22 249 lb (112.9 kg)  09/13/22 248 lb 4 oz (112.6 kg)     She has not been taking  furosemide/ K   No peripheral edema. BP Readings from Last 3 Encounters:  04/15/23 (!) 140/80  02/14/23  (!) 170/92  11/15/22 (!) 145/82    Under stress.. currently seeing counselor. Talking to pastor at church.  Using ashwaganda.    04/15/2023    9:19 AM 09/13/2022    8:48 AM 08/27/2021    8:54 AM  Depression screen PHQ 2/9  Decreased Interest 1 0 0  Down, Depressed, Hopeless 1 0 0  PHQ - 2 Score 2 0 0  Altered sleeping 1  1  Tired, decreased energy 1  1  Change in appetite 2  0  Feeling bad or failure about yourself  0  0  Trouble concentrating 0  0  Moving slowly or fidgety/restless 0  0  Suicidal thoughts 0  0  PHQ-9 Score 6  2  Difficult doing work/chores Not difficult at all  Not difficult at all      Relevant past medical, surgical, family and social history reviewed and updated as indicated. Interim medical history since our last visit reviewed. Allergies and medications reviewed and updated. Outpatient Medications Prior to Visit  Medication Sig Dispense Refill   Continuous Glucose Sensor (DEXCOM G6 SENSOR) MISC Change sensors every 10 days 3 each 6   Continuous Glucose Transmitter (DEXCOM G6 TRANSMITTER) MISC USE AS DIRECTED TO CHECK BLOOD GLUCOSE. CHANGE EVERY 3 MONTHS 1 each 3   estradiol (ESTRACE) 0.1 MG/GM vaginal cream PLACE 1/4  APPLICATORFUL VAGINALLY AT BEDTIME 85 g 0   Glucagon (BAQSIMI ONE PACK) 3 MG/DOSE POWD Place 1 Device into the nose as directed. 1 each 3   glucose blood (CONTOUR NEXT TEST) test strip USE TO TEST BLOOD SUGAR 6 TIMES A DAY 100 strip 3   insulin aspart (NOVOLOG) 100 UNIT/ML injection Max daily 110 units per pump 110 mL 3   Insulin Disposable Pump (OMNIPOD 5 G6 PODS, GEN 5,) MISC USE 1 UNDER THE SKIN EVERY OTHER DAY TO CHECK BLOOD SUGAR 15 each 3   levothyroxine (SYNTHROID) 200 MCG tablet Take 1 tablet (200 mcg total) by mouth daily. 90 tablet 3   meloxicam (MOBIC) 15 MG tablet Take 1 tablet (15 mg total) by mouth daily. 60 tablet 1   rosuvastatin (CRESTOR) 40 MG tablet Take 1 tablet (40 mg total) by mouth daily. 90 tablet 3   albuterol  (VENTOLIN HFA) 108 (90 Base) MCG/ACT inhaler Inhale 2 puffs into the lungs every 6 (six) hours as needed for wheezing or shortness of breath. 8 g 0   furosemide (LASIX) 20 MG tablet TAKE 1 TABLET BY MOUTH DAILY 90 tablet 0   KLOR-CON M20 20 MEQ tablet TAKE 1 TABLET BY MOUTH DAILY AS NEEDED FOR PERIPHERAL SWELLING WHEN TAKING FUROSEMIDE 90 tablet 0   azithromycin (ZITHROMAX) 250 MG tablet 2 tab po x 1 day then 1 tab po daily 6 tablet 0   oxyCODONE-acetaminophen (PERCOCET) 5-325 MG tablet Take 1 tablet by mouth every 4 (four) hours as needed for severe pain. (Patient not taking: Reported on 01/24/2023) 30 tablet 0   tirzepatide (MOUNJARO) 5 MG/0.5ML Pen Inject 5 mg into the skin once a week. 6 mL 3   No facility-administered medications prior to visit.     Per HPI unless specifically indicated in ROS section below Review of Systems  Constitutional:  Negative for fatigue and fever.  HENT:  Negative for congestion.   Eyes:  Negative for pain.  Respiratory:  Negative for cough and shortness of breath.   Cardiovascular:  Negative for chest pain, palpitations and leg swelling.  Gastrointestinal:  Negative for abdominal pain.  Genitourinary:  Negative for dysuria and vaginal bleeding.  Musculoskeletal:  Negative for back pain.  Neurological:  Negative for syncope, light-headedness and headaches.  Psychiatric/Behavioral:  Negative for dysphoric mood.     Objective:  BP (!) 140/80 (BP Location: Right Arm, Patient Position: Sitting, Cuff Size: Large)   Pulse 86   Temp 98 F (36.7 C) (Temporal)   Ht 5\' 8"  (1.727 m)   Wt 271 lb 6 oz (123.1 kg)   LMP 06/28/2015 (Approximate)   SpO2 95%   BMI 41.26 kg/m   Wt Readings from Last 3 Encounters:  04/15/23 271 lb 6 oz (123.1 kg)  10/31/22 249 lb (112.9 kg)  09/13/22 248 lb 4 oz (112.6 kg)      Physical Exam Vitals and nursing note reviewed.  Constitutional:      General: She is not in acute distress.    Appearance: Normal appearance. She is  well-developed. She is not ill-appearing or toxic-appearing.  HENT:     Head: Normocephalic.     Right Ear: Hearing, tympanic membrane, ear canal and external ear normal.     Left Ear: Hearing, tympanic membrane, ear canal and external ear normal.     Nose: Nose normal.  Eyes:     General: Lids are normal. Lids are everted, no foreign bodies appreciated.     Conjunctiva/sclera: Conjunctivae normal.  Pupils: Pupils are equal, round, and reactive to light.  Neck:     Thyroid: No thyroid mass or thyromegaly.     Vascular: No carotid bruit.     Trachea: Trachea normal.  Cardiovascular:     Rate and Rhythm: Normal rate and regular rhythm.     Heart sounds: Normal heart sounds, S1 normal and S2 normal. No murmur heard.    No gallop.  Pulmonary:     Effort: Pulmonary effort is normal. No respiratory distress.     Breath sounds: Normal breath sounds. No wheezing, rhonchi or rales.  Abdominal:     General: Bowel sounds are normal. There is no distension or abdominal bruit.     Palpations: Abdomen is soft. There is no fluid wave or mass.     Tenderness: There is no abdominal tenderness. There is no guarding or rebound.     Hernia: No hernia is present.  Musculoskeletal:     Cervical back: Normal range of motion and neck supple.  Lymphadenopathy:     Cervical: No cervical adenopathy.  Skin:    General: Skin is warm and dry.     Findings: No rash.  Neurological:     Mental Status: She is alert.     Cranial Nerves: No cranial nerve deficit.     Sensory: No sensory deficit.  Psychiatric:        Mood and Affect: Mood is not anxious or depressed.        Speech: Speech normal.        Behavior: Behavior normal. Behavior is cooperative.        Judgment: Judgment normal.       Results for orders placed or performed in visit on 04/07/23  Comprehensive metabolic panel  Result Value Ref Range   Sodium 141 135 - 145 mEq/L   Potassium 4.7 3.5 - 5.1 mEq/L   Chloride 105 96 - 112 mEq/L    CO2 29 19 - 32 mEq/L   Glucose, Bld 151 (H) 70 - 99 mg/dL   BUN 23 6 - 23 mg/dL   Creatinine, Ser 8.84 0.40 - 1.20 mg/dL   Total Bilirubin 0.3 0.2 - 1.2 mg/dL   Alkaline Phosphatase 112 39 - 117 U/L   AST 16 0 - 37 U/L   ALT 14 0 - 35 U/L   Total Protein 6.8 6.0 - 8.3 g/dL   Albumin 4.3 3.5 - 5.2 g/dL   GFR 16.60 >63.01 mL/min   Calcium 9.8 8.4 - 10.5 mg/dL  Hemoglobin S0F  Result Value Ref Range   Hgb A1c MFr Bld 8.6 (H) 4.6 - 6.5 %  Lipid panel  Result Value Ref Range   Cholesterol 133 0 - 200 mg/dL   Triglycerides 09.3 0.0 - 149.0 mg/dL   HDL 23.55 >73.22 mg/dL   VLDL 02.5 0.0 - 42.7 mg/dL   LDL Cholesterol 52 0 - 99 mg/dL   Total CHOL/HDL Ratio 2    NonHDL 64.86     Assessment and Plan The patient's preventative maintenance and recommended screening tests for an annual wellness exam were reviewed in full today. Brought up to date unless services declined.  Counselled on the importance of diet, exercise, and its role in overall health and mortality. The patient's FH and SH was reviewed, including their home life, tobacco status, and drug and alcohol status.   Vaccines: Due for flu shot, up-to-date with Shingrix and tetanus. Pap/DVE: ASCUS, positive high risk HPV Oct 03, 2021.Marland Kitchen  Evaluated per gynecology  with colposcopy 2023, plan repeat yearly Mammo: February 05, 2022, repeat yearly.. scheduled Bone Density: Colon: December 18, 2018, repeat in 5 years Smoking Status: Current every day smoker ETOH/ drug use:  Hep C: Done  HIV screen: Done Recommended yearly eye exam   Routine general medical examination at a health care facility  Type 1 diabetes mellitus with complications (HCC)  Hypothyroidism, unspecified type Assessment & Plan: Stable, chronic.  Continue current medication.   Levo 175 mcg daily   Tobacco use  OSA (obstructive sleep apnea) Assessment & Plan:  No longer using CPAP given nasal congestion.   Hypercholesteremia Assessment & Plan: Stable,  chronic.  Continue current medication. LDL at goal less than 100 Crestor 40 mg p.o. daily   ASCUS with positive high risk HPV cervical Assessment & Plan: Followed by gynecology.   Need for influenza vaccination -     Flu vaccine trivalent PF, 6mos and older(Flulaval,Afluria,Fluarix,Fluzone)  Wheezing -     Albuterol Sulfate HFA; Inhale 2 puffs into the lungs every 6 (six) hours as needed for wheezing or shortness of breath.  Dispense: 8 g; Refill: 0  Other orders -     Varenicline Tartrate; Take 1 tablet (1 mg total) by mouth 2 (two) times daily.  Dispense: 90 tablet; Refill: 1 -     Varenicline Tartrate (Starter); Take one 0.5 mg tab daily for 3 days,then inc. to one 0.5 mg tab BID x 4 days, then inc. to one 1 mg tab BID.  Dispense: 53 each; Refill: 0     Return in about 1 year (around 04/14/2024) for annual physical with fasting labs prior.   Kerby Nora, MD

## 2023-04-15 NOTE — Patient Instructions (Addendum)
Followed BP at home... all if consistently elevated greater than 140/90.  Get back to regular exercise as tolerated. Quit smoking.

## 2023-04-15 NOTE — Progress Notes (Signed)
HPI:      [name] is a 57 y.o. G1P0010 who LMP was Patient's last menstrual period was 06/28/2015 (approximate).  Subjective:   She presents today for first follow-up after LEEP.  She is a little behind in her appointment because she tore her Achilles and had shoulder surgery as well as going into a diabetic coma and having to recover from all of these things.  This is her first Pap after LEEP.  Her LEEP showed CIN-1 one. During her recovery from the above, she stopped using her vaginal estrogen cream but plans to restart.    Hx: The following portions of the patient's history were reviewed and updated as appropriate:             She  has a past medical history of Arthritis, Diabetes mellitus without complication (HCC), and Thyroid disease. She does not have any pertinent problems on file. She  has a past surgical history that includes Rotator cuff repair (Right, 2015); Knee arthroscopy w/ meniscal repair (Right, 2008); Irrigation and debridement abscess (N/A, 01/24/2015); Tonsillectomy (1998); Appendectomy (1993); Calcaneal osteotomy (Left, 07/17/2016); Colonoscopy with propofol (N/A, 12/18/2018); and Colonoscopy (12/18/2018). Her family history includes Alcohol abuse in her maternal grandmother; Arthritis in her mother; Breast cancer in her maternal great-grandmother; Cancer in her paternal grandfather; Diabetes in her brother, father, mother, and paternal grandmother; Heart failure in her father, maternal grandmother, and paternal grandfather; Hypertension in her brother; Kidney failure in her paternal grandmother; Transient ischemic attack in her mother. She  reports that she has been smoking cigarettes. She started smoking about 11 months ago. She has a 7.6 pack-year smoking history. She has never used smokeless tobacco. She reports that she does not currently use alcohol. She reports that she does not use drugs. She has a current medication list which includes the following  prescription(s): albuterol, dexcom g6 sensor, dexcom g6 transmitter, baqsimi one pack, contour next test, insulin aspart, omnipod 5 dexg7g6 pods gen 5, levothyroxine, meloxicam, rosuvastatin, varenicline, varenicline tartrate (starter), and estradiol. She has No Known Allergies.       Review of Systems:  Review of Systems  Constitutional: Denied constitutional symptoms, night sweats, recent illness, fatigue, fever, insomnia and weight loss.  Eyes: Denied eye symptoms, eye pain, photophobia, vision change and visual disturbance.  Ears/Nose/Throat/Neck: Denied ear, nose, throat or neck symptoms, hearing loss, nasal discharge, sinus congestion and sore throat.  Cardiovascular: Denied cardiovascular symptoms, arrhythmia, chest pain/pressure, edema, exercise intolerance, orthopnea and palpitations.  Respiratory: Denied pulmonary symptoms, asthma, pleuritic pain, productive sputum, cough, dyspnea and wheezing.  Gastrointestinal: Denied, gastro-esophageal reflux, melena, nausea and vomiting.  Genitourinary: Denied genitourinary symptoms including symptomatic vaginal discharge, pelvic relaxation issues, and urinary complaints.  Musculoskeletal: Denied musculoskeletal symptoms, stiffness, swelling, muscle weakness and myalgia.  Dermatologic: Denied dermatology symptoms, rash and scar.  Neurologic: Denied neurology symptoms, dizziness, headache, neck pain and syncope.  Psychiatric: Denied psychiatric symptoms, anxiety and depression.  Endocrine: Denied endocrine symptoms including hot flashes and night sweats.   Meds:   Current Outpatient Medications on File Prior to Visit  Medication Sig Dispense Refill   albuterol (VENTOLIN HFA) 108 (90 Base) MCG/ACT inhaler Inhale 2 puffs into the lungs every 6 (six) hours as needed for wheezing or shortness of breath. 8 g 0   Continuous Glucose Sensor (DEXCOM G6 SENSOR) MISC Change sensors every 10 days 3 each 6   Continuous Glucose Transmitter (DEXCOM G6  TRANSMITTER) MISC USE AS DIRECTED TO CHECK BLOOD GLUCOSE. CHANGE EVERY 3  MONTHS 1 each 3   Glucagon (BAQSIMI ONE PACK) 3 MG/DOSE POWD Place 1 Device into the nose as directed. 1 each 3   glucose blood (CONTOUR NEXT TEST) test strip USE TO TEST BLOOD SUGAR 6 TIMES A DAY 100 strip 3   insulin aspart (NOVOLOG) 100 UNIT/ML injection Max daily 110 units per pump 110 mL 3   Insulin Disposable Pump (OMNIPOD 5 G6 PODS, GEN 5,) MISC USE 1 UNDER THE SKIN EVERY OTHER DAY TO CHECK BLOOD SUGAR 15 each 3   levothyroxine (SYNTHROID) 200 MCG tablet Take 1 tablet (200 mcg total) by mouth daily. 90 tablet 3   meloxicam (MOBIC) 15 MG tablet Take 1 tablet (15 mg total) by mouth daily. 60 tablet 1   rosuvastatin (CRESTOR) 40 MG tablet Take 1 tablet (40 mg total) by mouth daily. 90 tablet 3   varenicline (CHANTIX CONTINUING MONTH PAK) 1 MG tablet Take 1 tablet (1 mg total) by mouth 2 (two) times daily. 90 tablet 1   Varenicline Tartrate, Starter, (CHANTIX STARTING MONTH PAK) 0.5 MG X 11 & 1 MG X 42 TBPK Take one 0.5 mg tab daily for 3 days,then inc. to one 0.5 mg tab BID x 4 days, then inc. to one 1 mg tab BID. 53 each 0   estradiol (ESTRACE) 0.1 MG/GM vaginal cream PLACE 1/4 APPLICATORFUL VAGINALLY AT BEDTIME (Patient not taking: Reported on 04/15/2023) 85 g 0   No current facility-administered medications on file prior to visit.      Objective:     Vitals:   04/15/23 1026  BP: (!) 149/83  Pulse: 62   Filed Weights   04/15/23 1026  Weight: 274 lb 11.2 oz (124.6 kg)              Physical examination   Pelvic:   Vulva: Normal appearance.  No lesions.  Vagina: No lesions or abnormalities noted.  Support: Normal pelvic support.  Urethra No masses tenderness or scarring.  Meatus Normal size without lesions or prolapse.  Cervix: Normal appearance.  No lesions.  Anus: Normal exam.  No lesions.  Perineum: Normal exam.  No lesions.        Bimanual   Uterus: Normal size.  Non-tender.  Mobile.  AV.   Adnexae: No masses.  Non-tender to palpation.  Cul-de-sac: Negative for abnormality.             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 Cervical cancer screening   2. CIN I (cervical intraepithelial neoplasia I)     First Pap after LEEP   Plan:            1.  Pap performed   We will contact her with follow-up as necessary. Orders No orders of the defined types were placed in this encounter.   No orders of the defined types were placed in this encounter.     F/U  Return for We will contact her with any abnormal test results.  Elonda Husky, M.D. 04/15/2023 10:54  AM

## 2023-04-15 NOTE — Progress Notes (Signed)
Patient presents today for a repeat pap smear following a LEEP. She states she has stopped using vaginal estrogen but is doing well.

## 2023-04-15 NOTE — Patient Instructions (Addendum)
Start Mounjaro 2.5 mg weekly for 4 weeks, than increase to 5 mg weekly   - HOW TO TREAT LOW BLOOD SUGARS (Blood sugar LESS THAN 70 MG/DL) Please follow the RULE OF 15 for the treatment of hypoglycemia treatment (when your (blood sugars are less than 70 mg/dL)   STEP 1: Take 15 grams of carbohydrates when your blood sugar is low, which includes:  3-4 GLUCOSE TABS  OR 3-4 OZ OF JUICE OR REGULAR SODA OR ONE TUBE OF GLUCOSE GEL    STEP 2: RECHECK blood sugar in 15 MINUTES STEP 3: If your blood sugar is still low at the 15 minute recheck --> then, go back to STEP 1 and treat AGAIN with another 15 grams of carbohydrates.

## 2023-04-15 NOTE — Progress Notes (Unsigned)
Name: SHAWNDELL Graves  Age/ Sex: 57 y.o., female   MRN/ DOB: [id number], 02/25/1966     PCP: Excell Seltzer, MD   Reason for Endocrinology Evaluation: Type 2 Diabetes Mellitus  Initial Endocrine Consultative Visit: 07/08/2018    PATIENT IDENTIFIER: Tanya Graves is a 57 y.o. female with a past medical history of T2DM, HTN, Dyslipidemia and hypothyroidism . The patient has followed with Endocrinology clinic since 07/08/2018 for consultative assistance with management of her diabetes.  DIABETIC HISTORY:  Tanya Graves was diagnosed with T2DM ~ 2010.  She does not recall intolerance to other oral glycemic agents.  On her initial visit here she was on MDI regimen of insulin and metformin. Her hemoglobin A1c has ranged from 11.4%  in 2016, peaking at 12.5% in 2019  GLP-1 and SGLT-2 inhibitors have been cost prohibitive in the past    She has been off metformin since October 2022  DKA 12/2021  Her diagnosis was changed to Type 1 DM with an elevated GAD-65 > 250  ( <5IU/mL and islet Ab 80 ( reference <1.25)  I prescribed mounjaro before her LADA Dx but this was discontinued after hospitalization for DKA in 04/2022 and we opted to start Ozempic 07/2022  Intolerant to Ozempic-GI side effects  Mounjaro was not covered due to being T1 DM   Thyroid Disease   She is S/P RAI ablation years ago > 20 yrs ago and has been on levothyroxine . She is on Levothyroxine 175 mcg daily   SUBJECTIVE:   During the last visit (10/31/2022): A1c 6.6% .     Today (04/15/2023): Tanya Graves for a follow-up on diabetes management.  She checks her glucose multiple times a day through CGM   She has been following up with [facility], s/p retrocalcaneal exectomy with repair of the Achilles tendon of the lower extremity 12/2022  She is recovering well but continues with neuropathic symptoms  Has been stress eating   This patient with type 1 diabetes is treated with Omnipod (insulin pump). During the  visit the pump basal and bolus doses were reviewed including carb/insulin rations and supplemental doses. The clinical list was updated. The glucose meter download was reviewed in detail to determine if the current pump settings are providing the best glycemic control without excessive hypoglycemia.  Pump and meter download:     Pump   Omnipod Settings   Insulin type   Novolog   Basal rate       0000 0.80 u/h               I:C ratio       0000 1:1    12 g TID , 6 g with snacks              Sensitivity       0000  35      Goal       0000  120           PUMP STATISTICS: Average BG: 198 Average Daily Carbs (g): 42.5 Average Total Daily Insulin: 88.8 Average Daily Basal: 46.1 (52 %) Average Daily Bolus: 42.7 (48 %)     CONTINUOUS GLUCOSE MONITORING RECORD INTERPRETATION    Dates of Recording: 11/6-11/19/2024  Sensor description:dexcom  Results statistics:   CGM use % of time 81.3  Average and SD 198/92  Time in range 53 %  % Time Above 180 18  % Time above 250 27  % Time  Below target 2   Glycemic patterns summary: BGs are overnight and increased throughout the day  Hyperglycemic episodes  postprandial   Hypoglycemic episodes occurred rarely without a pattern  Overnight periods: optimal      HOME DIABETES REGIMEN:  Novolog  Mounjaro 5 mg weekly  Rosuvastatin 40 mg daily      DIABETIC COMPLICATIONS: Microvascular complications:  Neuropathy , worsening DR Denies: CKD  Last eye exam: Completed  07/17/2020   Macrovascular complications:    Denies: CAD, PVD, CVA    HISTORY:  Past Medical History:  Past Medical History:  Diagnosis Date   Arthritis    Diabetes mellitus without complication (HCC)    type 2   Thyroid disease    Past Surgical History:  Past Surgical History:  Procedure Laterality Date   APPENDECTOMY  1993   CALCANEAL OSTEOTOMY Left 07/17/2016   Procedure: CALCANEAL OSTEOTOMY;  Surgeon: Deeann Saint, MD;  Location:  [facility] ORS;  Service: Orthopedics;  Laterality: Left;   COLONOSCOPY  12/18/2018   found 1 3mm polyp, going back in 5 years    COLONOSCOPY WITH PROPOFOL N/A 12/18/2018   Procedure: COLONOSCOPY WITH PROPOFOL;  Surgeon: Wyline Mood, MD;  Location: [facility] ENDOSCOPY;  Service: Gastroenterology;  Laterality: N/A;   IRRIGATION AND DEBRIDEMENT ABSCESS N/A 01/24/2015   Procedure: IRRIGATION AND DEBRIDEMENT ABSCESS/GROIN ABSCESS;  Surgeon: Ida Rogue, MD;  Location: [facility] ORS;  Service: General;  Laterality: N/A;   KNEE ARTHROSCOPY W/ MENISCAL REPAIR Right 2008   ROTATOR CUFF REPAIR Right 2015   TONSILLECTOMY  1998   Social History:  reports that she has been smoking cigarettes. She started smoking about 11 months ago. She has a 7.6 pack-year smoking history. She has never used smokeless tobacco. She reports that she does not currently use alcohol. She reports that she does not use drugs. Family History:  Family History  Problem Relation Age of Onset   Diabetes Father    Heart failure Father    Diabetes Brother    Arthritis Mother    Diabetes Mother    Transient ischemic attack Mother    Heart failure Maternal Grandmother    Alcohol abuse Maternal Grandmother    Diabetes Paternal Grandmother    Kidney failure Paternal Grandmother    Heart failure Paternal Grandfather    Cancer Paternal Grandfather        unknown type   Hypertension Brother    Breast cancer Maternal Great-grandmother     OBJECTIVE:   Vital Signs:BP 120/76 (BP Location: Left Arm, Patient Position: Sitting, Cuff Size: Large)   Pulse 84   Ht 5\' 8"  (1.727 m)   Wt 275 lb (124.7 kg)   LMP 06/28/2015 (Approximate)   SpO2 97%   BMI 41.81 kg/m   Wt Readings from Last 3 Encounters:  04/15/23 275 lb (124.7 kg)  04/15/23 274 lb 11.2 oz (124.6 kg)  04/15/23 271 lb 6 oz (123.1 kg)     Exam: General: Pt appears well and is in NAD  Lungs: Clear with good BS bilat   Heart: RRR   Neuro: MS is good with appropriate  affect, pt is alert and Ox3   DM foot exam: 03/14/2023 per podiatry The skin of the feet is intact without sores or ulcerations. The pedal pulses are 2+ on right and 2+ on left. The sensation is intact to a screening 5.07, 10 gram monofilament bilaterally     DATA REVIEWED:  Lab Results  Component Value Date   HGBA1C 8.6 (H) 04/07/2023  HGBA1C 7.6 (A) 10/31/2022   HGBA1C 7.5 (A) 08/08/2022     Latest Reference Range & Units 04/07/23 16:15  Sodium 135 - 145 mEq/L 141  Potassium 3.5 - 5.1 mEq/L 4.7  Chloride 96 - 112 mEq/L 105  CO2 19 - 32 mEq/L 29  Glucose 70 - 99 mg/dL 629 (H)  BUN 6 - 23 mg/dL 23  Creatinine 5.28 - 4.13 mg/dL 2.44  Calcium 8.4 - 01.0 mg/dL 9.8  Alkaline Phosphatase 39 - 117 U/L 112  Albumin 3.5 - 5.2 g/dL 4.3  AST 0 - 37 U/L 16  ALT 0 - 35 U/L 14  Total Protein 6.0 - 8.3 g/dL 6.8  Total Bilirubin 0.2 - 1.2 mg/dL 0.3  GFR >27.25 mL/min 70.65    Latest Reference Range & Units 04/07/23 16:15  Total CHOL/HDL Ratio  2  Cholesterol 0 - 200 mg/dL 366  HDL Cholesterol >44.03 mg/dL 47.42  LDL (calc) 0 - 99 mg/dL 52  NonHDL  59.56  Triglycerides 0.0 - 149.0 mg/dL 38.7  VLDL 0.0 - 56.4 mg/dL 33.2       ISLET CELL ANTIBODY SCREEN NEGATIVE POSITIVE   Glutamic Acid Decarb Ab <5 IU/mL >250 High     ASSESSMENT / PLAN / RECOMMENDATIONS:  1)Type 1 Diabetes Mellitus (LADA) , Poorly controlled, With Neuropathic and retinopathic complications - Most recent A1c of 8.6%. Goal A1c < 7.0 %.   -A1c has trended up from 7.6% to 8.6% -She is tolerating Mounjaro, she is comfortable with the current dose, will not change at this time as she is continuing to lose weight -SGLT2 inhibitors are to be avoided due to risk of DKA -Intolerant to metformin    MEDICATIONS: Mounjaro 5 mg weekly  NovoLog   Pump   Omnipod Settings   Insulin type   Novolog   Basal rate       0000 0.80 u/h               I:C ratio       0000 1:1    8-12 g TID                Sensitivity       0000  30      Goal       0000  120        EDUCATION / INSTRUCTIONS: BG monitoring instructions: Patient is instructed to check her blood sugars 4 times a day, before meals and bedtime. Call [facility] if: BG persistently < 70  I reviewed the Rule of 15 for the treatment of hypoglycemia in detail with the patient. Literature supplied.  2) Post ablative hypothyroidism:  Patient on Levothyroxine 200 mcg daily   3) Dyslipidemia :    -Historically she has had an optimal lipid panel -Patient was on 4 tabs of atorvastatin , which I switched to rosuvastatin 40 mg in March 2024    rosuvastatin 40 mg daily   F/U in 4 months     Signed electronically by: Lyndle Herrlich, MD  [facility] Endocrinology  [facility] [address] [place] [place], [place] [address] Phone: [phone] FAX: [phone]   CC: Excell Seltzer, MD [address] [place] [address] Phone: [phone]  Fax: [phone]  Return to Endocrinology clinic as below: No future appointments.

## 2023-04-15 NOTE — Assessment & Plan Note (Signed)
Stable, chronic.  Continue current medication. LDL at goal less than 100 Crestor 40 mg p.o. daily

## 2023-04-15 NOTE — Assessment & Plan Note (Addendum)
No longer using CPAP given nasal congestion.

## 2023-04-15 NOTE — Assessment & Plan Note (Signed)
Stable, chronic.  Continue current medication.   Levo 175 mcg daily

## 2023-04-16 ENCOUNTER — Encounter: Payer: Self-pay | Admitting: Internal Medicine

## 2023-04-16 LAB — TSH: TSH: 0.45 u[IU]/mL (ref 0.35–5.50)

## 2023-04-16 LAB — T4, FREE: Free T4: 1.1 ng/dL (ref 0.60–1.60)

## 2023-04-16 MED ORDER — LEVOTHYROXINE SODIUM 200 MCG PO TABS: 200.0000 ug | ORAL_TABLET | Freq: Every day | ORAL | 3 refills | Status: DC

## 2023-04-18 LAB — CYTOLOGY - PAP
Adequacy: ABSENT
Comment: NEGATIVE
Diagnosis: NEGATIVE
High risk HPV: NEGATIVE

## 2023-04-19 ENCOUNTER — Other Ambulatory Visit: Payer: Self-pay | Admitting: Internal Medicine

## 2023-05-04 ENCOUNTER — Other Ambulatory Visit: Payer: Self-pay | Admitting: Family Medicine

## 2023-05-04 DIAGNOSIS — R062 Wheezing: Secondary | ICD-10-CM

## 2023-06-02 ENCOUNTER — Other Ambulatory Visit: Payer: Self-pay | Admitting: Family Medicine

## 2023-06-03 ENCOUNTER — Telehealth: Payer: BC Managed Care – PPO | Admitting: Physician Assistant

## 2023-06-03 DIAGNOSIS — R3989 Other symptoms and signs involving the genitourinary system: Secondary | ICD-10-CM | POA: Diagnosis not present

## 2023-06-03 MED ORDER — CEPHALEXIN 500 MG PO CAPS: 500.0000 mg | ORAL_CAPSULE | Freq: Two times a day (BID) | ORAL | 0 refills | Status: AC

## 2023-06-03 NOTE — Progress Notes (Signed)
 I have spent 5 minutes in review of e-visit questionnaire, review and updating patient chart, medical decision making and response to patient.   Piedad Climes, PA-C

## 2023-06-03 NOTE — Progress Notes (Signed)

## 2023-06-30 ENCOUNTER — Other Ambulatory Visit: Payer: Self-pay

## 2023-06-30 NOTE — Patient Instructions (Signed)
 Thank you for allowing the Care Management team to participate in your care.  It was great speaking with you today!

## 2023-06-30 NOTE — Patient Outreach (Unsigned)
Care Management   Visit Note  06/30/2023 Name: Tanya Graves MRN: [id number] DOB: [birth year]  Subjective: Tanya Graves is a 58 y.o. year old female who is a primary care patient of Excell Seltzer, MD. The Care Management team was consulted for assistance.      Engaged with Ms. Mcglown via telephone  Assessment:  Review of patient past medical history, allergies, medications, health status, including review of consultants reports, laboratory and other test data, was performed as part of  evaluation and provision of care management services.   Outpatient Encounter Medications as of 06/30/2023  Medication Sig Note   albuterol (VENTOLIN HFA) 108 (90 Base) MCG/ACT inhaler INHALE 2 PUFFS BY MOUTH EVERY 6 HOURS AS NEEDED FOR WHEEZING OR SHORTNESS OF BREATH 06/30/2023: Needs new order    Continuous Glucose Sensor (DEXCOM G7 SENSOR) MISC 1 Device by Does not apply route as directed.    estradiol (ESTRACE) 0.1 MG/GM vaginal cream PLACE 1/4 APPLICATORFUL VAGINALLY AT BEDTIME (Patient not taking: Reported on 04/15/2023)    Glucagon (BAQSIMI ONE PACK) 3 MG/DOSE POWD Place 1 Device into the nose as directed.    glucose blood (CONTOUR NEXT TEST) test strip USE TO TEST BLOOD SUGAR 6 TIMES A DAY    insulin aspart (NOVOLOG) 100 UNIT/ML injection INJECT A MAXIMUM OF 60 UNITS UNDER THE SKIN DAILY    Insulin Disposable Pump (OMNIPOD 5 G7 PODS, GEN 5,) MISC 1 Device by Does not apply route every other day.    levothyroxine (SYNTHROID) 200 MCG tablet Take 1 tablet (200 mcg total) by mouth daily.    meloxicam (MOBIC) 15 MG tablet Take 1 tablet (15 mg total) by mouth daily.    rosuvastatin (CRESTOR) 40 MG tablet Take 1 tablet (40 mg total) by mouth daily.    tirzepatide [facility]) 2.5 MG/0.5ML Pen Inject 2.5 mg into the skin once a week. 06/30/2023: Currently taking 5 mg   tirzepatide (MOUNJARO) 5 MG/0.5ML Pen Inject 5 mg into the skin once a week.    varenicline (CHANTIX CONTINUING MONTH PAK) 1 MG tablet Take 1  tablet (1 mg total) by mouth 2 (two) times daily.    Varenicline Tartrate, Starter, (CHANTIX STARTING MONTH PAK) 0.5 MG X 11 & 1 MG X 42 TBPK Take one 0.5 mg tab daily for 3 days,then inc. to one 0.5 mg tab BID x 4 days, then inc. to one 1 mg tab BID.    No facility-administered encounter medications on file as of 06/30/2023.     Interventions:  Goals Addressed             This Visit's Progress    Monitor, Self-Manage And Reduce Symptoms of Diabetes       Current Barriers:  Care Management support and education needs related to Diabetes  Planned Interventions: Diabetes Reviewed provider's plan for diabetes management.  Reviewed medications. Reports taking medications as prescribed. Denies current concerns related to medication management or prescription cost.  Confirmed being approved for mounjaro and restarted 5 mg dose. She is interested in increasing her dose to 7.5 mg/weekly. Will relay message to Dr.  Lonzo Cloud. Expressed concerns regarding frequent device errors with her Dexcom G7 and Omnipod. Reports previously wearing the Omnipod on her abdomen but being instructed that all of the devices should be worn on her arm to decrease incidents of misreads. Reports this has become very uncomfortable and notes frequently hitting/bumping her Omnipod with arm movements. She is requesting additional advice from the Endocrinology team regarding options for  placement. Currently prefers to place all devices on her abdomen.  Reports recent fluctuations with blood glucose readings mainly due to diet choices. Reports lowest reading was 45 mg/dl. Reports high reading in the 300's due to holding insulin.  Reviewed information regarding hypoglycemia and hyperglycemia along with appropriate interventions Reviewed nutritional intake and importance of complying with a diabetic diet. Reports significant improvements with meals. Notes consumption of carbs/added sugars usually occurs with snacks. Thorough  discussion low carb snack options.  Advised to increase consumption of fruits, vegetables, and lean proteins. Advised to continue monitoring intake of carbohydrates and avoiding foods and beverages with added sugar when possible. Encouraged to notify the team if additional referral for nutrition/dietician is needed. Discussed importance of completing recommended DM preventive care. She is due for an Eye Exam. She will contact [facility] to schedule. Completing foot care with Podiatrist/Dr.  Discussed importance of completing ordered labs as prescribed.  Will message the Endocrinology team regarding mounjaro and Dexcom/Omnipod devices.    Lab Results  Component Value Date   HGBA1C 8.6 (H) 04/07/2023    Symptom Management: Attend all scheduled provider appointments Take medications as prescribed Monitor blood glucose and respond to Dexcom alarms appropriately Check feet daily for cuts, sores or redness Wash and dry feet carefully every day Wear comfortable, cotton socks Wear comfortable, well-fitting shoes Engage in low impact activities/exercises as tolerated Read food labels for fat, fiber, carbohydrates and portion size Decrease consumption of foods/beverages with high carbohydrate content and added sugars Contact [facility] to schedule exam Call provider office for new concerns or questions         Monitor, Self-Manage And Reduce Symptoms of Hyperlipidemia       Current Barriers:  Care Management support and education needs related to Hyperlipidemia.  Planned Interventions: Reviewed current treatment plan related to Hyperlipidemia Reviewed medications and indications for use. Reports taking medications as prescribed. Denies concerns r/t medication management or prescription cost. Reviewed established cholesterol goals. Reviewed role and benefits of statin for ASCVD risk reduction. Discussed strategies to manage statin-induced myalgias. Reports tolerating rosuvastatin  well. Reviewed exercise/activity goals and importance of engaging in low impact activities with goal of 150 min/week. Reports recent stumbles d/t previous podiatry procedure and possible need for Orthotics. Reviewed safety and fall prevention strategies. Reviewed importance of limiting foods high in cholesterol. Discussed importance of completing regular laboratory monitoring as prescribed Reviewed s/sx of heart attack, stroke and worsening symptoms that require immediate medical attention.    BP Readings from Last 3 Encounters:  04/15/23 120/76  04/15/23 (!) 149/83  04/15/23 (!) 140/80    Lab Results  Component Value Date   CHOL 133 04/07/2023   HDL 67.80 04/07/2023   LDLCALC 52 04/07/2023   LDLDIRECT 88.0 03/25/2019   TRIG 66.0 04/07/2023   CHOLHDL 2 04/07/2023     Self-Care/Symptom Management: Take medications as prescribed   Attend all scheduled provider appointments Call pharmacy for medication refills 3-7 days in advance of running out of medications Call doctor with any symptoms you believe are related to your medicine Read nutrition labels. Eat whole grains, fruits and vegetables, lean meats and healthy fats.  Limit foods high in cholesterol Call provider office for new concerns or questions        Smoking Cessation       Current Barriers:  Care Management support and education needs related to Smoking Cessation  Planned Interventions: Smoking Cessation Reviewed current treatment plan for smoking cessation. Reports significant improvements. Notes previously smoking  more than a pack a day. Currently smoking less than a pack in a week. Reviewed smoking cessation techniques. She is currently taking Chantrix and very pleased with results. She reports starting the Starter Pack at 0.5 mg/day. Notes attempting to increase dose but experiencing extreme nightmares. Reports she has been doing well with the 0.5 dose and prefers to stay at this dose for maintenance.  Thorough  discussion regarding various methods to continue improving cessation plan including removing cigarettes and smoking materials from environment, stress management, substitution of other forms of reinforcement, support of family/friends. Decline current need to engage in local smoking cessation programs. Will message Dr. Tama Headings regarding request for Chantrix maintenance dose of 0.5mg .   Symptom Management: Take medications as prescribed   Attend all scheduled provider appointments Follow strategies and methods to continue improving cessation plan Call provider office for new concerns or questions              PLAN Will message Endocrinology team regarding medications and Dexcom/Omnipod devices Will message Primary Care Provider regarding Chantrix    Juanell Fairly [facility] RN Care Manager Direct Dial: [phone]  Fax: [phone] Website: [email or web address]

## 2023-07-01 ENCOUNTER — Other Ambulatory Visit: Payer: Self-pay | Admitting: Internal Medicine

## 2023-07-01 ENCOUNTER — Other Ambulatory Visit: Payer: Self-pay

## 2023-07-01 MED ORDER — TIRZEPATIDE 7.5 MG/0.5ML ~~LOC~~ SOAJ: 7.5000 mg | SUBCUTANEOUS | 3 refills | Status: DC

## 2023-07-09 NOTE — Patient Outreach (Signed)
 Care Management   Visit Note  07/09/2023 Name: Tanya Graves MRN: [id number] DOB: [birth year]  Subjective: Tanya Graves is a 58 y.o. year old female who is a primary care patient of Tanya Greig BRAVO, MD. The Care Management team was consulted for assistance.      Engaged with Tanya Graves via telephone.  Assessment:  Review of patient past medical history, allergies, medications, health status, including review of consultants reports, laboratory and other test data, was performed as part of  evaluation and provision of care management services.   Outpatient Encounter Medications as of 07/01/2023  Medication Sig Note   tirzepatide  (MOUNJARO ) 7.5 MG/0.5ML Pen Inject 7.5 mg into the skin once a week.    albuterol  (VENTOLIN  HFA) 108 (90 Base) MCG/ACT inhaler INHALE 2 PUFFS BY MOUTH EVERY 6 HOURS AS NEEDED FOR WHEEZING OR SHORTNESS OF BREATH 06/30/2023: Needs new order    Continuous Glucose Sensor (DEXCOM G7 SENSOR) MISC 1 Device by Does not apply route as directed.    estradiol  (ESTRACE ) 0.1 MG/GM vaginal cream PLACE 1/4 APPLICATORFUL VAGINALLY AT BEDTIME (Patient not taking: Reported on 04/15/2023)    Glucagon  (BAQSIMI  ONE PACK) 3 MG/DOSE POWD Place 1 Device into the nose as directed.    glucose blood (CONTOUR NEXT TEST) test strip USE TO TEST BLOOD SUGAR 6 TIMES A DAY    insulin  aspart (NOVOLOG ) 100 UNIT/ML injection INJECT A MAXIMUM OF 60 UNITS UNDER THE SKIN DAILY    Insulin  Disposable Pump (OMNIPOD 5 G7 PODS, GEN 5,) MISC 1 Device by Does not apply route every other day.    levothyroxine  (SYNTHROID ) 200 MCG tablet Take 1 tablet (200 mcg total) by mouth daily.    meloxicam  (MOBIC ) 15 MG tablet Take 1 tablet (15 mg total) by mouth daily.    rosuvastatin  (CRESTOR ) 40 MG tablet Take 1 tablet (40 mg total) by mouth daily.    varenicline  (CHANTIX  CONTINUING MONTH PAK) 1 MG tablet Take 1 tablet (1 mg total) by mouth 2 (two) times daily.    Varenicline  Tartrate, Starter, (CHANTIX  STARTING MONTH PAK)  0.5 MG X 11 & 1 MG X 42 TBPK Take one 0.5 mg tab daily for 3 days,then inc. to one 0.5 mg tab BID x 4 days, then inc. to one 1 mg tab BID.    No facility-administered encounter medications on file as of 07/01/2023.      Goals      Monitor, Self-Manage And Reduce Symptoms of Diabetes     Current Barriers:  Care Management support and education needs related to Diabetes  Planned Interventions: Diabetes Reviewed provider's plan for diabetes management.  Reviewed medications. Reports taking medications as prescribed. Denies current concerns related to medication management or prescription cost.  Confirmed being approved for mounjaro  and restarted 5 mg dose. She is interested in increasing her dose to 7.5 mg/weekly. Will relay message to Dr.  Sam. Expressed concerns regarding frequent device errors with her Dexcom G7 and Omnipod. Reports previously wearing the Omnipod on her abdomen but being instructed that all of the devices should be worn on her arm to decrease incidents of misreads. Reports this has become very uncomfortable and notes frequently hitting/bumping her Omnipod with arm movements. She is requesting additional advice from the Endocrinology team regarding options for placement. Currently prefers to place all devices on her abdomen.  Reports recent fluctuations with blood glucose readings mainly due to diet choices. Reports lowest reading was 45 mg/dl. Reports high reading in the 300's due to holding  insulin .  Reviewed information regarding hypoglycemia and hyperglycemia along with appropriate interventions Reviewed nutritional intake and importance of complying with a diabetic diet. Reports significant improvements with meals. Notes consumption of carbs/added sugars usually occurs with snacks. Thorough discussion low carb snack options.  Advised to increase consumption of fruits, vegetables, and lean proteins. Advised to continue monitoring intake of carbohydrates and avoiding foods  and beverages with added sugar when possible. Encouraged to notify the team if additional referral for nutrition/dietician is needed. Discussed importance of completing recommended DM preventive care. She is due for an Eye Exam. She will contact [facility] to schedule. Completing foot care with Podiatrist/Dr.  Discussed importance of completing ordered labs as prescribed.  Will message the Endocrinology team regarding mounjaro  and Dexcom/Omnipod devices. Update 07/01/23: Updated Tanya Graves regarding medication order. Collaborated with Endocrinology team/Dr. Sam. Dose for mounjaro  was increased to 7.5 mg/week. Tanya Graves will follow up with Dr. Sam on 07/22/23. Location of Dexcom/Omnipod will be discussed at that time.   Lab Results  Component Value Date   HGBA1C 8.6 (H) 04/07/2023    Symptom Management: Attend all scheduled provider appointments Take medications as prescribed Monitor blood glucose and respond to Dexcom alarms appropriate Follow up with Dr. Sam as scheduled on 07/22/23 Check feet daily for cuts, sores or redness Wash and dry feet carefully every day Wear comfortable, cotton socks Wear comfortable, well-fitting shoes Engage in low impact activities/exercises as tolerated Read food labels for fat, fiber, carbohydrates and portion size Decrease consumption of foods/beverages with high carbohydrate content and added sugars Contact [facility] to schedule exam Call provider office for new concerns or questions              PLAN Will follow up next month   Jackson Acron [facility] RN Care Manager Direct Dial : [phone]  Fax: [phone] Website: [email or web address]

## 2023-07-14 ENCOUNTER — Other Ambulatory Visit: Payer: Self-pay | Admitting: Obstetrics and Gynecology

## 2023-07-14 ENCOUNTER — Other Ambulatory Visit: Payer: Self-pay | Admitting: Family Medicine

## 2023-07-14 ENCOUNTER — Other Ambulatory Visit: Payer: Self-pay | Admitting: Podiatry

## 2023-07-14 ENCOUNTER — Other Ambulatory Visit: Payer: Self-pay | Admitting: Internal Medicine

## 2023-07-14 DIAGNOSIS — R062 Wheezing: Secondary | ICD-10-CM

## 2023-07-14 DIAGNOSIS — E1169 Type 2 diabetes mellitus with other specified complication: Secondary | ICD-10-CM

## 2023-07-14 DIAGNOSIS — N951 Menopausal and female climacteric states: Secondary | ICD-10-CM

## 2023-07-14 MED ORDER — ALBUTEROL SULFATE HFA 108 (90 BASE) MCG/ACT IN AERS: 2.0000 | INHALATION_SPRAY | Freq: Four times a day (QID) | RESPIRATORY_TRACT | 0 refills | Status: DC | PRN

## 2023-07-14 NOTE — Addendum Note (Signed)
 Addended by: Damita Lack on: 07/14/2023 11:45 AM   Modules accepted: Orders

## 2023-07-22 ENCOUNTER — Encounter: Payer: Self-pay | Admitting: Internal Medicine

## 2023-07-22 ENCOUNTER — Ambulatory Visit: Payer: BC Managed Care – PPO | Admitting: Internal Medicine

## 2023-07-22 VITALS — BP 130/70 | HR 92 | Ht 68.0 in | Wt 268.0 lb

## 2023-07-22 DIAGNOSIS — E1042 Type 1 diabetes mellitus with diabetic polyneuropathy: Secondary | ICD-10-CM | POA: Diagnosis not present

## 2023-07-22 DIAGNOSIS — E890 Postprocedural hypothyroidism: Secondary | ICD-10-CM

## 2023-07-22 DIAGNOSIS — Z794 Long term (current) use of insulin: Secondary | ICD-10-CM

## 2023-07-22 DIAGNOSIS — E1065 Type 1 diabetes mellitus with hyperglycemia: Secondary | ICD-10-CM | POA: Diagnosis not present

## 2023-07-22 DIAGNOSIS — E139 Other specified diabetes mellitus without complications: Secondary | ICD-10-CM

## 2023-07-22 LAB — POCT GLYCOSYLATED HEMOGLOBIN (HGB A1C): Hemoglobin A1C: 7.5 % — AB (ref 4.0–5.6)

## 2023-07-22 NOTE — Progress Notes (Signed)
 Name: Tanya Graves  Age/ Sex: 58 y.o., female   MRN/ DOB: [id number], 06-23-1965     PCP: Excell Seltzer, MD   Reason for Endocrinology Evaluation: Type 2 Diabetes Mellitus  Initial Endocrine Consultative Visit: 07/08/2018    PATIENT IDENTIFIER: [name] is a 58 y.o. female with a past medical history of T2DM, HTN, Dyslipidemia and hypothyroidism . The patient has followed with Endocrinology clinic since 07/08/2018 for consultative assistance with management of her diabetes.  DIABETIC HISTORY:  [name] was diagnosed with T2DM ~ 2010.  She does not recall intolerance to other oral glycemic agents.  On her initial visit here she was on MDI regimen of insulin and metformin. Her hemoglobin A1c has ranged from 11.4%  in 2016, peaking at 12.5% in 2019  GLP-1 and SGLT-2 inhibitors have been cost prohibitive in the past    She has been off metformin since October 2022  DKA 12/2021  Her diagnosis was changed to Type 1 DM with an elevated GAD-65 > 250  ( <5IU/mL and islet Ab 80 ( reference <1.25)  I prescribed mounjaro before her LADA Dx but this was discontinued after hospitalization for DKA in 04/2022 and we opted to start Ozempic 07/2022  Intolerant to Ozempic-GI side effects  Mounjaro was not covered due to being T1 DM   Thyroid Disease   She is S/P RAI ablation years ago > 20 yrs ago and has been on levothyroxine . She is on Levothyroxine 175 mcg daily   SUBJECTIVE:   During the last visit (04/15/2023): A1c 8.6% .     Today (07/22/2023): [name] for a follow-up on diabetes management.  She checks her glucose multiple times a day through CGM  She quit tobacco ~ 3 months ago  Denies nausea or vomiting  No GERD symptoms  Has occasional constipation, which resolves with proper hydration   This patient with type 1 diabetes is treated with Omnipod (insulin pump). During the visit the pump basal and bolus doses were reviewed including carb/insulin rations and  supplemental doses. The clinical list was updated. The glucose meter download was reviewed in detail to determine if the current pump settings are providing the best glycemic control without excessive hypoglycemia.  Pump and meter download:     Pump   Omnipod Settings   Insulin type   Novolog   Basal rate       0000 0.75 u/h               I:C ratio       0000 1:1    12 g TID , 6 g with snacks              Sensitivity       0000  35      Goal       0000  120           PUMP STATISTICS: Average BG: 177 Average Daily Carbs (g): 29.1 Average Total Daily Insulin: 71.4 Average Daily Basal: 40.9 (57 %) Average Daily Bolus: 43 (30.5 %)     CONTINUOUS GLUCOSE MONITORING RECORD INTERPRETATION    Dates of Recording: 2/12-2/25/2025  Sensor description:dexcom  Results statistics:   CGM use % of time 87.8  Average and SD 177/77  Time in range 60%  % Time Above 180 20  % Time above 250 16  % Time Below target 3   Glycemic patterns summary: BGs are overnight and increased throughout the day  Hyperglycemic episodes  postprandial   Hypoglycemic episodes occurred rarely without a pattern  Overnight periods: optimal      HOME DIABETES REGIMEN:  Novolog  Mounjaro 5 mg weekly  Rosuvastatin 40 mg daily      DIABETIC COMPLICATIONS: Microvascular complications:  Neuropathy , worsening DR Denies: CKD  Last eye exam: Completed  07/17/2020   Macrovascular complications:    Denies: CAD, PVD, CVA    HISTORY:  Past Medical History:  Past Medical History:  Diagnosis Date   Arthritis    Diabetes mellitus without complication (HCC)    type 2   Thyroid disease    Past Surgical History:  Past Surgical History:  Procedure Laterality Date   APPENDECTOMY  1993   CALCANEAL OSTEOTOMY Left 07/17/2016   Procedure: CALCANEAL OSTEOTOMY;  Surgeon: Deeann Saint, MD;  Location: [facility] ORS;  Service: Orthopedics;  Laterality: Left;   COLONOSCOPY  12/18/2018   found  1 3mm polyp, going back in 5 years    COLONOSCOPY WITH PROPOFOL N/A 12/18/2018   Procedure: COLONOSCOPY WITH PROPOFOL;  Surgeon: Wyline Mood, MD;  Location: [facility] ENDOSCOPY;  Service: Gastroenterology;  Laterality: N/A;   IRRIGATION AND DEBRIDEMENT ABSCESS N/A 01/24/2015   Procedure: IRRIGATION AND DEBRIDEMENT ABSCESS/GROIN ABSCESS;  Surgeon: Ida Rogue, MD;  Location: [facility] ORS;  Service: General;  Laterality: N/A;   KNEE ARTHROSCOPY W/ MENISCAL REPAIR Right 2008   ROTATOR CUFF REPAIR Right 2015   TONSILLECTOMY  1998   Social History:  reports that she has been smoking cigarettes. She started smoking about 14 months ago. She has a 7.6 pack-year smoking history. She has never used smokeless tobacco. She reports that she does not currently use alcohol. She reports that she does not use drugs. Family History:  Family History  Problem Relation Age of Onset   Diabetes Father    Heart failure Father    Diabetes Brother    Arthritis Mother    Diabetes Mother    Transient ischemic attack Mother    Heart failure Maternal Grandmother    Alcohol abuse Maternal Grandmother    Diabetes Paternal Grandmother    Kidney failure Paternal Grandmother    Heart failure Paternal Grandfather    Cancer Paternal Grandfather        unknown type   Hypertension Brother    Breast cancer Maternal Great-grandmother     OBJECTIVE:   Vital Signs:BP 130/70 (BP Location: Left Arm, Patient Position: Sitting, Cuff Size: Normal)   Pulse 92   Ht 5\' 8"  (1.727 m)   Wt 268 lb (121.6 kg)   LMP 06/28/2015 (Approximate)   SpO2 99%   BMI 40.75 kg/m   Wt Readings from Last 3 Encounters:  07/22/23 268 lb (121.6 kg)  04/15/23 275 lb (124.7 kg)  04/15/23 274 lb 11.2 oz (124.6 kg)     Exam: General: Pt appears well and is in NAD  Lungs: Clear with good BS bilat   Heart: RRR   Neuro: MS is good with appropriate affect, pt is alert and Ox3   DM foot exam: 03/14/2023 per podiatry The skin of the feet is  intact without sores or ulcerations. The pedal pulses are 2+ on right and 2+ on left. The sensation is intact to a screening 5.07, 10 gram monofilament bilaterally     DATA REVIEWED:  Lab Results  Component Value Date   HGBA1C 7.5 (A) 07/22/2023   HGBA1C 8.6 (H) 04/07/2023   HGBA1C 7.6 (A) 10/31/2022    Latest Reference Range &  Units 04/15/23 15:38  TSH 0.35 - 5.50 uIU/mL 0.45  T4,Free(Direct) 0.60 - 1.60 ng/dL 1.61     Latest Reference Range & Units 04/07/23 16:15  Sodium 135 - 145 mEq/L 141  Potassium 3.5 - 5.1 mEq/L 4.7  Chloride 96 - 112 mEq/L 105  CO2 19 - 32 mEq/L 29  Glucose 70 - 99 mg/dL 096 (H)  BUN 6 - 23 mg/dL 23  Creatinine 0.45 - 4.09 mg/dL 8.11  Calcium 8.4 - 91.4 mg/dL 9.8  Alkaline Phosphatase 39 - 117 U/L 112  Albumin 3.5 - 5.2 g/dL 4.3  AST 0 - 37 U/L 16  ALT 0 - 35 U/L 14  Total Protein 6.0 - 8.3 g/dL 6.8  Total Bilirubin 0.2 - 1.2 mg/dL 0.3  GFR >78.29 mL/min 70.65    Latest Reference Range & Units 04/07/23 16:15  Total CHOL/HDL Ratio  2  Cholesterol 0 - 200 mg/dL 562  HDL Cholesterol >13.08 mg/dL 65.78  LDL (calc) 0 - 99 mg/dL 52  NonHDL  46.96  Triglycerides 0.0 - 149.0 mg/dL 29.5  VLDL 0.0 - 28.4 mg/dL 13.2       ISLET CELL ANTIBODY SCREEN NEGATIVE POSITIVE   Glutamic Acid Decarb Ab <5 IU/mL >250 High     ASSESSMENT / PLAN / RECOMMENDATIONS:  1)Type 1 Diabetes Mellitus (LADA) , Sub-optimally  controlled, With Neuropathic and retinopathic complications - Most recent A1c of 7.5%. Goal A1c < 7.0 %.   -A1c trending down -Patient continues with glycemic excursions -She is intolerant to Ozempic -SGLT2 inhibitors are to be avoided due to risk of DKA -Intolerant to metformin -Patient has been noted with hypoglycemia in late evening, she is currently still finishing Mounjaro 5 mg dose, I will reduce basal rate as below in anticipation of increasing Mounjaro to 7.5 mg in 3 weeks   MEDICATIONS: Mounjaro 7.5 mg weekly NovoLog   Pump    Omnipod Settings   Insulin type   Novolog   Basal rate       0000 0.70 u/h               I:C ratio       0000 1:1    #12 g TID     # 6 g with snacks          Sensitivity       0000  30      Goal       0000  120        EDUCATION / INSTRUCTIONS: BG monitoring instructions: Patient is instructed to check her blood sugars 4 times a day, before meals and bedtime. Call [facility] if: BG persistently < 70  I reviewed the Rule of 15 for the treatment of hypoglycemia in detail with the patient. Literature supplied.  2) Post ablative hypothyroidism:  -Patient is clinically euthyroid -TSH has been normal  Medication  Continue levothyroxine 200 mcg daily   3) Dyslipidemia :    -Lipids from 04/07/2023 were reviewed with normal triglycerides and LDL  Medication  Continue rosuvastatin 40 mg daily   F/U in 4 months    I spent 40 minutes preparing to see the patient by review of recent labs, imaging and procedures, obtaining and reviewing separately obtained history, communicating with the patient, ordering medications, tests or procedures, and documenting clinical information in the EHR including the differential Dx, treatment, and any further evaluation and other management   Signed electronically by: Lyndle Herrlich, MD  [facility] Endocrinology  [facility] [address] [place], [place] [address] Phone: [phone] FAX: [phone]   CC: Excell Seltzer, MD [address] [place] [address] Phone: [phone]  Fax: [phone]  Return to Endocrinology clinic as below: Future Appointments  Date Time Provider Department Center  07/29/2023 11:30 AM Juanell Fairly, RN [facility] None

## 2023-07-22 NOTE — Patient Instructions (Addendum)
 Mounjaro 7.5 mg weekly   - HOW TO TREAT LOW BLOOD SUGARS (Blood sugar LESS THAN 70 MG/DL) Please follow the RULE OF 15 for the treatment of hypoglycemia treatment (when your (blood sugars are less than 70 mg/dL)   STEP 1: Take 15 grams of carbohydrates when your blood sugar is low, which includes:  3-4 GLUCOSE TABS  OR 3-4 OZ OF JUICE OR REGULAR SODA OR ONE TUBE OF GLUCOSE GEL    STEP 2: RECHECK blood sugar in 15 MINUTES STEP 3: If your blood sugar is still low at the 15 minute recheck --> then, go back to STEP 1 and treat AGAIN with another 15 grams of carbohydrates.

## 2023-07-23 ENCOUNTER — Encounter: Payer: Self-pay | Admitting: Internal Medicine

## 2023-07-29 ENCOUNTER — Other Ambulatory Visit: Payer: Self-pay

## 2023-07-29 NOTE — Patient Outreach (Signed)
  Care Management     07/29/2023 Name: Tanya Graves MRN: [id number] DOB: 1966-01-24  Subjective: Tanya Graves is a 58 y.o. year old female who is a primary care patient of Excell Seltzer, MD.   Tanya Graves was scheduled for outreach with nurse case manager today. Reports attempting to reschedule via the automated system without success. Requested to reschedule due work meetings.    PLAN Tanya Graves will call to reschedule when available    Juanell Fairly [facility] RN Care Manager Direct Dial: [phone]  Fax: [phone] Website: [email or web address]

## 2023-08-26 ENCOUNTER — Encounter: Payer: Self-pay | Admitting: Podiatry

## 2023-08-29 ENCOUNTER — Encounter: Payer: Self-pay | Admitting: Podiatry

## 2023-08-29 ENCOUNTER — Ambulatory Visit: Admitting: Podiatry

## 2023-08-29 DIAGNOSIS — S90222A Contusion of left lesser toe(s) with damage to nail, initial encounter: Secondary | ICD-10-CM

## 2023-09-02 ENCOUNTER — Other Ambulatory Visit: Payer: Self-pay

## 2023-09-02 NOTE — Patient Outreach (Unsigned)
 Complex Care Management   Visit Note  09/02/2023  Name:  Tanya Graves MRN: [id number] DOB: 10/25/1965  Situation: Referral received for Complex Care Management related to {Criteria:32550} I obtained verbal consent from ***.  Visit completed with ***  {VISIT LOCATION:32553}  Background:   Past Medical History:  Diagnosis Date   Arthritis    Diabetes mellitus without complication (HCC)    type 2   Thyroid disease     Assessment: Patient Reported Symptoms:  Cognitive    Neurological      HEENT      Cardiovascular      Respiratory      Endocrine      Gastrointestinal      Genitourinary      Integumentary      Musculoskeletal      Psychosocial       There were no vitals filed for this visit.  Medications Reviewed Today     Reviewed by Juanell Fairly, RN (Registered Nurse) on 09/02/23 at 1254  Med List Status: <None>   Medication Order Taking? Sig Documenting Provider Last Dose Status Informant  albuterol (VENTOLIN HFA) 108 (90 Base) MCG/ACT inhaler [id number] No Inhale 2 puffs into the lungs every 6 (six) hours as needed for wheezing or shortness of breath. Excell Seltzer, MD Taking Active   BAQSIMI ONE PACK 3 MG/DOSE POWD [id number] No PLACE 1 DEVICE INTO THE NOSE AS DIRECTED Shamleffer, Konrad Dolores, MD Taking Active   Continuous Glucose Sensor (DEXCOM G7 SENSOR) MISC [id number] No 1 Device by Does not apply route as directed. Shamleffer, Konrad Dolores, MD Taking Active   estradiol (ESTRACE) 0.1 MG/GM vaginal cream [id number] No PLACE 1/4 APPLICATORFUL VAGINALLY AT BEDTIME Linzie Collin, MD Taking Active   glucose blood (CONTOUR NEXT TEST) test strip [id number] No USE TO TEST BLOOD SUGAR 6 TIMES A DAY Shamleffer, Konrad Dolores, MD Taking Active   insulin aspart (NOVOLOG) 100 UNIT/ML injection [id number] No INJECT A MAXIMUM OF 60 UNITS UNDER THE SKIN DAILY Shamleffer, Konrad Dolores, MD Taking Active   Insulin Disposable Pump (OMNIPOD 5 G7 PODS, GEN 5,)  MISC [id number] No 1 Device by Does not apply route every other day. Shamleffer, Konrad Dolores, MD Taking Active   levothyroxine (SYNTHROID) 200 MCG tablet [id number] No Take 1 tablet (200 mcg total) by mouth daily. Shamleffer, Konrad Dolores, MD Taking Active   meloxicam [facility]) 15 MG tablet [id number] No TAKE 1 TABLET BY MOUTH DAILY Arland Usery Shelling, DPM Taking Active   rosuvastatin (CRESTOR) 40 MG tablet [id number] No TAKE 1 TABLET BY MOUTH DAILY Shamleffer, Konrad Dolores, MD Taking Active   tirzepatide [facility]) 7.5 MG/0.5ML Pen [id number] No Inject 7.5 mg into the skin once a week. Shamleffer, Konrad Dolores, MD Taking Active   varenicline (CHANTIX CONTINUING MONTH PAK) 1 MG tablet [id number] No Take 1 tablet (1 mg total) by mouth 2 (two) times daily. Excell Seltzer, MD Taking Active             Recommendation:   {RECOMMENDATONS:32554}  Follow Up Plan:   {FOLLOWUP:32559}  SIG ***

## 2023-09-03 ENCOUNTER — Other Ambulatory Visit

## 2023-09-03 NOTE — Patient Instructions (Addendum)
 Thank you for allowing the  Care Management team to participate in your care. It was great speaking with you today!  We will follow up on November 20, 2023 at 12:30pm. Please do not hesitate to contact me if you require assistance prior to our next outreach.    Juanell Fairly [facility] RN Care Manager Direct Dial: [phone]  Fax: [phone] Website: [email or web address]

## 2023-09-10 NOTE — Progress Notes (Signed)
   Chief Complaint  Patient presents with   Nail Problem    right foot bih toe nail, and the next two toe nails are turning purple. I am type 1 diabetic and very concerned about this change. I do not remember an injury to them and they don't hurt pressing on them. they get darker every day.    HPI: 58 y.o. female presenting today for concern of discoloration to the toenails bilateral.  Patient has noticed that her toenails have become dark over the past few weeks.  She does not recall any history of injury.  Past Medical History:  Diagnosis Date   Arthritis    Diabetes mellitus without complication (HCC)    type 2   Thyroid disease     Past Surgical History:  Procedure Laterality Date   APPENDECTOMY  1993   CALCANEAL OSTEOTOMY Left 07/17/2016   Procedure: CALCANEAL OSTEOTOMY;  Surgeon: Marlynn Singer, MD;  Location: [facility] ORS;  Service: Orthopedics;  Laterality: Left;   COLONOSCOPY  12/18/2018   found 1 3mm polyp, going back in 5 years    COLONOSCOPY WITH PROPOFOL N/A 12/18/2018   Procedure: COLONOSCOPY WITH PROPOFOL;  Surgeon: Luke Salaam, MD;  Location: [facility] ENDOSCOPY;  Service: Gastroenterology;  Laterality: N/A;   IRRIGATION AND DEBRIDEMENT ABSCESS N/A 01/24/2015   Procedure: IRRIGATION AND DEBRIDEMENT ABSCESS/GROIN ABSCESS;  Surgeon: Scherry Curtis, MD;  Location: [facility] ORS;  Service: General;  Laterality: N/A;   KNEE ARTHROSCOPY W/ MENISCAL REPAIR Right 2008   ROTATOR CUFF REPAIR Right 2015   TONSILLECTOMY  1998    No Known Allergies   Physical Exam: General: The patient is alert and oriented x3 in no acute distress.  Dermatology: Skin is warm, dry and supple bilateral lower extremities.  The actual toes and soft tissue to the toes are healthy and viable with immediate capillary refill.  No open lacerations.  There is subungual bruising noted to the nail plates but this appears very stable.  No obvious subungual hematoma that needs to be addressed  Vascular: Palpable  pedal pulses bilaterally. Capillary refill within normal limits.  No appreciable edema.  No erythema.  Again, capillary refill to the toes immediate.  Clinically no concern for vascular compromise or infection  Neurological: Light touch and protective threshold diminished  Musculoskeletal Exam: Mild hammertoe deformity noted to the lateral   Assessment/Plan of Care: 1.  Bruising to the nail plates bilateral toes  -Patient evaluated -Assured the patient that clinically there is nothing concerning regarding the circulation in her feet.  The toenails are very stable with mild ecchymosis to the nail plate which should resolve and grow out with time. -Continue observation for now as the nail plates grow out -Return to clinic as needed     Dot Gazella, DPM [facility]  Dr. Dot Gazella, DPM    [address] [place], [place] [address]                Office [phone]  Fax [phone]

## 2023-09-15 LAB — HM DIABETES EYE EXAM

## 2023-09-25 ENCOUNTER — Encounter: Payer: Self-pay | Admitting: Family Medicine

## 2023-10-03 ENCOUNTER — Other Ambulatory Visit: Payer: Self-pay | Admitting: Internal Medicine

## 2023-10-03 ENCOUNTER — Other Ambulatory Visit: Payer: Self-pay | Admitting: Obstetrics and Gynecology

## 2023-10-03 ENCOUNTER — Other Ambulatory Visit: Payer: Self-pay | Admitting: Family Medicine

## 2023-10-03 DIAGNOSIS — R062 Wheezing: Secondary | ICD-10-CM

## 2023-10-03 DIAGNOSIS — N951 Menopausal and female climacteric states: Secondary | ICD-10-CM

## 2023-10-03 NOTE — Telephone Encounter (Signed)
 Lasix  was accidentally approved by was discontinued by Dr. Cherlyn Cornet on 11.19.2024.  I called [facility] and cancelled refill.

## 2023-11-07 ENCOUNTER — Other Ambulatory Visit: Payer: Self-pay | Admitting: Podiatry

## 2023-11-10 ENCOUNTER — Other Ambulatory Visit: Payer: Self-pay

## 2023-11-10 ENCOUNTER — Inpatient Hospital Stay
Admission: EM | Admit: 2023-11-10 | Discharge: 2023-11-13 | DRG: 638 | Disposition: A | Discharge status: Home / Self Care | Attending: Student | Admitting: Student

## 2023-11-10 DIAGNOSIS — E7800 Pure hypercholesterolemia, unspecified: Secondary | ICD-10-CM | POA: Diagnosis present

## 2023-11-10 DIAGNOSIS — Z6841 Body Mass Index (BMI) 40.0 and over, adult: Secondary | ICD-10-CM

## 2023-11-10 DIAGNOSIS — Z79890 Hormone replacement therapy (postmenopausal): Secondary | ICD-10-CM

## 2023-11-10 DIAGNOSIS — N179 Acute kidney failure, unspecified: Secondary | ICD-10-CM | POA: Diagnosis present

## 2023-11-10 DIAGNOSIS — E1010 Type 1 diabetes mellitus with ketoacidosis without coma: Secondary | ICD-10-CM | POA: Diagnosis not present

## 2023-11-10 DIAGNOSIS — E875 Hyperkalemia: Secondary | ICD-10-CM | POA: Diagnosis present

## 2023-11-10 DIAGNOSIS — Z79899 Other long term (current) drug therapy: Secondary | ICD-10-CM

## 2023-11-10 DIAGNOSIS — E1110 Type 2 diabetes mellitus with ketoacidosis without coma: Secondary | ICD-10-CM | POA: Diagnosis present

## 2023-11-10 DIAGNOSIS — D509 Iron deficiency anemia, unspecified: Secondary | ICD-10-CM | POA: Diagnosis present

## 2023-11-10 DIAGNOSIS — Z8261 Family history of arthritis: Secondary | ICD-10-CM

## 2023-11-10 DIAGNOSIS — M1990 Unspecified osteoarthritis, unspecified site: Secondary | ICD-10-CM | POA: Diagnosis present

## 2023-11-10 DIAGNOSIS — E860 Dehydration: Secondary | ICD-10-CM | POA: Diagnosis present

## 2023-11-10 DIAGNOSIS — E039 Hypothyroidism, unspecified: Secondary | ICD-10-CM | POA: Diagnosis not present

## 2023-11-10 DIAGNOSIS — D72829 Elevated white blood cell count, unspecified: Secondary | ICD-10-CM | POA: Diagnosis not present

## 2023-11-10 DIAGNOSIS — R739 Hyperglycemia, unspecified: Secondary | ICD-10-CM | POA: Diagnosis not present

## 2023-11-10 DIAGNOSIS — E876 Hypokalemia: Secondary | ICD-10-CM | POA: Diagnosis present

## 2023-11-10 DIAGNOSIS — I5032 Chronic diastolic (congestive) heart failure: Secondary | ICD-10-CM | POA: Diagnosis present

## 2023-11-10 DIAGNOSIS — E669 Obesity, unspecified: Secondary | ICD-10-CM | POA: Diagnosis present

## 2023-11-10 DIAGNOSIS — Z87891 Personal history of nicotine dependence: Secondary | ICD-10-CM

## 2023-11-10 DIAGNOSIS — E559 Vitamin D deficiency, unspecified: Secondary | ICD-10-CM | POA: Diagnosis present

## 2023-11-10 DIAGNOSIS — R109 Unspecified abdominal pain: Secondary | ICD-10-CM | POA: Diagnosis present

## 2023-11-10 DIAGNOSIS — Z9641 Presence of insulin pump (external) (internal): Secondary | ICD-10-CM | POA: Diagnosis present

## 2023-11-10 DIAGNOSIS — Z7985 Long-term (current) use of injectable non-insulin antidiabetic drugs: Secondary | ICD-10-CM

## 2023-11-10 DIAGNOSIS — T39395A Adverse effect of other nonsteroidal anti-inflammatory drugs [NSAID], initial encounter: Secondary | ICD-10-CM | POA: Diagnosis present

## 2023-11-10 DIAGNOSIS — E0810 Diabetes mellitus due to underlying condition with ketoacidosis without coma: Principal | ICD-10-CM

## 2023-11-10 DIAGNOSIS — E66813 Obesity, class 3: Secondary | ICD-10-CM | POA: Diagnosis present

## 2023-11-10 DIAGNOSIS — E1065 Type 1 diabetes mellitus with hyperglycemia: Secondary | ICD-10-CM | POA: Diagnosis not present

## 2023-11-10 DIAGNOSIS — Z833 Family history of diabetes mellitus: Secondary | ICD-10-CM

## 2023-11-10 DIAGNOSIS — Z803 Family history of malignant neoplasm of breast: Secondary | ICD-10-CM

## 2023-11-10 DIAGNOSIS — I9589 Other hypotension: Secondary | ICD-10-CM | POA: Diagnosis present

## 2023-11-10 DIAGNOSIS — Z8249 Family history of ischemic heart disease and other diseases of the circulatory system: Secondary | ICD-10-CM

## 2023-11-10 DIAGNOSIS — Z794 Long term (current) use of insulin: Secondary | ICD-10-CM

## 2023-11-10 DIAGNOSIS — Z823 Family history of stroke: Secondary | ICD-10-CM

## 2023-11-10 LAB — CBG MONITORING, ED
Glucose-Capillary: 511 mg/dL (ref 70–99)
Glucose-Capillary: 517 mg/dL (ref 70–99)
Glucose-Capillary: 523 mg/dL (ref 70–99)
Glucose-Capillary: >600 mg/dL (ref 70–99)
Glucose-Capillary: >600 mg/dL (ref 70–99)
Glucose-Capillary: >600 mg/dL (ref 70–99)

## 2023-11-10 LAB — CBC WITH DIFFERENTIAL/PLATELET
Abs Immature Granulocytes: 0.18 10*3/uL — ABNORMAL HIGH (ref 0.00–0.07)
Basophils Absolute: 0.1 10*3/uL (ref 0.0–0.1)
Basophils Relative: 0 %
Eosinophils Absolute: 0 10*3/uL (ref 0.0–0.5)
Eosinophils Relative: 0 %
HCT: 37.9 % (ref 36.0–46.0)
Hemoglobin: 12.3 g/dL (ref 12.0–15.0)
Immature Granulocytes: 1 %
Lymphocytes Relative: 6 %
Lymphs Abs: 1 10*3/uL (ref 0.7–4.0)
MCH: 32 pg (ref 26.0–34.0)
MCHC: 32.5 g/dL (ref 30.0–36.0)
MCV: 98.7 fL (ref 80.0–100.0)
Monocytes Absolute: 0.7 10*3/uL (ref 0.1–1.0)
Monocytes Relative: 4 %
Neutro Abs: 15.6 10*3/uL — ABNORMAL HIGH (ref 1.7–7.7)
Neutrophils Relative %: 89 %
Platelets: 220 10*3/uL (ref 150–400)
RBC: 3.84 MIL/uL — ABNORMAL LOW (ref 3.87–5.11)
RDW: 12.4 % (ref 11.5–15.5)
WBC: 17.6 10*3/uL — ABNORMAL HIGH (ref 4.0–10.5)
nRBC: 0 % (ref 0.0–0.2)

## 2023-11-10 LAB — URINALYSIS, ROUTINE W REFLEX MICROSCOPIC
Bilirubin Urine: NEGATIVE
Glucose, UA: >=500 mg/dL — AB
Hgb urine dipstick: NEGATIVE
Ketones, ur: 20 mg/dL — AB
Leukocytes,Ua: NEGATIVE
Nitrite: NEGATIVE
Protein, ur: NEGATIVE mg/dL
Specific Gravity, Urine: 1.018 (ref 1.005–1.030)
pH: 5 (ref 5.0–8.0)

## 2023-11-10 LAB — BASIC METABOLIC PANEL WITH GFR
Anion gap: 25 — ABNORMAL HIGH (ref 5–15)
BUN: 49 mg/dL — ABNORMAL HIGH (ref 6–20)
BUN: 50 mg/dL — ABNORMAL HIGH (ref 6–20)
CO2: 9 mmol/L — ABNORMAL LOW (ref 22–32)
CO2: <7 mmol/L — ABNORMAL LOW (ref 22–32)
Calcium: 9 mg/dL (ref 8.9–10.3)
Calcium: 8.1 mg/dL — ABNORMAL LOW (ref 8.9–10.3)
Chloride: 99 mmol/L (ref 98–111)
Chloride: 102 mmol/L (ref 98–111)
Creatinine, Ser: 1.58 mg/dL — ABNORMAL HIGH (ref 0.44–1.00)
Creatinine, Ser: 1.67 mg/dL — ABNORMAL HIGH (ref 0.44–1.00)
GFR, Estimated: 38 mL/min — ABNORMAL LOW (ref >60)
GFR, Estimated: 36 mL/min — ABNORMAL LOW (ref >60)
Glucose, Bld: 667 mg/dL (ref 70–99)
Glucose, Bld: 600 mg/dL (ref 70–99)
Potassium: 5.9 mmol/L — ABNORMAL HIGH (ref 3.5–5.1)
Potassium: 5.2 mmol/L — ABNORMAL HIGH (ref 3.5–5.1)
Sodium: 133 mmol/L — ABNORMAL LOW (ref 135–145)
Sodium: 134 mmol/L — ABNORMAL LOW (ref 135–145)

## 2023-11-10 LAB — BLOOD GAS, VENOUS
Acid-base deficit: 18.4 mmol/L — ABNORMAL HIGH (ref 0.0–2.0)
Bicarbonate: 8.6 mmol/L — ABNORMAL LOW (ref 20.0–28.0)
O2 Saturation: 100 %
Patient temperature: 37
pCO2, Ven: 24 mmHg — ABNORMAL LOW (ref 44–60)
pH, Ven: 7.16 — CL (ref 7.25–7.43)
pO2, Ven: 178 mmHg — ABNORMAL HIGH (ref 32–45)

## 2023-11-10 LAB — BETA-HYDROXYBUTYRIC ACID: Beta-Hydroxybutyric Acid: >8 mmol/L — ABNORMAL HIGH (ref 0.05–0.27)

## 2023-11-10 LAB — BRAIN NATRIURETIC PEPTIDE: B Natriuretic Peptide: 326.4 pg/mL — ABNORMAL HIGH (ref 0.0–100.0)

## 2023-11-10 MED ORDER — LACTATED RINGERS IV BOLUS
1000.0000 mL | Freq: Once | INTRAVENOUS | Status: AC
  Administered 2023-11-10: 1000 mL via INTRAVENOUS

## 2023-11-10 MED ORDER — ONDANSETRON HCL 4 MG/2ML IJ SOLN
4.0000 mg | Freq: Three times a day (TID) | INTRAMUSCULAR | Status: DC | PRN
  Administered 2023-11-10 – 2023-11-12 (×3): 4 mg via INTRAVENOUS
  Filled 2023-11-10 (×3): qty 2

## 2023-11-10 MED ORDER — INSULIN REGULAR(HUMAN) IN NACL 100-0.9 UT/100ML-% IV SOLN
INTRAVENOUS | Status: DC
  Administered 2023-11-10: 9.5 [IU]/h via INTRAVENOUS
  Filled 2023-11-10: qty 100

## 2023-11-10 MED ORDER — DEXTROSE IN LACTATED RINGERS 5 % IV SOLN: INTRAVENOUS | Status: AC

## 2023-11-10 MED ORDER — DEXTROSE 50 % IV SOLN: 0.0000 mL | INTRAVENOUS | Status: DC | PRN

## 2023-11-10 MED ORDER — LEVOTHYROXINE SODIUM 50 MCG PO TABS
200.0000 ug | ORAL_TABLET | Freq: Every day | ORAL | Status: DC
  Administered 2023-11-11 – 2023-11-13 (×2): 200 ug via ORAL
  Filled 2023-11-10 (×4): qty 4

## 2023-11-10 MED ORDER — PANTOPRAZOLE SODIUM 40 MG PO TBEC
40.0000 mg | DELAYED_RELEASE_TABLET | Freq: Every day | ORAL | Status: DC
  Administered 2023-11-10 – 2023-11-12 (×3): 40 mg via ORAL
  Filled 2023-11-10 (×3): qty 1

## 2023-11-10 MED ORDER — ENOXAPARIN SODIUM 60 MG/0.6ML IJ SOSY
60.0000 mg | PREFILLED_SYRINGE | INTRAMUSCULAR | Status: DC
  Administered 2023-11-10 – 2023-11-12 (×3): 60 mg via SUBCUTANEOUS
  Filled 2023-11-10 (×3): qty 0.6

## 2023-11-10 MED ORDER — LACTATED RINGERS IV SOLN: INTRAVENOUS | Status: AC

## 2023-11-10 MED ORDER — ACETAMINOPHEN 325 MG PO TABS
650.0000 mg | ORAL_TABLET | Freq: Four times a day (QID) | ORAL | Status: DC | PRN
  Administered 2023-11-10 – 2023-11-12 (×5): 650 mg via ORAL
  Filled 2023-11-10 (×5): qty 2

## 2023-11-10 MED ORDER — LACTATED RINGERS IV SOLN: INTRAVENOUS | Status: DC

## 2023-11-10 MED ORDER — OXYCODONE HCL 5 MG PO TABS: 5.0000 mg | ORAL_TABLET | Freq: Four times a day (QID) | ORAL | Status: DC | PRN

## 2023-11-10 MED ORDER — ONDANSETRON HCL 4 MG/2ML IJ SOLN
4.0000 mg | Freq: Once | INTRAMUSCULAR | Status: AC
  Administered 2023-11-10: 4 mg via INTRAVENOUS
  Filled 2023-11-10: qty 2

## 2023-11-10 MED ORDER — LACTATED RINGERS IV BOLUS
500.0000 mL | Freq: Once | INTRAVENOUS | Status: AC
  Administered 2023-11-10: 500 mL via INTRAVENOUS

## 2023-11-10 MED ORDER — ROSUVASTATIN CALCIUM 20 MG PO TABS
40.0000 mg | ORAL_TABLET | Freq: Every day | ORAL | Status: DC
  Administered 2023-11-10 – 2023-11-12 (×3): 40 mg via ORAL
  Filled 2023-11-10 (×3): qty 2

## 2023-11-10 MED ORDER — DEXTROSE IN LACTATED RINGERS 5 % IV SOLN: INTRAVENOUS | Status: DC

## 2023-11-10 MED ORDER — VARENICLINE TARTRATE 0.5 MG PO TABS
1.0000 mg | ORAL_TABLET | Freq: Two times a day (BID) | ORAL | Status: DC
  Administered 2023-11-10 – 2023-11-12 (×2): 1 mg via ORAL
  Filled 2023-11-10 (×7): qty 2

## 2023-11-10 MED ORDER — INSULIN REGULAR(HUMAN) IN NACL 100-0.9 UT/100ML-% IV SOLN
INTRAVENOUS | Status: DC
  Administered 2023-11-10: 9.5 [IU]/h via INTRAVENOUS
  Administered 2023-11-11: 16 [IU]/h via INTRAVENOUS
  Administered 2023-11-11: 2.4 [IU]/h via INTRAVENOUS
  Filled 2023-11-10 (×2): qty 100

## 2023-11-10 MED ORDER — MORPHINE SULFATE (PF) 2 MG/ML IV SOLN: 2.0000 mg | INTRAVENOUS | Status: DC | PRN

## 2023-11-10 NOTE — ED Provider Notes (Signed)
 [facility] Provider Note    Event Date/Time   First MD Initiated Contact with Patient 11/10/23 1639     (approximate)   History   Hyperglycemia   HPI  Tanya Graves is a 58 y.o. female with history of diabetes who presents to the emergency department today because of concerns for elevated blood sugar.  She states she has been under a lot of stress recently but has been checking her blood sugars and they have been running around 125.  However this morning she noticed that her blood sugar went high.  She tried to bring it down with insulin  however was unsuccessful.  The patient did develop nausea with this. She has had DKA in the past. She denies any recent illness, no fevers, no dysuria.      Physical Exam   Triage Vital Signs: ED Triage Vitals  Encounter Vitals Group     BP 11/10/23 1635 (!) 80/50     Girls Systolic BP Percentile --      Girls Diastolic BP Percentile --      Boys Systolic BP Percentile --      Boys Diastolic BP Percentile --      Pulse Rate 11/10/23 1635 (!) 108     Resp 11/10/23 1635 (!) 23     Temp 11/10/23 1635 98.4 F (36.9 C)     Temp Source 11/10/23 1635 Oral     SpO2 11/10/23 1635 98 %     Weight --      Height 11/10/23 1631 5' 8 (1.727 m)     Head Circumference --      Peak Flow --      Pain Score 11/10/23 1630 5     Pain Loc --      Pain Education --      Exclude from Growth Chart --     Most recent vital signs: Vitals:   11/10/23 1635  BP: (!) 80/50  Pulse: (!) 108  Resp: (!) 23  Temp: 98.4 F (36.9 C)  SpO2: 98%   General: Awake, alert, oriented. CV:  Good peripheral perfusion. Tachycardia, regular rhythm. Resp:  Normal effort. Lungs clear. Abd:  No distention.    ED Results / Procedures / Treatments   Labs (all labs ordered are listed, but only abnormal results are displayed) Labs Reviewed  BASIC METABOLIC PANEL WITH GFR - Abnormal; Notable for the following components:      Result Value    Sodium 133 (*)    Potassium 5.9 (*)    CO2 9 (*)    Glucose, Bld 667 (*)    BUN 49 (*)    Creatinine, Ser 1.58 (*)    GFR, Estimated 38 (*)    Anion gap 25 (*)    All other components within normal limits  BETA-HYDROXYBUTYRIC ACID - Abnormal; Notable for the following components:   Beta-Hydroxybutyric Acid >8.00 (*)    All other components within normal limits  CBC WITH DIFFERENTIAL/PLATELET - Abnormal; Notable for the following components:   WBC 17.6 (*)    RBC 3.84 (*)    Neutro Abs 15.6 (*)    Abs Immature Granulocytes 0.18 (*)    All other components within normal limits  BLOOD GAS, VENOUS - Abnormal; Notable for the following components:   pH, Ven 7.16 (*)    pCO2, Ven 24 (*)    pO2, Ven 178 (*)    Bicarbonate 8.6 (*)    Acid-base deficit 18.4 (*)  All other components within normal limits  URINALYSIS, ROUTINE W REFLEX MICROSCOPIC - Abnormal; Notable for the following components:   Color, Urine YELLOW (*)    APPearance HAZY (*)    Glucose, UA >=500 (*)    Ketones, ur 20 (*)    Bacteria, UA RARE (*)    All other components within normal limits  BRAIN NATRIURETIC PEPTIDE - Abnormal; Notable for the following components:   B Natriuretic Peptide 326.4 (*)    All other components within normal limits  CBG MONITORING, ED - Abnormal; Notable for the following components:   Glucose-Capillary >600 (*)    All other components within normal limits  CBG MONITORING, ED - Abnormal; Notable for the following components:   Glucose-Capillary >600 (*)    All other components within normal limits  CBG MONITORING, ED - Abnormal; Notable for the following components:   Glucose-Capillary >600 (*)    All other components within normal limits  BASIC METABOLIC PANEL WITH GFR  BASIC METABOLIC PANEL WITH GFR  BASIC METABOLIC PANEL WITH GFR  BETA-HYDROXYBUTYRIC ACID  HIV ANTIBODY (ROUTINE TESTING W REFLEX)  BASIC METABOLIC PANEL WITH GFR  BASIC METABOLIC PANEL WITH GFR  BASIC METABOLIC  PANEL WITH GFR  BASIC METABOLIC PANEL WITH GFR  CBC     EKG  I, Marylynn Soho, attending physician, personally viewed and interpreted this EKG  EKG Time: 1633 Rate: 108 Rhythm: sinus tachycardia Axis: normal Intervals: qtc 472 QRS: narrow ST changes: no st elevation Impression: abnormal ekg   RADIOLOGY None   PROCEDURES:  Critical Care performed: Yes  CRITICAL CARE Performed by: Marylynn Soho   Total critical care time: 35 minutes  Critical care time was exclusive of separately billable procedures and treating other patients.  Critical care was necessary to treat or prevent imminent or life-threatening deterioration.  Critical care was time spent personally by me on the following activities: development of treatment plan with patient and/or surrogate as well as nursing, discussions with consultants, evaluation of patient's response to treatment, examination of patient, obtaining history from patient or surrogate, ordering and performing treatments and interventions, ordering and review of laboratory studies, ordering and review of radiographic studies, pulse oximetry and re-evaluation of patient's condition.   Procedures    MEDICATIONS ORDERED IN ED: Medications - No data to display   IMPRESSION / MDM / ASSESSMENT AND PLAN / ED COURSE  I reviewed the triage vital signs and the nursing notes.                              Differential diagnosis includes, but is not limited to, hyperglycemia, DKA, infection  Patient's presentation is most consistent with acute presentation with potential threat to life or bodily function.   The patient is on the cardiac monitor to evaluate for evidence of arrhythmia and/or significant heart rate changes.  Patient presented to the emergency department today because of concerns for elevated blood sugar as well as nausea.  On exam patient was found to be tachycardic.  Patient was afebrile.  CBG was noted to be significantly  elevated.  Blood work is consistent with DKA with elevated anion gap, acidosis and ketones.  I did discuss this finding with the patient.  Will start IV insulin  drip.  Discussed with Dr. Rosalea Collin with the hospitalist service who will evaluate for admission.      FINAL CLINICAL IMPRESSION(S) / ED DIAGNOSES   Final diagnoses:  Diabetic ketoacidosis without  coma associated with diabetes mellitus due to underlying condition [facility])       Note:  This document was prepared using Dragon voice recognition software and may include unintentional dictation errors.    Marylynn Soho, MD 11/10/23 2014

## 2023-11-10 NOTE — H&P (Signed)
 History and Physical    Tanya Graves:[id number] DOB: 1965-11-09 DOA: 11/10/2023  Referring MD/NP/PA:   PCP: Judithann Novas, MD   Patient coming from:  The patient is coming from home.     Chief Complaint: Elevated blood sugar level, abdominal pain  HPI: Tanya Graves is a 58 y.o. female with medical history significant of type 1 diabetes on insulin  pump, HLD, dCHF, hypothyroidism, OSA on CPAP, obesity, former smoker, who presents with elevated blood sugar, abdominal pain.  Patient states that her blood sugar has been elevated in the past several days, up to more than 500.  She has polydipsia.  She developed nausea vomiting and abdominal pain today, with 8 times of nonbilious nonbloody vomiting.  No diarrhea.  Her abdominal pain is located in the upper abdomen, constant, aching, moderate, nonradiating, not aggravated or alleviated by any known factors.  No fever or chills.  Denies dysuria or burning on urination.  No chest pain, cough, SOB.   Data reviewed independently and ED Course: pt was found to have DKA (blood sugar 667, bicarbonate of 9, anion gap 25, beta hydroxybutyric acid> 8, pH 7.16 by VBG), WBC 17.6, BNP  326, AKI with creatinine 1.58, BUN 49 and GFR 38 (recent baseline creatinine 0.91 on 04/07/2023), potassium 5.9.  Temperature normal, initial blood pressure 80/50 which improved to 122/59 after giving 2L of LR bolus, heart rate 100-120s, RR 23, oxygen saturation 100% on room air.  Patient is placed in stepdown for observation.   EKG: I have personally reviewed.  Sinus rhythm, QTc 472, heart rate 108, nonspecific T wave change   Review of Systems:   General: no fevers, chills, no body weight gain, has poor appetite, has fatigue HEENT: no blurry vision, hearing changes or sore throat Respiratory: no dyspnea, coughing, wheezing CV: no chest pain, no palpitations GI: has nausea, vomiting, abdominal pain, no diarrhea, constipation GU: no dysuria, burning on urination,  increased urinary frequency, hematuria  Ext: no leg edema Neuro: no unilateral weakness, numbness, or tingling, no vision change or hearing loss Skin: no rash, no skin tear. MSK: No muscle spasm, no deformity, no limitation of range of movement in spin Heme: No easy bruising.  Travel history: No recent long distant travel.   Allergy: No Known Allergies  Past Medical History:  Diagnosis Date   Arthritis    Diabetes mellitus without complication (HCC)    type 2   Thyroid  disease     Past Surgical History:  Procedure Laterality Date   APPENDECTOMY  1993   CALCANEAL OSTEOTOMY Left 07/17/2016   Procedure: CALCANEAL OSTEOTOMY;  Surgeon: Marlynn Singer, MD;  Location: [facility] ORS;  Service: Orthopedics;  Laterality: Left;   COLONOSCOPY  12/18/2018   found 1 3mm polyp, going back in 5 years    COLONOSCOPY WITH PROPOFOL  N/A 12/18/2018   Procedure: COLONOSCOPY WITH PROPOFOL ;  Surgeon: Luke Salaam, MD;  Location: [facility];  Service: Gastroenterology;  Laterality: N/A;   IRRIGATION AND DEBRIDEMENT ABSCESS N/A 01/24/2015   Procedure: IRRIGATION AND DEBRIDEMENT ABSCESS/GROIN ABSCESS;  Surgeon: Scherry Curtis, MD;  Location: [facility] ORS;  Service: General;  Laterality: N/A;   KNEE ARTHROSCOPY W/ MENISCAL REPAIR Right 2008   ROTATOR CUFF REPAIR Right 2015   TONSILLECTOMY  1998    Social History:  reports that she has quit smoking. Her smoking use included cigarettes. She started smoking about 18 months ago. She has a 7.7 pack-year smoking history. She has never used smokeless tobacco. She reports that she  does not currently use alcohol. She reports that she does not use drugs.  Family History:  Family History  Problem Relation Age of Onset   Diabetes Father    Heart failure Father    Diabetes Brother    Arthritis Mother    Diabetes Mother    Transient ischemic attack Mother    Heart failure Maternal Grandmother    Alcohol abuse Maternal Grandmother    Diabetes Paternal Grandmother     Kidney failure Paternal Grandmother    Heart failure Paternal Grandfather    Cancer Paternal Grandfather        unknown type   Hypertension Brother    Breast cancer Maternal Great-grandmother      Prior to Admission medications   Medication Sig Start Date End Date Taking? Authorizing Provider  albuterol  (VENTOLIN  HFA) 108 (90 Base) MCG/ACT inhaler INHALE 2 PUFFS BY MOUTH EVERY 6 HOURS AS NEEDED FOR WHEEZING OR SHORTNESS OF BREATH 10/03/23   Bedsole, Amy E, MD  BAQSIMI  ONE PACK 3 MG/DOSE POWD PLACE 1 DEVICE INTO THE NOSE AS DIRECTED 07/14/23   Shamleffer, Julian Obey, MD  Continuous Glucose Sensor (DEXCOM G7 SENSOR) MISC 1 Device by Does not apply route as directed. 04/15/23   Shamleffer, Ibtehal Jaralla, MD  estradiol  (ESTRACE ) 0.1 MG/GM vaginal cream PLACE 1/4 APPLICATORFUL VAGINALLY AT BEDTIME 10/03/23   Zenobia Hila, MD  glucose blood (CONTOUR NEXT TEST) test strip USE TO TEST BLOOD SUGAR 6 TIMES A DAY 01/10/23   Shamleffer, Ibtehal Jaralla, MD  insulin  aspart (NOVOLOG ) 100 UNIT/ML injection INJECT A MAXIMUM OF 60 UNITS UNDER THE SKIN DAILY 10/03/23   Shamleffer, Ibtehal Jaralla, MD  Insulin  Disposable Pump (OMNIPOD 5 G7 PODS, GEN 5,) MISC 1 Device by Does not apply route every other day. 04/15/23   Shamleffer, Ibtehal Jaralla, MD  levothyroxine  (SYNTHROID ) 200 MCG tablet Take 1 tablet (200 mcg total) by mouth daily. 04/16/23   Shamleffer, Ibtehal Jaralla, MD  meloxicam  (MOBIC ) 15 MG tablet TAKE 1 TABLET BY MOUTH DAILY 11/07/23   Dot Gazella, DPM  rosuvastatin  (CRESTOR ) 40 MG tablet TAKE 1 TABLET BY MOUTH DAILY 07/14/23   Shamleffer, Ibtehal Jaralla, MD  tirzepatide  (MOUNJARO ) 7.5 MG/0.5ML Pen Inject 7.5 mg into the skin once a week. 07/01/23   Shamleffer, Ibtehal Jaralla, MD  varenicline  (CHANTIX  CONTINUING MONTH PAK) 1 MG tablet Take 1 tablet (1 mg total) by mouth 2 (two) times daily. 04/15/23   Judithann Novas, MD    Physical Exam: Vitals:   11/10/23 1730 11/10/23 1935 11/10/23  2030 11/10/23 2130  BP: (!) 122/59  (!) 109/55   Pulse: (!) 122  (!) 120   Resp:   19   Temp:    98.6 F (37 C)  TempSrc:    Oral  SpO2: 100%  100%   Weight:  116.9 kg    Height:       General: Not in acute distress.  Dry mucous membrane HEENT:       Eyes: PERRL, EOMI, no jaundice       ENT: No discharge from the ears and nose, no pharynx injection, no tonsillar enlargement.        Neck: No JVD, no bruit, no mass felt. Heme: No neck lymph node enlargement. Cardiac: S1/S2, RRR, No murmurs, No gallops or rubs. Respiratory: No rales, wheezing, rhonchi or rubs. GI: Soft, nondistended, has epigastric abdominal tenderness, no rebound pain, no organomegaly, BS present. GU: No hematuria Ext: No pitting leg edema bilaterally. 1+DP/PT pulse  bilaterally. Musculoskeletal: No joint deformities, No joint redness or warmth, no limitation of ROM in spin. Skin: No rashes.  Neuro: Alert, oriented X3, cranial nerves II-XII grossly intact, moves all extremities normally.  Psych: Patient is not psychotic, no suicidal or hemocidal ideation.  Labs on Admission: I have personally reviewed following labs and imaging studies  CBC: Recent Labs  Lab 11/10/23 1717  WBC 17.6*  NEUTROABS 15.6*  HGB 12.3  HCT 37.9  MCV 98.7  PLT 220   Basic Metabolic Panel: Recent Labs  Lab 11/10/23 1717 11/10/23 2016  NA 133* 134*  K 5.9* 5.2*  CL 99 102  CO2 9* <7*  GLUCOSE 667* 600*  BUN 49* 50*  CREATININE 1.58* 1.67*  CALCIUM  9.0 8.1*   GFR: Estimated Creatinine Clearance: 50.5 mL/min (A) (by C-G formula based on SCr of 1.67 mg/dL (H)). Liver Function Tests: No results for input(s): AST, ALT, ALKPHOS, BILITOT, PROT, ALBUMIN in the last 168 hours. No results for input(s): LIPASE, AMYLASE in the last 168 hours. No results for input(s): AMMONIA in the last 168 hours. Coagulation Profile: No results for input(s): INR, PROTIME in the last 168 hours. Cardiac Enzymes: No results for  input(s): CKTOTAL, CKMB, CKMBINDEX, TROPONINI in the last 168 hours. BNP (last 3 results) No results for input(s): PROBNP in the last 8760 hours. HbA1C: No results for input(s): HGBA1C in the last 72 hours. CBG: Recent Labs  Lab 11/10/23 1636 11/10/23 1843 11/10/23 2000 11/10/23 2122  GLUCAP >600* >600* >600* 517*   Lipid Profile: No results for input(s): CHOL, HDL, LDLCALC, TRIG, CHOLHDL, LDLDIRECT in the last 72 hours. Thyroid  Function Tests: No results for input(s): TSH, T4TOTAL, FREET4, T3FREE, THYROIDAB in the last 72 hours. Anemia Panel: No results for input(s): VITAMINB12, FOLATE, FERRITIN, TIBC, IRON, RETICCTPCT in the last 72 hours. Urine analysis:    Component Value Date/Time   COLORURINE YELLOW (A) 11/10/2023 1718   APPEARANCEUR HAZY (A) 11/10/2023 1718   APPEARANCEUR Clear 02/12/2014 2210   LABSPEC 1.018 11/10/2023 1718   LABSPEC 1.026 02/12/2014 2210   PHURINE 5.0 11/10/2023 1718   GLUCOSEU >=500 (A) 11/10/2023 1718   GLUCOSEU >=500 02/12/2014 2210   HGBUR NEGATIVE 11/10/2023 1718   BILIRUBINUR NEGATIVE 11/10/2023 1718   BILIRUBINUR small 03/31/2020 1125   BILIRUBINUR Negative 02/12/2014 2210   KETONESUR 20 (A) 11/10/2023 1718   PROTEINUR NEGATIVE 11/10/2023 1718   UROBILINOGEN negative (A) 03/31/2020 1125   NITRITE NEGATIVE 11/10/2023 1718   LEUKOCYTESUR NEGATIVE 11/10/2023 1718   LEUKOCYTESUR Negative 02/12/2014 2210   Sepsis Labs: @LABRCNTIP (procalcitonin:4,lacticidven:4) )No results found for this or any previous visit (from the past 240 hours).   Radiological Exams on Admission:   Assessment/Plan Active Problems:   DKA (diabetic ketoacidosis) (HCC)   Type 1 diabetes mellitus with hyperglycemia (HCC)   Hypercholesteremia   Hypothyroidism   Chronic diastolic CHF (congestive heart failure) (HCC)   Leukocytosis   AKI (acute kidney injury) (HCC)   Abdominal pain   Obesity (BMI 30-39.9)    Hyperkalemia   Assessment and Plan:  DKA (diabetic ketoacidosis) (HCC): Blood sugar 667, bicarbonate of 9, anion gap 25, beta hydroxybutyric acid> 8, pH 7.16 by VBG.  Mental status normal.  - Place in SDU for obs - IVF:  2.5 L of LR bolus - start insulin  drip with BMP q4h - IVF: LR at 125 cc/h, will switch to D5-LR at 125 cc/h when CBG<250 - replete K as needed - Zofran  prn nausea  - NPO  - consult to  diabetic educator  Type 1 diabetes mellitus with hyperglycemia (HCC): Recent A1c 7.5, poorly controlled.  Patient is using insulin  pump. - On DKA protocol now  Hypercholesteremia -Crestor   Hypothyroidism -Synthroid   Chronic diastolic CHF (congestive heart failure) (HCC): 2D echo on 05/11/2022 showed EF of 60 to 65% with grade 1 diastolic dysfunction.  BNP is mildly elevated at 326, but clinically patient looks dry, no shortness of breath or oxygen desaturation, does not seem to have CHF exacerbation. -Watch volume status closely  Leukocytosis: WBC 17.6, no source of infection identified, likely due to DKA. -Follow CBC  AKI (acute kidney injury) (HCC): Likely due to dehydration and continuation of Mobic . - IV fluid as above - Hold Mobic   Abdominal pain: Patient nausea vomiting, epigastric abdominal pain.  Patient is taking Mobic  at home.  Likely due to multifactorial etiology, including DKA and Mobic  side effects.  Abdominal is soft, no acute abdominal examination. -As needed morphine , oxycodone  for pain - Check lipase - Check liver function - Started Protonix  40 g daily empirically  Obesity (BMI 30-39.9): Patient has Obesity Class II, with body weight 116.9 Kg and BMI 39/18 kg/m2.  - Encourage losing weight - Exercise and healthy diet  Hypokalemia: Potassium 5.9, due to DKA -Expecting correction with insulin  and IV fluid      DVT ppx: SQ Lovenox   Code Status: Full code     Family Communication:     not done, no family member is at bed side.       Disposition  Plan:  Anticipate discharge back to previous environment  Consults called:  none  Admission status and Level of care: Stepdown:    for obs    Dispo: The patient is from: Home              Anticipated d/c is to: Home              Anticipated d/c date is: 1 day              Patient currently is not medically stable to d/c.    Severity of Illness:  The appropriate patient status for this patient is OBSERVATION. Observation status is judged to be reasonable and necessary in order to provide the required intensity of service to ensure the patient's safety. The patient's presenting symptoms, physical exam findings, and initial radiographic and laboratory data in the context of their medical condition is felt to place them at decreased risk for further clinical deterioration. Furthermore, it is anticipated that the patient will be medically stable for discharge from the hospital within 2 midnights of admission.        Date of Service 11/10/2023    Fidencio Hue [facility]   If 7PM-7AM, please contact night-coverage [email or web address] 11/10/2023, 10:10 PM

## 2023-11-10 NOTE — Progress Notes (Signed)
 PHARMACIST - PHYSICIAN COMMUNICATION  CONCERNING:  Enoxaparin  (Lovenox ) for DVT Prophylaxis   ASSESSMENT: Patient was prescribed enoxaparin  40 mg subcutaneously every 24 hours for VTE prophylaxis.   Body mass index is 39.18 kg/m.  Estimated Creatinine Clearance: 53.4 mL/min (A) (by C-G formula based on SCr of 1.58 mg/dL (H)).  Based on [facility] policy, patient qualifies for enoxaparin  dosing of 0.5 mg per kilogram of total body weight every 24 hours because their body mass index is >30 kg/m2.  PLAN: Pharmacy has adjusted enoxaparin  dose per [facility] policy.  Description: Patient is now receiving enoxaparin  60 mg subcutaneously every 24 hours.  Will M. Alva Jewels, PharmD Clinical Pharmacist 11/10/2023 7:42 PM

## 2023-11-10 NOTE — ED Triage Notes (Signed)
 Pt arrives via [facility] from home for hyperglycemia. Pts monitor started reading high at 11 am today and pt got blood sugar down to 400s. EMS checked CBG and it was 575. EMS gave 500 cc of fluids and 4 mg of zofran   EMS vitals: Low 100s HR Mid 20s RR

## 2023-11-11 DIAGNOSIS — E039 Hypothyroidism, unspecified: Secondary | ICD-10-CM | POA: Diagnosis present

## 2023-11-11 DIAGNOSIS — I5032 Chronic diastolic (congestive) heart failure: Secondary | ICD-10-CM | POA: Diagnosis present

## 2023-11-11 DIAGNOSIS — N179 Acute kidney failure, unspecified: Secondary | ICD-10-CM | POA: Diagnosis present

## 2023-11-11 DIAGNOSIS — Z9641 Presence of insulin pump (external) (internal): Secondary | ICD-10-CM | POA: Diagnosis present

## 2023-11-11 DIAGNOSIS — E66813 Obesity, class 3: Secondary | ICD-10-CM | POA: Diagnosis present

## 2023-11-11 DIAGNOSIS — Z79890 Hormone replacement therapy (postmenopausal): Secondary | ICD-10-CM | POA: Diagnosis not present

## 2023-11-11 DIAGNOSIS — D72829 Elevated white blood cell count, unspecified: Secondary | ICD-10-CM | POA: Diagnosis present

## 2023-11-11 DIAGNOSIS — E1010 Type 1 diabetes mellitus with ketoacidosis without coma: Secondary | ICD-10-CM | POA: Diagnosis present

## 2023-11-11 DIAGNOSIS — Z794 Long term (current) use of insulin: Secondary | ICD-10-CM | POA: Diagnosis not present

## 2023-11-11 DIAGNOSIS — Z6841 Body Mass Index (BMI) 40.0 and over, adult: Secondary | ICD-10-CM | POA: Diagnosis not present

## 2023-11-11 DIAGNOSIS — D509 Iron deficiency anemia, unspecified: Secondary | ICD-10-CM | POA: Diagnosis present

## 2023-11-11 DIAGNOSIS — Z8249 Family history of ischemic heart disease and other diseases of the circulatory system: Secondary | ICD-10-CM | POA: Diagnosis not present

## 2023-11-11 DIAGNOSIS — T39395A Adverse effect of other nonsteroidal anti-inflammatory drugs [NSAID], initial encounter: Secondary | ICD-10-CM | POA: Diagnosis present

## 2023-11-11 DIAGNOSIS — Z833 Family history of diabetes mellitus: Secondary | ICD-10-CM | POA: Diagnosis not present

## 2023-11-11 DIAGNOSIS — E876 Hypokalemia: Secondary | ICD-10-CM | POA: Diagnosis present

## 2023-11-11 DIAGNOSIS — E860 Dehydration: Secondary | ICD-10-CM | POA: Diagnosis present

## 2023-11-11 DIAGNOSIS — E559 Vitamin D deficiency, unspecified: Secondary | ICD-10-CM | POA: Diagnosis present

## 2023-11-11 DIAGNOSIS — Z87891 Personal history of nicotine dependence: Secondary | ICD-10-CM | POA: Diagnosis not present

## 2023-11-11 DIAGNOSIS — Z8261 Family history of arthritis: Secondary | ICD-10-CM | POA: Diagnosis not present

## 2023-11-11 DIAGNOSIS — R739 Hyperglycemia, unspecified: Secondary | ICD-10-CM | POA: Diagnosis present

## 2023-11-11 DIAGNOSIS — Z803 Family history of malignant neoplasm of breast: Secondary | ICD-10-CM | POA: Diagnosis not present

## 2023-11-11 DIAGNOSIS — I9589 Other hypotension: Secondary | ICD-10-CM | POA: Diagnosis present

## 2023-11-11 DIAGNOSIS — E7800 Pure hypercholesterolemia, unspecified: Secondary | ICD-10-CM | POA: Diagnosis present

## 2023-11-11 DIAGNOSIS — Z7985 Long-term (current) use of injectable non-insulin antidiabetic drugs: Secondary | ICD-10-CM | POA: Diagnosis not present

## 2023-11-11 DIAGNOSIS — E875 Hyperkalemia: Secondary | ICD-10-CM | POA: Diagnosis present

## 2023-11-11 LAB — CBG MONITORING, ED
Glucose-Capillary: 445 mg/dL — ABNORMAL HIGH (ref 70–99)
Glucose-Capillary: 373 mg/dL — ABNORMAL HIGH (ref 70–99)
Glucose-Capillary: 310 mg/dL — ABNORMAL HIGH (ref 70–99)
Glucose-Capillary: 284 mg/dL — ABNORMAL HIGH (ref 70–99)
Glucose-Capillary: 223 mg/dL — ABNORMAL HIGH (ref 70–99)

## 2023-11-11 LAB — BASIC METABOLIC PANEL WITH GFR
Anion gap: 11 (ref 5–15)
Anion gap: 16 — ABNORMAL HIGH (ref 5–15)
Anion gap: 7 (ref 5–15)
Anion gap: 8 (ref 5–15)
Anion gap: 8 (ref 5–15)
Anion gap: 9 (ref 5–15)
BUN: 52 mg/dL — ABNORMAL HIGH (ref 6–20)
BUN: 56 mg/dL — ABNORMAL HIGH (ref 6–20)
BUN: 57 mg/dL — ABNORMAL HIGH (ref 6–20)
BUN: 59 mg/dL — ABNORMAL HIGH (ref 6–20)
BUN: 60 mg/dL — ABNORMAL HIGH (ref 6–20)
BUN: 60 mg/dL — ABNORMAL HIGH (ref 6–20)
CO2: 15 mmol/L — ABNORMAL LOW (ref 22–32)
CO2: 17 mmol/L — ABNORMAL LOW (ref 22–32)
CO2: 19 mmol/L — ABNORMAL LOW (ref 22–32)
CO2: 22 mmol/L (ref 22–32)
CO2: 23 mmol/L (ref 22–32)
CO2: 24 mmol/L (ref 22–32)
Calcium: 6.9 mg/dL — ABNORMAL LOW (ref 8.9–10.3)
Calcium: 8.3 mg/dL — ABNORMAL LOW (ref 8.9–10.3)
Calcium: 8.3 mg/dL — ABNORMAL LOW (ref 8.9–10.3)
Calcium: 8.5 mg/dL — ABNORMAL LOW (ref 8.9–10.3)
Calcium: 8.5 mg/dL — ABNORMAL LOW (ref 8.9–10.3)
Calcium: 8.6 mg/dL — ABNORMAL LOW (ref 8.9–10.3)
Chloride: 103 mmol/L (ref 98–111)
Chloride: 104 mmol/L (ref 98–111)
Chloride: 106 mmol/L (ref 98–111)
Chloride: 106 mmol/L (ref 98–111)
Chloride: 106 mmol/L (ref 98–111)
Chloride: 113 mmol/L — ABNORMAL HIGH (ref 98–111)
Creatinine, Ser: 1.26 mg/dL — ABNORMAL HIGH (ref 0.44–1.00)
Creatinine, Ser: 1.32 mg/dL — ABNORMAL HIGH (ref 0.44–1.00)
Creatinine, Ser: 1.46 mg/dL — ABNORMAL HIGH (ref 0.44–1.00)
Creatinine, Ser: 1.48 mg/dL — ABNORMAL HIGH (ref 0.44–1.00)
Creatinine, Ser: 1.62 mg/dL — ABNORMAL HIGH (ref 0.44–1.00)
Creatinine, Ser: 1.69 mg/dL — ABNORMAL HIGH (ref 0.44–1.00)
GFR, Estimated: 35 mL/min — ABNORMAL LOW (ref >60)
GFR, Estimated: 37 mL/min — ABNORMAL LOW (ref >60)
GFR, Estimated: 41 mL/min — ABNORMAL LOW (ref >60)
GFR, Estimated: 42 mL/min — ABNORMAL LOW (ref >60)
GFR, Estimated: 47 mL/min — ABNORMAL LOW (ref >60)
GFR, Estimated: 50 mL/min — ABNORMAL LOW (ref >60)
Glucose, Bld: 164 mg/dL — ABNORMAL HIGH (ref 70–99)
Glucose, Bld: 205 mg/dL — ABNORMAL HIGH (ref 70–99)
Glucose, Bld: 295 mg/dL — ABNORMAL HIGH (ref 70–99)
Glucose, Bld: 320 mg/dL — ABNORMAL HIGH (ref 70–99)
Glucose, Bld: 449 mg/dL — ABNORMAL HIGH (ref 70–99)
Glucose, Bld: 502 mg/dL (ref 70–99)
Potassium: 3.5 mmol/L (ref 3.5–5.1)
Potassium: 3.8 mmol/L (ref 3.5–5.1)
Potassium: 3.9 mmol/L (ref 3.5–5.1)
Potassium: 4 mmol/L (ref 3.5–5.1)
Potassium: 4.1 mmol/L (ref 3.5–5.1)
Potassium: 4.3 mmol/L (ref 3.5–5.1)
Sodium: 134 mmol/L — ABNORMAL LOW (ref 135–145)
Sodium: 134 mmol/L — ABNORMAL LOW (ref 135–145)
Sodium: 137 mmol/L (ref 135–145)
Sodium: 137 mmol/L (ref 135–145)
Sodium: 137 mmol/L (ref 135–145)
Sodium: 138 mmol/L (ref 135–145)

## 2023-11-11 LAB — MAGNESIUM: Magnesium: 2 mg/dL (ref 1.7–2.4)

## 2023-11-11 LAB — GLUCOSE, CAPILLARY
Glucose-Capillary: 156 mg/dL — ABNORMAL HIGH (ref 70–99)
Glucose-Capillary: 157 mg/dL — ABNORMAL HIGH (ref 70–99)
Glucose-Capillary: 140 mg/dL — ABNORMAL HIGH (ref 70–99)
Glucose-Capillary: 323 mg/dL — ABNORMAL HIGH (ref 70–99)
Glucose-Capillary: 331 mg/dL — ABNORMAL HIGH (ref 70–99)

## 2023-11-11 LAB — CBC
HCT: 30 % — ABNORMAL LOW (ref 36.0–46.0)
Hemoglobin: 10.6 g/dL — ABNORMAL LOW (ref 12.0–15.0)
MCH: 32 pg (ref 26.0–34.0)
MCHC: 35.3 g/dL (ref 30.0–36.0)
MCV: 90.6 fL (ref 80.0–100.0)
Platelets: 223 10*3/uL (ref 150–400)
RBC: 3.31 MIL/uL — ABNORMAL LOW (ref 3.87–5.11)
RDW: 12.4 % (ref 11.5–15.5)
WBC: 18.6 10*3/uL — ABNORMAL HIGH (ref 4.0–10.5)
nRBC: 0 % (ref 0.0–0.2)

## 2023-11-11 LAB — FOLATE: Folate: 20.4 ng/mL (ref >5.9)

## 2023-11-11 LAB — VITAMIN B12: Vitamin B-12: 457 pg/mL (ref 180–914)

## 2023-11-11 LAB — HEPATIC FUNCTION PANEL
ALT: 23 U/L (ref 0–44)
AST: 34 U/L (ref 15–41)
Albumin: 3.4 g/dL — ABNORMAL LOW (ref 3.5–5.0)
Alkaline Phosphatase: 71 U/L (ref 38–126)
Bilirubin, Direct: <0.1 mg/dL (ref 0.0–0.2)
Total Bilirubin: 1.6 mg/dL — ABNORMAL HIGH (ref 0.0–1.2)
Total Protein: 5.8 g/dL — ABNORMAL LOW (ref 6.5–8.1)

## 2023-11-11 LAB — HIV ANTIBODY (ROUTINE TESTING W REFLEX): HIV Screen 4th Generation wRfx: NONREACTIVE

## 2023-11-11 LAB — LIPASE, BLOOD: Lipase: 67 U/L — ABNORMAL HIGH (ref 11–51)

## 2023-11-11 LAB — IRON AND TIBC
Iron: 30 ug/dL (ref 28–170)
Saturation Ratios: 10 % — ABNORMAL LOW (ref 10.4–31.8)
TIBC: 291 ug/dL (ref 250–450)
UIBC: 261 ug/dL

## 2023-11-11 LAB — PHOSPHORUS: Phosphorus: 3.5 mg/dL (ref 2.5–4.6)

## 2023-11-11 LAB — VITAMIN D 25 HYDROXY (VIT D DEFICIENCY, FRACTURES): Vit D, 25-Hydroxy: 18.77 ng/mL — ABNORMAL LOW (ref 30–100)

## 2023-11-11 LAB — MRSA NEXT GEN BY PCR, NASAL: MRSA by PCR Next Gen: NOT DETECTED

## 2023-11-11 MED ORDER — LACTATED RINGERS IV BOLUS
1000.0000 mL | Freq: Once | INTRAVENOUS | Status: AC
  Administered 2023-11-11: 1000 mL via INTRAVENOUS

## 2023-11-11 MED ORDER — VITAMIN C 500 MG PO TABS
500.0000 mg | ORAL_TABLET | Freq: Every day | ORAL | Status: DC
  Administered 2023-11-11 – 2023-11-13 (×3): 500 mg via ORAL
  Filled 2023-11-11 (×3): qty 1

## 2023-11-11 MED ORDER — POLYSACCHARIDE IRON COMPLEX 150 MG PO CAPS
150.0000 mg | ORAL_CAPSULE | Freq: Every day | ORAL | Status: DC
  Administered 2023-11-11 – 2023-11-13 (×3): 150 mg via ORAL
  Filled 2023-11-11 (×3): qty 1

## 2023-11-11 MED ORDER — MIDODRINE HCL 5 MG PO TABS
5.0000 mg | ORAL_TABLET | Freq: Three times a day (TID) | ORAL | Status: DC
  Administered 2023-11-11 – 2023-11-12 (×6): 5 mg via ORAL
  Filled 2023-11-11 (×6): qty 1

## 2023-11-11 MED ORDER — INSULIN ASPART 100 UNIT/ML IJ SOLN
5.0000 [IU] | Freq: Three times a day (TID) | INTRAMUSCULAR | Status: DC
  Administered 2023-11-11: 5 [IU] via SUBCUTANEOUS
  Filled 2023-11-11 (×2): qty 1

## 2023-11-11 MED ORDER — INSULIN GLARGINE-YFGN 100 UNIT/ML ~~LOC~~ SOLN
17.0000 [IU] | SUBCUTANEOUS | Status: DC
  Filled 2023-11-11: qty 0.17

## 2023-11-11 MED ORDER — INSULIN ASPART 100 UNIT/ML IJ SOLN
0.0000 [IU] | Freq: Three times a day (TID) | INTRAMUSCULAR | Status: DC
  Administered 2023-11-11: 2 [IU] via SUBCUTANEOUS
  Filled 2023-11-11: qty 1

## 2023-11-11 MED ORDER — INSULIN GLARGINE-YFGN 100 UNIT/ML ~~LOC~~ SOLN
10.0000 [IU] | SUBCUTANEOUS | Status: DC
Start: 2023-11-11 — End: 2023-11-11
  Administered 2023-11-11: 10 [IU] via SUBCUTANEOUS
  Filled 2023-11-11: qty 0.1

## 2023-11-11 MED ORDER — MIDODRINE HCL 5 MG PO TABS: 5.0000 mg | ORAL_TABLET | Freq: Three times a day (TID) | ORAL | Status: DC

## 2023-11-11 MED ORDER — INSULIN ASPART 100 UNIT/ML IJ SOLN
0.0000 [IU] | Freq: Three times a day (TID) | INTRAMUSCULAR | Status: DC
  Administered 2023-11-11: 7 [IU] via SUBCUTANEOUS
  Administered 2023-11-12 (×2): 9 [IU] via SUBCUTANEOUS
  Filled 2023-11-11 (×3): qty 1

## 2023-11-11 NOTE — ED Notes (Signed)
Informed RN bed assigned 

## 2023-11-11 NOTE — ED Notes (Addendum)
 This Rn came in the AM and took over pt from Hillary Lowing, Charity fundraiser. This RN informed that pt's glucose in 223, this rn switched pt's fluids over to lactated ringers  and dextrose . This Rn message MD Hubert Madden for pt to be transitioned per order parameters, as pt's co2 is normal. This Rn told by MD to do what endotool says. After putting information into endotool, Rn directed to change rate to 9.5. Rate changed by RN.  Pt to remain on endotool protocol per MD Hubert Madden.

## 2023-11-11 NOTE — ED Notes (Addendum)
 NP Ouma messaged through secure chat: Hello, this pts BP has been low. In total she has gotten 2500 mL of fluids but it has not done much for her BP. She is here for DKA. Her most recent BP at 0100 was 87/53 with a MAP of 64. Is there anything you want me to give?  NP Ouma responded: give another bolus LR dka can get upto 7L

## 2023-11-11 NOTE — Inpatient Diabetes Management (Addendum)
 Inpatient Diabetes Program Recommendations  AACE/ADA: New Consensus Statement on Inpatient Glycemic Control  Target Ranges:  Prepandial:   less than 140 mg/dL      Peak postprandial:   less than 180 mg/dL (1-2 hours)      Critically ill patients:  140 - 180 mg/dL    Latest Reference Range & Units 11/11/23 01:26 11/11/23 02:45 11/11/23 04:14 11/11/23 05:19 11/11/23 06:26  Glucose-Capillary 70 - 99 mg/dL 409 (H) 811 (H) 914 (H) 284 (H) 223 (H)    Latest Reference Range & Units 11/10/23 17:17  CO2 22 - 32 mmol/L 9 (L)  Glucose 70 - 99 mg/dL 782 (HH)  BUN 6 - 20 mg/dL 49 (H)  Creatinine 9.56 - 1.00 mg/dL 2.13 (H)  Calcium  8.9 - 10.3 mg/dL 9.0  Anion gap 5 - 15  25 (H)    Latest Reference Range & Units 11/10/23 17:17  Beta-Hydroxybutyric Acid 0.05 - 0.27 mmol/L >8.00 (H)   Review of Glycemic Control  Diabetes history: DM1 Outpatient Diabetes medications: OmniPod insulin  pump with Novolog  (basal 2.0 units/hr (total 48 units per day), I:CR 1:1 (enters 12 grams with meals, 6 grams with snacks), I:SF 1:35 mg/dl), Mounjaro  7.5 mg Qweek; Dexcom G7 Current orders for Inpatient glycemic control: IV insulin   Inpatient Diabetes Program Recommendations:    Insulin : Once MD is ready to transition from IV to SQ insulin , please consider ordering Semglee  17 units Q24H, CBGs Q4H, Novlog 0-9 units Q4H, and Novolog  5 units TID with meals for meal coverage if patient eats at least 50% of meals.  Outpatient DM: Patient would like to wait and resume her insulin  pump after discharge. Will need to hold/stop basal insulin  on day of discharge to prevent overlap of SQ basal and hourly basal from pump.  Encouraged patient to reach out to her Endocrinologist to clarify what basal rate on pump needs to be (currently 2.0 units/hour and per Endocrinology note it should be 0.7 units/hr).  NOTE: Patient admitted with DKA, has hx of DM1 and uses an insulin  pump for DM control as an outpatient. Per chart, patient sees Dr.  Rosalea Collin (Endocrinologist) and was last seen on 07/22/23. Per office note on 07/22/23 patient is prescribed  Mounjaro  7.5 mg weekly NovoLog      Pump   Omnipod Settings   Insulin  type   Novolog     Basal rate          0000 0.70 u/h                        I:C ratio          0000 1:1      #12 g TID       # 6 g with snacks                Sensitivity          0000  30         Goal          0000  120       Patient is currently on IV insulin  per DKA order set.   Addendum 11/11/23@12 :15-Spoke with patient at bedside regarding DM. Patient states that she uses OmniPod 5 and Dexcom G7; she uses her OmniPod phone app and Dexcom G7 app. Patient reports that she does not feel that her OmniPod was malfunctioning, she feels that stress and personal situations caused her glucose to go up and she could not get them down. She  reports that she tried to do multiple boluses on her pump but glucose was not responding and once she vomited 3 times, she felt she needed to come to the hospital. Discussed that if glucose does not respond to bolus via pump, she may need to take SQ insulin  injection for persistent hyperglycemia. Discussed at times, may need to change out the OmniPod pod as there may be an issue with it if glucose is not coming down with pump.  Patient reports she removed prior OmniPod pod and Dexcom G7 sensor yesterday in the ED. Patient has extra pump supplies and Dexcom G7 sensor at bedside and she would like to wait and restart her pump after she is discharged. Reviewed insulin  pump settings in OmniPod app which are as follows:  Basal 12A  2.0 units/hr Total Basal 48 units/day Insulin  to Carb Ratio - 1:1 gram (she enters 12 grams with all meals and 6 grams with snacks) Insulin  Sensitivity- 1:35 mg/dl Target Glucose: 161 mg/dl   Patient reports she uses auto mode most of the time but she notes that she does get kicked into manual mode often. Discussed that per Dr. Eleonore Grill note on  07/22/23, her basal rate should be 0.7 units/hr (total of 16.8 units per day); however it is set at 2.0 units/hr (total of 48 units per day). Patient states she does not know how to change any of the settings and she is not sure why it would be different than noted in Dr. Eleonore Grill note. Patient states that she has hypoglycemia sometimes (mostly during the night) and she keeps Glucose SOS powder on bedside table to use to treat lows during the night. Encouraged patient to call Dr. Eleonore Grill office to ask what basal rate in pump needs to be and to have them assist her to make the change if needed. Patient reports that she has not taken Mounjaro  for 2 weeks due to heart burn (she feels it is from using the Mounjaro ). Discussed transition from IV to SQ insulin . Discussed that if she is going to resume her insulin  pump after discharge, we will need to ask attending to stop basal insulin  so she does not receive it on day of discharge to prevent overlap of SQ basal and hourly basal on pump. Patient verbalized understanding of information discussed and has no questions at this time.  Thanks, Beacher Limerick, RN, MSN, CDCES Diabetes Coordinator Inpatient Diabetes Program [phone] (Team Pager from 8am to 5pm)

## 2023-11-11 NOTE — ED Notes (Signed)
 Pt assisted to toilet in with. Pt ambulatory with this tech at side. Pt assisted back to bed and placed back on cardiac monitor. Call light within reach. Pt has no further needs at this time.

## 2023-11-11 NOTE — Progress Notes (Signed)
 [facility] Progress Note  Patient: Tanya Graves    ZOX:[id number]  DOA: 11/10/2023     Date of Service: the patient was seen and examined on 11/11/2023  Chief Complaint  Patient presents with   Hyperglycemia   Brief hospital course: Tanya Graves is a 58 y.o. female with medical history significant of type 1 diabetes on insulin  pump, HLD, dCHF, hypothyroidism, OSA on CPAP, obesity, former smoker, who presents with elevated blood sugar, abdominal pain.   Patient states that her blood sugar has been elevated in the past several days, up to more than 500.  She has polydipsia.  She developed nausea vomiting and abdominal pain today, with 8 times of nonbilious nonbloody vomiting.  No diarrhea.  Her abdominal pain is located in the upper abdomen, constant, aching, moderate, nonradiating, not aggravated or alleviated by any known factors.  No fever or chills.  Denies dysuria or burning on urination.  No chest pain, cough, SOB.     Data reviewed independently and ED Course: pt was found to have DKA (blood sugar 667, bicarbonate of 9, anion gap 25, beta hydroxybutyric acid> 8, pH 7.16 by VBG), WBC 17.6, BNP  326, AKI with creatinine 1.58, BUN 49 and GFR 38 (recent baseline creatinine 0.91 on 04/07/2023), potassium 5.9.   Temp normal, initial BP 80/50 which improved to 122/59 after giving 2L of LR bolus, HR 100-120s, RR 23, O2 Sat 100% on RA.   EKG: I have personally reviewed.  Sinus rhythm, QTc 472, heart rate 108, nonspecific T wave change   Assessment and Plan:  DKA (diabetic ketoacidosis) (HCC): Blood sugar 667, bicarbonate of 9, anion gap 25, beta hydroxybutyric acid> 8, pH 7.16 by VBG.  Mental status normal. Presented with abdominal pain, nausea vomiting due to DKA S/p IVF:  2.5 L of LR bolus S/p Insulin  drip, Endo tool protocol Anion gap closed, blood glucose is improving Transition to subcu insulin  long-acting and sliding scale NovoLog  meal coverage DM coordinator is  following Continue symptomatic treatment for nausea vomiting    Type 1 diabetes mellitus with hyperglycemia (HCC):  Recent A1c 7.5, well controlled.  Patient is using insulin  pump. S/p DKA protocol    Hypercholesteremia: Crestor    Hypothyroidism: Synthroid    Chronic diastolic CHF (congestive heart failure) (HCC): 2D echo on 05/11/2022 showed EF of 60 to 65% with grade 1 diastolic dysfunction.  BNP is mildly elevated at 326, but clinically patient looks dry, no shortness of breath or oxygen desaturation, does not seem to have CHF exacerbation. -Watch volume status closely   Hypotension due to dehydration Status post fluid bolus given Started midodrine 5 mg p.o. 3 times daily with holding parameters Monitor BP and titrate medications accordingly   Leukocytosis: WBC 17.6, no source of infection identified, likely due to DKA. -Follow CBC   AKI (acute kidney injury) (HCC): Likely due to dehydration and continuation of Mobic . - IV fluid as above - Hold Mobic  Creatinine 1.69---1.26   Obesity (BMI 30-39.9): Patient has Obesity Class II, with body weight 116.9 Kg and BMI 39/18 kg/m2.  - Encourage losing weight - Exercise and healthy diet   Hyperkalemia: Potassium 5.9, due to DKA -Expecting correction with insulin  and IV fluid Hyperkalemia resolved  Anemia secondary to iron deficiency, transferrin saturation 10% Started oral iron supplement with vitamin C Folate level within normal range Check B12 level   Body mass index is 41.47 kg/m.  Interventions:  Diet: Diabetic diet DVT Prophylaxis: Subcutaneous Lovenox    Advance goals of care  discussion: Full code  Family Communication: family was not present at bedside, at the time of interview.  The pt provided permission to discuss medical plan with the family. Opportunity was given to ask question and all questions were answered satisfactorily.   Disposition:  Pt is from home, admitted with DKA, still has elevated blood  glucose, which precludes a safe discharge. Discharge to home, when stable, most likely tomorrow a.m.  Subjective: No significant events overnight, patient is feeling a lot better now, abdominal pain resolved, no nausea vomiting.  Patient is hungry and still dry, would like to eat and drink something.  Anion gap closed, diet started.  Physical Exam: General: NAD, lying comfortably Appear in no distress, affect appropriate Eyes: PERRLA ENT: Oral Mucosa Clear, moist  Neck: no JVD,  Cardiovascular: S1 and S2 Present, no Murmur,  Respiratory: good respiratory effort, Bilateral Air entry equal and Decreased, no Crackles, no wheezes Abdomen: Bowel Sound present, Soft and no tenderness,  Skin: no rashes Extremities: no Pedal edema, no calf tenderness Neurologic: without any new focal findings Gait not checked due to patient safety concerns  Vitals:   11/11/23 0934 11/11/23 1000 11/11/23 1100 11/11/23 1200  BP:  108/61 115/61 107/63  Pulse: 93  92 92  Resp: 15 (!) 21 (!) 21 18  Temp: 98 F (36.7 C)   98.1 F (36.7 C)  TempSrc: Oral   Oral  SpO2: 98%  93% 99%  Weight: 123.7 kg     Height: 5' 8 (1.727 m)       Intake/Output Summary (Last 24 hours) at 11/11/2023 1320 Last data filed at 11/11/2023 1119 Gross per 24 hour  Intake 1500.54 ml  Output --  Net 1500.54 ml   Filed Weights   11/10/23 1935 11/11/23 0934  Weight: 116.9 kg 123.7 kg    Data Reviewed: I have personally reviewed and interpreted daily labs, tele strips, imagings as discussed above. I reviewed all nursing notes, pharmacy notes, vitals, pertinent old records I have discussed plan of care as described above with RN and patient/family.  CBC: Recent Labs  Lab 11/10/23 1717 11/11/23 0525  WBC 17.6* 18.6*  NEUTROABS 15.6*  --   HGB 12.3 10.6*  HCT 37.9 30.0*  MCV 98.7 90.6  PLT 220 223   Basic Metabolic Panel: Recent Labs  Lab 11/11/23 0140 11/11/23 0300 11/11/23 0525 11/11/23 0730 11/11/23 1126  NA  134* 138 137 137 137  K 4.1 3.5 3.9 4.0 3.8  CL 104 113* 106 106 106  CO2 19* 17* 23 24 22   GLUCOSE 449* 320* 295* 205* 164*  BUN 60* 52* 59* 60* 56*  CREATININE 1.62* 1.32* 1.46* 1.48* 1.26*  CALCIUM  8.3* 6.9* 8.5* 8.5* 8.6*  MG  --   --   --   --  2.0  PHOS  --   --   --   --  3.5    Studies: No results found.  Scheduled Meds:  enoxaparin  (LOVENOX ) injection  60 mg Subcutaneous Q24H   insulin  aspart  0-15 Units Subcutaneous TID WC   insulin  glargine-yfgn  10 Units Subcutaneous Q24H   levothyroxine   200 mcg Oral Q0600   midodrine  5 mg Oral TID WC   pantoprazole   40 mg Oral Q1200   rosuvastatin   40 mg Oral Daily   varenicline   1 mg Oral BID   Continuous Infusions:  dextrose  5% lactated ringers  125 mL/hr at 11/11/23 1119   lactated ringers  Stopped (11/11/23 0715)   PRN Meds:  acetaminophen , dextrose , morphine  injection, ondansetron  (ZOFRAN ) IV, oxyCODONE   Time spent: 55 minutes  Author: Althia Atlas. MD [facility] 11/11/2023 1:20 PM  To reach On-call, see care teams to locate the attending and reach out to them via [email or web address]. If 7PM-7AM, please contact night-coverage If you still have difficulty reaching the attending provider, please page the [facility] (Director on Call) for [facility] on amion for assistance.

## 2023-11-11 NOTE — Progress Notes (Addendum)
 Heart Failure Stewardship Pharmacy Note  PCP: Judithann Novas, MD PCP-Cardiologist: None  HPI: Tanya Graves is a 58 y.o. female with type I diabetes, OSA,  who presented with DKA.  Pertinent Lab Values: Creatinine  Date Value Ref Range Status  06/30/2014 0.79 0.60 - 1.30 mg/dL Final   Creatinine, Ser  Date Value Ref Range Status  11/11/2023 1.48 (H) 0.44 - 1.00 mg/dL Final   BUN  Date Value Ref Range Status  11/11/2023 60 (H) 6 - 20 mg/dL Final  16/02/9603 20 (H) 7 - 18 mg/dL Final   Potassium  Date Value Ref Range Status  11/11/2023 4.0 3.5 - 5.1 mmol/L Final  06/30/2014 4.6 3.5 - 5.1 mmol/L Final   Sodium  Date Value Ref Range Status  11/11/2023 137 135 - 145 mmol/L Final  06/30/2014 135 (L) 136 - 145 mmol/L Final   B Natriuretic Peptide  Date Value Ref Range Status  11/10/2023 326.4 (H) 0.0 - 100.0 pg/mL Final    Comment:    Performed at [facility], [address] [place], [place] [address]   Magnesium   Date Value Ref Range Status  05/15/2022 2.3 1.7 - 2.4 mg/dL Final    Comment:    Performed at [facility], [address] [place], [place] [address]   Hemoglobin A1C  Date Value Ref Range Status  07/22/2023 7.5 (A) 4.0 - 5.6 % Final  06/07/2016 12.9  Final   Hgb A1c MFr Bld  Date Value Ref Range Status  04/07/2023 8.6 (H) 4.6 - 6.5 % Final    Comment:    Glycemic Control Guidelines for People with Diabetes:Non Diabetic:  <6%Goal of Therapy: <7%Additional Action Suggested:  >8%    TSH  Date Value Ref Range Status  04/15/2023 0.45 0.35 - 5.50 uIU/mL Final    Vital Signs: Admission weight: Temp:  [98.4 F (36.9 C)-99.2 F (37.3 C)] 99.2 F (37.3 C) (06/17 0545) Pulse Rate:  [95-122] 95 (06/17 0830) Cardiac Rhythm: Normal sinus rhythm (06/17 0721) Resp:  [14-23] 14 (06/17 0830) BP: (72-126)/(31-59) 94/38 (06/17 0830) SpO2:  [91 %-100 %] 95 % (06/17 0830) Weight:  [116.9 kg (257 lb 11.2 oz)] 116.9 kg (257 lb 11.2 oz)  (06/16 1935) No intake or output data in the 24 hours ending 11/11/23 0909  Assessment: 1. Echo in 04/2022 noted grade I diastolic dysfunction.  Patient is without radiographic or symptomatic evidence of volume overload, making her stage B. Current symptoms are due to DKA. -Type I diabetes precludes SGLT2i therapy. -BP generally too soft for preventative therapy with spironolactone. -Consider deescalation of midodrine this admission if asymptomatic.  Plan: 1) No medication changes recommended: -Outlined non-pharmacologic recommendations for prevention of symptoms, including glucose control, treatment of OSA, and diet/exercise.  2)  Education: - Patient has been educated on current HF medications and potential additions to HF medication regimen - Patient verbalizes understanding that over the next few months, these medication doses may change and more medications may be added to optimize HF regimen - Patient has been educated on basic disease state pathophysiology and goals of therapy  Medication Assistance / Insurance Benefits Check: Does the patient have prescription insurance?     Will sign off. Please do not hesitate to reach out with questions or concerns,  Bevely Brush, PharmD, CPP, BCPS Heart Failure Pharmacist  Phone - [phone] 11/11/2023 12:59 PM

## 2023-11-12 ENCOUNTER — Telehealth: Payer: Self-pay

## 2023-11-12 DIAGNOSIS — E1010 Type 1 diabetes mellitus with ketoacidosis without coma: Secondary | ICD-10-CM | POA: Diagnosis not present

## 2023-11-12 LAB — CBC
HCT: 31.8 % — ABNORMAL LOW (ref 36.0–46.0)
Hemoglobin: 10.7 g/dL — ABNORMAL LOW (ref 12.0–15.0)
MCH: 31.3 pg (ref 26.0–34.0)
MCHC: 33.6 g/dL (ref 30.0–36.0)
MCV: 93 fL (ref 80.0–100.0)
Platelets: 201 10*3/uL (ref 150–400)
RBC: 3.42 MIL/uL — ABNORMAL LOW (ref 3.87–5.11)
RDW: 13 % (ref 11.5–15.5)
WBC: 13 10*3/uL — ABNORMAL HIGH (ref 4.0–10.5)
nRBC: 0 % (ref 0.0–0.2)

## 2023-11-12 LAB — GLUCOSE, CAPILLARY
Glucose-Capillary: 458 mg/dL — ABNORMAL HIGH (ref 70–99)
Glucose-Capillary: 448 mg/dL — ABNORMAL HIGH (ref 70–99)
Glucose-Capillary: 359 mg/dL — ABNORMAL HIGH (ref 70–99)
Glucose-Capillary: 228 mg/dL — ABNORMAL HIGH (ref 70–99)
Glucose-Capillary: 122 mg/dL — ABNORMAL HIGH (ref 70–99)

## 2023-11-12 LAB — PHOSPHORUS: Phosphorus: 2.8 mg/dL (ref 2.5–4.6)

## 2023-11-12 LAB — BASIC METABOLIC PANEL WITH GFR
Anion gap: 10 (ref 5–15)
BUN: 41 mg/dL — ABNORMAL HIGH (ref 6–20)
CO2: 20 mmol/L — ABNORMAL LOW (ref 22–32)
Calcium: 8.5 mg/dL — ABNORMAL LOW (ref 8.9–10.3)
Chloride: 107 mmol/L (ref 98–111)
Creatinine, Ser: 1.08 mg/dL — ABNORMAL HIGH (ref 0.44–1.00)
GFR, Estimated: >60 mL/min (ref >60)
Glucose, Bld: 431 mg/dL — ABNORMAL HIGH (ref 70–99)
Potassium: 4.6 mmol/L (ref 3.5–5.1)
Sodium: 137 mmol/L (ref 135–145)

## 2023-11-12 LAB — GLUCOSE, RANDOM: Glucose, Bld: 539 mg/dL (ref 70–99)

## 2023-11-12 LAB — MAGNESIUM: Magnesium: 2.2 mg/dL (ref 1.7–2.4)

## 2023-11-12 MED ORDER — VITAMIN D (ERGOCALCIFEROL) 1.25 MG (50000 UNIT) PO CAPS
50000.0000 [IU] | ORAL_CAPSULE | ORAL | Status: DC
  Administered 2023-11-12: 50000 [IU] via ORAL
  Filled 2023-11-12: qty 1

## 2023-11-12 MED ORDER — INSULIN GLARGINE-YFGN 100 UNIT/ML ~~LOC~~ SOLN
22.0000 [IU] | Freq: Two times a day (BID) | SUBCUTANEOUS | Status: DC
  Administered 2023-11-12: 22 [IU] via SUBCUTANEOUS
  Filled 2023-11-12 (×2): qty 0.22

## 2023-11-12 MED ORDER — INSULIN ASPART 100 UNIT/ML IJ SOLN
20.0000 [IU] | Freq: Once | INTRAMUSCULAR | Status: AC
  Administered 2023-11-12: 20 [IU] via SUBCUTANEOUS

## 2023-11-12 MED ORDER — INSULIN ASPART 100 UNIT/ML IJ SOLN: 10.0000 [IU] | Freq: Once | INTRAMUSCULAR | Status: DC

## 2023-11-12 MED ORDER — INSULIN ASPART 100 UNIT/ML IJ SOLN
10.0000 [IU] | Freq: Once | INTRAMUSCULAR | Status: DC
  Filled 2023-11-12: qty 0.1
  Filled 2023-11-12: qty 1

## 2023-11-12 MED ORDER — INSULIN ASPART 100 UNIT/ML IJ SOLN: 15.0000 [IU] | Freq: Once | INTRAMUSCULAR | Status: DC

## 2023-11-12 MED ORDER — INSULIN PUMP
SUBCUTANEOUS | Status: DC
  Filled 2023-11-12: qty 1

## 2023-11-12 MED ORDER — INSULIN ASPART 100 UNIT/ML IJ SOLN
15.0000 [IU] | Freq: Once | INTRAMUSCULAR | Status: AC
  Administered 2023-11-12: 15 [IU] via SUBCUTANEOUS
  Filled 2023-11-12: qty 1

## 2023-11-12 NOTE — Progress Notes (Signed)
 [facility] Progress Note  Patient: Tanya Graves    BMW:[id number]  DOA: 11/10/2023     Date of Service: the patient was seen and examined on 11/12/2023  Chief Complaint  Patient presents with   Hyperglycemia   Brief hospital course: Tanya Graves is a 58 y.o. female with medical history significant of type 1 diabetes on insulin  pump, HLD, dCHF, hypothyroidism, OSA on CPAP, obesity, former smoker, who presents with elevated blood sugar, abdominal pain.   Patient states that her blood sugar has been elevated in the past several days, up to more than 500.  She has polydipsia.  She developed nausea vomiting and abdominal pain today, with 8 times of nonbilious nonbloody vomiting.  No diarrhea.  Her abdominal pain is located in the upper abdomen, constant, aching, moderate, nonradiating, not aggravated or alleviated by any known factors.  No fever or chills.  Denies dysuria or burning on urination.  No chest pain, cough, SOB.     Data reviewed independently and ED Course: pt was found to have DKA (blood sugar 667, bicarbonate of 9, anion gap 25, beta hydroxybutyric acid> 8, pH 7.16 by VBG), WBC 17.6, BNP  326, AKI with creatinine 1.58, BUN 49 and GFR 38 (recent baseline creatinine 0.91 on 04/07/2023), potassium 5.9.   Temp normal, initial BP 80/50 which improved to 122/59 after giving 2L of LR bolus, HR 100-120s, RR 23, O2 Sat 100% on RA.   EKG: I have personally reviewed.  Sinus rhythm, QTc 472, heart rate 108, nonspecific T wave change   Assessment and Plan:  DKA (diabetic ketoacidosis) (HCC): Blood sugar 667, bicarbonate of 9, anion gap 25, beta hydroxybutyric acid> 8, pH 7.16 by VBG.  Mental status normal. Presented with abdominal pain, nausea vomiting due to DKA S/p IVF:  2.5 L of LR bolus S/p Insulin  drip, Endo tool protocol Anion gap closed, Transition to subcu insulin  long-acting and sliding scale. NovoLog  meal coverage DM coordinator is following Continue symptomatic  treatment for nausea vomiting 6/18 d/w her endocrinologist by diabetic coordinator, plan is to start insulin  pump in the evening, basal rate 2.4 units/h and we will DC long-acting insulin . Blood glucose remained uncontrolled today, multiple doses of NovoLog  given and Semglee  22 units given in a.m.   Type 1 diabetes mellitus with hyperglycemia (HCC):  Recent A1c 7.5, well controlled.  Patient is using insulin  pump. S/p DKA protocol    Hypercholesteremia: Crestor    Hypothyroidism: Synthroid    Chronic diastolic CHF (congestive heart failure) (HCC): 2D echo on 05/11/2022 showed EF of 60 to 65% with grade 1 diastolic dysfunction.  BNP is mildly elevated at 326, but clinically patient looks dry, no shortness of breath or oxygen desaturation, does not seem to have CHF exacerbation. -Watch volume status closely   Hypotension due to dehydration Status post fluid bolus given Started midodrine 5 mg p.o. 3 times daily with holding parameters Monitor BP and titrate medications accordingly   Leukocytosis: WBC 17.6, no source of infection identified, likely due to DKA.  WBC 13 gradually improving -Follow CBC   AKI (acute kidney injury) (HCC): Likely due to dehydration and continuation of Mobic . - IV fluid as above - Hold Mobic  Creatinine 1.69---1.08   Obesity (BMI 30-39.9): Patient has Obesity Class II, with body weight 116.9 Kg and BMI 39/18 kg/m2.  - Encourage losing weight - Exercise and healthy diet   Hyperkalemia: Potassium 5.9, due to DKA -Expecting correction with insulin  and IV fluid Hyperkalemia resolved  Anemia secondary to iron  deficiency, transferrin saturation 10% Started oral iron supplement with vitamin C Folate and B12 level within normal range   Vitamin D deficiency: started vitamin D 50,000 units p.o. weekly, follow with PCP to repeat vitamin D level after 3 to 6 months.   Body mass index is 41.47 kg/m.  Interventions:  Diet: Diabetic diet DVT Prophylaxis:  Subcutaneous Lovenox    Advance goals of care discussion: Full code  Family Communication: family was not present at bedside, at the time of interview.  The pt provided permission to discuss medical plan with the family. Opportunity was given to ask question and all questions were answered satisfactorily.   Disposition:  Pt is from home, admitted with DKA, still has elevated blood glucose, which precludes a safe discharge. Discharge to home, when stable, most likely tomorrow a.m.  Subjective: No significant events overnight, overall patient is feeling improvement she is walking around in the hallway.  Blood glucose remained uncontrolled so we will try to start her insulin  pump in the evening.  Patient agreed with the planning, DC plan tomorrow a.m.   Physical Exam: General: NAD, lying comfortably Appear in no distress, affect appropriate Eyes: PERRLA ENT: Oral Mucosa Clear, moist  Neck: no JVD,  Cardiovascular: S1 and S2 Present, no Murmur,  Respiratory: good respiratory effort, Bilateral Air entry equal and Decreased, no Crackles, no wheezes Abdomen: Bowel Sound present, Soft and no tenderness,  Skin: no rashes Extremities: no Pedal edema, no calf tenderness Neurologic: without any new focal findings Gait not checked due to patient safety concerns  Vitals:   11/11/23 1506 11/11/23 2031 11/12/23 0436 11/12/23 0825  BP: 119/66 112/63 139/64 119/63  Pulse: 91 92 91 100  Resp: 16 18 18 16   Temp: 98.5 F (36.9 C) (!) 97.1 F (36.2 C) (!) 97.1 F (36.2 C) 98 F (36.7 C)  TempSrc: Oral   Oral  SpO2: 99% 97% 97% 97%  Weight:      Height:       No intake or output data in the 24 hours ending 11/12/23 1451  Filed Weights   11/10/23 1935 11/11/23 0934  Weight: 116.9 kg 123.7 kg    Data Reviewed: I have personally reviewed and interpreted daily labs, tele strips, imagings as discussed above. I reviewed all nursing notes, pharmacy notes, vitals, pertinent old records I have  discussed plan of care as described above with RN and patient/family.  CBC: Recent Labs  Lab 11/10/23 1717 11/11/23 0525 11/12/23 0338  WBC 17.6* 18.6* 13.0*  NEUTROABS 15.6*  --   --   HGB 12.3 10.6* 10.7*  HCT 37.9 30.0* 31.8*  MCV 98.7 90.6 93.0  PLT 220 223 201   Basic Metabolic Panel: Recent Labs  Lab 11/11/23 0300 11/11/23 0525 11/11/23 0730 11/11/23 1126 11/12/23 0338 11/12/23 0838  NA 138 137 137 137 137  --   K 3.5 3.9 4.0 3.8 4.6  --   CL 113* 106 106 106 107  --   CO2 17* 23 24 22  20*  --   GLUCOSE 320* 295* 205* 164* 431* 539*  BUN 52* 59* 60* 56* 41*  --   CREATININE 1.32* 1.46* 1.48* 1.26* 1.08*  --   CALCIUM  6.9* 8.5* 8.5* 8.6* 8.5*  --   MG  --   --   --  2.0 2.2  --   PHOS  --   --   --  3.5 2.8  --     Studies: No results found.  Scheduled Meds:  vitamin C  500 mg Oral Daily   enoxaparin  (LOVENOX ) injection  60 mg Subcutaneous Q24H   insulin  pump   Subcutaneous Q4H   iron polysaccharides  150 mg Oral Daily   levothyroxine   200 mcg Oral Q0600   midodrine  5 mg Oral TID WC   pantoprazole   40 mg Oral Q1200   rosuvastatin   40 mg Oral Daily   varenicline   1 mg Oral BID   Continuous Infusions:   PRN Meds: acetaminophen , dextrose , morphine  injection, ondansetron  (ZOFRAN ) IV, oxyCODONE   Time spent: 40 minutes  Author: Althia Atlas. MD [facility] 11/12/2023 2:51 PM  To reach On-call, see care teams to locate the attending and reach out to them via [email or web address]. If 7PM-7AM, please contact night-coverage If you still have difficulty reaching the attending provider, please page the [facility] (Director on Call) for [facility] on amion for assistance.

## 2023-11-12 NOTE — Telephone Encounter (Signed)
 Per Dr.Shamleffer Basal Rate changed to 2.4 after being advised that the basal rate was 2.0 by the Diabetic educator.

## 2023-11-12 NOTE — Inpatient Diabetes Management (Addendum)
 Inpatient Diabetes Program Recommendations  AACE/ADA: New Consensus Statement on Inpatient Glycemic Control (2015)  Target Ranges:  Prepandial:   less than 140 mg/dL      Peak postprandial:   less than 180 mg/dL (1-2 hours)      Critically ill patients:  140 - 180 mg/dL    Latest Reference Range & Units 11/11/23 05:19 11/11/23 06:26 11/11/23 09:39 11/11/23 11:12 11/11/23 12:10 11/11/23 17:04 11/11/23 20:34  Glucose-Capillary 70 - 99 mg/dL 119 (H)  IV Insulin  Drip Running 223 (H) 156 (H)  10 units Semglee  @1017  157 (H) 140 (H)  2 units Novolog   IV Insulin  Drip Stopped @1346  323 (H)  12 units Novolog   331 (H)    Latest Reference Range & Units 11/12/23 08:09 11/12/23 11:14  Glucose-Capillary 70 - 99 mg/dL 147 (H)  24 units Novolog  @0940   22 units Semglee  @0940  448 (H)  29 units Novolog    (H): Data is abnormally high   Admit with: DKA  History: Type 1 Diabetes  Home DM Meds: OmniPod insulin  pump with Novolog  (basal 2.0 units/hr (total 48 units per day), I:CR 1:1 (enters 12 grams with meals, 6 grams with snacks), I:SF 1:35 mg/dl)    Mounjaro  7.5 mg Qweek    Dexcom G7   Current Orders: Semglee  22 units BID     Novolog  Sensitive Correction Scale/ SSI (0-9 units) TID AC     Novolog  5 units TID with meals    Met w/ pt at bedside.  She was upset b/c her CBGs are >400 and she really wants to go home today.  Discussed with pt that we should keep her until we have better CBG control.  Pt was angry that no one checked her CBG last night, however, CBG was checked at 8:30pm last evening.  Pt told me she started a new Dexcom glucose sensor this AM--when I was in the room with her at 11:30am the arrow was trending downward.  CBG at 11am was 448--RN came in and gave her 29 units Novolog  for this CBG.  Pt told me she has been drinking water  and I saw pt walking around the unit as well when I arrived to the unit.  Called ENDO office and spoke w/ Dr. Eleonore Grill CMA.  Relayed all of the  above info and all insulin  given since yesterday and also let office know CBGs are >400 today.  CMA spoke w/ Dr. Rosalea Collin and told me to have pt change the basal rate on her pump to 2.4 units/hr.  Leave ISF, CHO ratio, and Target CBG the same.  Relayed this info to Dr. Hubert Madden in person and plan is as follows: Assist pt to change basal rate on pump to 2.4 units/hr Leave all other pump settings the same Check CBG at 1pm Have pt resume home insulin  pump at 5pm w/ [employer]. Check CBGs Q4H. D/C Semglee  and Novolog  orders once pt resumes home insulin  pump.  Reviewed all of the above with pt and she is in agreement--RN also aware.  I assisted pt to adjust the basal rate on her insulin  pump to 2.4 units/hr.  Earma Gloss, RN confirmed new setting with me.  Pt also watched me adjust setting to 2.4 units/hr.  Pt has follow up with ENDO 06/25 and is aware of appt.  Gave pt charging block to charge her cell phone.  Pt knows to restart her Insulin  pump at 5pm this afternoon (has all new pump supplies including insulin  at bedside).  Discontinued the Semglee   and Novolog  SSI and Novolog  Meal Coverage orders as pt will start dosing self with home insulin  pump at 5pm.  MD aware and gave me OK to enter all new orders for Insulin  pump and to Stop SQ Insulin  orders.  Asked pt to keep a close eye on her Dexcom sensor.  I had pt adjust the low alarm to 80 mg/dl.      Basal 12A  2.0 units/hr Total Basal 48 units/day Insulin  to Carb Ratio - 1:1 gram (she enters 12 grams with all meals and 6 grams with snacks) Insulin  Sensitivity- 1:35 mg/dl Target Glucose: 469 mg/dl    Per DM Coordinator note from 06/17: Patient reports she uses auto mode most of the time but she notes that she does get kicked into manual mode often. Discussed that per Dr. Eleonore Grill note on 07/22/23, her basal rate should be 0.7 units/hr (total of 16.8 units per day); however it is set at 2.0 units/hr (total of 48 units per day). Patient states  she does not know how to change any of the settings and she is not sure why it would be different than noted in Dr. Eleonore Grill note. Patient states that she has hypoglycemia sometimes (mostly during the night) and she keeps Glucose SOS powder on bedside table to use to treat lows during the night.    --Will follow patient during hospitalization--  Langston Pippins RN, MSN, CDCES Diabetes Coordinator Inpatient Glycemic Control Team Team Pager: [phone] (8a-5p)

## 2023-11-12 NOTE — Telephone Encounter (Signed)
 Tanya Graves Diabetic educator is calling regarding patient pump settings. Patient was admitted into hospital on 11/10/23 for DKA. Patient has not had pump on for 2 days. Patient has been giving Glargine and Novolog .   8am- 458 given 24 units of Novolog   9:40 amd  given 22 units of Glargine 11am- 448  Patient current basal rate on phone is 0.20  They would like to know what pump setting should be adjusted to as they prepare to discharge patient today.   [phone]

## 2023-11-13 ENCOUNTER — Other Ambulatory Visit: Payer: Self-pay

## 2023-11-13 DIAGNOSIS — E1010 Type 1 diabetes mellitus with ketoacidosis without coma: Secondary | ICD-10-CM | POA: Diagnosis not present

## 2023-11-13 LAB — CBC
HCT: 31.5 % — ABNORMAL LOW (ref 36.0–46.0)
Hemoglobin: 10.4 g/dL — ABNORMAL LOW (ref 12.0–15.0)
MCH: 30.8 pg (ref 26.0–34.0)
MCHC: 33 g/dL (ref 30.0–36.0)
MCV: 93.2 fL (ref 80.0–100.0)
Platelets: 181 10*3/uL (ref 150–400)
RBC: 3.38 MIL/uL — ABNORMAL LOW (ref 3.87–5.11)
RDW: 12.8 % (ref 11.5–15.5)
WBC: 8.5 10*3/uL (ref 4.0–10.5)
nRBC: 0 % (ref 0.0–0.2)

## 2023-11-13 LAB — BASIC METABOLIC PANEL WITH GFR
Anion gap: 7 (ref 5–15)
BUN: 29 mg/dL — ABNORMAL HIGH (ref 6–20)
CO2: 24 mmol/L (ref 22–32)
Calcium: 8.7 mg/dL — ABNORMAL LOW (ref 8.9–10.3)
Chloride: 109 mmol/L (ref 98–111)
Creatinine, Ser: 0.86 mg/dL (ref 0.44–1.00)
GFR, Estimated: >60 mL/min (ref >60)
Glucose, Bld: 87 mg/dL (ref 70–99)
Potassium: 4.5 mmol/L (ref 3.5–5.1)
Sodium: 140 mmol/L (ref 135–145)

## 2023-11-13 LAB — GLUCOSE, CAPILLARY
Glucose-Capillary: 49 mg/dL — ABNORMAL LOW (ref 70–99)
Glucose-Capillary: 75 mg/dL (ref 70–99)
Glucose-Capillary: 81 mg/dL (ref 70–99)
Glucose-Capillary: 98 mg/dL (ref 70–99)

## 2023-11-13 LAB — MAGNESIUM: Magnesium: 2.3 mg/dL (ref 1.7–2.4)

## 2023-11-13 LAB — PHOSPHORUS: Phosphorus: 2.8 mg/dL (ref 2.5–4.6)

## 2023-11-13 MED ORDER — ASCORBIC ACID 500 MG PO TABS
500.0000 mg | ORAL_TABLET | Freq: Every day | ORAL | 2 refills | Status: DC
  Filled 2023-11-13: qty 30, 30d supply, fill #0

## 2023-11-13 MED ORDER — POLYSACCHARIDE IRON COMPLEX 150 MG PO CAPS
150.0000 mg | ORAL_CAPSULE | Freq: Every day | ORAL | 2 refills | Status: DC
Start: 2023-11-14 — End: 2024-01-13
  Filled 2023-11-13: qty 30, 30d supply, fill #0

## 2023-11-13 MED ORDER — VITAMIN D (ERGOCALCIFEROL) 1.25 MG (50000 UNIT) PO CAPS
50000.0000 [IU] | ORAL_CAPSULE | ORAL | 0 refills | Status: DC
  Filled 2023-11-13: qty 12, 84d supply, fill #0

## 2023-11-13 NOTE — Plan of Care (Signed)
   Problem: Education: Goal: Ability to describe self-care measures that may prevent or decrease complications (Diabetes Survival Skills Education) will improve Outcome: Progressing   Problem: Metabolic: Goal: Ability to maintain appropriate glucose levels will improve Outcome: Progressing   Problem: Nutritional: Goal: Maintenance of adequate nutrition will improve Outcome: Progressing

## 2023-11-13 NOTE — TOC Initial Note (Signed)
 Transition of Care [facility]) - Initial/Assessment Note    Patient Details  Name: Tanya Graves MRN: [id number] Date of Birth: 04-Nov-1965  [facility]) CM/SW Contact:    Alexandra Ice, RN Phone Number: 11/13/2023, 8:31 AM  Clinical Narrative:                 Patient is independent. She has PCP. No discharge needs identified at this time, TOC will continue to monitor.        Patient Goals and CMS Choice            Expected Discharge Plan and Services                                              Prior Living Arrangements/Services                       Activities of Daily Living      Permission Sought/Granted                  Emotional Assessment              Admission diagnosis:  DKA (diabetic ketoacidosis) (HCC) [E11.10] Diabetic ketoacidosis without coma associated with diabetes mellitus due to underlying condition (HCC) [E08.10] DKA, type 1 (HCC) [E10.10] Patient Active Problem List   Diagnosis Date Noted   DKA, type 1 (HCC) 11/11/2023   DKA (diabetic ketoacidosis) (HCC) 11/10/2023   Obesity (BMI 30-39.9) 11/10/2023   Leukocytosis 11/10/2023   Chronic diastolic CHF (congestive heart failure) (HCC) 11/10/2023   Abdominal pain 11/10/2023   Hyperkalemia 11/10/2023   LADA (latent autoimmune diabetes in adults), managed as type 1 (HCC) 08/08/2022   Type 1 diabetes mellitus with hyperglycemia (HCC) 06/21/2022   Closed fracture of left proximal humerus 05/13/2022   AKI (acute kidney injury) (HCC) 01/06/2022   ASCUS with positive high risk HPV cervical 10/29/2021   OSA (obstructive sleep apnea) 08/27/2021   Tobacco use 07/22/2018   Hypothyroidism 07/22/2018   Hypercholesteremia 07/22/2018   Type 1 diabetes mellitus with complications (HCC) 01/22/2015   PCP:  Judithann Novas, MD Pharmacy:   [facility] [id number] [place], [place] - [address] [place] [address] Phone:  [phone] Fax: [phone]  [facility] #[id number] [place], [place] - [address] AT [facility] [address] [place] [address] Phone: [phone] Fax: [phone]     Social Drivers of Health (SDOH) Social History: SDOH Screenings   Food Insecurity: No Food Insecurity (11/11/2023)  Housing: Unknown (11/11/2023)  Transportation Needs: No Transportation Needs (11/11/2023)  Utilities: Not At Risk (11/11/2023)  Alcohol Screen: Low Risk  (06/30/2023)  Depression (PHQ2-9): Low Risk  (09/02/2023)  Financial Resource Strain: Low Risk  (06/30/2023)  Physical Activity: Insufficiently Active (04/15/2023)  Social Connections: Moderately Integrated (06/30/2023)  Stress: No Stress Concern Present (06/30/2023)  Tobacco Use: Medium Risk (11/10/2023)  Health Literacy: Adequate Health Literacy (06/30/2023)   SDOH Interventions:     Readmission Risk Interventions     No data to display

## 2023-11-13 NOTE — Progress Notes (Signed)
 Hypoglycemic Event  0010  CBG: 49 [facility] glucometer ~ pt reports 61 (Pt has insulin  pump)  Treatment: 4 oz juice/soda  Symptoms: None  Follow-up CBG: Time:0037 CBG Result:81  Possible Reasons for Event:  Medication regimen:    Comments/MD notified: insulin  adjustments/insulin  pump.     Brace Welte J Kaislyn Gulas

## 2023-11-13 NOTE — Discharge Summary (Signed)
 [facility] Discharge Summary   Patient: Tanya Graves XBJ:[id number]  PCP: Judithann Novas, MD  Date of admission: 11/10/2023   Date of discharge: 11/13/2023     Discharge Diagnoses:  Principal Problem:   DKA, type 1 (HCC) Active Problems:   DKA (diabetic ketoacidosis) (HCC)   Type 1 diabetes mellitus with hyperglycemia (HCC)   Hypercholesteremia   Hypothyroidism   Chronic diastolic CHF (congestive heart failure) (HCC)   Leukocytosis   AKI (acute kidney injury) (HCC)   Abdominal pain   Obesity (BMI 30-39.9)   Hyperkalemia   Admitted From: Home Disposition: Home  Recommendations for Outpatient Follow-up:  Follow-up with PCP in 1 week.  Monitor CBG and continue diabetic diet Repeat iron profile and vitamin D level after 3 to 6 months Follow-up with endocrinologist in few days. Follow up LABS/TEST: As above   Follow-up Information     Bedsole, Amy E, MD Follow up in 1 week(s).   Specialty: Family Medicine Contact information: [address] [place] [address] [phone]                Diet recommendation: Carb modified diet  Activity: The patient is advised to gradually reintroduce usual activities, as tolerated  Discharge Condition: stable  Code Status: Full code   History of present illness: As per the H and P dictated on admission.  Hospital Course:  Tanya Graves is a 58 y.o. female with medical history significant of type 1 diabetes on insulin  pump, HLD, dCHF, hypothyroidism, OSA on CPAP, obesity, former smoker, who presents with elevated blood sugar, abdominal pain.   Patient states that her blood sugar has been elevated in the past several days, up to more than 500.  She has polydipsia.  She developed nausea vomiting and abdominal pain today, with 8 times of nonbilious nonbloody vomiting.  No diarrhea.  Her abdominal pain is located in the upper abdomen, constant, aching, moderate, nonradiating, not aggravated or alleviated by  any known factors.  No fever or chills.  Denies dysuria or burning on urination.  No chest pain, cough, SOB.     Data reviewed independently and ED Course: pt was found to have DKA (blood sugar 667, bicarbonate of 9, anion gap 25, beta hydroxybutyric acid> 8, pH 7.16 by VBG), WBC 17.6, BNP  326, AKI with creatinine 1.58, BUN 49 and GFR 38 (recent baseline creatinine 0.91 on 04/07/2023), potassium 5.9.   Temp normal, initial BP 80/50 which improved to 122/59 after giving 2L of LR bolus, HR 100-120s, RR 23, O2 Sat 100% on RA.   EKG: I have personally reviewed.  Sinus rhythm, QTc 472, heart rate 108, nonspecific T wave change   Assessment and Plan:   # DKA (diabetic ketoacidosis) (HCC): Blood sugar 667, bicarbonate of 9, anion gap 25, beta hydroxybutyric acid> 8, pH 7.16 by VBG.  Mental status normal. Presented with abdominal pain, nausea vomiting due to DKA. S/p IVF:  2.5 L of LR bolus S/p Insulin  drip, Endo tool protocol Anion gap closed, Transition to subcu insulin  long-acting and sliding scale. NovoLog  meal coverage. DM coordinator was following 6/18 d/w her endocrinologist by diabetic coordinator, plan is to start insulin  pump in the evening, basal rate 2.4 units/h and we will DC long-acting insulin . On 6/18 Blood glucose remained uncontrolled, multiple doses of NovoLog  given and Semglee  22 units given in a.m. in the evening patient was advised to use insulin  pump. 6/19 today blood glucose is well-controlled and patient has no symptoms.  Cleared for discharge.  Patient was advised to follow-up with endocrinologist.   # Type 1 diabetes mellitus with hyperglycemia [facility]):  Recent A1c 7.5, well controlled.  Patient is using insulin  pump. S/p DKA protocol    # Hypercholesteremia: Crestor  # Hypothyroidism: Synthroid  # Chronic diastolic CHF (congestive heart failure) TTE on 05/11/2022 showed EF of 60 to 65% with grade 1 diastolic dysfunction.  BNP is mildly elevated at 326, but clinically patient  looks dry, no shortness of breath or oxygen desaturation, does not seem to have CHF exacerbation.  Remained euvolemic.   # Hypotension due to dehydration: s/p fluid bolus given S/p Midodrine 5 mg p.o. TID given during hospital stay.  BP improved, no more need of midodrine. # Leukocytosis: WBC 17.6, no source of infection identified, likely due to DKA.  WBC 8.5, resolved # AKI (acute kidney injury): Likely due to dehydration and continuation of Mobic . S/p IV fluid as above.  Hold Mobic  Creatinine 1.69---0.86.  AKI resolved. # Hyperkalemia: Potassium 5.9, due to DKA. Expected correction with insulin  and IV fluid. Hyperkalemia resolved # Anemia secondary to iron deficiency, transferrin saturation 10% Started oral iron supplement with vitamin C. Folate and B12 level within normal range # Vitamin D deficiency: started vitamin D 50,000 units p.o. weekly, follow with PCP to repeat vitamin D level after 3 to 6 months.  # Obesity, class III. (Morbid obesity) Body mass index is 41.47 kg/m.  Nutrition Interventions: - Encourage losing weight - Exercise and healthy diet  - Patient was instructed, not to drive, operate heavy machinery, perform activities at heights, swimming or participation in water  activities or provide baby sitting services while on Pain, Sleep and Anxiety Medications; until her outpatient Physician has advised to do so again.  - Also recommended to not to take more than prescribed Pain, Sleep and Anxiety Medications.  Patient was ambulatory without any assistance. On the day of the discharge the patient's vitals were stable, and no other acute medical condition were reported by patient. the patient was felt safe to be discharge at Home.  Consultants: Diabetic coordinator  Procedures: None  Discharge Exam: General: Appear in no distress, no Rash; Oral Mucosa Clear, moist. Cardiovascular: S1 and S2 Present, no Murmur, Respiratory: normal respiratory effort, Bilateral Air entry  present and no Crackles, no wheezes Abdomen: Bowel Sound present, Soft and no tenderness, no hernia Extremities: no Pedal edema, no calf tenderness Neurology: alert and oriented to time, place, and person affect appropriate.  Filed Weights   11/10/23 1935 11/11/23 0934  Weight: 116.9 kg 123.7 kg   Vitals:   11/13/23 0427 11/13/23 0829  BP: (!) 143/80 135/71  Pulse: 73 73  Resp: 16 17  Temp: (!) 97.5 F (36.4 C) 98.4 F (36.9 C)  SpO2: 100% 98%    DISCHARGE MEDICATION: Allergies as of 11/13/2023   No Known Allergies      Medication List     TAKE these medications    albuterol  108 (90 Base) MCG/ACT inhaler Commonly known as: VENTOLIN  HFA INHALE 2 PUFFS BY MOUTH EVERY 6 HOURS AS NEEDED FOR WHEEZING OR SHORTNESS OF BREATH What changed: See the new instructions.   ascorbic acid 500 MG tablet Commonly known as: VITAMIN C Take 1 tablet (500 mg total) by mouth daily. Start taking on: November 14, 2023   Baqsimi  One Pack 3 MG/DOSE Powd Generic drug: Glucagon  PLACE 1 DEVICE INTO THE NOSE AS DIRECTED   Contour Next Test test strip Generic drug: glucose blood USE TO TEST BLOOD  SUGAR 6 TIMES A DAY   Dexcom G7 Sensor Misc 1 Device by Does not apply route as directed.   estradiol  0.1 MG/GM vaginal cream Commonly known as: ESTRACE  PLACE 1/4 APPLICATORFUL VAGINALLY AT BEDTIME   iron polysaccharides 150 MG capsule Commonly known as: NIFEREX Take 1 capsule (150 mg total) by mouth daily. Start taking on: November 14, 2023   levothyroxine  200 MCG tablet Commonly known as: SYNTHROID  Take 1 tablet (200 mcg total) by mouth daily.   meloxicam  15 MG tablet Commonly known as: MOBIC  TAKE 1 TABLET BY MOUTH DAILY What changed:  when to take this reasons to take this   NovoLOG  100 UNIT/ML injection Generic drug: insulin  aspart INJECT A MAXIMUM OF 60 UNITS UNDER THE SKIN DAILY What changed: See the new instructions.   Omnipod 5 G7 Pods (Gen 5) Misc 1 Device by Does not apply  route every other day.   rosuvastatin  40 MG tablet Commonly known as: CRESTOR  TAKE 1 TABLET BY MOUTH DAILY   tirzepatide  7.5 MG/0.5ML Pen Commonly known as: MOUNJARO  Inject 7.5 mg into the skin once a week.   tiZANidine 4 MG tablet Commonly known as: ZANAFLEX Take 4 mg by mouth 3 (three) times daily as needed for muscle spasms.   varenicline  1 MG tablet Commonly known as: Chantix  Continuing Month Pak Take 1 tablet (1 mg total) by mouth 2 (two) times daily. What changed: when to take this   Vitamin D (Ergocalciferol) 1.25 MG (50000 UNIT) Caps capsule Commonly known as: DRISDOL Take 1 capsule (50,000 Units total) by mouth every 7 (seven) days. Start taking on: November 19, 2023       No Known Allergies Discharge Instructions     Call MD for:   Complete by: As directed    Uncontrolled blood glucose   Call MD for:  difficulty breathing, headache or visual disturbances   Complete by: As directed    Call MD for:  extreme fatigue   Complete by: As directed    Call MD for:  persistant dizziness or light-headedness   Complete by: As directed    Call MD for:  persistant nausea and vomiting   Complete by: As directed    Call MD for:  severe uncontrolled pain   Complete by: As directed    Call MD for:  temperature >100.4   Complete by: As directed    Diet - low sodium heart healthy   Complete by: As directed    Discharge instructions   Complete by: As directed    Follow-up with PCP in 1 week.  Monitor CBG and continue diabetic diet Repeat iron profile and vitamin D level after 3 to 6 months Follow-up with endocrinologist in few days.   Increase activity slowly   Complete by: As directed        The results of significant diagnostics from this hospitalization (including imaging, microbiology, ancillary and laboratory) are listed below for reference.    Significant Diagnostic Studies: No results found.  Microbiology: Recent Results (from the past 240 hours)  MRSA Next  Gen by PCR, Nasal     Status: None   Collection Time: 11/11/23  9:40 AM   Specimen: Nasal Mucosa; Nasal Swab  Result Value Ref Range Status   MRSA by PCR Next Gen NOT DETECTED NOT DETECTED Final    Comment: (NOTE) The GeneXpert MRSA Assay (FDA approved for NASAL specimens only), is one component of a comprehensive MRSA colonization surveillance program. It is not intended to diagnose MRSA  infection nor to guide or monitor treatment for MRSA infections. Test performance is not FDA approved in patients less than 78 years old. Performed at [facility], [address] [place], [place] [address]      Labs: CBC: Recent Labs  Lab 11/10/23 1717 11/11/23 0525 11/12/23 0338 11/13/23 0348  WBC 17.6* 18.6* 13.0* 8.5  NEUTROABS 15.6*  --   --   --   HGB 12.3 10.6* 10.7* 10.4*  HCT 37.9 30.0* 31.8* 31.5*  MCV 98.7 90.6 93.0 93.2  PLT 220 223 201 181   Basic Metabolic Panel: Recent Labs  Lab 11/11/23 0525 11/11/23 0730 11/11/23 1126 11/12/23 0338 11/12/23 0838 11/13/23 0348  NA 137 137 137 137  --  140  K 3.9 4.0 3.8 4.6  --  4.5  CL 106 106 106 107  --  109  CO2 23 24 22  20*  --  24  GLUCOSE 295* 205* 164* 431* 539* 87  BUN 59* 60* 56* 41*  --  29*  CREATININE 1.46* 1.48* 1.26* 1.08*  --  0.86  CALCIUM  8.5* 8.5* 8.6* 8.5*  --  8.7*  MG  --   --  2.0 2.2  --  2.3  PHOS  --   --  3.5 2.8  --  2.8   Liver Function Tests: Recent Labs  Lab 11/10/23 2350  AST 34  ALT 23  ALKPHOS 71  BILITOT 1.6*  PROT 5.8*  ALBUMIN 3.4*   Recent Labs  Lab 11/10/23 2350  LIPASE 67*   No results for input(s): AMMONIA in the last 168 hours. Cardiac Enzymes: No results for input(s): CKTOTAL, CKMB, CKMBINDEX, TROPONINI in the last 168 hours. BNP (last 3 results) Recent Labs    11/10/23 1717  BNP 326.4*   CBG: Recent Labs  Lab 11/12/23 2029 11/13/23 0010 11/13/23 0037 11/13/23 0424 11/13/23 0825  GLUCAP 122* 49* 81 75 98    Time spent: 35  minutes  Signed:  Althia Atlas  [facility] 11/13/2023 10:59 AM

## 2023-11-13 NOTE — Inpatient Diabetes Management (Addendum)
 Inpatient Diabetes Program Recommendations  AACE/ADA: New Consensus Statement on Inpatient Glycemic Control (2015)  Target Ranges:  Prepandial:   less than 140 mg/dL      Peak postprandial:   less than 180 mg/dL (1-2 hours)      Critically ill patients:  140 - 180 mg/dL    Latest Reference Range & Units 11/12/23 08:09 11/12/23 11:14 11/12/23 13:32 11/12/23 16:52 11/12/23 20:29  Glucose-Capillary 70 - 99 mg/dL 161 (H)  24 units Novolog   22 units Semglee   448 (H)  29 units Novolog   359 (H) 228 (H)  12 units Insulin  per Insulin  Pump for Dinner 122 (H)  (H): Data is abnormally high  Latest Reference Range & Units 11/13/23 00:10 11/13/23 00:37 11/13/23 04:24 11/13/23 08:25  Glucose-Capillary 70 - 99 mg/dL 49 (L) 81 75 98  12 units Insulin  per Insulin  Pump for Breakfast    (L): Data is abnormally low  History: Type 1 Diabetes   Home DM Meds: OmniPod insulin  pump with Novolog  (basal 2.0 units/hr (total 48 units per day), I:CR 1:1 (enters 12 grams with meals, 6 grams with snacks), I:SF 1:35 mg/dl)    Mounjaro  7.5 mg Qweek    Dexcom G7    Current Orders: Insulin  Pump Q4 hours     Pt restarted her Insulin  Pump around 5pm last night  Hypoglycemia event at MN (likely due to SQ Insulin  on board from earlier in the day in conjunction with pt's insulin  pump)  Semglee  should be out of sytem this AM  CBG 98 this AM--Looking better and more stable  From Diabetes standpoint, much more comfortable with pt discharging today (versus yesterday)--She has follow up appt with ENDO 06/25   Addendum 11:30am--Met w/ pt prior to d/c.  Her Dexcom glucose sensor was reading 153 and stable.  I stressed to pt the importance of calling her ENDO if she has persistent Hypoglycemia at home with new basal setting.  Pt told me she may reduce the basal rate back to 2 units/hr if needed since she knows how to adjust the basal setting now.  Pt told me she wanted to change the lower glucose range reading in  her pump to 100 so that the auto mode of her pump will have a goal of 100 mg/dl and not 80 mg/dl for now--I assisted pt to adjust her lower glucose goal to 100 mg/dl.  I also assisted pt to adjust the low blood sugar alert on her Dexcom sensor to 100 mg/dl so it will alert her earlier when glucose levels start to go down.  Pt appreciative of my visit and told me she felt confident going home.     --Will follow patient during hospitalization--  Langston Pippins RN, MSN, CDCES Diabetes Coordinator Inpatient Glycemic Control Team Team Pager: [phone] (8a-5p)

## 2023-11-13 NOTE — Progress Notes (Signed)
 Discharge criteria met

## 2023-11-13 NOTE — TOC Transition Note (Signed)
 Transition of Care [facility]) - Discharge Note   Patient Details  Name: Tanya Graves MRN: [id number] Date of Birth: 01-19-1966  [facility] CM/SW Contact:  Alexandra Ice, RN Phone Number: 11/13/2023, 11:21 AM   Clinical Narrative:    Patient to discharge today. No discharge needs identified by [facility]    Final next level of care: Home/Self Care Barriers to Discharge: Barriers Resolved   Patient Goals and CMS Choice            Discharge Placement                  Name of family member notified: family Patient and family notified of of transfer: 11/13/23  Discharge Plan and Services Additional resources added to the After Visit Summary for                    DME Agency: NA       HH Arranged: NA          Social Drivers of Health (SDOH) Interventions SDOH Screenings   Food Insecurity: No Food Insecurity (11/11/2023)  Housing: Unknown (11/11/2023)  Transportation Needs: No Transportation Needs (11/11/2023)  Utilities: Not At Risk (11/11/2023)  Alcohol Screen: Low Risk  (06/30/2023)  Depression (PHQ2-9): Low Risk  (09/02/2023)  Financial Resource Strain: Low Risk  (06/30/2023)  Physical Activity: Insufficiently Active (04/15/2023)  Social Connections: Moderately Integrated (06/30/2023)  Stress: No Stress Concern Present (06/30/2023)  Tobacco Use: Medium Risk (11/10/2023)  Health Literacy: Adequate Health Literacy (06/30/2023)     Readmission Risk Interventions     No data to display

## 2023-11-14 ENCOUNTER — Telehealth: Payer: Self-pay

## 2023-11-14 NOTE — Transitions of Care (Post Inpatient/ED Visit) (Signed)
   11/14/2023  Name: Tanya Graves MRN: [id number] DOB: 1966/01/21  Today's TOC FU Call Status: Today's TOC FU Call Status:: Successful TOC FU Call Completed TOC FU Call Complete Date: 11/14/23 Patient's Name and Date of Birth confirmed.  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 How have you been since you were released from the hospital?: Better  Items Reviewed: Did you receive and understand the discharge instructions provided?: Yes Medications obtained,verified, and reconciled?: No (unable to go over meds currently driving) Any new allergies since your discharge?: No Dietary orders reviewed?: NA  Medications Reviewed Today: Medications Reviewed Today   Medications were not reviewed in this encounter        Follow up appointments reviewed: PCP Follow-up appointment confirmed?: No (Offered to have appointment made by CMA, patient declines states unable to look at her calendar will call office to make appointment) Specialist Hospital Follow-up appointment confirmed?: Yes Date of Specialist follow-up appointment?: 11/19/23 Follow-Up Specialty Provider:: Endocrinologist Do you need transportation to your follow-up appointment?: No (patient drives) Do you understand care options if your condition(s) worsen?: Yes-patient verbalized understanding  Patient declines 30 day program was unable to talk in dept as she was driving.  [place], RN, BSN, CCM [facility], [facility] RN Care Manager Direct Dial : [phone]

## 2023-11-19 ENCOUNTER — Ambulatory Visit: Payer: BC Managed Care – PPO | Admitting: Internal Medicine

## 2023-11-19 ENCOUNTER — Encounter: Payer: Self-pay | Admitting: Internal Medicine

## 2023-11-19 VITALS — BP 132/80 | HR 72 | Ht 68.0 in | Wt 265.0 lb

## 2023-11-19 DIAGNOSIS — E1169 Type 2 diabetes mellitus with other specified complication: Secondary | ICD-10-CM

## 2023-11-19 DIAGNOSIS — E1065 Type 1 diabetes mellitus with hyperglycemia: Secondary | ICD-10-CM

## 2023-11-19 DIAGNOSIS — E1069 Type 1 diabetes mellitus with other specified complication: Secondary | ICD-10-CM | POA: Diagnosis not present

## 2023-11-19 DIAGNOSIS — E890 Postprocedural hypothyroidism: Secondary | ICD-10-CM | POA: Diagnosis not present

## 2023-11-19 DIAGNOSIS — E785 Hyperlipidemia, unspecified: Secondary | ICD-10-CM

## 2023-11-19 LAB — POCT GLYCOSYLATED HEMOGLOBIN (HGB A1C): Hemoglobin A1C: 7.9 % — AB (ref 4.0–5.6)

## 2023-11-19 NOTE — Patient Instructions (Signed)
 Enter 6 units with small meal          10 units with medium meal           14 units with large meal      HOW TO TREAT LOW BLOOD SUGARS (Blood sugar LESS THAN 70 MG/DL) Please follow the RULE OF 15 for the treatment of hypoglycemia treatment (when your (blood sugars are less than 70 mg/dL)   STEP 1: Take 15 grams of carbohydrates when your blood sugar is low, which includes:  3-4 GLUCOSE TABS  OR 3-4 OZ OF JUICE OR REGULAR SODA OR ONE TUBE OF GLUCOSE GEL    STEP 2: RECHECK blood sugar in 15 MINUTES STEP 3: If your blood sugar is still low at the 15 minute recheck --> then, go back to STEP 1 and treat AGAIN with another 15 grams of carbohydrates.

## 2023-11-19 NOTE — Progress Notes (Signed)
 Name: Tanya Graves  Age/ Sex: 58 y.o., female   MRN/ DOB: [id number], 1965-07-27     PCP: Avelina Greig BRAVO, MD   Reason for Endocrinology Evaluation: Type 2 Diabetes Mellitus  Initial Endocrine Consultative Visit: 07/08/2018    PATIENT IDENTIFIER: Tanya Graves is a 58 y.o. female with a past medical history of T2DM, HTN, Dyslipidemia and hypothyroidism . The patient has followed with Endocrinology clinic since 07/08/2018 for consultative assistance with management of her diabetes.  DIABETIC HISTORY:  Ms. Kimmer was diagnosed with T2DM ~ 2010.  She does not recall intolerance to other oral glycemic agents.  On her initial visit here she was on MDI regimen of insulin  and metformin . Her hemoglobin A1c has ranged from 11.4%  in 2016, peaking at 12.5% in 2019  GLP-1 and SGLT-2 inhibitors have been cost prohibitive in the past    She has been off metformin  since October 2022  DKA 12/2021  Her diagnosis was changed to Type 1 DM with an elevated GAD-65 > 250  ( <5IU/mL and islet Ab 80 ( reference <1.25)  I prescribed mounjaro  before her LADA Dx but this was discontinued after hospitalization for DKA in 04/2022 and we opted to start Ozempic  07/2022  Intolerant to Ozempic -GI side effects  Mounjaro  was not covered due to being T1 DM   Thyroid  Disease   She is S/P RAI ablation years ago > 20 yrs ago and has been on levothyroxine  . She is on Levothyroxine  175 mcg daily   SUBJECTIVE:   During the last visit (07/22/2023): A1c 7.5% .     Today (11/19/2023): Tanya Graves for a follow-up on diabetes management.  She checks her glucose multiple times a day through CGM  She was evaluated by orthopedics in May, 2025 for right elbow and wrist pain, after sustaining a fall at the [employer], she was diagnosed with nondisplaced fracture involving the radial head  She was admitted for DKA 11/10/2023 with a serum glucose of 667 Mg/DL, anion gap of 74,AYA >8    Denies nausea or vomiting   since discharge  Denies constipation or constipation since discharge  Saw podiatry due to right great toe hematoma    This patient with type 1 diabetes is treated with Omnipod (insulin  pump). During the visit the pump basal and bolus doses were reviewed including carb/insulin  rations and supplemental doses. The clinical list was updated. The glucose meter download was reviewed in detail to determine if the current pump settings are providing the best glycemic control without excessive hypoglycemia.  Pump and meter download:     Pump   Omnipod Settings   Insulin  type   Novolog    Basal rate       0000 2.4 u/h               I:C ratio       0000 1:1    12 g TID , 6 g with snacks              Sensitivity       0000  35      Goal       0000  120           PUMP STATISTICS: Average BG: 181 Average Daily Carbs (g): 33.1 Average Total Daily Insulin : 73.4 Average Daily Basal: 39.1 (53 %) Average Daily Bolus: 34.3 (47 %)     CONTINUOUS GLUCOSE MONITORING RECORD INTERPRETATION    Dates of Recording: 6/12-6/25/2025  Sensor  description:dexcom  Results statistics:   CGM use % of time 71.6  Average and SD 181/80  Time in range 59%  % Time Above 180 23  % Time above 250 16  % Time Below target 1   Glycemic patterns summary: BGs are optimal overnight and increased throughout the day  Hyperglycemic episodes  postprandial   Hypoglycemic episodes occurred rarely due to insulin  stacking  Overnight periods: optimal      HOME DIABETES REGIMEN:  Novolog   Mounjaro  7.5 mg weekly  Rosuvastatin  40 mg daily      DIABETIC COMPLICATIONS: Microvascular complications:  Neuropathy , left worsening DR Denies: CKD  Last eye exam: Completed 09/15/2023   Macrovascular complications:    Denies: CAD, PVD, CVA    HISTORY:  Past Medical History:  Past Medical History:  Diagnosis Date   Arthritis    Diabetes mellitus without complication (HCC)    type 2    Thyroid  disease    Past Surgical History:  Past Surgical History:  Procedure Laterality Date   APPENDECTOMY  1993   CALCANEAL OSTEOTOMY Left 07/17/2016   Procedure: CALCANEAL OSTEOTOMY;  Surgeon: Kayla Pinal, MD;  Location: [facility] ORS;  Service: Orthopedics;  Laterality: Left;   COLONOSCOPY  12/18/2018   found 1 3mm polyp, going back in 5 years    COLONOSCOPY WITH PROPOFOL  N/A 12/18/2018   Procedure: COLONOSCOPY WITH PROPOFOL ;  Surgeon: Therisa Bi, MD;  Location: [facility] ENDOSCOPY;  Service: Gastroenterology;  Laterality: N/A;   IRRIGATION AND DEBRIDEMENT ABSCESS N/A 01/24/2015   Procedure: IRRIGATION AND DEBRIDEMENT ABSCESS/GROIN ABSCESS;  Surgeon: Lonni Brands, MD;  Location: [facility] ORS;  Service: General;  Laterality: N/A;   KNEE ARTHROSCOPY W/ MENISCAL REPAIR Right 2008   ROTATOR CUFF REPAIR Right 2015   TONSILLECTOMY  1998   Social History:  reports that she has quit smoking. Her smoking use included cigarettes. She started smoking about 18 months ago. She has a 7.7 pack-year smoking history. She has never used smokeless tobacco. She reports that she does not currently use alcohol. She reports that she does not use drugs. Family History:  Family History  Problem Relation Age of Onset   Diabetes Father    Heart failure Father    Diabetes Brother    Arthritis Mother    Diabetes Mother    Transient ischemic attack Mother    Heart failure Maternal Grandmother    Alcohol abuse Maternal Grandmother    Diabetes Paternal Grandmother    Kidney failure Paternal Grandmother    Heart failure Paternal Grandfather    Cancer Paternal Grandfather        unknown type   Hypertension Brother    Breast cancer Maternal Great-grandmother     OBJECTIVE:   Vital Signs:BP 132/80 (BP Location: Left Arm, Patient Position: Sitting, Cuff Size: Normal)   Pulse 72   Ht 5' 8 (1.727 m)   Wt 265 lb (120.2 kg)   LMP 06/28/2015 (Approximate)   SpO2 97%   BMI 40.29 kg/m   Wt Readings from Last 3  Encounters:  11/19/23 265 lb (120.2 kg)  11/11/23 272 lb 11.3 oz (123.7 kg)  07/22/23 268 lb (121.6 kg)     Exam: General: Pt appears well and is in NAD  Lungs: Clear with good BS bilat   Heart: RRR   Neuro: MS is good with appropriate affect, pt is alert and Ox3   DM foot exam: 03/14/2023 per podiatry The skin of the feet is intact without sores or ulcerations. The  pedal pulses are 2+ on right and 2+ on left. The sensation is intact to a screening 5.07, 10 gram monofilament bilaterally     DATA REVIEWED:  Lab Results  Component Value Date   HGBA1C 7.9 (A) 11/19/2023   HGBA1C 7.5 (A) 07/22/2023   HGBA1C 8.6 (H) 04/07/2023    Latest Reference Range & Units 11/13/23 03:48  Sodium 135 - 145 mmol/L 140  Potassium 3.5 - 5.1 mmol/L 4.5  Chloride 98 - 111 mmol/L 109  CO2 22 - 32 mmol/L 24  Glucose 70 - 99 mg/dL 87  BUN 6 - 20 mg/dL 29 (H)  Creatinine 9.55 - 1.00 mg/dL 9.13  Calcium  8.9 - 10.3 mg/dL 8.7 (L)  Anion gap 5 - 15  7  Phosphorus 2.5 - 4.6 mg/dL 2.8  Magnesium  1.7 - 2.4 mg/dL 2.3  GFR, Estimated >39 mL/min >60  (H): Data is abnormally high (L): Data is abnormally low ISLET CELL ANTIBODY SCREEN NEGATIVE POSITIVE   Glutamic Acid Decarb Ab <5 IU/mL >250 High     ASSESSMENT / PLAN / RECOMMENDATIONS:  1)Type 1 Diabetes Mellitus (LADA) , Sub-optimally  controlled, With Neuropathic and retinopathic complications - Most recent A1c of 7.9 %. Goal A1c < 7.0 %.   -A1c is above goal -She had a recent DKA, it appears that the pump malfunctioned, I did explain to the patient that if she has severe hyperglycemia she has to enter glucose to receive a correction bolus, after 1-2 hours she may receive another correction bolus, but if her BG's do not improve she needs to change the pod as there must be a part malfunction. -She is intolerant to Ozempic  -SGLT2 inhibitors are to be avoided due to risk of DKA -Intolerant to metformin  - She will resume  Mounjaro  MEDICATIONS: Mounjaro  7.5 mg weekly NovoLog    Pump   Omnipod Settings   Insulin  type   Novolog    Basal rate       0000 2.4 u/h               I:C ratio       0000 1:1    # 6, 10,14 g               Sensitivity       0000  30      Goal       0000  120        EDUCATION / INSTRUCTIONS: BG monitoring instructions: Patient is instructed to check her blood sugars 4 times a day, before meals and bedtime. Call [facility] if: BG persistently < 70  I reviewed the Rule of 15 for the treatment of hypoglycemia in detail with the patient. Literature supplied.  2) Post ablative hypothyroidism:  -Patient is clinically euthyroid -TSH has been normal  Medication  Continue levothyroxine  200 mcg daily   3) Dyslipidemia :    -Lipids from 04/07/2023 were reviewed with normal triglycerides and LDL  Medication  Continue rosuvastatin  40 mg daily   F/U in 3 months      Signed electronically by: Stefano Redgie Butts, MD  [facility]  [facility] [address] [place] [place], [place] [address] Phone: [phone] FAX: [phone]   CC: Avelina Greig BRAVO, MD [address] [place] [address] Phone: [phone]  Fax: [phone]  Return to Endocrinology clinic as below: Future Appointments  Date Time Provider Department Center  11/20/2023 12:30 PM Karoline Lima, RN [id number] None  02/18/2024  2:00  PM Ej Pinson, Donell Cardinal, MD [id number] None

## 2023-11-20 ENCOUNTER — Other Ambulatory Visit: Payer: Self-pay

## 2023-11-20 ENCOUNTER — Encounter: Payer: Self-pay | Admitting: Internal Medicine

## 2023-11-24 NOTE — Patient Instructions (Signed)
 Thank you for allowing the Complex Care Management team to participate in your care. It was great speaking with you!  We will follow up on December 17, 2023 at 12:30pm. Please do not hesitate to contact me if you require assistance prior to our next outreach.    [facility] Population [facility] RN Care Manager Direct Dial : [phone]  Fax: [phone] Website: [email or web address]

## 2023-11-24 NOTE — Patient Outreach (Signed)
 Complex Care Management   Visit Note   Name:  Tanya Graves MRN: [id number] DOB: [birth year]  Situation: Referral received for Complex Care Management related to DM and HLD. I obtained verbal consent from Patient.  Visit completed with Ms. Spare via telephone.  Background:   Past Medical History:  Diagnosis Date   Arthritis    Diabetes mellitus without complication (HCC)    type 2   Thyroid  disease     Assessment: Patient Reported Symptoms: Cognitive Cognitive Status: Alert and oriented to person, place, and time, Normal speech and language skills Cognitive/Intellectual Conditions Management [RPT]: None reported or documented in medical history or problem list Health Maintenance Behaviors: Annual physical exam, Stress management, Healthy diet Healing Pattern: Average Health Facilitated by: Healthy diet, Rest, Stress management  Neurological Neurological Review of Symptoms: No symptoms reported Neurological Management Strategies: Routine screening  HEENT HEENT Symptoms Reported: No symptoms reported HEENT Management Strategies: Routine screening  Cardiovascular Cardiovascular Symptoms Reported: No symptoms reported  Respiratory Respiratory Symptoms Reported: No symptoms reported  Endocrine Endocrine Symptoms Reported: No symptoms reported Is patient diabetic?: Yes Is patient checking blood sugars at home?: Yes List most recent blood sugar readings, include date and time of day: Reports fasting sugar of 121 today Endocrine Self-Management Outcome: 4 (good) Endocrine Comment: Patient was hospitalized from June 16-19 and treated for DKA following a malfunction with her insulin  pump. Reports doing well. Denies symptoms since discharge  Gastrointestinal Gastrointestinal Symptoms Reported: No symptoms reported  Genitourinary Genitourinary Symptoms Reported: No symptoms reported  Integumentary Integumentary Symptoms Reported: No symptoms reported  Musculoskeletal Musculoskelatal  Symptoms Reviewed: Other Other Musculoskeletal Symptoms: Reports wearing a removable splint to her right elbow for a closed fracture. Reports incident occurred after accidentally tripping at a farmer[place] market Falls in the past year?: Yes Number of falls in past year: 1 or less Was there an injury with Fall?: Yes Fall Risk Category Calculator: 2 Patient Fall Risk Level: Moderate Fall Risk Patient at Risk for Falls Due to: Medication side effect, Orthopedic patient  Psychosocial Psychosocial Symptoms Reported: No symptoms reported Quality of Family Relationships: supportive Do you feel physically threatened by others?: No      11/20/2023    1:44 PM  Depression screen PHQ 2/9  Decreased Interest 0  Down, Depressed, Hopeless 0  PHQ - 2 Score 0    There were no vitals filed for this visit.  Medications Reviewed Today     Reviewed by Karoline Lima, RN (Registered Nurse) on 11/20/23 at 1254  Med List Status: <None>   Medication Order Taking? Sig Documenting Provider Last Dose Status Informant  albuterol  (VENTOLIN  HFA) 108 (90 Base) MCG/ACT inhaler [id number]  INHALE 2 PUFFS BY MOUTH EVERY 6 HOURS AS NEEDED FOR WHEEZING OR SHORTNESS OF BREATH  Patient taking differently: Inhale 2 puffs into the lungs every 6 (six) hours as needed for wheezing or shortness of breath.   Avelina Greig BRAVO, MD  Active Self, Pharmacy Records           Med Note [facility], MELISSA A   Tue Nov 11, 2023  7:51 AM)    ascorbic acid  (VITAMIN C ) 500 MG tablet [id number]  Take 1 tablet (500 mg total) by mouth daily. Von Bellis, MD  Active   BAQSIMI  ONE PACK 3 MG/DOSE POWD [id number]  PLACE 1 DEVICE INTO THE NOSE AS DIRECTED Shamleffer, Donell Cardinal, MD  Active Self, Pharmacy Records           Med Note [facility], MELISSA A  Tue Nov 11, 2023  7:51 AM)    Continuous Glucose Sensor (DEXCOM G7 SENSOR) MISC [id number]  1 Device by Does not apply route as directed. Shamleffer, Donell Cardinal, MD  Active Self, Pharmacy  Records  estradiol  (ESTRACE ) 0.1 MG/GM vaginal cream [id number]  PLACE 1/4 APPLICATORFUL VAGINALLY AT BEDTIME  Patient not taking: Reported on 11/19/2023   Janit Alm Agent, MD  Active Self, Pharmacy Records  glucose blood (CONTOUR NEXT TEST) test strip [id number]  USE TO TEST BLOOD SUGAR 6 TIMES A DAY Shamleffer, Donell Cardinal, MD  Active Self, Pharmacy Records  insulin  aspart (NOVOLOG ) 100 UNIT/ML injection [id number]  INJECT A MAXIMUM OF 60 UNITS UNDER THE SKIN DAILY  Patient taking differently: Inject 50 Units into the skin as directed.   Shamleffer, Donell Cardinal, MD  Active Self, Pharmacy Records  Insulin  Disposable Pump (OMNIPOD 5 G7 PODS, GEN 5,) MISC [id number]  1 Device by Does not apply route every other day. Shamleffer, Donell Cardinal, MD  Active Self, Pharmacy Records  iron  polysaccharides (NIFEREX) 150 MG capsule [id number]  Take 1 capsule (150 mg total) by mouth daily. Von Bellis, MD  Active   levothyroxine  (SYNTHROID ) 200 MCG tablet [id number]  Take 1 tablet (200 mcg total) by mouth daily. Shamleffer, Donell Cardinal, MD  Active Self, Pharmacy Records  meloxicam  (MOBIC ) 15 MG tablet [id number]  TAKE 1 TABLET BY MOUTH DAILY  Patient taking differently: Take 15 mg by mouth daily as needed for pain.   Janit Thresa HERO, DPM  Active Self, Pharmacy Records  rosuvastatin  (CRESTOR ) 40 MG tablet [id number]  TAKE 1 TABLET BY MOUTH DAILY Shamleffer, Ibtehal Jaralla, MD  Active Self, Pharmacy Records  tirzepatide  (MOUNJARO ) 7.5 MG/0.5ML Pen [id number]  Inject 7.5 mg into the skin once a week. Shamleffer, Donell Cardinal, MD  Active Self, Pharmacy Records  tiZANidine (ZANAFLEX) 4 MG tablet [id number]  Take 4 mg by mouth 3 (three) times daily as needed for muscle spasms. [provider]  Active Self, Pharmacy Records  varenicline  (CHANTIX  CONTINUING MONTH PAK) 1 MG tablet [id number]  Take 1 tablet (1 mg total) by mouth 2 (two) times daily.  Patient taking differently: Take 1 mg by mouth  daily.   Avelina Greig BRAVO, MD  Active Self, Pharmacy Records  Vitamin D , Ergocalciferol , (DRISDOL ) 1.25 MG (50000 UNIT) CAPS capsule [id number]  Take 1 capsule (50,000 Units total) by mouth every 7 (seven) days. Von Bellis, MD  Active             Recommendation:   PCP Follow-up on November 26, 2203 Continue Current Plan of Care  Follow Up Plan:   Telephone follow up appointment with Nurse Case Manager on December 17, 2023    [facility] RN Care Manager Direct Dial : [phone]  Fax: [phone] Website: [email or web address]

## 2023-11-26 ENCOUNTER — Ambulatory Visit: Admitting: Family Medicine

## 2023-11-26 ENCOUNTER — Encounter: Payer: Self-pay | Admitting: Family Medicine

## 2023-11-26 VITALS — BP 128/72 | HR 78 | Temp 98.3°F | Ht 68.0 in | Wt 262.8 lb

## 2023-11-26 DIAGNOSIS — E109 Type 1 diabetes mellitus without complications: Secondary | ICD-10-CM | POA: Diagnosis not present

## 2023-11-26 DIAGNOSIS — D509 Iron deficiency anemia, unspecified: Secondary | ICD-10-CM | POA: Diagnosis not present

## 2023-11-26 DIAGNOSIS — E559 Vitamin D deficiency, unspecified: Secondary | ICD-10-CM | POA: Insufficient documentation

## 2023-11-26 DIAGNOSIS — I5032 Chronic diastolic (congestive) heart failure: Secondary | ICD-10-CM

## 2023-11-26 DIAGNOSIS — E1010 Type 1 diabetes mellitus with ketoacidosis without coma: Secondary | ICD-10-CM

## 2023-11-26 DIAGNOSIS — E108 Type 1 diabetes mellitus with unspecified complications: Secondary | ICD-10-CM

## 2023-11-26 NOTE — Assessment & Plan Note (Signed)
 She thinks she may have been taking vitamin D  daily as opposed to once a week.  She will verify her instructions and dosing when she gets home and let me know.  We can recheck this in 3 months.  This can be contributing to her fatigue and mood issues.

## 2023-11-26 NOTE — Assessment & Plan Note (Signed)
 New finding, she was anemic in 2023 but she also had significant other health problems. This new iron  deficiency anemia may be due to hematemesis that she had prior to admission. Given she still has some mild stomach discomfort we will check her stool for blood with Hemoccult cards x 3 to rule out microscopic blood loss from GI source. Of note she will still need a screening colonoscopy this year.  She is currently taking daily iron  and we will recheck her iron  panel and hemoglobin in 3 months.

## 2023-11-26 NOTE — Progress Notes (Signed)
 Patient ID: Tanya Graves, female    DOB: 05-21-1966, 58 y.o.   MRN: [id number]  This visit was conducted in person.  BP 128/72   Pulse 78   Temp 98.3 F (36.8 C) (Oral)   Ht 5' 8 (1.727 m)   Wt 262 lb 12.8 oz (119.2 kg)   LMP 06/28/2015 (Approximate)   SpO2 98%   BMI 39.96 kg/m    CC:  Chief Complaint  Patient presents with   Hospitalization Follow-up    Patient has meet with her endocrinologist    Subjective:   HPI: Tanya Graves is a 58 y.o. female presenting on 11/26/2023 for Hospitalization Follow-up (Patient has meet with her endocrinologist) TOC call 11/14/2023 Admitted for diabetic ketoacidosis/diabetes type 1 on November 10, 2023.  Presented with elevated blood pressure  CBG 667 and abdominal pain, nausea and vomiting Date of discharge: 11/13/2023 Started on insulin  drip, anion gap closed.  CHF: BNP mildly elevated at 326 but did not appear fluid overloaded. Given midodrine  for hypotension due to dehydration.  Blood pressure improved at discharge White blood cell count elevated upon admission but no source of infection identified.  Likely due to DKA Acute kidney injury likely due to dehydration and Mobic .  Mobic  held, resolved at discharge Hyperkalemia resolved by discharge. During admission she was found to be iron  deficient and vitamin D  deficient.  Started on iron  supplement and vitamin D  weekly high-dose.  ( She feels she was given daily Vit D and will verify)  Reviewed recent office visit note from June 25 with Dr. Erna shoulder endocrinology She is on insulin  pump... ENDO feels that pump may have failed. Lab Results  Component Value Date   HGBA1C 7.9 (A) 11/19/2023    Colonoscopy polyps 2020   She did have dark red emesis when she was ill... this could be cause of anemia.    Recommended repeat iron  profile and vitamin D  level after 3 to 6 months  Relevant past medical, surgical, family and social history reviewed and updated as indicated. Interim  medical history since our last visit reviewed. Allergies and medications reviewed and updated. Outpatient Medications Prior to Visit  Medication Sig Dispense Refill   albuterol  (VENTOLIN  HFA) 108 (90 Base) MCG/ACT inhaler INHALE 2 PUFFS BY MOUTH EVERY 6 HOURS AS NEEDED FOR WHEEZING OR SHORTNESS OF BREATH (Patient taking differently: Inhale 2 puffs into the lungs every 6 (six) hours as needed for wheezing or shortness of breath.) 8.5 g 2   ascorbic acid  (VITAMIN C ) 500 MG tablet Take 1 tablet (500 mg total) by mouth daily. 30 tablet 2   BAQSIMI  ONE PACK 3 MG/DOSE POWD PLACE 1 DEVICE INTO THE NOSE AS DIRECTED 1 each 3   Continuous Glucose Sensor (DEXCOM G7 SENSOR) MISC 1 Device by Does not apply route as directed. 9 each 3   estradiol  (ESTRACE ) 0.1 MG/GM vaginal cream PLACE 1/4 APPLICATORFUL VAGINALLY AT BEDTIME 85 g 0   glucose blood (CONTOUR NEXT TEST) test strip USE TO TEST BLOOD SUGAR 6 TIMES A DAY 100 strip 3   insulin  aspart (NOVOLOG ) 100 UNIT/ML injection INJECT A MAXIMUM OF 60 UNITS UNDER THE SKIN DAILY (Patient taking differently: Inject 50 Units into the skin as directed.) 50 mL 1   Insulin  Disposable Pump (OMNIPOD 5 G7 PODS, GEN 5,) MISC 1 Device by Does not apply route every other day. 45 each 3   levothyroxine  (SYNTHROID ) 200 MCG tablet Take 1 tablet (200 mcg total) by mouth daily. 90  tablet 3   meloxicam  (MOBIC ) 15 MG tablet TAKE 1 TABLET BY MOUTH DAILY (Patient taking differently: Take 15 mg by mouth daily as needed for pain.) 30 tablet 2   rosuvastatin  (CRESTOR ) 40 MG tablet TAKE 1 TABLET BY MOUTH DAILY 90 tablet 3   tirzepatide  (MOUNJARO ) 7.5 MG/0.5ML Pen Inject 7.5 mg into the skin once a week. 6 mL 3   tiZANidine (ZANAFLEX) 4 MG tablet Take 4 mg by mouth 3 (three) times daily as needed for muscle spasms.     Vitamin D , Ergocalciferol , (DRISDOL ) 1.25 MG (50000 UNIT) CAPS capsule Take 1 capsule (50,000 Units total) by mouth every 7 (seven) days. 12 capsule 0   varenicline  (CHANTIX   CONTINUING MONTH PAK) 1 MG tablet Take 1 tablet (1 mg total) by mouth 2 (two) times daily. (Patient taking differently: Take 1 mg by mouth daily.) 90 tablet 1   iron  polysaccharides (NIFEREX) 150 MG capsule Take 1 capsule (150 mg total) by mouth daily. (Patient not taking: Reported on 11/20/2023) 30 capsule 2   No facility-administered medications prior to visit.     Per HPI unless specifically indicated in ROS section below Review of Systems  Constitutional:  Negative for fatigue and fever.  HENT:  Negative for congestion.   Eyes:  Negative for pain.  Respiratory:  Negative for cough and shortness of breath.   Cardiovascular:  Negative for chest pain, palpitations and leg swelling.  Gastrointestinal:  Negative for abdominal pain.  Genitourinary:  Negative for dysuria and vaginal bleeding.  Musculoskeletal:  Negative for back pain.  Neurological:  Negative for syncope, light-headedness and headaches.  Psychiatric/Behavioral:  Negative for dysphoric mood.   All other systems reviewed and are negative.  Objective:  BP 128/72   Pulse 78   Temp 98.3 F (36.8 C) (Oral)   Ht 5' 8 (1.727 m)   Wt 262 lb 12.8 oz (119.2 kg)   LMP 06/28/2015 (Approximate)   SpO2 98%   BMI 39.96 kg/m   Wt Readings from Last 3 Encounters:  11/26/23 262 lb 12.8 oz (119.2 kg)  11/19/23 265 lb (120.2 kg)  11/11/23 272 lb 11.3 oz (123.7 kg)      Physical Exam Constitutional:      General: She is not in acute distress.    Appearance: Normal appearance. She is well-developed. She is not ill-appearing or toxic-appearing.  HENT:     Head: Normocephalic.     Right Ear: Hearing, tympanic membrane, ear canal and external ear normal. Tympanic membrane is not erythematous, retracted or bulging.     Left Ear: Hearing, tympanic membrane, ear canal and external ear normal. Tympanic membrane is not erythematous, retracted or bulging.     Nose: No mucosal edema or rhinorrhea.     Right Sinus: No maxillary sinus  tenderness or frontal sinus tenderness.     Left Sinus: No maxillary sinus tenderness or frontal sinus tenderness.     Mouth/Throat:     Pharynx: Uvula midline.  Eyes:     General: Lids are normal. Lids are everted, no foreign bodies appreciated.     Conjunctiva/sclera: Conjunctivae normal.     Pupils: Pupils are equal, round, and reactive to light.  Neck:     Thyroid : No thyroid  mass or thyromegaly.     Vascular: No carotid bruit.     Trachea: Trachea normal.  Cardiovascular:     Rate and Rhythm: Normal rate and regular rhythm.     Pulses: Normal pulses.     Heart sounds:  Normal heart sounds, S1 normal and S2 normal. No murmur heard.    No friction rub. No gallop.  Pulmonary:     Effort: Pulmonary effort is normal. No tachypnea or respiratory distress.     Breath sounds: Normal breath sounds. No decreased breath sounds, wheezing, rhonchi or rales.  Abdominal:     General: Bowel sounds are normal.     Palpations: Abdomen is soft.     Tenderness: There is abdominal tenderness in the epigastric area. There is no right CVA tenderness, left CVA tenderness, guarding or rebound.     Hernia: No hernia is present.  Musculoskeletal:     Cervical back: Normal range of motion and neck supple.  Skin:    General: Skin is warm and dry.     Findings: No rash.  Neurological:     Mental Status: She is alert.  Psychiatric:        Mood and Affect: Mood is not anxious or depressed.        Speech: Speech normal.        Behavior: Behavior normal. Behavior is cooperative.        Thought Content: Thought content normal.        Judgment: Judgment normal.       Results for orders placed or performed in visit on 11/19/23  POCT glycosylated hemoglobin (Hb A1C)   Collection Time: 11/19/23  2:57 PM  Result Value Ref Range   Hemoglobin A1C 7.9 (A) 4.0 - 5.6 %   HbA1c POC (<> result, manual entry)     HbA1c, POC (prediabetic range)     HbA1c, POC (controlled diabetic range)      Assessment and  Plan  Type 1 diabetes mellitus with ketoacidosis without coma (HCC) Assessment & Plan: Acute, likely secondary to pump failure per endocrine.  No sign of infection.  Now resolved.   Vitamin D  deficiency Assessment & Plan: She thinks she may have been taking vitamin D  daily as opposed to once a week.  She will verify her instructions and dosing when she gets home and let me know.  We can recheck this in 3 months.  This can be contributing to her fatigue and mood issues.   Iron  deficiency anemia, unspecified iron  deficiency anemia type Assessment & Plan: New finding, she was anemic in 2023 but she also had significant other health problems. This new iron  deficiency anemia may be due to hematemesis that she had prior to admission. Given she still has some mild stomach discomfort we will check her stool for blood with Hemoccult cards x 3 to rule out microscopic blood loss from GI source. Of note she will still need a screening colonoscopy this year.  She is currently taking daily iron  and we will recheck her iron  panel and hemoglobin in 3 months.  Orders: -     Hemoccult Cards (X3 cards); Future  Chronic diastolic CHF (congestive heart failure) (HCC) Assessment & Plan:  Very mild likely associated with life threatening illness in 2023.  Consider repeat.  No fluid overload. Not requiring diuretic.    Type 1 diabetes mellitus with complications (HCC) Assessment & Plan: On insulin  pump followed by endocrinology.     Return in about 3 months (around 02/26/2024) for  follow up vit D and iron  def anemia with lasb priro for vit D, iron  panel and hg.   Greig Ring, MD

## 2023-11-26 NOTE — Assessment & Plan Note (Signed)
 Very mild likely associated with life threatening illness in 2023.  Consider repeat.  No fluid overload. Not requiring diuretic.

## 2023-11-26 NOTE — Assessment & Plan Note (Signed)
 On insulin  pump followed by endocrinology.

## 2023-11-26 NOTE — Patient Instructions (Signed)
 Check stool for blood.

## 2023-11-26 NOTE — Assessment & Plan Note (Signed)
 Acute, likely secondary to pump failure per endocrine.  No sign of infection.  Now resolved.

## 2023-12-02 ENCOUNTER — Other Ambulatory Visit (INDEPENDENT_AMBULATORY_CARE_PROVIDER_SITE_OTHER): Payer: Self-pay

## 2023-12-02 DIAGNOSIS — D509 Iron deficiency anemia, unspecified: Secondary | ICD-10-CM

## 2023-12-03 ENCOUNTER — Other Ambulatory Visit: Payer: Self-pay | Admitting: Family Medicine

## 2023-12-03 ENCOUNTER — Ambulatory Visit: Payer: Self-pay | Admitting: Family Medicine

## 2023-12-03 LAB — HEMOCCULT SLIDES (X 3 CARDS)
Fecal Occult Blood: NEGATIVE
OCCULT 1: NEGATIVE
OCCULT 2: NEGATIVE
OCCULT 3: NEGATIVE
OCCULT 4: NEGATIVE
OCCULT 5: NEGATIVE

## 2023-12-17 ENCOUNTER — Other Ambulatory Visit: Payer: Self-pay

## 2023-12-17 ENCOUNTER — Ambulatory Visit (INDEPENDENT_AMBULATORY_CARE_PROVIDER_SITE_OTHER): Admitting: Family Medicine

## 2023-12-17 ENCOUNTER — Encounter: Payer: Self-pay | Admitting: Family Medicine

## 2023-12-17 VITALS — BP 124/84 | HR 92 | Temp 98.0°F | Ht 68.0 in | Wt 263.0 lb

## 2023-12-17 DIAGNOSIS — S61219A Laceration without foreign body of unspecified finger without damage to nail, initial encounter: Secondary | ICD-10-CM | POA: Diagnosis not present

## 2023-12-17 DIAGNOSIS — J22 Unspecified acute lower respiratory infection: Secondary | ICD-10-CM

## 2023-12-17 DIAGNOSIS — E108 Type 1 diabetes mellitus with unspecified complications: Secondary | ICD-10-CM | POA: Diagnosis not present

## 2023-12-17 DIAGNOSIS — J4521 Mild intermittent asthma with (acute) exacerbation: Secondary | ICD-10-CM | POA: Diagnosis not present

## 2023-12-17 DIAGNOSIS — H6502 Acute serous otitis media, left ear: Secondary | ICD-10-CM

## 2023-12-17 MED ORDER — AMOXICILLIN-POT CLAVULANATE 875-125 MG PO TABS
1.0000 | ORAL_TABLET | Freq: Two times a day (BID) | ORAL | 0 refills | Status: AC
Start: 2023-12-17 — End: 2023-12-27

## 2023-12-17 NOTE — Progress Notes (Unsigned)
 Ph: [phone] Fax: [phone]   Patient ID: Tanya Graves, female    DOB: 1965-06-08, 58 y.o.   MRN: [id number]  This visit was conducted in person.  BP 124/84   Pulse 92   Temp 98 F (36.7 C) (Oral)   Ht 5' 8 (1.727 m)   Wt 263 lb (119.3 kg)   LMP 06/28/2015 (Approximate)   SpO2 95%   BMI 39.99 kg/m    CC: nasal congestion, ear fullness  Subjective:   HPI: Tanya Graves is a 58 y.o. female presenting on 12/17/2023 for Nasal Congestion (X 1 week with b/l ear fullness ) and Laceration (Right pointer finger cut over the weekend )   1+ wk h/o significant nasal congestion and bilat maxillary sinus pressure associated with L>R muffled hearing without ear pain. Eyes feel burning but no eye pain. Increased productive cough, some chest tightness. Notes more fatigue. PNdrainage. Cough keeping her up at night.   No fevers/chills, ST, abd pain, nausea/vomiting, diarrhea, headache.   Treating with allergy medicines, OTC cold remedies, sinus treatment without benefit.  She started using nasal lavage with benefit.   H/o asthma managed with PRN albuterol  inhaler, flares with respiratory infections. She's also started using this.  Type 1 diabetic on novolog  through [facility], also on mounjaro .   No sick contacts at home. No smokers at home.   DOI: 12/13/2023 Cut R index finger over weekend while using small mandolin. Cut tip of R index finger. Initially treated with peroxide, washing with soap. Dressed with bandage. However it has continued to bleed if she hits tip of finger.  Tdap 02/2019     Relevant past medical, surgical, family and social history reviewed and updated as indicated. Interim medical history since our last visit reviewed. Allergies and medications reviewed and updated. Outpatient Medications Prior to Visit  Medication Sig Dispense Refill   albuterol  (VENTOLIN  HFA) 108 (90 Base) MCG/ACT inhaler INHALE 2 PUFFS BY MOUTH EVERY 6 HOURS AS NEEDED FOR WHEEZING OR  SHORTNESS OF BREATH 8.5 g 2   ascorbic acid  (VITAMIN C ) 500 MG tablet Take 1 tablet (500 mg total) by mouth daily. 30 tablet 2   BAQSIMI  ONE PACK 3 MG/DOSE POWD PLACE 1 DEVICE INTO THE NOSE AS DIRECTED 1 each 3   Continuous Glucose Sensor (DEXCOM G7 SENSOR) MISC 1 Device by Does not apply route as directed. 9 each 3   estradiol  (ESTRACE ) 0.1 MG/GM vaginal cream PLACE 1/4 APPLICATORFUL VAGINALLY AT BEDTIME 85 g 0   glucose blood (CONTOUR NEXT TEST) test strip USE TO TEST BLOOD SUGAR 6 TIMES A DAY 100 strip 3   insulin  aspart (NOVOLOG ) 100 UNIT/ML injection INJECT A MAXIMUM OF 60 UNITS UNDER THE SKIN DAILY (Patient taking differently: Inject 50 Units into the skin as directed.) 50 mL 1   Insulin  Disposable Pump (OMNIPOD 5 G7 PODS, GEN 5,) MISC 1 Device by Does not apply route every other day. 45 each 3   levothyroxine  (SYNTHROID ) 200 MCG tablet Take 1 tablet (200 mcg total) by mouth daily. 90 tablet 3   meloxicam  (MOBIC ) 15 MG tablet TAKE 1 TABLET BY MOUTH DAILY (Patient taking differently: Take 15 mg by mouth daily as needed for pain.) 30 tablet 2   rosuvastatin  (CRESTOR ) 40 MG tablet TAKE 1 TABLET BY MOUTH DAILY 90 tablet 3   tirzepatide  (MOUNJARO ) 7.5 MG/0.5ML Pen Inject 7.5 mg into the skin once a week. 6 mL 3   Vitamin D , Ergocalciferol , (DRISDOL ) 1.25 MG (50000 UNIT)  CAPS capsule Take 1 capsule (50,000 Units total) by mouth every 7 (seven) days. 12 capsule 0   iron  polysaccharides (NIFEREX) 150 MG capsule Take 1 capsule (150 mg total) by mouth daily. (Patient not taking: Reported on 11/20/2023) 30 capsule 2   tiZANidine (ZANAFLEX) 4 MG tablet Take 4 mg by mouth 3 (three) times daily as needed for muscle spasms. (Patient not taking: Reported on 12/17/2023)     No facility-administered medications prior to visit.     Per HPI unless specifically indicated in ROS section below Review of Systems  Objective:  BP 124/84   Pulse 92   Temp 98 F (36.7 C) (Oral)   Ht 5' 8 (1.727 m)   Wt 263 lb  (119.3 kg)   LMP 06/28/2015 (Approximate)   SpO2 95%   BMI 39.99 kg/m   Wt Readings from Last 3 Encounters:  12/17/23 263 lb (119.3 kg)  11/26/23 262 lb 12.8 oz (119.2 kg)  11/19/23 265 lb (120.2 kg)      Physical Exam Vitals and nursing note reviewed.  Constitutional:      Appearance: Normal appearance. She is not ill-appearing.  HENT:     Head: Normocephalic and atraumatic.     Right Ear: Tympanic membrane, ear canal and external ear normal. There is no impacted cerumen.     Left Ear: Ear canal and external ear normal. Decreased hearing noted. A middle ear effusion is present. There is no impacted cerumen. Tympanic membrane is not erythematous.     Nose: Nose normal. No congestion or rhinorrhea.     Mouth/Throat:     Mouth: Mucous membranes are moist.     Pharynx: Oropharynx is clear. No oropharyngeal exudate or posterior oropharyngeal erythema.  Eyes:     Extraocular Movements: Extraocular movements intact.     Conjunctiva/sclera: Conjunctivae normal.     Pupils: Pupils are equal, round, and reactive to light.  Cardiovascular:     Rate and Rhythm: Normal rate and regular rhythm.     Pulses: Normal pulses.     Heart sounds: Normal heart sounds. No murmur heard. Pulmonary:     Effort: Pulmonary effort is normal. No respiratory distress.     Breath sounds: Wheezing (mild exp wheezing bilaterally) present. No rhonchi or rales.  Lymphadenopathy:     Head:     Right side of head: No submental, submandibular, tonsillar, preauricular or posterior auricular adenopathy.     Left side of head: No submental, submandibular, tonsillar, preauricular or posterior auricular adenopathy.     Cervical: No cervical adenopathy.     Right cervical: No superficial cervical adenopathy.    Left cervical: No superficial cervical adenopathy.     Upper Body:     Right upper body: No supraclavicular adenopathy.     Left upper body: No supraclavicular adenopathy.  Skin:    General: Skin is warm and  dry.     Findings: Laceration and wound present. No rash.     Comments: Wound to R index finger distal pulp mostly covered with residual skin without surrounding erythema   Neurological:     Mental Status: She is alert.  Psychiatric:        Mood and Affect: Mood normal.        Behavior: Behavior normal.        Assessment & Plan:   Problem List Items Addressed This Visit     Type 1 diabetes mellitus with complications (HCC)   Reactive airway disease with acute exacerbation  Evidence of current exacerbation in setting of URI.  Avoid prednisone in type 1 diabetic.  Rec scheduled albuterol  inhaler 2 puffs QID over next several days.  Update if not improving with this.       Acute respiratory infection - Primary   With evidence of serous otitis on left. Rx augmentin  10d course.  May continue nasal lavage, OTC treatment.  See below re: RAD treatment.       Finger laceration, initial encounter   Simple laceration to R index finger pulp that occurred several days ago with small mandolin. No signs of active infection. Overlying skin mostly preserved. Discussed wound care, discussed pros/cons of closure with dermabond tissue adhesive - will treat given she endorses ongoing bleeding if finger bumped.  UTD tetanus.  Rx augmentin  for URI with serous otitis as per above.       Non-recurrent acute serous otitis media of left ear   Rx augmentin  as per below.  Discussed flonase, nasal lavage use.       Relevant Medications   amoxicillin -clavulanate (AUGMENTIN ) 875-125 MG tablet     Meds ordered this encounter  Medications   amoxicillin -clavulanate (AUGMENTIN ) 875-125 MG tablet    Sig: Take 1 tablet by mouth 2 (two) times daily for 10 days.    Dispense:  20 tablet    Refill:  0    No orders of the defined types were placed in this encounter.   Patient Instructions  I think you have sinus inflammation possible infection leading to fluid in the ears and asthma  exacerbation. Treat with augmentin  antibiotic twice daily for 10 days, scheduled albuterol  inhaler 2 puffs every 4-6 hours over next 2 days, start OTC flonase nasal steroid 2 squirts into each nostril daily.  Let us  know if not improving with this.  Muffled hearing may last several weeks to fully improve. If ongoing past 3-4 weeks, let us  know for ENT evaluation. Let us  know if fever >101, worsening productive cough, or worsening asthma symptoms.    Follow up plan: Return if symptoms worsen or fail to improve.  Anton Blas, MD

## 2023-12-17 NOTE — Patient Outreach (Unsigned)
 Complex Care Management   Visit Note  12/17/2023  Name:  Tanya Graves MRN: [id number] DOB: 1965-06-20  Situation: Referral received for Complex Care Management related to Diabetes and Hyperlipidemia I obtained verbal consent from Patient.  Visit completed with Tanya Graves via telephone.  Background:   Past Medical History:  Diagnosis Date   Arthritis    Diabetes mellitus without complication (HCC)    type 2   Thyroid  disease     Assessment: Patient Reported Symptoms: Cognitive Cognitive Status: Alert and oriented to person, place, and time, Normal speech and language skills Cognitive/Intellectual Conditions Management [RPT]: None reported or documented in medical history or problem list Health Maintenance Behaviors: Annual physical exam, Stress management, Healthy diet Healing Pattern: Average Health Facilitated by: Healthy diet, Rest  Neurological Neurological Review of Symptoms: No symptoms reported Neurological Management Strategies: Routine screening Neurological Self-Management Outcome: 4 (good)  HEENT HEENT Symptoms Reported: No symptoms reported HEENT Management Strategies: Routine screening HEENT Self-Management Outcome: 4 (good)  Cardiovascular Cardiovascular Symptoms Reported: No symptoms reported Does patient have uncontrolled Hypertension?: No  Respiratory Respiratory Symptoms Reported: Productive cough, Other: Other Respiratory Symptoms: Congestion Additional Respiratory Details: Reports productive cough and nasal congestion for several days. Denies shortness of breath. Pending acute evaluation with Primary Care provider today. Respiratory Management Strategies: Coping strategies, Routine screening, Medication therapy  Endocrine Endocrine Symptoms Reported: No symptoms reported Is patient diabetic?: Yes Is patient checking blood sugars at home?: Yes List most recent blood sugar readings, include date and time of day: Reports wide fluctuations with readings within  the past week. Notes reading today of 179 mg/dl. Reports fluctuations are likely due to possible respiratory infection.  Pending acute evaluation in the Primary Care clinic today Endocrine Self-Management Outcome: 4 (good)  Gastrointestinal Gastrointestinal Symptoms Reported: No symptoms reported Gastrointestinal Self-Management Outcome: 4 (good)  Genitourinary Genitourinary Symptoms Reported: No symptoms reported Genitourinary Self-Management Outcome: 4 (good)  Integumentary Integumentary Symptoms Reported: Not assessed  Musculoskeletal Musculoskelatal Symptoms Reviewed: No symptoms reported  Psychosocial Psychosocial Symptoms Reported: No symptoms reported Behavioral Management Strategies: Support system Behavioral Health Self-Management Outcome: 4 (good) Major Change/Loss/Stressor/Fears (CP): Denies Quality of Family Relationships: supportive Do you feel physically threatened by others?: No      12/17/2023    2:05 PM  Depression screen PHQ 2/9  Decreased Interest 0  Down, Depressed, Hopeless 0  PHQ - 2 Score 0  Altered sleeping 1  Tired, decreased energy 1  Change in appetite 0  Feeling bad or failure about yourself  0  Trouble concentrating 0  Moving slowly or fidgety/restless 0  Suicidal thoughts 0  PHQ-9 Score 2    There were no vitals filed for this visit.  Medications Reviewed Today     Reviewed by Karoline Lima, RN (Registered Nurse) on 12/17/23 at 2319  Med List Status: <None>   Medication Order Taking? Sig Documenting Provider Last Dose Status Informant  albuterol  (VENTOLIN  HFA) 108 (90 Base) MCG/ACT inhaler [id number]  INHALE 2 PUFFS BY MOUTH EVERY 6 HOURS AS NEEDED FOR WHEEZING OR SHORTNESS OF BREATH Bedsole, Amy E, MD  Active Self, Pharmacy Records           Med Note [facility], MELISSA A   Tue Nov 11, 2023  7:51 AM)    amoxicillin -clavulanate (AUGMENTIN ) 875-125 MG tablet [id number]  Take 1 tablet by mouth 2 (two) times daily for 10 days. Rilla Baller, MD   Active   ascorbic acid  (VITAMIN C ) 500 MG tablet [id number]  Take 1 tablet (500  mg total) by mouth daily. Von Bellis, MD  Active   BAQSIMI  ONE PACK 3 MG/DOSE POWD [id number]  PLACE 1 DEVICE INTO THE NOSE AS DIRECTED Shamleffer, Donell Cardinal, MD  Active Self, Pharmacy Records           Med Note [facility], MELISSA A   Tue Nov 11, 2023  7:51 AM)    Continuous Glucose Sensor (DEXCOM G7 SENSOR) MISC [id number]  1 Device by Does not apply route as directed. Shamleffer, Donell Cardinal, MD  Active Self, Pharmacy Records  estradiol  (ESTRACE ) 0.1 MG/GM vaginal cream [id number]  PLACE 1/4 APPLICATORFUL VAGINALLY AT BEDTIME Janit Alm Agent, MD  Active Self, Pharmacy Records  glucose blood (CONTOUR NEXT TEST) test strip [id number]  USE TO TEST BLOOD SUGAR 6 TIMES A DAY Shamleffer, Donell Cardinal, MD  Active Self, Pharmacy Records  insulin  aspart (NOVOLOG ) 100 UNIT/ML injection [id number]  INJECT A MAXIMUM OF 60 UNITS UNDER THE SKIN DAILY  Patient taking differently: Inject 50 Units into the skin as directed.   Shamleffer, Donell Cardinal, MD  Active Self, Pharmacy Records  Insulin  Disposable Pump (OMNIPOD 5 G7 PODS, GEN 5,) MISC [id number]  1 Device by Does not apply route every other day. Shamleffer, Donell Cardinal, MD  Active Self, Pharmacy Records  iron  polysaccharides (NIFEREX) 150 MG capsule [id number]  Take 1 capsule (150 mg total) by mouth daily.  Patient not taking: Reported on 11/20/2023   Von Bellis, MD  Active   levothyroxine  (SYNTHROID ) 200 MCG tablet [id number]  Take 1 tablet (200 mcg total) by mouth daily. Shamleffer, Donell Cardinal, MD  Active Self, Pharmacy Records  meloxicam  (MOBIC ) 15 MG tablet [id number]  TAKE 1 TABLET BY MOUTH DAILY  Patient taking differently: Take 15 mg by mouth daily as needed for pain.   Janit Thresa HERO, DPM  Active Self, Pharmacy Records  rosuvastatin  (CRESTOR ) 40 MG tablet [id number]  TAKE 1 TABLET BY MOUTH DAILY Shamleffer, Ibtehal Jaralla, MD  Active Self,  Pharmacy Records  tirzepatide  (MOUNJARO ) 7.5 MG/0.5ML Pen [id number]  Inject 7.5 mg into the skin once a week. Shamleffer, Donell Cardinal, MD  Active Self, Pharmacy Records  tiZANidine (ZANAFLEX) 4 MG tablet [id number]  Take 4 mg by mouth 3 (three) times daily as needed for muscle spasms.  Patient not taking: Reported on 12/17/2023   [provider]  Active Self, Pharmacy Records  Vitamin D , Ergocalciferol , (DRISDOL ) 1.25 MG (50000 UNIT) CAPS capsule [id number]  Take 1 capsule (50,000 Units total) by mouth every 7 (seven) days. Von Bellis, MD  Active             Recommendation:   Complete Acute visit for sinus congestion/productive cough today. Continue Current Plan of Care  Follow Up Plan:   Telephone follow up appointment with Nurse Case Manager on January 23, 2024   Jackson Acron [facility] RN Care Manager Direct Dial : [phone]  Fax: [phone] Website: [email or web address]

## 2023-12-17 NOTE — Patient Instructions (Signed)
Thank you for allowing the Chronic Care Management team to participate in your care.  

## 2023-12-17 NOTE — Patient Instructions (Addendum)
 I think you have sinus inflammation possible infection leading to fluid in the ears and asthma exacerbation. Treat with augmentin  antibiotic twice daily for 10 days, scheduled albuterol  inhaler 2 puffs every 4-6 hours over next 2 days, start OTC flonase nasal steroid 2 squirts into each nostril daily.  Let us  know if not improving with this.  Muffled hearing may last several weeks to fully improve. If ongoing past 3-4 weeks, let us  know for ENT evaluation. Let us  know if fever >101, worsening productive cough, or worsening asthma symptoms.

## 2023-12-18 ENCOUNTER — Encounter: Payer: Self-pay | Admitting: Family Medicine

## 2023-12-18 DIAGNOSIS — J22 Unspecified acute lower respiratory infection: Secondary | ICD-10-CM | POA: Insufficient documentation

## 2023-12-18 DIAGNOSIS — S61219A Laceration without foreign body of unspecified finger without damage to nail, initial encounter: Secondary | ICD-10-CM | POA: Insufficient documentation

## 2023-12-18 DIAGNOSIS — H6502 Acute serous otitis media, left ear: Secondary | ICD-10-CM | POA: Insufficient documentation

## 2023-12-18 DIAGNOSIS — J45901 Unspecified asthma with (acute) exacerbation: Secondary | ICD-10-CM | POA: Insufficient documentation

## 2023-12-18 NOTE — Assessment & Plan Note (Addendum)
 Rx augmentin  as per below.  Discussed flonase, nasal lavage use.

## 2023-12-18 NOTE — Assessment & Plan Note (Addendum)
 With evidence of serous otitis on left. Rx augmentin  10d course.  May continue nasal lavage, OTC treatment.  See below re: RAD treatment.

## 2023-12-18 NOTE — Assessment & Plan Note (Signed)
 Evidence of current exacerbation in setting of URI.  Avoid prednisone in type 1 diabetic.  Rec scheduled albuterol  inhaler 2 puffs QID over next several days.  Update if not improving with this.

## 2023-12-18 NOTE — Assessment & Plan Note (Addendum)
 Simple laceration to R index finger pulp that occurred several days ago with small mandolin. No signs of active infection. Overlying skin mostly preserved. Discussed wound care, discussed pros/cons of closure with dermabond tissue adhesive - will treat given she endorses ongoing bleeding if finger bumped.  UTD tetanus.  Rx augmentin  for URI with serous otitis as per above.

## 2023-12-26 ENCOUNTER — Other Ambulatory Visit: Payer: Self-pay | Admitting: Family Medicine

## 2023-12-26 ENCOUNTER — Other Ambulatory Visit: Payer: Self-pay | Admitting: Obstetrics and Gynecology

## 2023-12-26 DIAGNOSIS — N951 Menopausal and female climacteric states: Secondary | ICD-10-CM

## 2023-12-28 ENCOUNTER — Encounter: Payer: Self-pay | Admitting: Internal Medicine

## 2023-12-29 ENCOUNTER — Encounter: Payer: Self-pay | Admitting: Internal Medicine

## 2024-01-09 ENCOUNTER — Other Ambulatory Visit: Payer: Self-pay | Admitting: Internal Medicine

## 2024-01-09 ENCOUNTER — Other Ambulatory Visit: Payer: Self-pay | Admitting: Family Medicine

## 2024-01-10 ENCOUNTER — Encounter: Payer: Self-pay | Admitting: Podiatry

## 2024-01-13 ENCOUNTER — Ambulatory Visit (INDEPENDENT_AMBULATORY_CARE_PROVIDER_SITE_OTHER): Admitting: Podiatry

## 2024-01-13 ENCOUNTER — Encounter: Payer: Self-pay | Admitting: Podiatry

## 2024-01-13 VITALS — Ht 68.0 in | Wt 263.0 lb

## 2024-01-13 DIAGNOSIS — T148XXA Other injury of unspecified body region, initial encounter: Secondary | ICD-10-CM

## 2024-01-13 NOTE — Progress Notes (Signed)
 Chief Complaint  Patient presents with   Toe Injury    Pt is here due to blood blister on the tip of her left 2nd toe, states no injury, notice it Saturday and is concern with right great toe due to blister on it and toenail.    HPI: 58 y.o. female presenting for new complaint of a blood blister that developed to the distal tip of the left second toe as well as callus formation to the right great toe.  She recently had a trip to [place]  and did an excessive amount of walking and hiking.  When she returned home she noticed a blood blister to the distal tip of the toe and became concerned.  Past Medical History:  Diagnosis Date   Arthritis    Diabetes mellitus without complication (HCC)    type 2   Thyroid  disease     Past Surgical History:  Procedure Laterality Date   APPENDECTOMY  1993   CALCANEAL OSTEOTOMY Left 07/17/2016   Procedure: CALCANEAL OSTEOTOMY;  Surgeon: Kayla Pinal, MD;  Location: [facility] ORS;  Service: Orthopedics;  Laterality: Left;   COLONOSCOPY  12/18/2018   found 1 3mm polyp, going back in 5 years    COLONOSCOPY WITH PROPOFOL  N/A 12/18/2018   Procedure: COLONOSCOPY WITH PROPOFOL ;  Surgeon: Therisa Bi, MD;  Location: [facility] ENDOSCOPY;  Service: Gastroenterology;  Laterality: N/A;   IRRIGATION AND DEBRIDEMENT ABSCESS N/A 01/24/2015   Procedure: IRRIGATION AND DEBRIDEMENT ABSCESS/GROIN ABSCESS;  Surgeon: Lonni Brands, MD;  Location: [facility] ORS;  Service: General;  Laterality: N/A;   KNEE ARTHROSCOPY W/ MENISCAL REPAIR Right 2008   ROTATOR CUFF REPAIR Right 2015   TONSILLECTOMY  1998    No Known Allergies   Physical Exam: General: The patient is alert and oriented x3 in no acute distress.  Dermatology: Dry stable superficial blood blister noted to the distal tip of the left second toe.  With light debridement of the peripheral margins there is healthy underlying skin.  This should heal and resolve uneventfully.  No indication of infection. Preulcerative callus  also noted to the plantar aspect of the IPJ right great toe  Vascular: Palpable pedal pulses bilaterally. Capillary refill within normal limits.  No appreciable edema.  No erythema.  Again, capillary refill to the toes immediate.  Clinically no concern for vascular compromise or infection  Neurological: Light touch and protective threshold diminished  Musculoskeletal Exam: Mild hammertoe deformity noted to the lateral   Assessment/Plan of Care: 1.  Blood blister distal tip of the left second toe; stable which should heal uneventfully 2.  Preulcerative callus lesion plantar aspect of the right great toe  -Patient evaluated -Assured the patient that the blood blister and preulcerative callus should resolve uneventfully.  This was likely secondary to the excessive walking in [place]  -Excisional debridement of the preulcerative callus was performed today using a 312 scalpel without incident or bleeding. -Mechanical debridement of the right hallux nail plate was also performed as a courtesy for the patient -Return to clinic PRN     Thresa EMERSON Sar, DPM [facility]  Dr. Thresa EMERSON Sar, DPM    [address], [place] [address]  Office [phone]  Fax [phone]

## 2024-01-22 ENCOUNTER — Other Ambulatory Visit: Payer: Self-pay | Admitting: Obstetrics and Gynecology

## 2024-01-22 ENCOUNTER — Other Ambulatory Visit: Payer: Self-pay | Admitting: Family Medicine

## 2024-01-22 ENCOUNTER — Encounter: Payer: Self-pay | Admitting: Family Medicine

## 2024-01-22 DIAGNOSIS — N951 Menopausal and female climacteric states: Secondary | ICD-10-CM

## 2024-01-22 MED ORDER — VARENICLINE TARTRATE 1 MG PO TABS: 1.0000 mg | ORAL_TABLET | Freq: Two times a day (BID) | ORAL | 0 refills | Status: DC

## 2024-01-22 NOTE — Telephone Encounter (Signed)
 Chantix  not on current medication list.  Okay to refill?

## 2024-01-23 ENCOUNTER — Other Ambulatory Visit: Payer: Self-pay

## 2024-01-23 DIAGNOSIS — E7800 Pure hypercholesterolemia, unspecified: Secondary | ICD-10-CM

## 2024-01-23 DIAGNOSIS — E108 Type 1 diabetes mellitus with unspecified complications: Secondary | ICD-10-CM

## 2024-01-23 NOTE — Patient Instructions (Addendum)
 Thank you for allowing the Complex Care Management team to participate in your care. It was great speaking with you!  Reminder: -Please complete your appointment with Dr. Sam as scheduled on February 18, 2024. -A referral has been placed for outreach with the Embedded Pharmacist.   We will follow up on February 19, 2024 at 1230. Please do not hesitate to contact me if you require assistance prior to our next outreach.    Jackson Acron [facility] Population [facility] RN Care Manager Direct Dial : [phone]  Fax: [phone] Website: [email or web address]

## 2024-01-23 NOTE — Patient Outreach (Signed)
 Complex Care Management   Visit Note  01/23/2024  Name:  Tanya Graves MRN: [id number] DOB: [birth year]  Situation: Referral received for Complex Care Management related to Diabetes and Hyperlipidemia. I obtained verbal consent from Patient.  Visit completed with [name] via telephone.  Background:   Past Medical History:  Diagnosis Date   Arthritis    Diabetes mellitus without complication (HCC)    type 2   Thyroid  disease      Assessment: Patient Reported Symptoms: Cognitive Cognitive Status: Alert and oriented to person, place, and time, Normal speech and language skills Cognitive/Intellectual Conditions Management [RPT]: None reported or documented in medical history or problem list Health Maintenance Behaviors: Annual physical exam, Stress management, Healthy diet Healing Pattern: Average Health Facilitated by: Healthy diet, Rest  Neurological Neurological Review of Symptoms: No symptoms reported Neurological Management Strategies: Routine screening Neurological Self-Management Outcome: 4 (good)  HEENT HEENT Symptoms Reported: No symptoms reported HEENT Management Strategies: Routine screening HEENT Self-Management Outcome: 4 (good)  Cardiovascular Cardiovascular Symptoms Reported: No symptoms reported Does patient have uncontrolled Hypertension?: No Cardiovascular Self-Management Outcome: 4 (good)  Respiratory Respiratory Symptoms Reported: Other: Other Respiratory Symptoms: Reports episodes of congestion since returning from [place]  Respiratory Self-Management Outcome: 4 (good)  Endocrine Endocrine Symptoms Reported: No symptoms reported Is patient diabetic?: Yes Is patient checking blood sugars at home?: Yes  Gastrointestinal Gastrointestinal Symptoms Reported: No symptoms reported  Genitourinary Genitourinary Symptoms Reported: No symptoms reported  Integumentary Integumentary Symptoms Reported: No symptoms reported Additional Integumentary Details: Reports being  evaluated for a blister to her feet after returning from [place] . Reports blister has healed. Skin Self-Management Outcome: 4 (good)  Musculoskeletal Musculoskelatal Symptoms Reviewed: No symptoms reported Other Musculoskeletal Symptoms: Reports pending right elbow MRI. Reports possible plan for use of a bone growth stimulator.  Psychosocial Psychosocial Symptoms Reported: No symptoms reported   There were no vitals filed for this visit.  Medications Reviewed Today     Reviewed by Karoline Lima, RN (Registered Nurse) on 01/23/24 at 1259  Med List Status: <None>   Medication Order Taking? Sig Documenting Provider Last Dose Status Informant  albuterol  (VENTOLIN  HFA) 108 (90 Base) MCG/ACT inhaler [id number]  INHALE 2 PUFFS BY MOUTH EVERY 6 HOURS AS NEEDED FOR WHEEZING OR SHORTNESS OF BREATH Avelina Greig BRAVO, MD  Active Self, Pharmacy Records           Med Note [facility], MELISSA A   Tue Nov 11, 2023  7:51 AM)    BAQSIMI  ONE PACK 3 MG/DOSE POWD [id number]  PLACE 1 DEVICE INTO THE NOSE AS DIRECTED Shamleffer, Donell Cardinal, MD  Active Self, Pharmacy Records           Med Note [facility], MELISSA A   Tue Nov 11, 2023  7:51 AM)    Continuous Glucose Sensor (DEXCOM G7 SENSOR) MISC [id number]  1 Device by Does not apply route as directed. Shamleffer, Donell Cardinal, MD  Active Self, Pharmacy Records  CONTOUR NEXT TEST test strip [id number]  USE TO TEST BLOOD SUGAR 6 TIMES A DAY Shamleffer, Donell Cardinal, MD  Active   estradiol  (ESTRACE ) 0.1 MG/GM vaginal cream [id number]  PLACE 1/4 APPLICATORFUL VAGINALLY AT BEDTIME Janit Alm Agent, MD  Active   insulin  aspart (NOVOLOG ) 100 UNIT/ML injection [id number]  INJECT A MAXIMUM OF 60 UNITS UNDER THE SKIN DAILY  Patient taking differently: Inject 50 Units into the skin as directed.   Shamleffer, Donell Cardinal, MD  Active Self, Pharmacy Records  Insulin  Disposable Pump (OMNIPOD 5 G7 PODS,  GEN 5,) MISC [id number]  1 Device by Does not apply route every other  day. Shamleffer, Donell Cardinal, MD  Active Self, Pharmacy Records  levothyroxine  (SYNTHROID ) 200 MCG tablet [id number]  Take 1 tablet (200 mcg total) by mouth daily. Shamleffer, Donell Cardinal, MD  Active Self, Pharmacy Records  meloxicam  (MOBIC ) 15 MG tablet [id number]  TAKE 1 TABLET BY MOUTH DAILY  Patient taking differently: Take 15 mg by mouth daily as needed for pain.   Janit Thresa HERO, DPM  Active Self, Pharmacy Records  rosuvastatin  (CRESTOR ) 40 MG tablet [id number]  TAKE 1 TABLET BY MOUTH DAILY Shamleffer, Ibtehal Jaralla, MD  Active Self, Pharmacy Records  tirzepatide  (MOUNJARO ) 7.5 MG/0.5ML Pen [id number]  Inject 7.5 mg into the skin once a week. Shamleffer, Donell Cardinal, MD  Active Self, Pharmacy Records  tiZANidine (ZANAFLEX) 4 MG tablet [id number] Yes Take 4 mg by mouth 3 (three) times daily as needed for muscle spasms. [provider]  Active Self, Pharmacy Records  varenicline  (CHANTIX  CONTINUING MONTH PAK) 1 MG tablet [id number]  Take 1 tablet (1 mg total) by mouth 2 (two) times daily. Avelina Greig BRAVO, MD  Active             Recommendation:   Continue Current Plan of Care  Follow Up Plan:   Telephone follow up appointment with Nurse Case Manager on February 19, 2024   Jackson Acron [facility] RN Care Manager Direct Dial : [phone]  Fax: [phone] Website: [email or web address]

## 2024-01-29 ENCOUNTER — Telehealth: Payer: Self-pay

## 2024-01-29 NOTE — Progress Notes (Signed)
 Care Guide Pharmacy Note  01/29/2024 Name: Tanya Graves MRN: [id number] DOB: 20-Apr-1966  Referred By: Avelina Greig BRAVO, MD Reason for referral: Complex Care Management and Call Attempt #1 (Successful initial outreach scheduled with PHARM D- Manuelita)   Tanya Graves is a 58 y.o. year old female who is a primary care patient of Avelina Greig BRAVO, MD.  Tanya Graves was referred to the pharmacist for assistance related to: Type I DM for medication assistance.  Successful contact was made with the patient to discuss pharmacy services including being ready for the pharmacist to call at least 5 minutes before the scheduled appointment time and to have medication bottles and any blood pressure readings ready for review. The patient agreed to meet with the pharmacist via telephone visit on (date/time). 02/05/24 @ 10 AM.  [facility]  [facility] Guide  Direct Dial : [phone]  Fax [phone]

## 2024-02-02 ENCOUNTER — Other Ambulatory Visit: Payer: Self-pay | Admitting: Internal Medicine

## 2024-02-02 ENCOUNTER — Other Ambulatory Visit: Payer: Self-pay | Admitting: Podiatry

## 2024-02-03 ENCOUNTER — Other Ambulatory Visit: Payer: Self-pay | Admitting: Internal Medicine

## 2024-02-04 ENCOUNTER — Other Ambulatory Visit: Payer: Self-pay | Admitting: Student

## 2024-02-04 DIAGNOSIS — S52121K Displaced fracture of head of right radius, subsequent encounter for closed fracture with nonunion: Secondary | ICD-10-CM

## 2024-02-05 ENCOUNTER — Other Ambulatory Visit: Payer: Self-pay

## 2024-02-05 ENCOUNTER — Other Ambulatory Visit (INDEPENDENT_AMBULATORY_CARE_PROVIDER_SITE_OTHER): Admitting: Pharmacist

## 2024-02-05 ENCOUNTER — Encounter: Payer: Self-pay | Admitting: Internal Medicine

## 2024-02-05 DIAGNOSIS — E139 Other specified diabetes mellitus without complications: Secondary | ICD-10-CM

## 2024-02-05 MED ORDER — DEXCOM G7 SENSOR MISC: 1.0000 | 3 refills | Status: DC

## 2024-02-05 MED ORDER — COVID-19 MRNA VAC-TRIS(PFIZER) 30 MCG/0.3ML IM SUSY: 0.3000 mL | PREFILLED_SYRINGE | Freq: Once | INTRAMUSCULAR | 0 refills | Status: AC

## 2024-02-05 NOTE — Patient Instructions (Signed)
 [name],   It was a pleasure to speak with you today! As we discussed:?   As of this morning, [name] told me that they are ordering your Dexcom sensors. Consider checking in with them tomorrow afternoon if you have not received a ready message.  I reviewed all current prescriptions, and it appears most are written for 90 days. I would ask [name] to dispense 90 days. They will be able to tell you if the insurance does not allow this (though since you are already getting some for 90 days, this is a good sign!)  CDC Vaccination Schedule: Vaccine schedule for Adults 3-52 years old Influenza - due if not received for 2025 (can get at [facility] or in office) Covid - Prescription sent to Dr. Avelina to sign. This will go to [facility]. Tdap booster every 10 years (Last received 03/25/2019) Shingrix (completed 2021). No further doses needed.  Pneumococcal (Your last PPSV23 06/2018). I do not see history of PCV (newer pneumo vaccine). You may confirm with [name] that you have not received. If your only vaccine was the PPSV23, you are due for a final dose of PCV20.  Adults with diabetes RSV for individuals at high risk of severe complication. The American Diabetes Association does recommend RSV (healthy adults can get this at 37, your insurance may cover it sooner given the diabetes diagnosis).  Hepatitis B Booster dose is not typically required if you completed the usual series prior.   Please reach out prior to your next scheduled appointment should you have any questions or concerns.  You may respond directly to this message, or leave me a voicemail at [phone] and I will get back to you shortly.   Thank you!   Future Appointments  Date Time Provider Department Center  02/12/2024  2:40 PM [facility] [place] [facility] 1 [facility]  02/18/2024  2:00 PM Shamleffer, Donell Cardinal, MD [facility] None  02/19/2024 12:30 PM Karoline Lima, RN [facility] None     Tanya Graves, PharmD Clinical Pharmacist [facility] [phone]

## 2024-02-05 NOTE — Progress Notes (Signed)
   02/05/2024 Name: Tanya Graves MRN: [id number] DOB: Feb 01, 1966  Subjective  Chief Complaint  Patient presents with   Diabetes   Medication Access    Care Team: Primary Care Provider: Avelina Greig BRAVO, MD  Reason for visit: ?  Tanya Graves is a 58 y.o. female who presents today for a telephone visit with the pharmacist due to medication access concerns regarding their diabetes supplies including CGM and Omnipod. ?    Assessment and Plan:   1. Medication Access Dexcom G7, approved. [facility] claim at [facility] shows copay of $0.  Omnipod refill too soon. Last filled 02/03/24.  Confirmed with [name] both prescriptions are covered. Omnipod was picked up yesterday, Dexcom is being ordered for tomorrow.  Discussed patient questions in detail regarding T2D vs Type1/LADA.    2. Covid vaccine  Patient reports [name] requires prescription and she would like this sent in if possible. Should be eligible for vaccine given chronic conditions.  Rx pended to PCP for 2025-2026 Covid vaccine per patient request   3. Vaccination Schedule CDC Vaccination Schedule: Vaccine schedule for Adults 17-57 years old Influenza - due if not received for 2025 (can get at [employer] or in office) Covid - Prescription sent to Dr. Avelina to sign. This will go to [facility]. Tdap booster every 10 years (Last received 03/25/2019) Shingrix (completed 2021). No further doses needed.  Pneumococcal (Your last PPSV23 06/2018). I do not see history of PCV (newer pneumo vaccine). You may confirm with [name] that you have not received. If your only vaccine was the PPSV23, you are due for a final dose of PCV20.  Adults with diabetes RSV for individuals at high risk of severe complication. The American Diabetes Association does recommend RSV (healthy adults can get this at 61, your insurance may cover it sooner given the diabetes diagnosis).  Hepatitis B Booster dose is not typically required if you  completed the usual series prior.   Future Appointments  Date Time Provider Department Center  02/12/2024  2:40 PM [facility] [place] [facility] 1 [facility]  02/18/2024  2:00 PM Shamleffer, Donell Cardinal, MD [facility] None  02/19/2024 12:30 PM Karoline Lima, RN [facility] None    Manuelita FABIENE Kobs, PharmD Clinical Pharmacist [facility] [phone]

## 2024-02-12 ENCOUNTER — Other Ambulatory Visit

## 2024-02-16 ENCOUNTER — Other Ambulatory Visit

## 2024-02-17 ENCOUNTER — Encounter: Payer: Self-pay | Admitting: Internal Medicine

## 2024-02-18 ENCOUNTER — Ambulatory Visit: Admitting: Internal Medicine

## 2024-02-18 ENCOUNTER — Other Ambulatory Visit

## 2024-02-19 ENCOUNTER — Other Ambulatory Visit: Payer: Self-pay

## 2024-02-21 ENCOUNTER — Other Ambulatory Visit

## 2024-02-23 ENCOUNTER — Encounter: Payer: Self-pay | Admitting: Family Medicine

## 2024-02-23 DIAGNOSIS — R7401 Elevation of levels of liver transaminase levels: Secondary | ICD-10-CM

## 2024-02-23 NOTE — Patient Outreach (Signed)
 Please Disregard: Ms. Longwell answered call at scheduled time. Reports assisting daughter at time of call. Will complete outreach at a later time.

## 2024-02-25 NOTE — Telephone Encounter (Signed)
Form printed and placed in Dr. Rometta Emery in box to complete.

## 2024-02-25 NOTE — Telephone Encounter (Signed)
 Please print out the forms she is attached for [employer]... so I can complete.  Also okay to refill the ondansetron . #30, 1 RF  Please place order for hepatic panel dx elevated LFTs. I would have done this but this is in her mother's chart not the daughter.

## 2024-02-29 ENCOUNTER — Other Ambulatory Visit

## 2024-03-07 ENCOUNTER — Ambulatory Visit
Admission: RE | Admit: 2024-03-07 | Discharge: 2024-03-07 | Disposition: A | Discharge status: Home / Self Care | Source: Ambulatory Visit | Attending: Student | Admitting: Student

## 2024-03-07 DIAGNOSIS — S52121K Displaced fracture of head of right radius, subsequent encounter for closed fracture with nonunion: Secondary | ICD-10-CM

## 2024-03-15 ENCOUNTER — Other Ambulatory Visit: Payer: Self-pay | Admitting: Family Medicine

## 2024-03-15 ENCOUNTER — Other Ambulatory Visit: Payer: Self-pay | Admitting: Internal Medicine

## 2024-03-15 DIAGNOSIS — R062 Wheezing: Secondary | ICD-10-CM

## 2024-03-25 ENCOUNTER — Ambulatory Visit: Payer: Self-pay

## 2024-03-25 NOTE — Telephone Encounter (Signed)
 FYI Only or Action Required?: FYI only for provider: appointment scheduled on 03/26/24.  Patient was last seen in primary care on 12/17/2023 by Rilla Baller, MD.  Called Nurse Triage reporting Stress and Anxiety.  Symptoms began today.  Interventions attempted: Other: Talking with friends and family.  Symptoms are: stable.  Triage Disposition: See PCP When Office is Open (Within 3 Days)  Patient/caregiver understands and will follow disposition?: Yes  Copied from CRM #[id number]. Topic: Clinical - Red Word Triage >> Mar 25, 2024 11:05 AM Thersia BROCKS wrote: [facility] that prompted transfer to Nurse Triage: Patient called in regarding she is under a lot of stress , stated she doesn't know what to do and who to call regarding this    ----------------------------------------------------------------------- From previous Reason for Contact - Scheduling: Patient/patient representative is calling to schedule an appointment. Refer to attachments for appointment information. Reason for Disposition  MODERATE anxiety (e.g., persistent or frequent anxiety symptoms; interferes with sleep, school, or work)  Answer Assessment - Initial Assessment Questions Pt considering disability during period of stress r/t work and daughters health issues. Denies SI, reports she is still able to go about daily activities and care for herself and her daughter. No appt in office with PCP until next Tuesday. Called [employer] and spoke with Emmie, pt unable to be worked in today. Scheduled with different provider at same office for tomorrow. Provided pt with [phone] number and information regarding short term disability and SSI for Children.  1. CONCERN: Did anything happen that prompted you to call today?      Daughter has been having health issues, work stress. Feeling overwhelmed.  2. ANXIETY SYMPTOMS: Can you describe how you (your loved one; patient) have been feeling? (e.g., tense, restless, panicky, anxious, keyed up,  overwhelmed, sense of impending doom).      Overwhelmed  3. ONSET: How long have you been feeling this way? (e.g., hours, days, weeks)     Worse this morning due to stress from work.  4. SEVERITY: How would you rate the level of anxiety? (e.g., 0 - 10; or mild, moderate, severe).     Moderate  5. FUNCTIONAL IMPAIRMENT: How have these feelings affected your ability to do daily activities? Have you had more difficulty than usual doing your normal daily activities? (e.g., getting better, same, worse; self-care, school, work, interactions)     Still able to go about daily activities and care self  6. HISTORY: Have you felt this way before? Have you ever been diagnosed with an anxiety problem in the past? (e.g., generalized anxiety disorder, panic attacks, PTSD). If Yes, ask: How was this problem treated? (e.g., medicines, counseling, etc.)     *No Answer*  7. RISK OF HARM - SUICIDAL IDEATION: Do you ever have thoughts of hurting or killing yourself? If Yes, ask:  Do you have these feelings now? Do you have a plan on how you would do this?     No  8. TREATMENT:  What has been done so far to treat this anxiety? (e.g., medicines, relaxation strategies). What has helped?     Talking with friends and family  67. THERAPIST: Do you have a counselor or therapist? If Yes, ask: What is their name?     No  10. POTENTIAL TRIGGERS: Do you drink caffeinated beverages (e.g., coffee, colas, teas), and how much daily? Do you drink alcohol or use any drugs? Have you started any new medicines recently?       Work stress, daughter currently  going through health challenges  11. PATIENT SUPPORT: Who is with you now? Who do you live with? Do you have family or friends who you can talk to?        Has friends and family for support  22. OTHER SYMPTOMS: Do you have any other symptoms? (e.g., feeling depressed, trouble concentrating, trouble sleeping, trouble breathing,  palpitations or fast heartbeat, chest pain, sweating, nausea, or diarrhea)       Pt talking in clear full coherent sentences, expresses needing to take time off work and feeling overwhelmed. No dyspnea noted over the phone.  Protocols used: Anxiety and Panic Attack-A-AH

## 2024-03-25 NOTE — Telephone Encounter (Signed)
 Patient scheduled to see Sheela Roys tomorrow at 8:20 am.

## 2024-03-26 ENCOUNTER — Encounter: Payer: Self-pay | Admitting: Family

## 2024-03-26 ENCOUNTER — Ambulatory Visit (INDEPENDENT_AMBULATORY_CARE_PROVIDER_SITE_OTHER): Admitting: Family

## 2024-03-26 VITALS — BP 138/82 | HR 84 | Temp 98.3°F | Ht 68.0 in | Wt 268.5 lb

## 2024-03-26 DIAGNOSIS — Z566 Other physical and mental strain related to work: Secondary | ICD-10-CM | POA: Diagnosis not present

## 2024-03-26 DIAGNOSIS — Z6379 Other stressful life events affecting family and household: Secondary | ICD-10-CM

## 2024-03-26 DIAGNOSIS — F430 Acute stress reaction: Secondary | ICD-10-CM | POA: Diagnosis not present

## 2024-03-26 MED ORDER — CLONAZEPAM 0.5 MG PO TABS
0.5000 mg | ORAL_TABLET | Freq: Two times a day (BID) | ORAL | 0 refills | Status: AC | PRN
Start: 2024-03-26 — End: ?

## 2024-03-26 MED ORDER — BUPROPION HCL ER (XL) 150 MG PO TB24: 150.0000 mg | ORAL_TABLET | Freq: Every day | ORAL | 3 refills | Status: DC

## 2024-03-26 NOTE — Progress Notes (Signed)
 Acute Office Visit  Subjective:     Patient ID: Tanya Graves, female    DOB: 10/13/1965, 58 y.o.   MRN: [id number]  Chief Complaint  Patient presents with  . Anxiety    C/o anxiety and stress due to ill daughter and word environment. Started about 2 mos ago.     HPI Patient is in today with c/o increased anxiety and stress related to having a sick daughter who is 54 years old. Daughter has elevated LFTs in the 4000 range, nausea and vomiting with unknown etiology and being seen by [facility] over the last month. She adopted her daughter about 3 years ago and does not have any family history on her. As a results, she has missed several days at work and concerned that she may lose her job. She reports a change in leadership at work that has already created stress for her and many others. She is unsure of how to proceed but needs to be able to keep her job and benefits. Reports her HR department suggest that she file FMLA and drop down to 20 hours per week and do 20 hours of short-term disability. She has symptoms of crying, anxiousness, sadness, and worry. She denies and hopelessness, no thoughts of death or dying.   Review of Systems  Constitutional: Negative.   Respiratory: Negative.    Cardiovascular: Negative.   Neurological: Negative.   Psychiatric/Behavioral:  Positive for depression. The patient is nervous/anxious and has insomnia.   All other systems reviewed and are negative.  Past Medical History:  Diagnosis Date  . Arthritis   . Diabetes mellitus without complication (HCC)    type 2  . Thyroid  disease     Social History   Socioeconomic History  . Marital status: Single    Spouse name: Not on file  . Number of children: 0  . Years of education: Associates  . Highest education level: Associate degree: academic program  Occupational History  . Not on file  Tobacco Use  . Smoking status: Former    Current packs/day: 0.25    Average packs/day: 0.3 packs/day for 31.2  years (7.8 ttl pk-yrs)    Types: Cigarettes    Start date: 05/10/2022  . Smokeless tobacco: Never  . Tobacco comments:    Reports improvements since starting Varenicline . Currently smoking less than a pack a week.  Vaping Use  . Vaping status: Former  Substance and Sexual Activity  . Alcohol use: Not Currently    Comment: a couple of times a month, typically less than 2 drinks  . Drug use: No  . Sexual activity: Not Currently    Birth control/protection: Post-menopausal  Other Topics Concern  . Not on file  Social History Narrative   Lives alone   Dog - Sugar   Enjoys: hiking - looking for local trails   Exercise: walking trails with dog   Diet: doing well, did weight watchers, watching carbs   Social Drivers of Health   Financial Resource Strain: Low Risk  (03/25/2024)   Overall Financial Resource Strain (CARDIA)   . Difficulty of Paying Living Expenses: Not very hard  Food Insecurity: No Food Insecurity (03/25/2024)   Hunger Vital Sign   . Worried About Programme Researcher, Broadcasting/film/video in the Last Year: Never true   . Ran Out of Food in the Last Year: Never true  Transportation Needs: No Transportation Needs (03/25/2024)   PRAPARE - Transportation   . Lack of Transportation (Medical): No   .  Lack of Transportation (Non-Medical): No  Physical Activity: Sufficiently Active (03/25/2024)   Exercise Vital Sign   . Days of Exercise per Week: 3 days   . Minutes of Exercise per Session: 60 min  Stress: Stress Concern Present (03/25/2024)   [employer] of Occupational Health - Occupational Stress Questionnaire   . Feeling of Stress: Very much  Social Connections: Moderately Integrated (03/25/2024)   Social Connection and Isolation Panel   . Frequency of Communication with Friends and Family: More than three times a week   . Frequency of Social Gatherings with Friends and Family: More than three times a week   . Attends Religious Services: More than 4 times per year   . Active Member  of Clubs or Organizations: Yes   . Attends Banker Meetings: More than 4 times per year   . Marital Status: Divorced  Intimate Partner Violence: Unknown (11/11/2023)   Humiliation, Afraid, Rape, and Kick questionnaire   . Fear of Current or Ex-Partner: No   . Emotionally Abused: No   . Physically Abused: Not on file   . Sexually Abused: Not on file    Past Surgical History:  Procedure Laterality Date  . APPENDECTOMY  1993  . CALCANEAL OSTEOTOMY Left 07/17/2016   Procedure: CALCANEAL OSTEOTOMY;  Surgeon: Kayla Pinal, MD;  Location: [facility] ORS;  Service: Orthopedics;  Laterality: Left;  . COLONOSCOPY  12/18/2018   found 1 3mm polyp, going back in 5 years   . COLONOSCOPY WITH PROPOFOL  N/A 12/18/2018   Procedure: COLONOSCOPY WITH PROPOFOL ;  Surgeon: Therisa Bi, MD;  Location: [facility] ENDOSCOPY;  Service: Gastroenterology;  Laterality: N/A;  . IRRIGATION AND DEBRIDEMENT ABSCESS N/A 01/24/2015   Procedure: IRRIGATION AND DEBRIDEMENT ABSCESS/GROIN ABSCESS;  Surgeon: Lonni Brands, MD;  Location: [facility] ORS;  Service: General;  Laterality: N/A;  . KNEE ARTHROSCOPY W/ MENISCAL REPAIR Right 2008  . ROTATOR CUFF REPAIR Right 2015  . TONSILLECTOMY  1998    Family History  Problem Relation Age of Onset  . Diabetes Father   . Heart failure Father   . Diabetes Brother   . Arthritis Mother   . Diabetes Mother   . Transient ischemic attack Mother   . Heart failure Maternal Grandmother   . Alcohol abuse Maternal Grandmother   . Diabetes Paternal Grandmother   . Kidney failure Paternal Grandmother   . Heart failure Paternal Grandfather   . Cancer Paternal Grandfather        unknown type  . Hypertension Brother   . Breast cancer Maternal Great-grandmother     No Known Allergies  Current Outpatient Medications on File Prior to Visit  Medication Sig Dispense Refill  . albuterol  (VENTOLIN  HFA) 108 (90 Base) MCG/ACT inhaler INHALE 2 PUFFS BY MOUTH EVERY 6 HOURS AS NEEDED FOR  WHEEZING OR SHORTNESS OF BREATH 8.5 g 2  . BAQSIMI  ONE PACK 3 MG/DOSE POWD PLACE 1 DEVICE INTO THE NOSE AS DIRECTED 1 each 3  . Continuous Glucose Sensor (DEXCOM G7 SENSOR) MISC 1 Device by Does not apply route as directed. 9 each 3  . CONTOUR NEXT TEST test strip USE TO TEST BLOOD SUGAR 6 TIMES A DAY 100 strip 3  . insulin  aspart (NOVOLOG ) 100 UNIT/ML injection INJECT A MAXIMUM OF 60 UNITS UNDER THE SKIN DAILY 50 mL 1  . Insulin  Disposable Pump (OMNIPOD 5 G7 PODS, GEN 5,) MISC 1 Device by Does not apply route every other day. 45 each 3  . levothyroxine  (SYNTHROID ) 200 MCG tablet TAKE  1 TABLET BY MOUTH DAILY 90 tablet 1  . meloxicam  (MOBIC ) 15 MG tablet TAKE 1 TABLET BY MOUTH DAILY 30 tablet 2  . rosuvastatin  (CRESTOR ) 40 MG tablet TAKE 1 TABLET BY MOUTH DAILY 90 tablet 3  . tirzepatide  (MOUNJARO ) 7.5 MG/0.5ML Pen Inject 7.5 mg into the skin once a week. 6 mL 3  . tiZANidine (ZANAFLEX) 4 MG tablet Take 4 mg by mouth 3 (three) times daily as needed for muscle spasms.    . varenicline  (CHANTIX  CONTINUING MONTH PAK) 1 MG tablet Take 1 tablet (1 mg total) by mouth 2 (two) times daily. (Patient taking differently: Take 1 mg by mouth 2 (two) times daily. Takes once a day) 180 tablet 0   No current facility-administered medications on file prior to visit.    BP 138/82   Pulse 84   Temp 98.3 F (36.8 C) (Oral)   Ht 5' 8 (1.727 m)   Wt 268 lb 8 oz (121.8 kg)   LMP 06/28/2015 (Approximate)   SpO2 98%   BMI 40.83 kg/m chart      Objective:    BP 138/82   Pulse 84   Temp 98.3 F (36.8 C) (Oral)   Ht 5' 8 (1.727 m)   Wt 268 lb 8 oz (121.8 kg)   LMP 06/28/2015 (Approximate)   SpO2 98%   BMI 40.83 kg/m    Physical Exam Vitals reviewed.  Constitutional:      Appearance: Normal appearance. She is obese.  Cardiovascular:     Rate and Rhythm: Normal rate and regular rhythm.  Pulmonary:     Effort: Pulmonary effort is normal.     Breath sounds: Normal breath sounds.  Musculoskeletal:         General: Normal range of motion.     Cervical back: Normal range of motion and neck supple.  Skin:    General: Skin is warm and dry.  Neurological:     General: No focal deficit present.     Mental Status: She is alert and oriented to person, place, and time. Mental status is at baseline.  Psychiatric:        Thought Content: Thought content normal.     Comments: Very tearful and anxious    No results found for any visits on 03/26/24.      Assessment & Plan:   Problem List Items Addressed This Visit   None Visit Diagnoses       Stress reaction    -  Primary   Relevant Medications   buPROPion (WELLBUTRIN XL) 150 MG 24 hr tablet     Stressful workplace         Stress due to illness of family member           Meds ordered this encounter  Medications  . buPROPion (WELLBUTRIN XL) 150 MG 24 hr tablet    Sig: Take 1 tablet (150 mg total) by mouth daily.    Dispense:  30 tablet    Refill:  3  . clonazePAM (KLONOPIN) 0.5 MG tablet    Sig: Take 1 tablet (0.5 mg total) by mouth 2 (two) times daily as needed for anxiety.    Dispense:  60 tablet    Refill:  0   Encouraged a veterinary surgeon. Start Wellbutrin XL and Klonopin temporarily/prn to help with anxiety as she adjusts to Wellbutrin. Call with any questions or concerns. Recheck with PCP in 3 weeks and sooner as needed.  Return in about 3 weeks (around 04/16/2024).  Sameen Leas B Aunika Kirsten, FNP

## 2024-03-30 ENCOUNTER — Telehealth: Payer: Self-pay

## 2024-03-30 ENCOUNTER — Telehealth: Payer: Self-pay | Admitting: Family Medicine

## 2024-03-30 NOTE — Telephone Encounter (Signed)
 I have not received any paperwork for this patient as of today.

## 2024-03-30 NOTE — Telephone Encounter (Signed)
 Patient calling asking if FMLA ppwk is completed did not see note as to when it was dropped off. Patient states it was given to provider Douglass when she was here on Friday.

## 2024-03-30 NOTE — Telephone Encounter (Signed)
 Copied from CRM #[id number]. 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 Pt is calling her HR dept. Is calling and wants the [facility] paperwork. Pt is calling wanting to know if the paperwork is ready. Please reach out to pt in regards to [facility] paperwork.  Emailed paperwork sent too  [email or web address] on 10/30 or 10/31  Call back # for pt  [phone]

## 2024-03-30 NOTE — Telephone Encounter (Signed)
 The patient will be emailing the forms to our office for the provider to fill out. They were not given to provider Douglass on 10.31.25.   When forms are received I will place in providers box for review.

## 2024-04-05 ENCOUNTER — Telehealth: Payer: Self-pay | Admitting: Family Medicine

## 2024-04-05 NOTE — Telephone Encounter (Signed)
 Patient state emailed paperwork to office advised not received for completion

## 2024-04-05 NOTE — Telephone Encounter (Signed)
 Received FMLA ppwk via email printed place in box for completion at front desk

## 2024-04-05 NOTE — Telephone Encounter (Signed)
 Left message to return call to office. Need to follow up regarding FMLA/disability forms.

## 2024-04-06 DIAGNOSIS — Z0279 Encounter for issue of other medical certificate: Secondary | ICD-10-CM

## 2024-04-06 NOTE — Telephone Encounter (Signed)
 FMLA for Patient forms received for completion for patient. Patient has been informed that process may take up to 5 business days.  Employer Name [employer] Reason for being out: Anxiety Any inpatient care:N/A Any Planned appointment? 11.21.25 Patient is requesting start date of 11.1.25 Patient is requesting end date of 5.1.26  Patient states she can work 20 hrs a week at this time and is requesting 2-3 x a week for up to 8 hrs for intermittent leave.  Verified with patient that it is ok to leave Voicemail updates on  Mobile [phone] (mobile)  Patient would like to pick up copy in our office when form is completed.   Fax number form should be sent to is [phone] atttn to [facility]  Patient will pick up her copy at the front desk at her appointment with the provider on 11.21.25.  Forms placed in providers box for review.

## 2024-04-06 NOTE — Telephone Encounter (Addendum)
 Completed forms received and faxed to Lorie Best at [phone]  Copy sent to scan  Copy will be picked up by patient at her appointment.  Pt notified via MyChart that forms are complete and faxed

## 2024-04-12 ENCOUNTER — Encounter: Payer: Self-pay | Admitting: Internal Medicine

## 2024-04-12 ENCOUNTER — Other Ambulatory Visit

## 2024-04-12 ENCOUNTER — Ambulatory Visit (INDEPENDENT_AMBULATORY_CARE_PROVIDER_SITE_OTHER): Admitting: Internal Medicine

## 2024-04-12 VITALS — BP 110/68 | Ht 68.0 in | Wt 271.0 lb

## 2024-04-12 DIAGNOSIS — E890 Postprocedural hypothyroidism: Secondary | ICD-10-CM

## 2024-04-12 DIAGNOSIS — E785 Hyperlipidemia, unspecified: Secondary | ICD-10-CM | POA: Diagnosis not present

## 2024-04-12 DIAGNOSIS — E1169 Type 2 diabetes mellitus with other specified complication: Secondary | ICD-10-CM

## 2024-04-12 DIAGNOSIS — E1069 Type 1 diabetes mellitus with other specified complication: Secondary | ICD-10-CM

## 2024-04-12 DIAGNOSIS — E1065 Type 1 diabetes mellitus with hyperglycemia: Secondary | ICD-10-CM

## 2024-04-12 LAB — POCT GLYCOSYLATED HEMOGLOBIN (HGB A1C): Hemoglobin A1C: 7.8 % — AB (ref 4.0–5.6)

## 2024-04-12 MED ORDER — DEXCOM G7 SENSOR MISC: 1.0000 | 3 refills | Status: DC

## 2024-04-12 MED ORDER — OMNIPOD 5 G7 PODS (GEN 5) MISC: 1.0000 | 3 refills | Status: DC

## 2024-04-12 MED ORDER — TIRZEPATIDE 7.5 MG/0.5ML ~~LOC~~ SOAJ: 7.5000 mg | SUBCUTANEOUS | 3 refills | Status: DC

## 2024-04-12 MED ORDER — INSULIN ASPART 100 UNIT/ML IJ SOLN: INTRAMUSCULAR | 3 refills | Status: DC

## 2024-04-12 NOTE — Patient Instructions (Signed)
 Enter 14 grams of carbohydrates with each meal      HOW TO TREAT LOW BLOOD SUGARS (Blood sugar LESS THAN 70 MG/DL) Please follow the RULE OF 15 for the treatment of hypoglycemia treatment (when your (blood sugars are less than 70 mg/dL)   STEP 1: Take 15 grams of carbohydrates when your blood sugar is low, which includes:  3-4 GLUCOSE TABS  OR 3-4 OZ OF JUICE OR REGULAR SODA OR ONE TUBE OF GLUCOSE GEL    STEP 2: RECHECK blood sugar in 15 MINUTES STEP 3: If your blood sugar is still low at the 15 minute recheck --> then, go back to STEP 1 and treat AGAIN with another 15 grams of carbohydrates.

## 2024-04-12 NOTE — Progress Notes (Unsigned)
 Name: Tanya Graves  Age/ Sex: 58 y.o., female   MRN/ DOB: [id number], [birth year]     PCP: Avelina Greig BRAVO, MD   Reason for Endocrinology Evaluation: Type 2 Diabetes Mellitus  Initial Endocrine Consultative Visit: 07/08/2018    PATIENT IDENTIFIER: [name] is a 58 y.o. female with a past medical history of T2DM, HTN, Dyslipidemia and hypothyroidism . The patient has followed with Endocrinology clinic since 07/08/2018 for consultative assistance with management of her diabetes.  DIABETIC HISTORY:  Tanya Graves was diagnosed with T2DM ~ 2010.  She does not recall intolerance to other oral glycemic agents.  On her initial visit here she was on MDI regimen of insulin  and metformin . Her hemoglobin A1c has ranged from 11.4%  in 2016, peaking at 12.5% in 2019  GLP-1 and SGLT-2 inhibitors have been cost prohibitive in the past    She has been off metformin  since October 2022  DKA 12/2021  Her diagnosis was changed to Type 1 DM with an elevated GAD-65 > 250  ( <5IU/mL and islet Ab 80 ( reference <1.25)  I prescribed mounjaro  before her LADA Dx but this was discontinued after hospitalization for DKA in 04/2022 and we opted to start Ozempic  07/2022  Intolerant to Ozempic -GI side effects  Mounjaro  was not covered due to being T1 DM   Thyroid  Disease   She is S/P RAI ablation years ago > 20 yrs ago and has been on levothyroxine  . She is on Levothyroxine  175 mcg daily   SUBJECTIVE:   During the last visit (11/18/2023): A1c 7.9% .     Today (04/12/2024): Tanya Graves for a follow-up on diabetes management.  She checks her glucose multiple times a day through CGM  She continues to follow-up with orthopedics for radial fracture that occurred in May, 2025.  Tingling and numbness of the hand continue to improve She was evaluated by podiatry in August, 2025 Unfortunately, her 77 year old daughter has been hospitalized multiple times due to elevated LFTs > 4000's which has caused multiple  stress to the patient, she is also having employment issues, with her employer and reducing her working hours to 20hrs/week  She is currently on bupropion and clonazepam She does follow-up with a therapist No nausea or vomiting , she did have mild nausea initially on Mounjaro , but this has resolved No constipation or diarrhea    This patient with type 1 diabetes is treated with Omnipod (insulin  pump). During the visit the pump basal and bolus doses were reviewed including carb/insulin  rations and supplemental doses. The clinical list was updated. The glucose meter download was reviewed in detail to determine if the current pump settings are providing the best glycemic control without excessive hypoglycemia.  Pump and meter download:    Pump   Omnipod Settings   Insulin  type   Novolog    Basal rate       0000 2.4 u/h               I:C ratio       0000 1:1    # 6, 10,14 g               Sensitivity       0000  35      Goal       0000  120          PUMP STATISTICS: Average BG: 211 Average Daily Carbs (g): 20.7 Average Total Daily Insulin :66.9 Average Daily Basal: 44.4(56 %) Average Daily  Bolus: 22.5 (34 %)     CONTINUOUS GLUCOSE MONITORING RECORD INTERPRETATION    Dates of Recording: 11/1-11/14/2025  Sensor description:dexcom  Results statistics:   CGM use % of time 97  Average and SD 217/92  Time in range 40%  % Time Above 180 26  % Time above 250 33  % Time Below target 1   Glycemic patterns summary: BGs trend down to optimal overnight and increased throughout the day  Hyperglycemic episodes  postprandial   Hypoglycemic episodes occurred after bolus Overnight periods: Trends down to optimal     HOME DIABETES REGIMEN:  Novolog   Mounjaro  7.5 mg weekly  Rosuvastatin  40 mg daily      DIABETIC COMPLICATIONS: Microvascular complications:  Neuropathy , left worsening DR Denies: CKD  Last eye exam: Completed 09/15/2023   Macrovascular  complications:    Denies: CAD, PVD, CVA    HISTORY:  Past Medical History:  Past Medical History:  Diagnosis Date   Arthritis    Diabetes mellitus without complication (HCC)    type 2   Thyroid  disease    Past Surgical History:  Past Surgical History:  Procedure Laterality Date   APPENDECTOMY  1993   CALCANEAL OSTEOTOMY Left 07/17/2016   Procedure: CALCANEAL OSTEOTOMY;  Surgeon: Kayla Pinal, MD;  Location: [facility] ORS;  Service: Orthopedics;  Laterality: Left;   COLONOSCOPY  12/18/2018   found 1 3mm polyp, going back in 5 years    COLONOSCOPY WITH PROPOFOL  N/A 12/18/2018   Procedure: COLONOSCOPY WITH PROPOFOL ;  Surgeon: Therisa Bi, MD;  Location: [facility] ENDOSCOPY;  Service: Gastroenterology;  Laterality: N/A;   IRRIGATION AND DEBRIDEMENT ABSCESS N/A 01/24/2015   Procedure: IRRIGATION AND DEBRIDEMENT ABSCESS/GROIN ABSCESS;  Surgeon: Lonni Brands, MD;  Location: [facility] ORS;  Service: General;  Laterality: N/A;   KNEE ARTHROSCOPY W/ MENISCAL REPAIR Right 2008   ROTATOR CUFF REPAIR Right 2015   TONSILLECTOMY  1998   Social History:  reports that she has quit smoking. Her smoking use included cigarettes. She started smoking about 23 months ago. She has a 7.8 pack-year smoking history. She has never used smokeless tobacco. She reports that she does not currently use alcohol. She reports that she does not use drugs. Family History:  Family History  Problem Relation Age of Onset   Diabetes Father    Heart failure Father    Diabetes Brother    Arthritis Mother    Diabetes Mother    Transient ischemic attack Mother    Heart failure Maternal Grandmother    Alcohol abuse Maternal Grandmother    Diabetes Paternal Grandmother    Kidney failure Paternal Grandmother    Heart failure Paternal Grandfather    Cancer Paternal Grandfather        unknown type   Hypertension Brother    Breast cancer Maternal Great-grandmother     OBJECTIVE:   Vital Signs:BP 110/68   Ht 5' 8 (1.727  m)   Wt 271 lb (122.9 kg)   LMP 06/28/2015 (Approximate)   BMI 41.21 kg/m   Wt Readings from Last 3 Encounters:  04/12/24 271 lb (122.9 kg)  03/26/24 268 lb 8 oz (121.8 kg)  01/13/24 263 lb (119.3 kg)     Exam: General: Pt appears well and is in NAD  Lungs: Clear with good BS bilat   Heart: RRR   Neuro: MS is good with appropriate affect, pt is alert and Ox3   DM foot exam: 01/13/2024 per podiatry    DATA REVIEWED:  Lab Results  Component Value Date   HGBA1C 7.9 (A) 11/19/2023   HGBA1C 7.5 (A) 07/22/2023   HGBA1C 8.6 (H) 04/07/2023    Latest Reference Range & Units 04/12/24 09:40  Total CHOL/HDL Ratio <5.0 (calc) 1.9  Cholesterol <200 mg/dL 870  HDL Cholesterol > OR = 50 mg/dL 69  LDL Cholesterol (Calc) mg/dL (calc) 49  MICROALB/CREAT RATIO <30 mg/g creat 12  Non-HDL Cholesterol (Calc) <130 mg/dL (calc) 60  Triglycerides <150 mg/dL 38    Latest Reference Range & Units 04/12/24 09:40  TSH 0.40 - 4.50 mIU/L 0.49    Latest Reference Range & Units 04/12/24 09:40  Microalb, Ur mg/dL 2.5  MICROALB/CREAT RATIO <30 mg/g creat 12  Creatinine, Urine 20 - 275 mg/dL 789      Latest Reference Range & Units 11/13/23 03:48  Sodium 135 - 145 mmol/L 140  Potassium 3.5 - 5.1 mmol/L 4.5  Chloride 98 - 111 mmol/L 109  CO2 22 - 32 mmol/L 24  Glucose 70 - 99 mg/dL 87  BUN 6 - 20 mg/dL 29 (H)  Creatinine 9.55 - 1.00 mg/dL 9.13  Calcium  8.9 - 10.3 mg/dL 8.7 (L)  Anion gap 5 - 15  7  Phosphorus 2.5 - 4.6 mg/dL 2.8  Magnesium  1.7 - 2.4 mg/dL 2.3  GFR, Estimated >39 mL/min >60  (H): Data is abnormally high (L): Data is abnormally low ISLET CELL ANTIBODY SCREEN NEGATIVE POSITIVE   Glutamic Acid Decarb Ab <5 IU/mL >250 High     ASSESSMENT / PLAN / RECOMMENDATIONS:  1)Type 1 Diabetes Mellitus (LADA) , Sub-optimally  controlled, With Neuropathic and retinopathic complications - Most recent A1c of 7.8 %. Goal A1c < 7.0 %.   -A1c remains above goal -She is intolerant to  Ozempic  -SGLT2 inhibitors are to be avoided due to risk of DKA -Intolerant to metformin  - She has not been consistent with Mounjaro  intake, due to being busy with daughters hospitalizations, I did advise the patient to take Mounjaro  when she remembers during the week, to prevent glucose fluctuations -In reviewing CGM/pump download, the patient has been noted with postprandial hyperglycemia, on occasions there has been no boluses, she was also noted with post bolus hypoglycemia - Based on the above, I will decrease her basal rate, as well as sensitivity factor - Patient was advised to enter 14 g of carbohydrates with her meals - We opted not to increase the Mounjaro , so that way she would not develop any side effects while also caring for her daughter    MEDICATIONS: Can sinew Mounjaro  7.5 mg weekly NovoLog    Pump   Omnipod Settings   Insulin  type   Novolog    Basal rate       0000 2.1u/h               I:C ratio       0000 1:1    # 14 g               Sensitivity       0000  40      Goal       0000  120        EDUCATION / INSTRUCTIONS: BG monitoring instructions: Patient is instructed to check her blood sugars 4 times a day, before meals and bedtime. Call [facility] if: BG persistently < 70  I reviewed the Rule of 15 for the treatment of hypoglycemia in detail with the patient. Literature supplied.  2) Postablative hypothyroidism:  - Patient is clinically euthyroid -  No local neck symptoms - TSH is within normal range, no change  Medication  Continue levothyroxine  200 mcg daily   3) Dyslipidemia :    - Lipids today at goal - No change    Medication  Continue rosuvastatin  40 mg daily   F/U in 3 months      Signed electronically by: Stefano Redgie Butts, MD  [facility]  [facility] [address] [place] [place], [place] [address] Phone: [phone] FAX: [phone]   CC: Avelina Greig BRAVO,  MD [address] [place] [address] Phone: [phone]  Fax: [phone]  Return to Endocrinology clinic as below: Future Appointments  Date Time Provider Department Center  04/16/2024  8:20 AM Avelina Greig BRAVO, MD [facility] [address]  04/16/2024  4:00 PM Avelina Greig BRAVO, MD [facility] [address]  04/21/2024 12:30 PM Karoline Lima, RN [facility] None

## 2024-04-13 ENCOUNTER — Ambulatory Visit: Payer: Self-pay | Admitting: Internal Medicine

## 2024-04-13 LAB — LIPID PANEL
Cholesterol: 129 mg/dL (ref <200)
HDL: 69 mg/dL (ref >=50)
LDL Cholesterol (Calc): 49 mg/dL
Non-HDL Cholesterol (Calc): 60 mg/dL (ref <130)
Total CHOL/HDL Ratio: 1.9 (calc) (ref <5.0)
Triglycerides: 38 mg/dL (ref <150)

## 2024-04-13 LAB — MICROALBUMIN / CREATININE URINE RATIO
Creatinine, Urine: 210 mg/dL (ref 20–275)
Microalb Creat Ratio: 12 mg/g{creat} (ref <30)
Microalb, Ur: 2.5 mg/dL

## 2024-04-13 LAB — TSH: TSH: 0.49 m[IU]/L (ref 0.40–4.50)

## 2024-04-13 MED ORDER — INSULIN ASPART 100 UNIT/ML IJ SOLN: INTRAMUSCULAR | 3 refills | Status: DC

## 2024-04-13 MED ORDER — ROSUVASTATIN CALCIUM 40 MG PO TABS: 40.0000 mg | ORAL_TABLET | Freq: Every day | ORAL | 3 refills | Status: DC

## 2024-04-13 MED ORDER — LEVOTHYROXINE SODIUM 200 MCG PO TABS: 200.0000 ug | ORAL_TABLET | Freq: Every day | ORAL | 1 refills | Status: DC

## 2024-04-14 DIAGNOSIS — Z0279 Encounter for issue of other medical certificate: Secondary | ICD-10-CM

## 2024-04-14 NOTE — Telephone Encounter (Signed)
 Physician statement form received for provider to complete.  Forms will be faxed to [employer] at [phone] when complete.   Spoke with patient. She would like a phone call after forms have been faxed to [facility].  Forms placed in providers box for review.

## 2024-04-15 ENCOUNTER — Telehealth: Payer: Self-pay | Admitting: Family Medicine

## 2024-04-15 NOTE — Telephone Encounter (Signed)
 Lvm patient has 2 apt for 11/21 just needs one.  Advised to keep Physical appt with Dr Avelina since she is due.just need to know which time works best for her

## 2024-04-16 ENCOUNTER — Encounter: Payer: Self-pay | Admitting: Family Medicine

## 2024-04-16 ENCOUNTER — Telehealth: Payer: Self-pay | Admitting: Family Medicine

## 2024-04-16 ENCOUNTER — Encounter: Admitting: Family Medicine

## 2024-04-16 ENCOUNTER — Ambulatory Visit (INDEPENDENT_AMBULATORY_CARE_PROVIDER_SITE_OTHER): Admitting: Family Medicine

## 2024-04-16 VITALS — BP 140/82 | HR 106 | Temp 98.4°F | Ht 68.0 in | Wt 266.4 lb

## 2024-04-16 DIAGNOSIS — F331 Major depressive disorder, recurrent, moderate: Secondary | ICD-10-CM | POA: Insufficient documentation

## 2024-04-16 DIAGNOSIS — F411 Generalized anxiety disorder: Secondary | ICD-10-CM | POA: Diagnosis not present

## 2024-04-16 NOTE — Patient Instructions (Signed)
 Hold wellbutrin .  Continue counseling.  Continue FMLA 20 hours a week.  Use clonazepam   twice daily.

## 2024-04-16 NOTE — Progress Notes (Signed)
 Patient ID: Tanya Graves, female    DOB: 01-02-1966, 58 y.o.   MRN: [id number]  This visit was conducted in person.  BP (!) 140/82   Pulse (!) 106   Temp 98.4 F (36.9 C) (Temporal)   Ht 5' 8 (1.727 m)   Wt 266 lb 6 oz (120.8 kg)   LMP 06/28/2015 (Approximate)   SpO2 96%   BMI 40.50 kg/m    CC:  Chief Complaint  Patient presents with   Anxiety    Follow Up Seen Kenney Roys 03/26/2024     Form Completion    Disability PPW     Subjective:   HPI: Tanya Graves is a 58 y.o. female presenting on 04/16/2024 for Anxiety (Follow Up Seen Kenney Roys 03/26/2024//) and Form Completion (Disability PPW/)  Situational anxiety   She has been overwhelmed in last  few months with her daughters health issue  She is on FMLA for 20 hours a week limitations, but work is not respecting this limitation  Despite this job is continuing to overload her a work.  She is under more stress.  She is having trouble completing job duties given trouble prioritizing given poor concentration. Cannot concentrate  on each job task.  More tearful at the drop of the hat.  Early morning waking and only 5 hours of sleep a night.  Replaying  stressful incidents over and over.  Decreased pleasure in home and job activities. Unable to focus and understand new job tasks.  She is anxious and worried.   See counselor through work... once a week.   She feels wellbutrin  XL 150 mg daily... makes her feel less motivated.  HAs not helped.  CLonazepam  twice daily is helping her calm down some.    04/16/2024    8:27 AM 03/26/2024    8:28 AM 12/17/2023    2:05 PM  Depression screen PHQ 2/9  Decreased Interest 2 0 0  Down, Depressed, Hopeless 2 1 0  PHQ - 2 Score 4 1 0  Altered sleeping 1 2 1   Tired, decreased energy 2 2 1   Change in appetite 2 0 0  Feeling bad or failure about yourself  0 0 0  Trouble concentrating 1 0 0  Moving slowly or fidgety/restless 0 0 0  Suicidal thoughts 0 0 0  PHQ-9  Score 10 5  2    Difficult doing work/chores Very difficult Somewhat difficult      Data saved with a previous flowsheet row definition        04/16/2024    8:28 AM 03/26/2024    8:29 AM 12/17/2023    2:06 PM 08/27/2021    8:55 AM  GAD 7 : Generalized Anxiety Score  Nervous, Anxious, on Edge 2 1 0 2  Control/stop worrying 1 2 0 0  Worry too much - different things 1 1 0 1  Trouble relaxing 0 1 0 1  Restless 0 0 0 0  Easily annoyed or irritable 2 1 0 2  Afraid - awful might happen 0 0 0 0  Total GAD 7 Score 6 6 0 6  Anxiety Difficulty Very difficult Somewhat difficult Not difficult at all Not difficult at all         Relevant past medical, surgical, family and social history reviewed and updated as indicated. Interim medical history since our last visit reviewed. Allergies and medications reviewed and updated. Outpatient Medications Prior to Visit  Medication Sig Dispense Refill   meloxicam  (MOBIC )  15 MG tablet Take 15 mg by mouth daily as needed for pain.     albuterol  (VENTOLIN  HFA) 108 (90 Base) MCG/ACT inhaler INHALE 2 PUFFS BY MOUTH EVERY 6 HOURS AS NEEDED FOR WHEEZING OR SHORTNESS OF BREATH 8.5 g 2   BAQSIMI  ONE PACK 3 MG/DOSE POWD PLACE 1 DEVICE INTO THE NOSE AS DIRECTED 1 each 3   buPROPion  (WELLBUTRIN  XL) 150 MG 24 hr tablet Take 1 tablet (150 mg total) by mouth daily. 30 tablet 3   clonazePAM  (KLONOPIN ) 0.5 MG tablet Take 1 tablet (0.5 mg total) by mouth 2 (two) times daily as needed for anxiety. 60 tablet 0   Continuous Glucose Sensor (DEXCOM G7 SENSOR) MISC 1 Device by Does not apply route as directed. 9 each 3   CONTOUR NEXT TEST test strip USE TO TEST BLOOD SUGAR 6 TIMES A DAY 100 strip 3   insulin  aspart (NOVOLOG ) 100 UNIT/ML injection Max daily 100 units via OmniPod 100 mL 3   Insulin  Disposable Pump (OMNIPOD 5 G7 PODS, GEN 5,) MISC 1 Device by Does not apply route every other day. 135 each 3   levothyroxine  (SYNTHROID ) 200 MCG tablet Take 1 tablet (200 mcg total)  by mouth daily. 90 tablet 1   rosuvastatin  (CRESTOR ) 40 MG tablet Take 1 tablet (40 mg total) by mouth daily. 90 tablet 3   tirzepatide  (MOUNJARO ) 7.5 MG/0.5ML Pen Inject 7.5 mg into the skin once a week. 9 mL 3   tiZANidine (ZANAFLEX) 4 MG tablet Take 4 mg by mouth 3 (three) times daily as needed for muscle spasms.     meloxicam  (MOBIC ) 15 MG tablet TAKE 1 TABLET BY MOUTH DAILY 30 tablet 2   varenicline  (CHANTIX  CONTINUING MONTH PAK) 1 MG tablet Take 1 tablet (1 mg total) by mouth 2 (two) times daily. (Patient taking differently: Take 1 mg by mouth 2 (two) times daily. Takes once a day) 180 tablet 0   No facility-administered medications prior to visit.     Per HPI unless specifically indicated in ROS section below Review of Systems  Constitutional:  Negative for fatigue and fever.  HENT:  Negative for congestion.   Eyes:  Negative for pain.  Respiratory:  Negative for cough and shortness of breath.   Cardiovascular:  Negative for chest pain, palpitations and leg swelling.  Gastrointestinal:  Negative for abdominal pain.  Genitourinary:  Negative for dysuria and vaginal bleeding.  Musculoskeletal:  Negative for back pain.  Neurological:  Negative for syncope, light-headedness and headaches.  Psychiatric/Behavioral:  Negative for dysphoric mood.    Objective:  BP (!) 140/82   Pulse (!) 106   Temp 98.4 F (36.9 C) (Temporal)   Ht 5' 8 (1.727 m)   Wt 266 lb 6 oz (120.8 kg)   LMP 06/28/2015 (Approximate)   SpO2 96%   BMI 40.50 kg/m   Wt Readings from Last 3 Encounters:  04/16/24 266 lb 6 oz (120.8 kg)  04/12/24 271 lb (122.9 kg)  03/26/24 268 lb 8 oz (121.8 kg)      Physical Exam Constitutional:      General: She is not in acute distress.    Appearance: Normal appearance. She is well-developed. She is not ill-appearing or toxic-appearing.  HENT:     Head: Normocephalic.     Right Ear: Hearing, tympanic membrane, ear canal and external ear normal. Tympanic membrane is not  erythematous, retracted or bulging.     Left Ear: Hearing, tympanic membrane, ear canal and external ear normal.  Tympanic membrane is not erythematous, retracted or bulging.     Nose: No mucosal edema or rhinorrhea.     Right Sinus: No maxillary sinus tenderness or frontal sinus tenderness.     Left Sinus: No maxillary sinus tenderness or frontal sinus tenderness.     Mouth/Throat:     Pharynx: Uvula midline.  Eyes:     General: Lids are normal. Lids are everted, no foreign bodies appreciated.     Conjunctiva/sclera: Conjunctivae normal.     Pupils: Pupils are equal, round, and reactive to light.  Neck:     Thyroid : No thyroid  mass or thyromegaly.     Vascular: No carotid bruit.     Trachea: Trachea normal.  Cardiovascular:     Rate and Rhythm: Normal rate and regular rhythm.     Pulses: Normal pulses.     Heart sounds: Normal heart sounds, S1 normal and S2 normal. No murmur heard.    No friction rub. No gallop.  Pulmonary:     Effort: Pulmonary effort is normal. No tachypnea or respiratory distress.     Breath sounds: Normal breath sounds. No decreased breath sounds, wheezing, rhonchi or rales.  Abdominal:     General: Bowel sounds are normal.     Palpations: Abdomen is soft.     Tenderness: There is no abdominal tenderness.  Musculoskeletal:     Cervical back: Normal range of motion and neck supple.  Skin:    General: Skin is warm and dry.     Findings: No rash.  Neurological:     Mental Status: She is alert.  Psychiatric:        Mood and Affect: Mood is anxious and depressed. Affect is tearful.        Speech: Speech normal.        Behavior: Behavior is agitated. Behavior is cooperative.        Thought Content: Thought content normal. Thought content does not include homicidal or suicidal ideation. Thought content does not include homicidal or suicidal plan.        Judgment: Judgment normal.       Results for orders placed or performed in visit on 04/12/24  POCT  glycosylated hemoglobin (Hb A1C)   Collection Time: 04/12/24  9:14 AM  Result Value Ref Range   Hemoglobin A1C 7.8 (A) 4.0 - 5.6 %   HbA1c POC (<> result, manual entry)     HbA1c, POC (prediabetic range)     HbA1c, POC (controlled diabetic range)    Lipid panel   Collection Time: 04/12/24  9:40 AM  Result Value Ref Range   Cholesterol 129 <200 mg/dL   HDL 69 > OR = 50 mg/dL   Triglycerides 38 <849 mg/dL   LDL Cholesterol (Calc) 49 mg/dL (calc)   Total CHOL/HDL Ratio 1.9 <5.0 (calc)   Non-HDL Cholesterol (Calc) 60 <869 mg/dL (calc)  Microalbumin / creatinine urine ratio   Collection Time: 04/12/24  9:40 AM  Result Value Ref Range   Creatinine, Urine 210 20 - 275 mg/dL   Microalb, Ur 2.5 mg/dL   Microalb Creat Ratio 12 <30 mg/g creat  TSH   Collection Time: 04/12/24  9:40 AM  Result Value Ref Range   TSH 0.49 0.40 - 4.50 mIU/L    Assessment and Plan  GAD (generalized anxiety disorder)  MDD (major depressive disorder), recurrent episode, moderate (HCC) Assessment & Plan: Acute, worsening control despite initiation of Wellbutrin .  Associated possible side effects of Wellbutrin  including anhedonia that was  not present before. She is not being supported at work and she is continuing to be asked to do additional job tasks. She has poor sleep, tearfulness, depressed mood, poor concentration and attention as well as excessive worry. She is unable to complete her job tasks.  FMLA paperwork completed for 20-hour workweek.  [employer] paperwork completed as well for leave. She will stop Wellbutrin  given side effects and we will see how she is doing with clonazepam  twice daily as needed for anxiety.  She will continue weekly counseling. Follow-up in 2 weeks for reassessment and possible initiation of SSRI or SNRI.     Return in about 2 weeks (around 04/30/2024) for  annual physical with  NO labs prior.   Greig Ring, MD

## 2024-04-16 NOTE — Telephone Encounter (Signed)
Pt came by and picked up form

## 2024-04-16 NOTE — Assessment & Plan Note (Signed)
 Acute, worsening control despite initiation of Wellbutrin .  Associated possible side effects of Wellbutrin  including anhedonia that was not present before. She is not being supported at work and she is continuing to be asked to do additional job tasks. She has poor sleep, tearfulness, depressed mood, poor concentration and attention as well as excessive worry. She is unable to complete her job tasks.  FMLA paperwork completed for 20-hour workweek.  [employer] paperwork completed as well for leave. She will stop Wellbutrin  given side effects and we will see how she is doing with clonazepam  twice daily as needed for anxiety.  She will continue weekly counseling. Follow-up in 2 weeks for reassessment and possible initiation of SSRI or SNRI.

## 2024-04-16 NOTE — Telephone Encounter (Signed)
 Received completed forms from provider and faxed to [employer] at [phone]  Copy sent to scan  Copy left at the front desk for patient to pick up today as requested.

## 2024-04-21 ENCOUNTER — Other Ambulatory Visit: Payer: Self-pay

## 2024-04-21 NOTE — Patient Outreach (Signed)
 Complex Care Management   Visit Note  04/21/2024  Name:  Tanya Graves MRN: [id number] DOB: [birth year]  Situation: Referral received for Complex Care Management related to Diabetes. I obtained verbal consent from Patient.  Visit completed with  Background:   Past Medical History:  Diagnosis Date   Arthritis    Diabetes mellitus without complication (HCC)    type 2   Thyroid  disease      Assessment: Patient Reported Symptoms: Cognitive Cognitive Status: Alert and oriented to person, place, and time, Normal speech and language skills Cognitive/Intellectual Conditions Management [RPT]: None reported or documented in medical history or problem list Health Maintenance Behaviors: Annual physical exam, Stress management, Healthy diet Healing Pattern: Average Health Facilitated by: Healthy diet, Rest  Neurological Neurological Review of Symptoms: No symptoms reported Neurological Management Strategies: Routine screening Neurological Self-Management Outcome: 4 (good)  HEENT HEENT Symptoms Reported: No symptoms reported HEENT Management Strategies: Routine screening HEENT Self-Management Outcome: 4 (good)  Cardiovascular Cardiovascular Symptoms Reported: No symptoms reported Does patient have uncontrolled Hypertension?: No Cardiovascular Self-Management Outcome: 4 (good)  Respiratory Respiratory Symptoms Reported: No symptoms reported Respiratory Management Strategies: Coping strategies, Routine screening, Medication therapy Respiratory Self-Management Outcome: 4 (good)  Endocrine Endocrine Symptoms Reported: No symptoms reported Is patient diabetic?: Yes  Gastrointestinal Gastrointestinal Symptoms Reported: No symptoms reported Gastrointestinal Self-Management Outcome: 4 (good)  Genitourinary Genitourinary Symptoms Reported: No symptoms reported Genitourinary Self-Management Outcome: 4 (good)  Integumentary Integumentary Symptoms Reported: No symptoms reported Skin Management  Strategies: Routine screening Skin Self-Management Outcome: 4 (good)  Musculoskeletal Musculoskelatal Symptoms Reviewed: No symptoms reported Musculoskeletal Self-Management Outcome: 4 (good)  Psychosocial Psychosocial Symptoms Reported: Depression - if selected complete PHQ 2-9 Additional Psychological Details: Reports increased anxiety due to changes in her daughter's health and work related stress Behavioral Management Strategies: Support system, Medication therapy, Coping strategies, Adequate rest Behavioral Health Self-Management Outcome: 4 (good) Major Change/Loss/Stressor/Fears (CP): Medical condition, family Behaviors When Feeling Stressed/Fearful: Rest Techniques to Cope with Loss/Stress/Change: Medication, Diversional activities Quality of Family Relationships: supportive, involved Do you feel physically threatened by others?: No    04/23/2024    PHQ2-9 Depression Screening   Little interest or pleasure in doing things More than half the days  Feeling down, depressed, or hopeless More than half the days  PHQ-2 - Total Score 4  Trouble falling or staying asleep, or sleeping too much Several days  Feeling tired or having little energy Several days  Poor appetite or overeating  Several days  Feeling bad about yourself - or that you are a failure or have let yourself or your family down Not at all  Trouble concentrating on things, such as reading the newspaper or watching television Several days  Moving or speaking so slowly that other people could have noticed.  Or the opposite - being so fidgety or restless that you have been moving around a lot more than usual Not at all  Thoughts that you would be better off dead, or hurting yourself in some way Not at all  PHQ2-9 Total Score 8  If you checked off any problems, how difficult have these problems made it for you to do your work, take care of things at home, or get along with other people Very difficult  Depression  Interventions/Treatment Medication (Reports recent changes in overall mood due to changes in her daughter's health and work related stress due to increased time away from her work place)    There were no vitals filed for this visit. Pain Scale:  0-10 Pain Score: 0-No pain  Medications Reviewed Today     Reviewed by Karoline Lima, RN (Registered Nurse) on 04/21/24 at 1241  Med List Status: <None>   Medication Order Taking? Sig Documenting Provider Last Dose Status Informant  albuterol  (VENTOLIN  HFA) 108 (90 Base) MCG/ACT inhaler [id number]  INHALE 2 PUFFS BY MOUTH EVERY 6 HOURS AS NEEDED FOR WHEEZING OR SHORTNESS OF BREATH Bedsole, Amy E, MD  Active   BAQSIMI  ONE PACK 3 MG/DOSE POWD [id number]  PLACE 1 DEVICE INTO THE NOSE AS DIRECTED Shamleffer, Ibtehal Jaralla, MD  Active   buPROPion  (WELLBUTRIN  XL) 150 MG 24 hr tablet [id number]  Take 1 tablet (150 mg total) by mouth daily. Webb, Padonda B, FNP  Active   clonazePAM  (KLONOPIN ) 0.5 MG tablet [id number]  Take 1 tablet (0.5 mg total) by mouth 2 (two) times daily as needed for anxiety. Webb, Padonda B, FNP  Active   Continuous Glucose Sensor (DEXCOM G7 SENSOR) MISC [id number]  1 Device by Does not apply route as directed. Shamleffer, Donell Cardinal, MD  Active   CONTOUR NEXT TEST test strip [id number]  USE TO TEST BLOOD SUGAR 6 TIMES A DAY Shamleffer, Ibtehal Jaralla, MD  Active   insulin  aspart (NOVOLOG ) 100 UNIT/ML injection [id number]  Max daily 100 units via OmniPod Shamleffer, Ibtehal Jaralla, MD  Active   Insulin  Disposable Pump (OMNIPOD 5 G7 PODS, GEN 5,) MISC [id number]  1 Device by Does not apply route every other day. Shamleffer, Ibtehal Jaralla, MD  Active   levothyroxine  (SYNTHROID ) 200 MCG tablet [id number]  Take 1 tablet (200 mcg total) by mouth daily. Shamleffer, Ibtehal Jaralla, MD  Active   meloxicam  (MOBIC ) 15 MG tablet [id number]  Take 15 mg by mouth daily as needed for pain. [provider]  Active   rosuvastatin  (CRESTOR ) 40  MG tablet [id number]  Take 1 tablet (40 mg total) by mouth daily. Shamleffer, Ibtehal Jaralla, MD  Active   tirzepatide  (MOUNJARO ) 7.5 MG/0.5ML Pen [id number]  Inject 7.5 mg into the skin once a week. Shamleffer, Ibtehal Jaralla, MD  Active   tiZANidine (ZANAFLEX) 4 MG tablet [id number]  Take 4 mg by mouth 3 (three) times daily as needed for muscle spasms. [provider]  Active Self, Pharmacy Records            Recommendation:   Continue Current Plan of Care  Follow Up Plan:   Patient will continue completing outreach with the Embedded Pharmacist to address concerns related to continuous glucometer supplies, medications and options to optimize glycemic control.   Lima Karoline [facility] Population Health RN Care Manager Direct Dial : [phone]  Fax: [phone] Website: [email or web address]

## 2024-04-23 NOTE — Patient Instructions (Addendum)
 Thank you for allowing the Complex Care Management team to participate in your care. It was great speaking with you!   Reminders: -Please be sure to attend your PCP appointment as scheduled on April 30, 2024.   You are working very hard to improve your health and achieve optimal glycemic control. Stay encouraged! Please be sure to keep the Embedded Pharmacist updated with concerns regarding your continuing monitoring device and concerns with medication costs.   Jackson Acron [facility] Population Health RN Care Manager Direct Dial : [phone]  Fax: [phone] Website: [email or web address]

## 2024-04-30 ENCOUNTER — Ambulatory Visit: Admitting: Family Medicine

## 2024-04-30 ENCOUNTER — Encounter: Payer: Self-pay | Admitting: Family Medicine

## 2024-04-30 ENCOUNTER — Ambulatory Visit: Payer: Self-pay | Admitting: Family Medicine

## 2024-04-30 VITALS — BP 110/66 | HR 116 | Temp 99.0°F | Ht 68.0 in | Wt 257.0 lb

## 2024-04-30 DIAGNOSIS — I5032 Chronic diastolic (congestive) heart failure: Secondary | ICD-10-CM

## 2024-04-30 DIAGNOSIS — E039 Hypothyroidism, unspecified: Secondary | ICD-10-CM

## 2024-04-30 DIAGNOSIS — E559 Vitamin D deficiency, unspecified: Secondary | ICD-10-CM

## 2024-04-30 DIAGNOSIS — E108 Type 1 diabetes mellitus with unspecified complications: Secondary | ICD-10-CM

## 2024-04-30 DIAGNOSIS — Z0000 Encounter for general adult medical examination without abnormal findings: Secondary | ICD-10-CM

## 2024-04-30 DIAGNOSIS — D509 Iron deficiency anemia, unspecified: Secondary | ICD-10-CM

## 2024-04-30 DIAGNOSIS — F331 Major depressive disorder, recurrent, moderate: Secondary | ICD-10-CM

## 2024-04-30 DIAGNOSIS — R051 Acute cough: Secondary | ICD-10-CM

## 2024-04-30 DIAGNOSIS — E7800 Pure hypercholesterolemia, unspecified: Secondary | ICD-10-CM

## 2024-04-30 DIAGNOSIS — Z20828 Contact with and (suspected) exposure to other viral communicable diseases: Secondary | ICD-10-CM

## 2024-04-30 DIAGNOSIS — R509 Fever, unspecified: Secondary | ICD-10-CM | POA: Insufficient documentation

## 2024-04-30 DIAGNOSIS — F411 Generalized anxiety disorder: Secondary | ICD-10-CM

## 2024-04-30 LAB — CBC WITH DIFFERENTIAL/PLATELET
Basophils Absolute: 0 K/uL (ref 0.0–0.1)
Basophils Relative: 0.4 % (ref 0.0–3.0)
Eosinophils Absolute: 0.3 K/uL (ref 0.0–0.7)
Eosinophils Relative: 4.4 % (ref 0.0–5.0)
HCT: 41 % (ref 36.0–46.0)
Hemoglobin: 14.1 g/dL (ref 12.0–15.0)
Lymphocytes Relative: 30.1 % (ref 12.0–46.0)
Lymphs Abs: 2 K/uL (ref 0.7–4.0)
MCHC: 34.4 g/dL (ref 30.0–36.0)
MCV: 91.7 fl (ref 78.0–100.0)
Monocytes Absolute: 0.6 K/uL (ref 0.1–1.0)
Monocytes Relative: 8.5 % (ref 3.0–12.0)
Neutro Abs: 3.8 K/uL (ref 1.4–7.7)
Neutrophils Relative %: 56.6 % (ref 43.0–77.0)
Platelets: 188 K/uL (ref 150.0–400.0)
RBC: 4.46 Mil/uL (ref 3.87–5.11)
RDW: 12.6 % (ref 11.5–15.5)
WBC: 6.7 K/uL (ref 4.0–10.5)

## 2024-04-30 LAB — POC COVID19 BINAXNOW: SARS Coronavirus 2 Ag: NEGATIVE

## 2024-04-30 LAB — IBC + FERRITIN
Ferritin: 59.1 ng/mL (ref 10.0–291.0)
Iron: 62 ug/dL (ref 42–145)
Saturation Ratios: 17.6 % — ABNORMAL LOW (ref 20.0–50.0)
TIBC: 351.4 ug/dL (ref 250.0–450.0)
Transferrin: 251 mg/dL (ref 212.0–360.0)

## 2024-04-30 LAB — VITAMIN D 25 HYDROXY (VIT D DEFICIENCY, FRACTURES): VITD: 17.66 ng/mL — ABNORMAL LOW (ref 30.00–100.00)

## 2024-04-30 LAB — POC INFLUENZA A&B (BINAX/QUICKVUE)
Influenza A, POC: NEGATIVE
Influenza B, POC: NEGATIVE

## 2024-04-30 MED ORDER — ALBUTEROL SULFATE (2.5 MG/3ML) 0.083% IN NEBU
2.5000 mg | INHALATION_SOLUTION | Freq: Once | RESPIRATORY_TRACT | Status: AC
  Administered 2024-04-30: 2.5 mg via RESPIRATORY_TRACT

## 2024-04-30 MED ORDER — QVAR REDIHALER 40 MCG/ACT IN AERB: 2.0000 | INHALATION_SPRAY | Freq: Two times a day (BID) | RESPIRATORY_TRACT | 0 refills | Status: DC

## 2024-04-30 NOTE — Assessment & Plan Note (Signed)
 Very mild likely associated with life threatening illness in 2023. SABRA  No fluid overload. Not requiring diuretic.

## 2024-04-30 NOTE — Assessment & Plan Note (Addendum)
 Chronic, moderate control on insulin  pump followed by endocrinology.   She is following closely given current illness with emesis.. CBGs stable.. she is going to start sugar free pedialyte etc, push fluids.  Mounjaro  7.5 mg weekly.

## 2024-04-30 NOTE — Assessment & Plan Note (Signed)
 Due for reevaluation

## 2024-04-30 NOTE — Assessment & Plan Note (Signed)
 BMI 39 with more than 1 comorbidity including diabetes, sleep apnea, high cholesterol, diastolic heart failure

## 2024-04-30 NOTE — Patient Instructions (Addendum)
 Call to schedule colonoscopy with Dr. Therisa Finn [facility]  [phone]   Please call the location of your choice from the menu below to schedule your Mammogram and/or Bone Density appointment.    [facility]                      Phone:  [phone] [address]                               [place], [place] [address]                                                             Services: Traditional and 3D Mammogram, Bone Density   [facility]                 Phone: [phone] [address]                                                       [place], [place] [address]    Service: Bone Density ONLY   *this site does NOT perform mammograms  [facility]                        Phone:  [phone] [address]                                  [place], [place] [address]                                            Services:  3D Mammogram and Bone Density    [place] [facility]   Phone:  [phone]   [address]                                                                            [place], [place] [address]                                            Services: 3D Mammogram and Bone [facility])  Phone:  [phone]   [address]                        [name], [place] [address]  Services:  3D Mammogram and Bone Density

## 2024-04-30 NOTE — Assessment & Plan Note (Signed)
 New finding in summer 2025, she was anemic in 2023 but she also had significant other health problems. This new iron  deficiency anemia may be due to hematemesis that she had prior to admission. Hemoccult cards x 3 to rule out microscopic blood loss from GI source... Negative for GI blood loss Of note she will still need a screening colonoscopy this year.  She is currently taking daily iron  and we will recheck her iron  panel and hemoglobin NOW.

## 2024-04-30 NOTE — Progress Notes (Signed)
 Patient ID: Tanya Graves, female    DOB: [birth year], 58 y.o.   MRN: [id number]  This visit was conducted in person.  BP 110/66   Pulse (!) 116   Temp 99 F (37.2 C) (Oral)   Ht 5' 8 (1.727 m)   Wt 257 lb (116.6 kg)   LMP 06/28/2015 (Approximate)   SpO2 96%   BMI 39.08 kg/m    CC:  Chief Complaint  Patient presents with   Annual Exam   Cough   Vomiting   Fatigue        Nasal Congestion   Facial Pain         Subjective:   HPI: Tanya Graves is a 58 y.o. female presenting on 04/30/2024 for Annual Exam, Cough, Vomiting, Fatigue (/), Nasal Congestion, and Facial Pain (/)  The patient presents for complete physical and review of chronic health problems. She also has the following acute concerns today:  3 days ago started with fatigue, nasal congestion, sinus pressure, coughing, emesis last night and today. Today has kept down water .. trying to increase. CBGs 115-180 on CGM.  Sister with sinus infection, 2 babies in family with RSV.   She is having some mild SOB, chest tight   Has not required albuterol  inhaler yet.  Low grade temp.  Recent issues with major depressive disorder and generalized anxiety.  At last OV:  FMLA paperwork completed for 20-hour workweek.  [place] paperwork completed as well for leave. She will stop Wellbutrin  given side effects and we will see how she is doing with clonazepam  twice daily as needed for anxiety.  She will continue weekly counseling. Follow-up in 2 weeks for reassessment and possible initiation of SSRI or SNRI.  Today she reports: She did not stop the wellbutrin , clonazepam  twice daily prn.  She reports feeling some more motivation.  She continues to be frustrated.  Falling asleep okay but early morning. She would like to continue wellbutrin  at the current dose.   Type 1 diabetes with history of DKA, hypothyroidism: Followed by Dr. Keary endocrinology. Reviewed last endocrinology office visit April 12 2024 Patient treated with OmniPod insulin  pump. Mounjaro  7.5 mg weekly Lab Results  Component Value Date   HGBA1C 7.8 (A) 04/12/2024  Up-to-date with diabetes kidney evaluation, foot exam, microalbuminuria check  Hypothyroid, post ablative: Chronic, stable control on levothyroxine  175 mcg daily Lab Results  Component Value Date   TSH 0.49 04/12/2024   Obstructive sleep apnea:  no longer using CPAP given nasal congestion.  High cholesterol : LDL at goal on rosuvastatin  40 mg p.o. daily Lab Results  Component Value Date   CHOL 129 04/12/2024   HDL 69 04/12/2024   LDLCALC 49 04/12/2024   LDLDIRECT 88.0 03/25/2019   TRIG 38 04/12/2024   CHOLHDL 1.9 04/12/2024   Continued weight loss.  BMI 39 Wt Readings from Last 3 Encounters:  04/30/24 257 lb (116.6 kg)  04/16/24 266 lb 6 oz (120.8 kg)  04/12/24 271 lb (122.9 kg)      Relevant past medical, surgical, family and social history reviewed and updated as indicated. Interim medical history since our last visit reviewed. Allergies and medications reviewed and updated. Outpatient Medications Prior to Visit  Medication Sig Dispense Refill   albuterol  (VENTOLIN  HFA) 108 (90 Base) MCG/ACT inhaler INHALE 2 PUFFS BY MOUTH EVERY 6 HOURS AS NEEDED FOR WHEEZING OR SHORTNESS OF BREATH 8.5 g 2   BAQSIMI  ONE PACK 3 MG/DOSE POWD PLACE 1 DEVICE INTO THE  NOSE AS DIRECTED 1 each 3   buPROPion  (WELLBUTRIN  XL) 150 MG 24 hr tablet Take 1 tablet (150 mg total) by mouth daily. 30 tablet 3   clonazePAM  (KLONOPIN ) 0.5 MG tablet Take 1 tablet (0.5 mg total) by mouth 2 (two) times daily as needed for anxiety. 60 tablet 0   Continuous Glucose Sensor (DEXCOM G7 SENSOR) MISC 1 Device by Does not apply route as directed. 9 each 3   CONTOUR NEXT TEST test strip USE TO TEST BLOOD SUGAR 6 TIMES A DAY 100 strip 3   insulin  aspart (NOVOLOG ) 100 UNIT/ML injection Max daily 100 units via OmniPod 100 mL 3   Insulin  Disposable Pump (OMNIPOD 5 G7 PODS, GEN 5,) MISC 1  Device by Does not apply route every other day. 135 each 3   levothyroxine  (SYNTHROID ) 200 MCG tablet Take 1 tablet (200 mcg total) by mouth daily. 90 tablet 1   meloxicam  (MOBIC ) 15 MG tablet Take 15 mg by mouth daily as needed for pain.     rosuvastatin  (CRESTOR ) 40 MG tablet Take 1 tablet (40 mg total) by mouth daily. 90 tablet 3   tirzepatide  (MOUNJARO ) 7.5 MG/0.5ML Pen Inject 7.5 mg into the skin once a week. 9 mL 3   tiZANidine (ZANAFLEX) 4 MG tablet Take 4 mg by mouth 3 (three) times daily as needed for muscle spasms.     No facility-administered medications prior to visit.     Per HPI unless specifically indicated in ROS section below Review of Systems  Constitutional:  Negative for fatigue and fever.  HENT:  Negative for congestion.   Eyes:  Negative for pain.  Respiratory:  Negative for cough and shortness of breath.   Cardiovascular:  Negative for chest pain, palpitations and leg swelling.  Gastrointestinal:  Negative for abdominal pain.  Genitourinary:  Negative for dysuria and vaginal bleeding.  Musculoskeletal:  Negative for back pain.  Neurological:  Negative for syncope, light-headedness and headaches.  Psychiatric/Behavioral:  Negative for dysphoric mood.     Objective:  BP 110/66   Pulse (!) 116   Temp 99 F (37.2 C) (Oral)   Ht 5' 8 (1.727 m)   Wt 257 lb (116.6 kg)   LMP 06/28/2015 (Approximate)   SpO2 96%   BMI 39.08 kg/m   Wt Readings from Last 3 Encounters:  04/30/24 257 lb (116.6 kg)  04/16/24 266 lb 6 oz (120.8 kg)  04/12/24 271 lb (122.9 kg)      Physical Exam Vitals and nursing note reviewed.  Constitutional:      General: She is not in acute distress.    Appearance: Normal appearance. She is well-developed. She is not ill-appearing or toxic-appearing.  HENT:     Head: Normocephalic.     Right Ear: Hearing, tympanic membrane, ear canal and external ear normal.     Left Ear: Hearing, tympanic membrane, ear canal and external ear normal.      Nose: Nose normal.  Eyes:     General: Lids are normal. Lids are everted, no foreign bodies appreciated.     Conjunctiva/sclera: Conjunctivae normal.     Pupils: Pupils are equal, round, and reactive to light.  Neck:     Thyroid : No thyroid  mass or thyromegaly.     Vascular: No carotid bruit.     Trachea: Trachea normal.  Cardiovascular:     Rate and Rhythm: Normal rate and regular rhythm.     Heart sounds: Normal heart sounds, S1 normal and S2 normal. No murmur  heard.    No gallop.  Pulmonary:     Effort: Pulmonary effort is normal. No respiratory distress.     Breath sounds: Wheezing present. No rhonchi or rales.     Comments: Diffuse wheeze and constant coughing Abdominal:     General: Bowel sounds are normal. There is no distension or abdominal bruit.     Palpations: Abdomen is soft. There is no fluid wave or mass.     Tenderness: There is no abdominal tenderness. There is no guarding or rebound.     Hernia: No hernia is present.  Musculoskeletal:     Cervical back: Normal range of motion and neck supple.  Lymphadenopathy:     Cervical: No cervical adenopathy.  Skin:    General: Skin is warm and dry.     Findings: No rash.  Neurological:     Mental Status: She is alert.     Cranial Nerves: No cranial nerve deficit.     Sensory: No sensory deficit.  Psychiatric:        Mood and Affect: Mood is not anxious or depressed.        Speech: Speech normal.        Behavior: Behavior normal. Behavior is cooperative.        Judgment: Judgment normal.       Results for orders placed or performed in visit on 04/30/24  POC COVID-19   Collection Time: 04/30/24 11:16 AM  Result Value Ref Range   SARS Coronavirus 2 Ag Negative Negative  POC Influenza A&B (Binax test)   Collection Time: 04/30/24 11:16 AM  Result Value Ref Range   Influenza A, POC Negative Negative   Influenza B, POC Negative Negative    Assessment and Plan The patient's preventative maintenance and recommended  screening tests for an annual wellness exam were reviewed in full today. Brought up to date unless services declined.  Counselled on the importance of diet, exercise, and its role in overall health and mortality. The patient's FH and SH was reviewed, including their home life, tobacco status, and drug and alcohol status.   Vaccines:  up-to-date with Shingrix and tetanus, PNA and flu Pap/DVE: ASCUS, positive high risk HPV Oct 03, 2021.SABRA  Evaluated per gynecology with colposcopy 2023, plan repeat yearly, nml pap in 2024, 2025 Mammo: February 05, 2022, repeat yearly.. DUE Bone Density: not indicated Colon: December 18, 2018, repeat in 5 years, due Smoking Status: Current every day smoker ETOH/ drug use:  Hep C: Done  HIV screen: Done Recommended yearly eye exam   Routine general medical examination at a health care facility  Type 1 diabetes mellitus with complications [facility]) Assessment & Plan: Chronic, moderate control on insulin  pump followed by endocrinology.   She is following closely given current illness with emesis.. CBGs stable.. she is going to start sugar free pedialyte etc, push fluids.  Mounjaro  7.5 mg weekly.   Hypothyroidism, unspecified type Assessment & Plan: Stable, chronic.  Continue current medication.   Levo 175 mcg daily   Hypercholesteremia Assessment & Plan: Stable, chronic.  Continue current medication. LDL at goal less than 70 Crestor  40 mg p.o. daily   Chronic diastolic CHF (congestive heart failure) (HCC) Assessment & Plan:  Very mild likely associated with life threatening illness in 2023. SABRA  No fluid overload. Not requiring diuretic.    GAD (generalized anxiety disorder)  MDD (major depressive disorder), recurrent episode, moderate (HCC) Assessment & Plan:  Chronic, improved control with Wellbutrin  150 mg daily... Continue current  dose.  Use clonazepam  BID prn.   Continued counseling.    Vitamin D  deficiency Assessment & Plan: Due  for reevaluation  Orders: -     VITAMIN D  25 Hydroxy (Vit-D Deficiency, Fractures)  Iron  deficiency anemia, unspecified iron  deficiency anemia type Assessment & Plan: New finding in summer 2025, she was anemic in 2023 but she also had significant other health problems. This new iron  deficiency anemia may be due to hematemesis that she had prior to admission. Hemoccult cards x 3 to rule out microscopic blood loss from GI source... Negative for GI blood loss Of note she will still need a screening colonoscopy this year.  She is currently taking daily iron  and we will recheck her iron  panel and hemoglobin NOW.  Orders: -     CBC with Differential/Platelet -     IBC + Ferritin  Morbid obesity (HCC) Assessment & Plan: BMI 39 with more than 1 comorbidity including diabetes, sleep apnea, high cholesterol, diastolic heart failure   Fever, unspecified fever cause Assessment & Plan:  Acute, rapid flu and COVID test negative.  Will send RSV as recent exposure and shortness of breath and wheezing. Given albuterol  2.5 mg nebulizer treatment in office today with moderate response. Recommend albuterol  2 puffs every 4-6 hours as needed for wheeze.  Rest and push fluids to avoid dehydration. Prednisone course would be indicated but patient has type 1 diabetes and predisposition to diabetic ketoacidosis.  Current blood sugar is normal but I would like to avoid prednisone if we can to avoid hyperglycemia. Will treat with inhaled corticosteroid.  Return and ER precautions provided.  Orders: -     Respiratory virus panel -     POC COVID-19 BinaxNow -     POC Influenza A&B(BINAX/QUICKVUE)  RSV exposure -     Respiratory virus panel -     Albuterol  Sulfate  Acute cough -     Albuterol  Sulfate  Other orders -     Qvar  RediHaler; Inhale 2 puffs into the lungs 2 (two) times daily.  Dispense: 1 each; Refill: 0      No follow-ups on file.   Greig Ring, MD

## 2024-04-30 NOTE — Assessment & Plan Note (Signed)
Stable, chronic.  Continue current medication.   Levo 175 mcg daily

## 2024-04-30 NOTE — Assessment & Plan Note (Addendum)
 Acute, rapid flu and COVID test negative.  Will send RSV as recent exposure and shortness of breath and wheezing. Given albuterol  2.5 mg nebulizer treatment in office today with moderate response. Recommend albuterol  2 puffs every 4-6 hours as needed for wheeze.  Rest and push fluids to avoid dehydration. Prednisone course would be indicated but patient has type 1 diabetes and predisposition to diabetic ketoacidosis.  Current blood sugar is normal but I would like to avoid prednisone if we can to avoid hyperglycemia. Will treat with inhaled corticosteroid.  Return and ER precautions provided.

## 2024-04-30 NOTE — Assessment & Plan Note (Signed)
 Stable, chronic.  Continue current medication. LDL at goal less than 70 Crestor  40 mg p.o. daily

## 2024-04-30 NOTE — Assessment & Plan Note (Signed)
 Chronic, improved control with Wellbutrin  150 mg daily... Continue current dose.  Use clonazepam  BID prn.   Continued counseling.

## 2024-05-03 LAB — RESPIRATORY VIRUS PANEL
Adenovirus B: NOT DETECTED
HUMAN PARAINFLU VIRUS 1: NOT DETECTED
HUMAN PARAINFLU VIRUS 2: NOT DETECTED
HUMAN PARAINFLU VIRUS 3: NOT DETECTED
INFLUENZA A SUBTYPE H1: NOT DETECTED
INFLUENZA A SUBTYPE H3: NOT DETECTED
Influenza A: NOT DETECTED
Influenza B: NOT DETECTED
Metapneumovirus: DETECTED — AB
Respiratory Syncytial Virus A: NOT DETECTED
Respiratory Syncytial Virus B: NOT DETECTED
Rhinovirus: DETECTED — AB

## 2024-05-03 NOTE — Telephone Encounter (Signed)
 Forms re-faxed per patient request. [employer] stating they did not receive.  Reached out to patient letting her know forms will be re-faxed.

## 2024-05-04 MED ORDER — VITAMIN D3 1.25 MG (50000 UT) PO CAPS: 1.0000 | ORAL_CAPSULE | ORAL | 1 refills | Status: DC

## 2024-05-04 NOTE — Addendum Note (Signed)
 Addended by: AVELINA NO E on: 05/04/2024 05:15 PM   Modules accepted: Orders

## 2024-05-06 ENCOUNTER — Encounter: Payer: Self-pay | Admitting: Family Medicine

## 2024-05-06 MED ORDER — CLONAZEPAM 0.5 MG PO TABS: 0.5000 mg | ORAL_TABLET | Freq: Two times a day (BID) | ORAL | 0 refills | Status: DC | PRN

## 2024-05-06 NOTE — Telephone Encounter (Signed)
 Last office visit 04/30/24 for CPE.  Last refilled 03/26/2024 for #60 with no refills by Padonda Webb.  Next Appt: 05/12/24 for accomodation form.

## 2024-05-07 ENCOUNTER — Other Ambulatory Visit: Payer: Self-pay | Admitting: Internal Medicine

## 2024-05-07 ENCOUNTER — Other Ambulatory Visit: Payer: Self-pay | Admitting: Family Medicine

## 2024-05-07 NOTE — Telephone Encounter (Signed)
 Chantix  not on current medication.  Please advise.

## 2024-05-10 NOTE — Telephone Encounter (Signed)
 Attending physician's statement received   Patient states she needs forms completed by her appointment on 12.17.25  Forms placed in providers box for review.

## 2024-05-11 DIAGNOSIS — Z0279 Encounter for issue of other medical certificate: Secondary | ICD-10-CM

## 2024-05-11 NOTE — Telephone Encounter (Signed)
 Complete as much as able.. need to discuss remaining parts with patient  to determine her ability to return to work full time or not.

## 2024-05-12 ENCOUNTER — Ambulatory Visit: Admitting: Family Medicine

## 2024-05-12 ENCOUNTER — Encounter: Payer: Self-pay | Admitting: Family Medicine

## 2024-05-12 VITALS — BP 99/61 | HR 94 | Temp 98.0°F | Ht 68.0 in | Wt 256.2 lb

## 2024-05-12 DIAGNOSIS — I959 Hypotension, unspecified: Secondary | ICD-10-CM | POA: Diagnosis not present

## 2024-05-12 DIAGNOSIS — E108 Type 1 diabetes mellitus with unspecified complications: Secondary | ICD-10-CM | POA: Diagnosis not present

## 2024-05-12 DIAGNOSIS — F331 Major depressive disorder, recurrent, moderate: Secondary | ICD-10-CM | POA: Diagnosis not present

## 2024-05-12 DIAGNOSIS — F411 Generalized anxiety disorder: Secondary | ICD-10-CM

## 2024-05-12 DIAGNOSIS — J0140 Acute pansinusitis, unspecified: Secondary | ICD-10-CM | POA: Diagnosis not present

## 2024-05-12 LAB — GLUCOSE, POCT (MANUAL RESULT ENTRY): POC Glucose: 515 mg/dL — AB (ref 70–99)

## 2024-05-12 MED ORDER — AMOXICILLIN 500 MG PO CAPS: 1000.0000 mg | ORAL_CAPSULE | Freq: Two times a day (BID) | ORAL | 0 refills | Status: DC

## 2024-05-12 NOTE — Assessment & Plan Note (Signed)
 Acute, ongoing viral infection now with possible bacterial superinfection given pressure over sinuses and increased blood sugar. She states she is feeling better overall with the viral symptoms. If blood sugar not coming down with stopping the inhaled corticosteroid she will consider treatment team possible bacterial sinus infection with amoxicillin  500 mg 2 tablets twice daily x 10 days.  If any fever or nausea and vomiting given hypotension, have low threshold to go to ED.

## 2024-05-12 NOTE — Assessment & Plan Note (Addendum)
 Acute worsening of control in the last few days.  Possibly secondary to inhaled corticosteroid she started in the last 3 days.  She did feel as though she had a pump failure this morning but has changed out her pump.  She is able to bolus her insulin  through her pump which she did in the office.  She will follow this closely.  I did send a message to endocrinology for their recommendations. She will contact them if blood sugar remains elevated.

## 2024-05-12 NOTE — Assessment & Plan Note (Signed)
 Acute, low blood pressure noted in office today.  Patient stated she first felt dizzy in our waiting room. She states that she has been staying well-hydrated and has no loss of fluids.  She states her urine is clear.  Her blood sugar has been elevated some in the last few days.  No clear signs of sepsis.  No fever.  Possible dehydration given several days of increased blood sugar.  Encouraged patient to drink liquids in office and blood pressure improved.  Considered ED visit for potential IV fluids but patient states she would like to try to push liquids at home given no nausea and vomiting. Reviewed return precautions in detail. Patient took an [address] home instead of driving herself.

## 2024-05-12 NOTE — Telephone Encounter (Signed)
 Forms received and faxed to [employer] at [phone]  Copy sent to scan  Copy for patient has been left at the front desk to pick up for their records  MyChart message left for patient letting her know forms are ready

## 2024-05-12 NOTE — Progress Notes (Signed)
 Patient ID: Tanya Graves, female    DOB: Aug 12, 1965, 58 y.o.   MRN: [id number]  This visit was conducted in person.  BP 99/61   Pulse 94   Temp 98 F (36.7 C) (Oral)   Ht 5' 8 (1.727 m)   Wt 256 lb 4 oz (116.2 kg)   LMP 06/28/2015   SpO2 98%   BMI 38.96 kg/m    CC:  Chief Complaint  Patient presents with   Medical Management of Chronic Issues    Pt here for Disability f/u    Subjective:   HPI: Tanya Graves is a 58 y.o. female presenting on 05/12/2024 for Medical Management of Chronic Issues (Pt here for Disability f/u)  GAD/MDD: needs update of accomodation form.  Patient reports she has been persistent about work adhering to 20 hours which has helped her anxiety and overwhelm.  She is continuing counseling. She feels that Wellbutrin  has not been significantly helpful and would like to wean off.  She states clonazepam  once a day as needed helps significantly.  She understands the tolerance building potential of this medication. Her daughter has an upcoming procedure within the next 2 weeks.  She would like to continue 20 hour/week work accommodation until May 27, 2024.  At that time she will increase to 30 hours/week until the next appointment in the beginning of February.    Feeling lightheaded today and BP low .SABRA Glucose 515 in office now. Blood sugar running higher lately... at last OV did have cough.. respiratory panel negative except meta and rhinovirus.  Was improving until noting pressure in face/sinuses in last 24 hours. Given water  in office. BP no change. But dizziness improved.  Pt denied CP, SOB, no diarrhea, no vomiting, no abdominal pain.  No wheeze.  Clear mucus.. copious mucus.SABRA daughter with sinus infecitons  No fever..  Not on BP meds.   Initially BP 78/58... had her push fluids. Pt bolus'ed herself on her insulin  pump per calculation 4.4 units. BP Readings from Last 3 Encounters:  05/12/24 99/61  04/30/24 110/66  04/16/24 (!) 140/82             Relevant past medical, surgical, family and social history reviewed and updated as indicated. Interim medical history since our last visit reviewed. Allergies and medications reviewed and updated. Outpatient Medications Prior to Visit  Medication Sig Dispense Refill   albuterol  (VENTOLIN  HFA) 108 (90 Base) MCG/ACT inhaler INHALE 2 PUFFS BY MOUTH EVERY 6 HOURS AS NEEDED FOR WHEEZING OR SHORTNESS OF BREATH 8.5 g 2   beclomethasone (QVAR  REDIHALER) 40 MCG/ACT inhaler Inhale 2 puffs into the lungs 2 (two) times daily. 1 each 0   buPROPion  (WELLBUTRIN  XL) 150 MG 24 hr tablet Take 1 tablet (150 mg total) by mouth daily. 30 tablet 3   Cholecalciferol (VITAMIN D3) 1.25 MG (50000 UT) CAPS Take 1 capsule (1.25 mg total) by mouth once a week. 12 capsule 1   clonazePAM  (KLONOPIN ) 0.5 MG tablet Take 1 tablet (0.5 mg total) by mouth 2 (two) times daily as needed for anxiety. 60 tablet 0   Continuous Glucose Sensor (DEXCOM G7 SENSOR) MISC 1 Device by Does not apply route as directed. 9 each 3   CONTOUR NEXT TEST test strip USE TO TEST BLOOD SUGAR 6 TIMES A DAY 100 strip 3   Glucagon  (BAQSIMI  TWO PACK) 3 MG/DOSE POWD PLACE 1 DEVICE INTO THE NOSE AS DIRECTED 2 each 3   insulin  aspart (NOVOLOG ) 100 UNIT/ML injection Max  daily 100 units via OmniPod 100 mL 3   Insulin  Disposable Pump (OMNIPOD 5 G7 PODS, GEN 5,) MISC 1 Device by Does not apply route every other day. 135 each 3   levothyroxine  (SYNTHROID ) 200 MCG tablet Take 1 tablet (200 mcg total) by mouth daily. 90 tablet 1   meloxicam  (MOBIC ) 15 MG tablet Take 15 mg by mouth daily as needed for pain.     rosuvastatin  (CRESTOR ) 40 MG tablet Take 1 tablet (40 mg total) by mouth daily. 90 tablet 3   tirzepatide  (MOUNJARO ) 7.5 MG/0.5ML Pen Inject 7.5 mg into the skin once a week. 9 mL 3   varenicline  (CHANTIX ) 1 MG tablet TAKE 1 TABLET BY MOUTH 2 TIMES A DAY 180 tablet 0   No facility-administered medications prior to visit.     Per HPI unless  specifically indicated in ROS section below Review of Systems  Constitutional:  Negative for fatigue and fever.  HENT:  Positive for sinus pressure. Negative for congestion.   Eyes:  Negative for pain.  Respiratory:  Negative for cough and shortness of breath.   Cardiovascular:  Negative for chest pain, palpitations and leg swelling.  Gastrointestinal:  Negative for abdominal pain.  Genitourinary:  Negative for dysuria and vaginal bleeding.  Musculoskeletal:  Negative for back pain.  Neurological:  Positive for dizziness and light-headedness. Negative for syncope and headaches.  Psychiatric/Behavioral:  Negative for dysphoric mood.    Objective:  BP 99/61   Pulse 94   Temp 98 F (36.7 C) (Oral)   Ht 5' 8 (1.727 m)   Wt 256 lb 4 oz (116.2 kg)   LMP 06/28/2015   SpO2 98%   BMI 38.96 kg/m   Wt Readings from Last 3 Encounters:  05/12/24 256 lb 4 oz (116.2 kg)  04/30/24 257 lb (116.6 kg)  04/16/24 266 lb 6 oz (120.8 kg)      Physical Exam Constitutional:      General: She is not in acute distress.    Appearance: Normal appearance. She is well-developed. She is not ill-appearing or toxic-appearing.  HENT:     Head: Normocephalic.     Right Ear: Hearing, tympanic membrane, ear canal and external ear normal. Tympanic membrane is not erythematous, retracted or bulging.     Left Ear: Hearing, tympanic membrane, ear canal and external ear normal. Tympanic membrane is not erythematous, retracted or bulging.     Nose: No mucosal edema or rhinorrhea.     Right Sinus: No maxillary sinus tenderness or frontal sinus tenderness.     Left Sinus: No maxillary sinus tenderness or frontal sinus tenderness.     Mouth/Throat:     Pharynx: Uvula midline.  Eyes:     General: Lids are normal. Lids are everted, no foreign bodies appreciated.     Conjunctiva/sclera: Conjunctivae normal.     Pupils: Pupils are equal, round, and reactive to light.  Neck:     Thyroid : No thyroid  mass or thyromegaly.      Vascular: No carotid bruit.     Trachea: Trachea normal.  Cardiovascular:     Rate and Rhythm: Normal rate and regular rhythm.     Pulses: Normal pulses.     Heart sounds: Normal heart sounds, S1 normal and S2 normal. No murmur heard.    No friction rub. No gallop.  Pulmonary:     Effort: Pulmonary effort is normal. No tachypnea or respiratory distress.     Breath sounds: Normal breath sounds. No decreased breath sounds,  wheezing, rhonchi or rales.  Abdominal:     General: Bowel sounds are normal.     Palpations: Abdomen is soft.     Tenderness: There is no abdominal tenderness.  Musculoskeletal:     Cervical back: Normal range of motion and neck supple.  Skin:    General: Skin is warm and dry.     Findings: No rash.  Neurological:     Mental Status: She is alert.  Psychiatric:        Mood and Affect: Mood is not anxious or depressed.        Speech: Speech normal.        Behavior: Behavior normal. Behavior is cooperative.        Thought Content: Thought content normal.        Judgment: Judgment normal.       Results for orders placed or performed in visit on 05/12/24  POCT glucose (manual entry)   Collection Time: 05/12/24  9:30 AM  Result Value Ref Range   POC Glucose 515 (A) 70 - 99 mg/dl    Assessment and Plan  Type 1 diabetes mellitus with complications (HCC) Assessment & Plan: Acute worsening of control in the last few days.  Possibly secondary to inhaled corticosteroid she started in the last 3 days.  She did feel as though she had a pump failure this morning but has changed out her pump.  She is able to bolus her insulin  through her pump which she did in the office.  She will follow this closely.  I did send a message to endocrinology for their recommendations. She will contact them if blood sugar remains elevated.  Orders: -     POCT glucose (manual entry)  Acute non-recurrent pansinusitis Assessment & Plan: Acute, ongoing viral infection now with  possible bacterial superinfection given pressure over sinuses and increased blood sugar. She states she is feeling better overall with the viral symptoms. If blood sugar not coming down with stopping the inhaled corticosteroid she will consider treatment team possible bacterial sinus infection with amoxicillin  500 mg 2 tablets twice daily x 10 days.  If any fever or nausea and vomiting given hypotension, have low threshold to go to ED.   GAD (generalized anxiety disorder)  MDD (major depressive disorder), recurrent episode, moderate (HCC) Assessment & Plan:  Patient reports she has been persistent about work adhering to 20 hours which has helped her anxiety and overwhelm.  She is continuing counseling. She feels that Wellbutrin  has not been significantly helpful and would like to wean off.  She states clonazepam  once a day as needed helps significantly.  She understands the tolerance building potential of this medication. Her daughter has an upcoming procedure within the next 2 weeks.  She would like to continue 20 hour/week work accommodation until May 27, 2024.  At that time she will increase to 30 hours/week until the next appointment in the beginning of February.   Hypotension, unspecified hypotension type Assessment & Plan: Acute, low blood pressure noted in office today.  Patient stated she first felt dizzy in our waiting room. She states that she has been staying well-hydrated and has no loss of fluids.  She states her urine is clear.  Her blood sugar has been elevated some in the last few days.  No clear signs of sepsis.  No fever.  Possible dehydration given several days of increased blood sugar.  Encouraged patient to drink liquids in office and blood pressure improved.  Considered ED visit  for potential IV fluids but patient states she would like to try to push liquids at home given no nausea and vomiting. Reviewed return precautions in detail. Patient took an [place] home instead  of driving herself.   Other orders -     Amoxicillin ; Take 2 capsules (1,000 mg total) by mouth 2 (two) times daily.  Dispense: 40 capsule; Refill: 0    Return in about 6 weeks (around 06/23/2024) for follow up mood/.   Greig Ring, MD

## 2024-05-12 NOTE — Patient Instructions (Signed)
 Stop inhaled steroid.  Will treat with antibiotics for possible sinus infection.  Call ENDO for recommendations on insulin  adjustment.

## 2024-05-12 NOTE — Progress Notes (Signed)
 Following patient's visit she became dizzy at the check out desk.  Staff alerted writer to her not feeling well.  Patient found walking with front desk staff.  She was assisted to a chair and given water .  Patient reported that she had low BP during her visit with Dr. Avelina but BP had come up with rest and water .  Spoke with Dr. Avelina, patient not safe to drive home.  She reached out to some of her neighbors to see if someone was available to pick her up.  No one was available, pt scheduled her self an [place] ride.  When [place] arrived, clinical research associate assisted pt to car for safety.  Encouraged her to call office with any concerns.

## 2024-05-12 NOTE — Assessment & Plan Note (Signed)
 Patient reports she has been persistent about work adhering to 20 hours which has helped her anxiety and overwhelm.  She is continuing counseling. She feels that Wellbutrin  has not been significantly helpful and would like to wean off.  She states clonazepam  once a day as needed helps significantly.  She understands the tolerance building potential of this medication. Her daughter has an upcoming procedure within the next 2 weeks.  She would like to continue 20 hour/week work accommodation until May 27, 2024.  At that time she will increase to 30 hours/week until the next appointment in the beginning of February.

## 2024-05-29 ENCOUNTER — Other Ambulatory Visit: Payer: Self-pay | Admitting: Family Medicine

## 2024-05-31 NOTE — Telephone Encounter (Signed)
 Last office visit 05/12/24 for DM, Pansinusitis, GAD, MDD and Hypotension.  Last refilled 04/30/2024 for 1 inhaler with no refills.  Refill?

## 2024-06-10 ENCOUNTER — Other Ambulatory Visit: Payer: Self-pay | Admitting: Internal Medicine

## 2024-06-10 ENCOUNTER — Other Ambulatory Visit: Payer: Self-pay | Admitting: Family Medicine

## 2024-06-10 NOTE — Telephone Encounter (Signed)
 Last office visit 05/12/2024 for DM.  Last refilled 05/06/2024 for #60 with no refills. Next appt: 06/23/24 for follow up mood.

## 2024-06-18 ENCOUNTER — Encounter: Payer: Self-pay | Admitting: Podiatry

## 2024-06-18 ENCOUNTER — Encounter: Payer: Self-pay | Admitting: Family Medicine

## 2024-06-18 ENCOUNTER — Encounter: Payer: Self-pay | Admitting: Internal Medicine

## 2024-06-18 ENCOUNTER — Other Ambulatory Visit: Payer: Self-pay

## 2024-06-18 DIAGNOSIS — R062 Wheezing: Secondary | ICD-10-CM

## 2024-06-18 DIAGNOSIS — E1169 Type 2 diabetes mellitus with other specified complication: Secondary | ICD-10-CM

## 2024-06-18 MED ORDER — VITAMIN D3 1.25 MG (50000 UT) PO CAPS: 1.0000 | ORAL_CAPSULE | ORAL | 1 refills | Status: DC

## 2024-06-18 MED ORDER — QVAR REDIHALER 40 MCG/ACT IN AERB: 2.0000 | INHALATION_SPRAY | Freq: Two times a day (BID) | RESPIRATORY_TRACT | 2 refills | Status: DC

## 2024-06-18 MED ORDER — LEVOTHYROXINE SODIUM 200 MCG PO TABS: 200.0000 ug | ORAL_TABLET | Freq: Every day | ORAL | 3 refills | Status: AC

## 2024-06-18 MED ORDER — BAQSIMI TWO PACK 3 MG/DOSE NA POWD: 3.0000 mg | NASAL | 1 refills | Status: AC

## 2024-06-18 MED ORDER — ACCU-CHEK GUIDE W/DEVICE KIT: 1.0000 | PACK | Freq: Every day | 0 refills | Status: AC

## 2024-06-18 MED ORDER — INSULIN ASPART 100 UNIT/ML IJ SOLN: INTRAMUSCULAR | 3 refills | Status: AC

## 2024-06-18 MED ORDER — ROSUVASTATIN CALCIUM 40 MG PO TABS: 40.0000 mg | ORAL_TABLET | Freq: Every day | ORAL | 3 refills | Status: AC

## 2024-06-18 MED ORDER — ACCU-CHEK GUIDE TEST VI STRP: 1.0000 | ORAL_STRIP | Freq: Every day | 12 refills | Status: AC

## 2024-06-18 MED ORDER — TIRZEPATIDE 10 MG/0.5ML ~~LOC~~ SOAJ: 10.0000 mg | SUBCUTANEOUS | 3 refills | Status: AC

## 2024-06-18 MED ORDER — MELOXICAM 15 MG PO TABS: 15.0000 mg | ORAL_TABLET | Freq: Every day | ORAL | 2 refills | Status: AC | PRN

## 2024-06-18 MED ORDER — DEXCOM G7 SENSOR MISC: 1.0000 | 3 refills | Status: AC

## 2024-06-18 MED ORDER — BUPROPION HCL ER (XL) 150 MG PO TB24: 150.0000 mg | ORAL_TABLET | Freq: Every day | ORAL | 3 refills | Status: DC

## 2024-06-18 MED ORDER — OMNIPOD 5 G7 PODS (GEN 5) MISC: 1.0000 | 3 refills | Status: AC

## 2024-06-18 MED ORDER — ALBUTEROL SULFATE HFA 108 (90 BASE) MCG/ACT IN AERS: 2.0000 | INHALATION_SPRAY | Freq: Four times a day (QID) | RESPIRATORY_TRACT | 2 refills | Status: AC | PRN

## 2024-06-18 MED ORDER — VARENICLINE TARTRATE 1 MG PO TABS: 1.0000 mg | ORAL_TABLET | Freq: Two times a day (BID) | ORAL | 0 refills | Status: DC

## 2024-06-18 NOTE — Telephone Encounter (Signed)
 Please advise on mounjaro  increase.

## 2024-06-23 ENCOUNTER — Encounter: Payer: Self-pay | Admitting: Family Medicine

## 2024-06-23 ENCOUNTER — Ambulatory Visit: Admitting: Family Medicine

## 2024-06-23 VITALS — BP 120/78 | HR 88 | Temp 98.3°F | Ht 68.0 in | Wt 273.0 lb

## 2024-06-23 DIAGNOSIS — F331 Major depressive disorder, recurrent, moderate: Secondary | ICD-10-CM | POA: Diagnosis not present

## 2024-06-23 DIAGNOSIS — F411 Generalized anxiety disorder: Secondary | ICD-10-CM | POA: Diagnosis not present

## 2024-06-23 NOTE — Progress Notes (Signed)
 "   Patient ID: Tanya Graves, female    DOB: 05-Oct-1965, 59 y.o.   MRN: [id number]  This visit was conducted in person.  BP 120/78   Pulse 88   Temp 98.3 F (36.8 C) (Oral)   Ht 5' 8 (1.727 m)   Wt 273 lb (123.8 kg)   LMP 06/28/2015   SpO2 97%   BMI 41.51 kg/m    CC:  Chief Complaint  Patient presents with   Medical Management of Chronic Issues    Pt here for F/u on her disability claim    Subjective:   HPI: Tanya Graves is a 59 y.o. female presenting on 06/23/2024 for Medical Management of Chronic Issues (Pt here for F/u on her disability claim)    She is having continued anxiety and stress associated with dealing with her child chronic health issues.  Overall gradual improvement. She continues to have spells of tearfulness  She is currently working 30 hours a week.. it is very hard to manage her expected work load in that time period.  She has been able to remain away from smoking even without Chantix .   She is  not on Wellbutrin  XL 150 mg daily... this caused worsening symptoms.  Using clonazepam   0.5 mg 1- times daily prn for anxiety.  She is not able to go back to 40 hours  She is doing counseling one day a week... this is very helpful.  She is working on setting boundries.  She expects increase expected given upcoming health testing for daughters health issue. Daughter still vomiting intermittently. Has doctor vistis with daughters daughter.  Plan return to 40 hours per week +**son  08/02/2024.. next OV with me with be 07/28/2024     06/23/2024   10:12 AM 05/12/2024    9:17 AM 04/30/2024   10:20 AM  Depression screen PHQ 2/9  Decreased Interest 0 1 2  Down, Depressed, Hopeless 1 1 1   PHQ - 2 Score 1 2 3   Altered sleeping 3 2 2   Tired, decreased energy 2 2 1   Change in appetite 2 3 2   Feeling bad or failure about yourself  0 0 0  Trouble concentrating 0 1 2  Moving slowly or fidgety/restless 0 0 0  Suicidal thoughts 0 0 0  PHQ-9 Score 8 10 10   Difficult  doing work/chores Somewhat difficult Somewhat difficult Somewhat difficult      06/23/2024   10:13 AM 05/12/2024    9:17 AM 04/30/2024   10:20 AM 04/21/2024    1:20 PM  GAD 7 : Generalized Anxiety Score  Nervous, Anxious, on Edge 3 2  3  2    Control/stop worrying 1 2  2  2    Worry too much - different things 2 1  1  2    Trouble relaxing 0 1  1  1    Restless 0 0  0  0   Easily annoyed or irritable 3 2  1   0   Afraid - awful might happen 0 0  1  0   Total GAD 7 Score 9 8 9 7   Anxiety Difficulty Somewhat difficult Somewhat difficult Somewhat difficult Very difficult     Data saved with a previous flowsheet row definition      She  fell on the way out of her house this AM, scratched her right forearm. Landed on left hip    BP has improved. BP Readings from Last 3 Encounters:  06/23/24 120/78  05/12/24 99/61  04/30/24 110/66         Relevant past medical, surgical, family and social history reviewed and updated as indicated. Interim medical history since our last visit reviewed. Allergies and medications reviewed and updated. Outpatient Medications Prior to Visit  Medication Sig Dispense Refill   albuterol  (VENTOLIN  HFA) 108 (90 Base) MCG/ACT inhaler Inhale 2 puffs into the lungs every 6 (six) hours as needed for wheezing or shortness of breath. 8.5 g 2   Blood Glucose Monitoring Suppl (ACCU-CHEK GUIDE) w/Device KIT 1 Device by Does not apply route daily in the afternoon. 1 kit 0   clonazePAM  (KLONOPIN ) 0.5 MG tablet TAKE 1 TABLET BY MOUTH 2 TIMES A DAY AS NEEDED FOR ANXIETY 60 tablet 0   Continuous Glucose Sensor (DEXCOM G7 SENSOR) MISC 1 Device by Does not apply route as directed. 9 each 3   Glucagon  (BAQSIMI  TWO PACK) 3 MG/DOSE POWD 3 mg by Other route as directed. As needed for severe hypoglycemia 2 each 1   glucose blood (ACCU-CHEK GUIDE TEST) test strip 1 each by Other route daily in the afternoon. Use as instructed 100 each 12   insulin  aspart (NOVOLOG ) 100 UNIT/ML  injection Max daily 100 units via OmniPod 100 mL 3   Insulin  Disposable Pump (OMNIPOD 5 G7 PODS, GEN 5,) MISC 1 Device by Does not apply route every other day. 45 each 3   levothyroxine  (SYNTHROID ) 200 MCG tablet Take 1 tablet (200 mcg total) by mouth daily. 90 tablet 3   meloxicam  (MOBIC ) 15 MG tablet Take 1 tablet (15 mg total) by mouth daily as needed for pain. 30 tablet 2   rosuvastatin  (CRESTOR ) 40 MG tablet Take 1 tablet (40 mg total) by mouth daily. 90 tablet 3   tirzepatide  (MOUNJARO ) 10 MG/0.5ML Pen Inject 10 mg into the skin once a week. 6 mL 3   amoxicillin  (AMOXIL ) 500 MG capsule Take 2 capsules (1,000 mg total) by mouth 2 (two) times daily. (Patient not taking: Reported on 06/23/2024) 40 capsule 0   beclomethasone (QVAR  REDIHALER) 40 MCG/ACT inhaler Inhale 2 puffs into the lungs 2 (two) times daily. (Patient not taking: Reported on 06/23/2024) 10.6 g 2   buPROPion  (WELLBUTRIN  XL) 150 MG 24 hr tablet Take 1 tablet (150 mg total) by mouth daily. (Patient not taking: Reported on 06/23/2024) 30 tablet 3   Cholecalciferol (VITAMIN D3) 1.25 MG (50000 UT) CAPS Take 1 capsule (1.25 mg total) by mouth once a week. (Patient not taking: Reported on 06/23/2024) 12 capsule 1   varenicline  (CHANTIX ) 1 MG tablet Take 1 tablet (1 mg total) by mouth 2 (two) times daily. (Patient not taking: Reported on 06/23/2024) 180 tablet 0   No facility-administered medications prior to visit.     Per HPI unless specifically indicated in ROS section below Review of Systems  Constitutional:  Negative for fatigue and fever.  HENT:  Negative for congestion.   Eyes:  Negative for pain.  Respiratory:  Negative for cough and shortness of breath.   Cardiovascular:  Negative for chest pain, palpitations and leg swelling.  Gastrointestinal:  Negative for abdominal pain.  Genitourinary:  Negative for dysuria and vaginal bleeding.  Musculoskeletal:  Negative for back pain.  Neurological:  Negative for syncope,  light-headedness and headaches.  Psychiatric/Behavioral:  Negative for dysphoric mood.    Objective:  BP 120/78   Pulse 88   Temp 98.3 F (36.8 C) (Oral)   Ht 5' 8 (1.727 m)   Wt 273 lb (123.8 kg)  LMP 06/28/2015   SpO2 97%   BMI 41.51 kg/m   Wt Readings from Last 3 Encounters:  06/23/24 273 lb (123.8 kg)  05/12/24 256 lb 4 oz (116.2 kg)  04/30/24 257 lb (116.6 kg)      Physical Exam Constitutional:      General: She is not in acute distress.    Appearance: Normal appearance. She is well-developed. She is not ill-appearing or toxic-appearing.  HENT:     Head: Normocephalic.     Right Ear: Hearing, tympanic membrane, ear canal and external ear normal. Tympanic membrane is not erythematous, retracted or bulging.     Left Ear: Hearing, tympanic membrane, ear canal and external ear normal. Tympanic membrane is not erythematous, retracted or bulging.     Nose: No mucosal edema or rhinorrhea.     Right Sinus: No maxillary sinus tenderness or frontal sinus tenderness.     Left Sinus: No maxillary sinus tenderness or frontal sinus tenderness.     Mouth/Throat:     Pharynx: Uvula midline.  Eyes:     General: Lids are normal. Lids are everted, no foreign bodies appreciated.     Conjunctiva/sclera: Conjunctivae normal.     Pupils: Pupils are equal, round, and reactive to light.  Neck:     Thyroid : No thyroid  mass or thyromegaly.     Vascular: No carotid bruit.     Trachea: Trachea normal.  Cardiovascular:     Rate and Rhythm: Normal rate and regular rhythm.     Pulses: Normal pulses.     Heart sounds: Normal heart sounds, S1 normal and S2 normal. No murmur heard.    No friction rub. No gallop.  Pulmonary:     Effort: Pulmonary effort is normal. No tachypnea or respiratory distress.     Breath sounds: Normal breath sounds. No decreased breath sounds, wheezing, rhonchi or rales.  Abdominal:     General: Bowel sounds are normal.     Palpations: Abdomen is soft.     Tenderness:  There is no abdominal tenderness.  Musculoskeletal:     Right shoulder: Normal.     Left shoulder: Normal.     Right elbow: Normal.     Left elbow: Normal.     Right forearm: Tenderness present. No bony tenderness.     Left forearm: Normal.     Cervical back: Normal, normal range of motion and neck supple.     Thoracic back: Normal.     Lumbar back: Normal.     Left hip: Normal. No tenderness or bony tenderness. Normal range of motion.     Left upper leg: Tenderness present. No swelling, edema, deformity or bony tenderness.  Skin:    General: Skin is warm and dry.     Findings: No rash.     Comments:  Contusion and scrape on  right forearm  Neurological:     Mental Status: She is alert.  Psychiatric:        Mood and Affect: Mood is not anxious or depressed.        Speech: Speech normal.        Behavior: Behavior normal. Behavior is cooperative.        Thought Content: Thought content normal.        Judgment: Judgment normal.       Results for orders placed or performed in visit on 05/12/24  POCT glucose (manual entry)   Collection Time: 05/12/24  9:30 AM  Result Value Ref Range  POC Glucose 515 (A) 70 - 99 mg/dl    Assessment and Plan  MDD (major depressive disorder), recurrent episode, moderate (HCC) Assessment & Plan:  Chronic, slow but gradual improvement. Her daughter has an upcoming procedure within the next 4 weeks.  She would like to continue 30 hour/week work accommodation until  August 02, 2024. She will follow up to consider increase to full time on March 4, We did discuss FMLA should be available for her to have excused absences for doctors appointments with her daughter once she is full-time.     GAD (generalized anxiety disorder) Assessment & Plan:  Chronic, minimal improvement. Unable to tolerate Wellbutrin  but initially helpful.. has now weaned off.  Continuing counseling which has been very helpful for her, once weekly. Clonazepam  is helpful with  controlling anxiety but may be contributing to some occasional dizziness.  She will try half a tablet twice daily as needed anxiety. Continue relaxation and stress reduction.     No follow-ups on file.   Greig Ring, MD  "

## 2024-06-23 NOTE — Telephone Encounter (Signed)
 Forms received  Will fax to [employer] at [phone] when completed  Will reach out to the patient when copy for their records is ready to pick up.  Forms placed in providers box for review.

## 2024-06-23 NOTE — Assessment & Plan Note (Addendum)
"   Chronic, minimal improvement. Unable to tolerate Wellbutrin  but initially helpful.. has now weaned off.  Continuing counseling which has been very helpful for her, once weekly. Clonazepam  is helpful with controlling anxiety but may be contributing to some occasional dizziness.  She will try half a tablet twice daily as needed anxiety. Continue relaxation and stress reduction. "

## 2024-06-23 NOTE — Assessment & Plan Note (Addendum)
"   Chronic, slow but gradual improvement. Her daughter has an upcoming procedure within the next 4 weeks.  She would like to continue 30 hour/week work accommodation until  August 02, 2024. She will follow up to consider increase to full time on March 4, We did discuss FMLA should be available for her to have excused absences for doctors appointments with her daughter once she is full-time.   "

## 2024-06-25 ENCOUNTER — Ambulatory Visit: Admitting: Podiatry

## 2024-06-25 ENCOUNTER — Encounter: Payer: Self-pay | Admitting: Family Medicine

## 2024-06-25 ENCOUNTER — Ambulatory Visit (INDEPENDENT_AMBULATORY_CARE_PROVIDER_SITE_OTHER)

## 2024-06-25 DIAGNOSIS — M7751 Other enthesopathy of right foot: Secondary | ICD-10-CM

## 2024-06-25 NOTE — Progress Notes (Signed)
 "  Chief Complaint  Patient presents with   Foot Pain    Pt is here due to right foot pain to the top of the foot she states the pain is off and on and has been going on for quite a while, she states no injury. Also complains of right great toenail, states she had a bruise under the toenail, nail grew out the broke off, states she cut the nail down, states some discomfort, she is a diabetic and has neuropathy.    HPI: 59 y.o. femalepresenting for 2 separate complaints.  First the patient states that she has been experiencing some pain for the past 2 weeks to the right foot.  No history of injury.  She has also noticed discoloration to the right hallux nail plate.  She has a history of losing the toenail due to trauma and just recently she noticed that a portion of the nail was detached.  She trimmed it back and did have some bleeding.  She is diabetic and would like to have it evaluated  Past Medical History:  Diagnosis Date   Arthritis    Diabetes mellitus without complication (HCC)    type 2   Thyroid  disease     Past Surgical History:  Procedure Laterality Date   APPENDECTOMY  1993   CALCANEAL OSTEOTOMY Left 07/17/2016   Procedure: CALCANEAL OSTEOTOMY;  Surgeon: Kayla Pinal, MD;  Location: [facility] ORS;  Service: Orthopedics;  Laterality: Left;   COLONOSCOPY  12/18/2018   found 1 3mm polyp, going back in 5 years    COLONOSCOPY WITH PROPOFOL  N/A 12/18/2018   Procedure: COLONOSCOPY WITH PROPOFOL ;  Surgeon: Therisa Bi, MD;  Location: [facility] ENDOSCOPY;  Service: Gastroenterology;  Laterality: N/A;   IRRIGATION AND DEBRIDEMENT ABSCESS N/A 01/24/2015   Procedure: IRRIGATION AND DEBRIDEMENT ABSCESS/GROIN ABSCESS;  Surgeon: Lonni Brands, MD;  Location: [facility] ORS;  Service: General;  Laterality: N/A;   KNEE ARTHROSCOPY W/ MENISCAL REPAIR Right 2008   ROTATOR CUFF REPAIR Right 2015   TONSILLECTOMY  1998    Allergies[1]    RT great toenail 06/25/2024  Physical Exam: General: The  patient is alert and oriented x3 in no acute distress.  Dermatology: Skin is warm, dry and supple bilateral lower extremities.  Partial loss of right hallux nail plate noted.  Stable.  No indication of infection.  Vascular: Palpable pedal pulses bilaterally. Capillary refill within normal limits.  No appreciable edema.  No erythema.  Neurological: Grossly intact via light touch  Musculoskeletal Exam: No pedal deformities noted.  There is some tenderness to palpation along the first TMT of the right foot  Radiographic Exam RT foot 06/25/2024:  Normal osseous mineralization.  Advanced degenerative changes with arthritis noted to the TMT of the right foot.  No acute fractures identified.  Chronic healed fracture of the fifth metatarsal  Assessment/Plan of Care: 1.  Arthritis TMT right foot 2.  Traumatic partial loss of toenail right great toe  -Patient evaluated.  X-rays reviewed -The pain to the right foot has only been going on for about 2 weeks and there has been some improvement.  Continue conservative care including good supportive arch supports and shoes.  Advised against going barefoot -Recommend OTC antifungal nail rejuvenation solution daily to the right great toe as it grows out -Return to clinic PRN     Thresa EMERSON Sar, DPM [facility]  Dr. Thresa EMERSON Sar, DPM    [address]  [place], [place] [address]                Office [phone]  Fax [phone]        [1] No Known Allergies  "

## 2024-06-29 ENCOUNTER — Telehealth: Payer: Self-pay | Admitting: Pharmacy Technician

## 2024-06-29 ENCOUNTER — Other Ambulatory Visit (HOSPITAL_COMMUNITY): Payer: Self-pay

## 2024-06-29 NOTE — Telephone Encounter (Signed)
 Completed forms received and faxed to [employer] at [phone]  Copy sent to scan  Copy at the front desk for patient to pick up for her records. Left MyChart message letting patient know.

## 2024-06-29 NOTE — Telephone Encounter (Signed)
 Pharmacy Patient Advocate Encounter   Received notification from [employer] that prior authorization for Mounjaro  10MG /0.5ML auto-injectors  is required/requested.   Insurance verification completed.   The patient is insured through [employer].   Per test claim: PA required; PA submitted to above mentioned insurance via Latent Key/confirmation #/EOC [id number] Status is pending

## 2024-07-28 ENCOUNTER — Ambulatory Visit: Admitting: Family Medicine

## 2024-08-12 ENCOUNTER — Ambulatory Visit: Admitting: Internal Medicine
